# Patient Record
Sex: Male | Born: 1935 | Race: Black or African American | Hispanic: No | Smoking: Light tobacco smoker
Health system: Southern US, Community
[De-identification: ages and names within clinical notes are randomized; demographics above are authoritative.]

## PROBLEM LIST (undated history)

## (undated) DIAGNOSIS — I2510 Atherosclerotic heart disease of native coronary artery without angina pectoris: Secondary | ICD-10-CM

## (undated) DIAGNOSIS — IMO0002 Reserved for concepts with insufficient information to code with codable children: Secondary | ICD-10-CM

## (undated) DIAGNOSIS — R21 Rash and other nonspecific skin eruption: Secondary | ICD-10-CM

## (undated) DIAGNOSIS — H548 Legal blindness, as defined in USA: Secondary | ICD-10-CM

## (undated) DIAGNOSIS — F329 Major depressive disorder, single episode, unspecified: Secondary | ICD-10-CM

## (undated) DIAGNOSIS — H547 Unspecified visual loss: Secondary | ICD-10-CM

## (undated) DIAGNOSIS — F3289 Other specified depressive episodes: Secondary | ICD-10-CM

## (undated) DIAGNOSIS — R413 Other amnesia: Secondary | ICD-10-CM

## (undated) DIAGNOSIS — L723 Sebaceous cyst: Secondary | ICD-10-CM

## (undated) DIAGNOSIS — I10 Essential (primary) hypertension: Secondary | ICD-10-CM

## (undated) DIAGNOSIS — J4489 Other specified chronic obstructive pulmonary disease: Secondary | ICD-10-CM

## (undated) DIAGNOSIS — E1151 Type 2 diabetes mellitus with diabetic peripheral angiopathy without gangrene: Secondary | ICD-10-CM

## (undated) DIAGNOSIS — M2550 Pain in unspecified joint: Secondary | ICD-10-CM

## (undated) DIAGNOSIS — E559 Vitamin D deficiency, unspecified: Secondary | ICD-10-CM

## (undated) DIAGNOSIS — K2100 Gastro-esophageal reflux disease with esophagitis, without bleeding: Secondary | ICD-10-CM

## (undated) DIAGNOSIS — E785 Hyperlipidemia, unspecified: Secondary | ICD-10-CM

## (undated) DIAGNOSIS — R002 Palpitations: Secondary | ICD-10-CM

## (undated) DIAGNOSIS — J449 Chronic obstructive pulmonary disease, unspecified: Secondary | ICD-10-CM

## (undated) DIAGNOSIS — E1149 Type 2 diabetes mellitus with other diabetic neurological complication: Secondary | ICD-10-CM

## (undated) DIAGNOSIS — R05 Cough: Secondary | ICD-10-CM

## (undated) DIAGNOSIS — M48062 Spinal stenosis, lumbar region with neurogenic claudication: Secondary | ICD-10-CM

## (undated) DIAGNOSIS — R51 Headache: Secondary | ICD-10-CM

## (undated) DIAGNOSIS — K5900 Constipation, unspecified: Secondary | ICD-10-CM

## (undated) DIAGNOSIS — Z8719 Personal history of other diseases of the digestive system: Secondary | ICD-10-CM

## (undated) DIAGNOSIS — R059 Cough, unspecified: Secondary | ICD-10-CM

## (undated) DIAGNOSIS — F411 Generalized anxiety disorder: Secondary | ICD-10-CM

## (undated) DIAGNOSIS — E1165 Type 2 diabetes mellitus with hyperglycemia: Secondary | ICD-10-CM

## (undated) HISTORY — DX: Generalized anxiety disorder: F41.1

## (undated) HISTORY — DX: Essential (primary) hypertension: I10

## (undated) HISTORY — DX: Atherosclerotic heart disease of native coronary artery without angina pectoris: I25.10

## (undated) HISTORY — DX: Legal blindness, as defined in USA: H54.8

## (undated) HISTORY — DX: Rash and other nonspecific skin eruption: R21

## (undated) HISTORY — DX: Vitamin D deficiency, unspecified: E55.9

## (undated) HISTORY — DX: Hyperlipidemia, unspecified: E78.5

## (undated) HISTORY — DX: Cough, unspecified: R05.9

## (undated) HISTORY — DX: Other amnesia: R41.3

## (undated) HISTORY — DX: Type 2 diabetes mellitus with hyperglycemia: E11.65

## (undated) HISTORY — DX: Constipation, unspecified: K59.00

## (undated) HISTORY — DX: Gastro-esophageal reflux disease with esophagitis: K21.0

## (undated) HISTORY — DX: Other specified chronic obstructive pulmonary disease: J44.89

## (undated) HISTORY — DX: Cough: R05

## (undated) HISTORY — DX: Headache: R51

## (undated) HISTORY — DX: Palpitations: R00.2

## (undated) HISTORY — PX: EYE SURGERY: SHX253

## (undated) HISTORY — DX: Type 2 diabetes mellitus with diabetic peripheral angiopathy without gangrene: E11.51

## (undated) HISTORY — DX: Major depressive disorder, single episode, unspecified: F32.9

## (undated) HISTORY — DX: Spinal stenosis, lumbar region with neurogenic claudication: M48.062

## (undated) HISTORY — DX: Type 2 diabetes mellitus with other diabetic neurological complication: E11.49

## (undated) HISTORY — DX: Chronic obstructive pulmonary disease, unspecified: J44.9

## (undated) HISTORY — DX: Reserved for concepts with insufficient information to code with codable children: IMO0002

## (undated) HISTORY — PX: CORONARY ANGIOPLASTY WITH STENT PLACEMENT: SHX49

## (undated) HISTORY — PX: HERNIA REPAIR: SHX51

## (undated) HISTORY — DX: Other specified depressive episodes: F32.89

## (undated) HISTORY — DX: Gastro-esophageal reflux disease with esophagitis, without bleeding: K21.00

## (undated) HISTORY — DX: Pain in unspecified joint: M25.50

## (undated) HISTORY — DX: Sebaceous cyst: L72.3

---

## 1998-04-08 ENCOUNTER — Ambulatory Visit (HOSPITAL_COMMUNITY): Admission: RE | Admit: 1998-04-08 | Discharge: 1998-04-08 | Payer: Self-pay | Admitting: Cardiology

## 1998-04-09 ENCOUNTER — Ambulatory Visit (HOSPITAL_COMMUNITY): Admission: RE | Admit: 1998-04-09 | Discharge: 1998-04-09 | Payer: Self-pay | Admitting: Cardiology

## 1998-12-07 ENCOUNTER — Emergency Department (HOSPITAL_COMMUNITY): Admission: EM | Admit: 1998-12-07 | Discharge: 1998-12-07 | Payer: Self-pay | Admitting: Emergency Medicine

## 1998-12-23 ENCOUNTER — Ambulatory Visit (HOSPITAL_COMMUNITY): Admission: RE | Admit: 1998-12-23 | Discharge: 1998-12-23 | Payer: Self-pay | Admitting: Cardiology

## 1999-01-23 ENCOUNTER — Ambulatory Visit (HOSPITAL_COMMUNITY): Admission: RE | Admit: 1999-01-23 | Discharge: 1999-01-23 | Payer: Self-pay | Admitting: Cardiology

## 1999-01-23 ENCOUNTER — Encounter: Payer: Self-pay | Admitting: Cardiology

## 1999-09-09 ENCOUNTER — Encounter: Payer: Self-pay | Admitting: Emergency Medicine

## 1999-09-09 ENCOUNTER — Inpatient Hospital Stay (HOSPITAL_COMMUNITY): Admission: EM | Admit: 1999-09-09 | Discharge: 1999-09-12 | Payer: Self-pay | Admitting: Emergency Medicine

## 1999-09-12 ENCOUNTER — Encounter: Payer: Self-pay | Admitting: Cardiovascular Disease

## 1999-09-24 ENCOUNTER — Ambulatory Visit (HOSPITAL_COMMUNITY): Admission: RE | Admit: 1999-09-24 | Discharge: 1999-09-24 | Payer: Self-pay | Admitting: Cardiology

## 1999-09-24 ENCOUNTER — Encounter: Payer: Self-pay | Admitting: Cardiology

## 2000-04-29 ENCOUNTER — Emergency Department (HOSPITAL_COMMUNITY): Admission: EM | Admit: 2000-04-29 | Discharge: 2000-04-30 | Payer: Self-pay | Admitting: Emergency Medicine

## 2000-07-29 ENCOUNTER — Emergency Department (HOSPITAL_COMMUNITY): Admission: EM | Admit: 2000-07-29 | Discharge: 2000-07-29 | Payer: Self-pay | Admitting: Emergency Medicine

## 2000-10-27 ENCOUNTER — Ambulatory Visit (HOSPITAL_COMMUNITY): Admission: RE | Admit: 2000-10-27 | Discharge: 2000-10-27 | Payer: Self-pay | Admitting: Cardiology

## 2000-10-27 ENCOUNTER — Encounter: Payer: Self-pay | Admitting: Cardiology

## 2000-12-01 ENCOUNTER — Encounter: Payer: Self-pay | Admitting: Cardiology

## 2000-12-01 ENCOUNTER — Ambulatory Visit (HOSPITAL_COMMUNITY): Admission: RE | Admit: 2000-12-01 | Discharge: 2000-12-01 | Payer: Self-pay | Admitting: Cardiology

## 2000-12-03 ENCOUNTER — Encounter: Payer: Self-pay | Admitting: Cardiology

## 2000-12-03 ENCOUNTER — Encounter: Admission: RE | Admit: 2000-12-03 | Discharge: 2000-12-03 | Payer: Self-pay | Admitting: Cardiology

## 2001-02-16 ENCOUNTER — Ambulatory Visit (HOSPITAL_COMMUNITY): Admission: RE | Admit: 2001-02-16 | Discharge: 2001-02-16 | Payer: Self-pay | Admitting: Cardiology

## 2001-02-16 ENCOUNTER — Encounter: Payer: Self-pay | Admitting: Cardiology

## 2001-03-05 ENCOUNTER — Emergency Department (HOSPITAL_COMMUNITY): Admission: EM | Admit: 2001-03-05 | Discharge: 2001-03-05 | Payer: Self-pay | Admitting: Emergency Medicine

## 2001-05-04 ENCOUNTER — Ambulatory Visit (HOSPITAL_COMMUNITY): Admission: RE | Admit: 2001-05-04 | Discharge: 2001-05-04 | Payer: Self-pay | Admitting: Gastroenterology

## 2001-05-04 ENCOUNTER — Encounter (INDEPENDENT_AMBULATORY_CARE_PROVIDER_SITE_OTHER): Payer: Self-pay | Admitting: *Deleted

## 2001-05-30 ENCOUNTER — Emergency Department (HOSPITAL_COMMUNITY): Admission: EM | Admit: 2001-05-30 | Discharge: 2001-05-30 | Payer: Self-pay | Admitting: Emergency Medicine

## 2001-07-12 ENCOUNTER — Encounter: Admission: RE | Admit: 2001-07-12 | Discharge: 2001-07-12 | Payer: Self-pay | Admitting: Cardiology

## 2001-07-12 ENCOUNTER — Encounter: Payer: Self-pay | Admitting: Cardiology

## 2001-09-22 ENCOUNTER — Emergency Department (HOSPITAL_COMMUNITY): Admission: EM | Admit: 2001-09-22 | Discharge: 2001-09-22 | Payer: Self-pay | Admitting: Emergency Medicine

## 2001-09-22 ENCOUNTER — Encounter: Payer: Self-pay | Admitting: Emergency Medicine

## 2002-02-05 ENCOUNTER — Emergency Department (HOSPITAL_COMMUNITY): Admission: EM | Admit: 2002-02-05 | Discharge: 2002-02-05 | Payer: Self-pay

## 2002-09-09 ENCOUNTER — Encounter: Payer: Self-pay | Admitting: Emergency Medicine

## 2002-09-09 ENCOUNTER — Emergency Department (HOSPITAL_COMMUNITY): Admission: EM | Admit: 2002-09-09 | Discharge: 2002-09-09 | Payer: Self-pay | Admitting: Emergency Medicine

## 2003-04-23 ENCOUNTER — Encounter: Payer: Self-pay | Admitting: *Deleted

## 2003-04-23 ENCOUNTER — Ambulatory Visit (HOSPITAL_COMMUNITY): Admission: RE | Admit: 2003-04-23 | Discharge: 2003-04-23 | Payer: Self-pay | Admitting: *Deleted

## 2003-08-31 ENCOUNTER — Emergency Department (HOSPITAL_COMMUNITY): Admission: EM | Admit: 2003-08-31 | Discharge: 2003-09-01 | Payer: Self-pay | Admitting: Emergency Medicine

## 2003-09-01 ENCOUNTER — Encounter: Payer: Self-pay | Admitting: *Deleted

## 2003-12-22 ENCOUNTER — Emergency Department (HOSPITAL_COMMUNITY): Admission: EM | Admit: 2003-12-22 | Discharge: 2003-12-22 | Payer: Self-pay | Admitting: Emergency Medicine

## 2004-09-10 ENCOUNTER — Ambulatory Visit (HOSPITAL_COMMUNITY): Admission: RE | Admit: 2004-09-10 | Discharge: 2004-09-10 | Payer: Self-pay | Admitting: Gastroenterology

## 2004-10-18 ENCOUNTER — Emergency Department (HOSPITAL_COMMUNITY): Admission: EM | Admit: 2004-10-18 | Discharge: 2004-10-19 | Payer: Self-pay | Admitting: Emergency Medicine

## 2005-08-24 ENCOUNTER — Emergency Department (HOSPITAL_COMMUNITY): Admission: EM | Admit: 2005-08-24 | Discharge: 2005-08-24 | Payer: Self-pay | Admitting: Emergency Medicine

## 2007-02-18 ENCOUNTER — Ambulatory Visit (HOSPITAL_COMMUNITY): Admission: RE | Admit: 2007-02-18 | Discharge: 2007-02-18 | Payer: Self-pay | Admitting: Cardiology

## 2007-02-21 ENCOUNTER — Ambulatory Visit (HOSPITAL_COMMUNITY): Admission: RE | Admit: 2007-02-21 | Discharge: 2007-02-21 | Payer: Self-pay | Admitting: Cardiology

## 2008-02-01 ENCOUNTER — Encounter: Admission: RE | Admit: 2008-02-01 | Discharge: 2008-02-01 | Payer: Self-pay | Admitting: Cardiology

## 2008-04-27 ENCOUNTER — Ambulatory Visit (HOSPITAL_COMMUNITY): Admission: RE | Admit: 2008-04-27 | Discharge: 2008-04-27 | Payer: Self-pay | Admitting: Cardiology

## 2008-06-14 ENCOUNTER — Inpatient Hospital Stay (HOSPITAL_COMMUNITY): Admission: RE | Admit: 2008-06-14 | Discharge: 2008-06-15 | Payer: Self-pay | Admitting: *Deleted

## 2009-01-04 ENCOUNTER — Emergency Department (HOSPITAL_COMMUNITY): Admission: EM | Admit: 2009-01-04 | Discharge: 2009-01-04 | Payer: Self-pay | Admitting: *Deleted

## 2009-02-05 ENCOUNTER — Encounter: Admission: RE | Admit: 2009-02-05 | Discharge: 2009-02-05 | Payer: Self-pay | Admitting: Orthopedic Surgery

## 2009-04-05 ENCOUNTER — Encounter (HOSPITAL_COMMUNITY): Admission: RE | Admit: 2009-04-05 | Discharge: 2009-07-04 | Payer: Self-pay | Admitting: Cardiology

## 2009-12-09 LAB — HM COLONOSCOPY

## 2009-12-13 ENCOUNTER — Encounter (HOSPITAL_COMMUNITY): Admission: RE | Admit: 2009-12-13 | Discharge: 2010-03-14 | Payer: Self-pay | Admitting: Cardiology

## 2010-01-17 ENCOUNTER — Encounter: Admission: RE | Admit: 2010-01-17 | Discharge: 2010-01-17 | Payer: Self-pay | Admitting: Orthopedic Surgery

## 2010-08-22 ENCOUNTER — Emergency Department (HOSPITAL_COMMUNITY): Admission: EM | Admit: 2010-08-22 | Discharge: 2010-08-22 | Payer: Self-pay | Admitting: Emergency Medicine

## 2010-09-25 ENCOUNTER — Encounter: Admission: RE | Admit: 2010-09-25 | Discharge: 2010-09-25 | Payer: Self-pay | Admitting: Orthopedic Surgery

## 2010-12-28 ENCOUNTER — Encounter: Payer: Self-pay | Admitting: Cardiology

## 2011-03-18 LAB — LIPID PANEL
HDL: 31 mg/dL — ABNORMAL LOW (ref >39)
Triglycerides: 106 mg/dL (ref <150)
VLDL: 21 mg/dL (ref 0–40)

## 2011-03-18 LAB — CBC
HCT: 38.5 % — ABNORMAL LOW (ref 39.0–52.0)
Hemoglobin: 13.2 g/dL (ref 13.0–17.0)
MCHC: 34.2 g/dL (ref 30.0–36.0)
RBC: 4.27 MIL/uL (ref 4.22–5.81)

## 2011-03-18 LAB — HEPATIC FUNCTION PANEL
Albumin: 4 g/dL (ref 3.5–5.2)
Alkaline Phosphatase: 82 U/L (ref 39–117)
Bilirubin, Direct: <0.1 mg/dL (ref 0.0–0.3)
Total Bilirubin: 0.8 mg/dL (ref 0.3–1.2)

## 2011-03-18 LAB — BASIC METABOLIC PANEL
CO2: 29 mEq/L (ref 19–32)
Calcium: 9.7 mg/dL (ref 8.4–10.5)
Chloride: 103 mEq/L (ref 96–112)
GFR calc Af Amer: >60 mL/min (ref >60)
Potassium: 4.5 mEq/L (ref 3.5–5.1)
Sodium: 138 mEq/L (ref 135–145)

## 2011-03-18 LAB — PROTIME-INR: Prothrombin Time: 13.8 seconds (ref 11.6–15.2)

## 2011-03-18 LAB — HEMOGLOBIN A1C: Hgb A1c MFr Bld: 7.2 % — ABNORMAL HIGH (ref 4.6–6.1)

## 2011-03-23 LAB — POCT I-STAT, CHEM 8
Creatinine, Ser: 1 mg/dL (ref 0.4–1.5)
Glucose, Bld: 104 mg/dL — ABNORMAL HIGH (ref 70–99)
Hemoglobin: 13.3 g/dL (ref 13.0–17.0)
Sodium: 141 mEq/L (ref 135–145)
TCO2: 24 mmol/L (ref 0–100)

## 2011-03-23 LAB — URINALYSIS, ROUTINE W REFLEX MICROSCOPIC
Bilirubin Urine: NEGATIVE
Hgb urine dipstick: NEGATIVE
Protein, ur: NEGATIVE mg/dL
Urobilinogen, UA: 1 mg/dL (ref 0.0–1.0)

## 2011-04-07 ENCOUNTER — Observation Stay (HOSPITAL_COMMUNITY)
Admission: RE | Admit: 2011-04-07 | Discharge: 2011-04-08 | Disposition: A | Discharge status: Home / Self Care | Payer: Medicare Other | Source: Ambulatory Visit | Attending: Cardiology | Admitting: Cardiology

## 2011-04-07 DIAGNOSIS — R079 Chest pain, unspecified: Secondary | ICD-10-CM | POA: Insufficient documentation

## 2011-04-07 DIAGNOSIS — I2510 Atherosclerotic heart disease of native coronary artery without angina pectoris: Principal | ICD-10-CM | POA: Insufficient documentation

## 2011-04-07 DIAGNOSIS — E119 Type 2 diabetes mellitus without complications: Secondary | ICD-10-CM | POA: Insufficient documentation

## 2011-04-07 DIAGNOSIS — Z9861 Coronary angioplasty status: Secondary | ICD-10-CM | POA: Insufficient documentation

## 2011-04-07 DIAGNOSIS — H543 Unqualified visual loss, both eyes: Secondary | ICD-10-CM | POA: Insufficient documentation

## 2011-04-07 DIAGNOSIS — R0602 Shortness of breath: Secondary | ICD-10-CM | POA: Insufficient documentation

## 2011-04-07 DIAGNOSIS — I10 Essential (primary) hypertension: Secondary | ICD-10-CM | POA: Insufficient documentation

## 2011-04-07 DIAGNOSIS — E7800 Pure hypercholesterolemia, unspecified: Secondary | ICD-10-CM | POA: Insufficient documentation

## 2011-04-07 LAB — GLUCOSE, CAPILLARY: Glucose-Capillary: 180 mg/dL — ABNORMAL HIGH (ref 70–99)

## 2011-04-07 LAB — PLATELET INHIBITION P2Y12
P2Y12 % Inhibition: 15 %
Platelet Function  P2Y12: 306 [PRU] (ref 194–418)

## 2011-04-08 LAB — CBC
HCT: 36 % — ABNORMAL LOW (ref 39.0–52.0)
Hemoglobin: 11.9 g/dL — ABNORMAL LOW (ref 13.0–17.0)
MCH: 29.9 pg (ref 26.0–34.0)
MCHC: 33.1 g/dL (ref 30.0–36.0)

## 2011-04-08 LAB — BASIC METABOLIC PANEL
CO2: 23 mEq/L (ref 19–32)
Calcium: 9.5 mg/dL (ref 8.4–10.5)
Creatinine, Ser: 1.04 mg/dL (ref 0.4–1.5)
Glucose, Bld: 161 mg/dL — ABNORMAL HIGH (ref 70–99)
Sodium: 137 mEq/L (ref 135–145)

## 2011-04-09 NOTE — Cardiovascular Report (Signed)
NAME:  Eddie Graves, Eddie Graves              ACCOUNT NO.:  [id number]  MEDICAL RECORD NO.:  [id number]           PATIENT TYPE:  O  LOCATION:  6523                         FACILITY:  [facility]  PHYSICIAN:  Eldredge Veldhuizen N. Sharyn Lull, M.D. DATE OF BIRTH:  Oct 05, 1931  DATE OF PROCEDURE:  04/07/2011 DATE OF DISCHARGE:                           CARDIAC CATHETERIZATION   PROCEDURES: 1. Left cardiac catheterization with selective left and right coronary     angiography, left ventriculography via right groin using Judkins     technique. 2. Successful measurement of fractional flow reserve using [place]     PrimeWire. 3. Successful deployment of Xience Prime drug-eluting stent, 3.0 x 38     mm long in proximal and mid left anterior descending. 4. Successful closure of arteriotomy using ProGlide Perclose without     complications.  INDICATIONS FOR PROCEDURE:  Eddie Graves is a 77 year old black male with past medical history significant for coronary artery disease status post PTCA stenting to RCA in the past, hypertension, non-insulin-dependent diabetes mellitus, hypercholesteremia, and blindness.  The patient is fairly active and complains of retrosternal chest tightness off and on associated with diaphoresis with minimal exertion.  States lately feels tired, weak, associated with short of breath with minimal exertion. Denies any palpitation, lightheadedness, or syncope.  Denies PND, orthopnea, or leg swelling.  Denies relation of chest pain to food, breathing, or movement.  Due to typical anginal chest pain, multiple risk factors, and significant CAD in the past, discussed with the patient regarding left cath, possible PTCA and stenting, its risks and benefits, i.e., death, MI, stroke, need for emergency CABG, local vascular complications, etc., and consented for the procedure.  PROCEDURE:  After obtaining informed consent, the patient was brought to the Cath Lab and was placed on fluoroscopy table.  Right  groin was prepped and draped in the usual fashion.  Xylocaine 1% was used for local anesthesia in the right groin.  With the help of thin-wall needle, a 6-French arterial sheath was placed.  The sheath was aspirated and flushed.  Next, a 6-French left Judkins catheter was advanced over the wire under fluoroscopic guidance up to the ascending aorta.  Wire was pulled out.  The catheter was aspirated and connected to the manifold. Catheter was further advanced and engaged into left coronary ostium. Multiple views of the left system were taken.  Next, the catheter was disengaged and was pulled out over the wire and was replaced with 6- [place] right Judkins catheter which was advanced over the wire under fluoroscopic guidance up to the ascending aorta.  Wire was pulled out. The catheter was aspirated and connected to the manifold.  Catheter was further advanced and engaged into right coronary ostium.  Multiple views of the right system were taken.  Next, the catheter was disengaged and was pulled out over the wire and was replaced with 6-French pigtail catheter which was advanced over the wire under fluoroscopic guidance up to the ascending aorta.  Wire was pulled out.  The catheter was aspirated and connected to the manifold.  Catheter was further advanced across the aortic valve into the LV.  LV pressures  were recorded.  Next, LV-graphy was done in 30-degree RAO position.  Post-angiographic pressures were recorded from LV and then pullback pressures were recorded from the aorta.  There was no gradient across the aortic valve. Next, the pigtail catheter was pulled out over the wire.  Sheaths were aspirated and flushed.  FINDINGS:  LV showed good LV systolic function.  There was mild anterolateral wall hypokinesia.  EF of 55-60%.  Left main was patent. LAD has 60-70% proximal and mid sequential stenosis.  Diagonal 1 has 90- 95% proximal and ostial stenosis.  Vessel is less than 1.5 mm and  not suitable for PCI.  Diagonal 2 is very, very small.  Ramus has 50-60% proximal sequential stenosis.  Vessel is long, but is approximately 1.5 mm.  Left circumflex has 50-60% ostial stenosis which appears to be more prominent in RAO caudal view due to foreshortening.  OM-1 has 50-60% ostial stenosis with poststenotic dilatation.  RCA has 10-20% proximal in-stent restenosis and 50-60% mid stenosis.  Distal stent is patent. PDA is very small.  PLV branches are small which are diffusely diseased.  INTERVENTIONAL PROCEDURE:  Fractional flow reserve was done to evaluate the physiological significance of proximal and mid LAD lesion using [place] PrimeWire.  Fractional flow reserve was measured after giving 140 mcg/kg/minute of IV adenosine.  Fractional flow reserve was 0.75, which was significant for ischemia.  Then, successful PTCA to mid and proximal LAD was done using 2.5 x 12 mm long mini Trek balloon for predilatation and then attempted to deploy a 3.0 x 38 mm long Xience Prime drug-eluting stent without success as stent would not track down and then PTCA to mid LAD was done using 3.0 x 15 mm long noncompliant Trek balloon going up to 8 atmospheric pressure and then again attempted to deploy a stent without success as it would not track down beyond proximal portion.  Then using mailman 0.014 buddy wire, stent was easily tracked down up to mid LAD and was deployed at 10 atmospheric pressure. Attempted to post-dilate the stent using initially 3.0 x 20 mm long [place] Trek balloon and then 3.0 x 12 mm long Trek balloon and then 3.0 x 9 mm [place] Sprinter balloon without success and then again mailman 0.014 buddy wire was reinserted and then 3.0 x 9 mm long [place] Sprinter balloon was easily tracked down to mid LAD.  Multiple inflations were done going up to 18 atmospheric pressure.  Lesion dilated from 70% to 0% residual with excellent TIMI grade 3 distal flow without evidence of dissection or distal  embolization.  The patient received weight-based Angiomax and 600 mg of Plavix during the procedure.  Arteriotomy was closed using ProGlide Perclose with good hemostasis.  The patient had brief episode of vasovagal episode with bradycardia and hypotension requiring atropine and IV fluid and dopamine for few minutes.  Dopamine was discontinued after a few minutes.  The patient tolerated the procedure well.  There were no complications.  The patient was transferred to the recovery room in stable condition.     [name]. Sharyn Lull, M.D.     MNH/MEDQ  D:  04/07/2011  T:  04/08/2011  Job:  [id number]  cc:   [name], M.D.  Electronically Signed by Rinaldo Cloud M.D. on 04/09/2011 08:36:19 AM

## 2011-04-09 NOTE — Discharge Summary (Signed)
NAME:  Eddie Graves, Eddie Graves              ACCOUNT NO.:  [id number]  MEDICAL RECORD NO.:  [id number]           PATIENT TYPE:  O  LOCATION:  6523                         FACILITY:  [facility]  PHYSICIAN:  Danzig Macgregor N. Sharyn Lull, M.D. DATE OF BIRTH:  [birth year]  DATE OF ADMISSION:  04/07/2011 DATE OF DISCHARGE:  04/08/2011                              DISCHARGE SUMMARY   ADMITTING DIAGNOSES: 1. Accelerated angina. 2. Coronary artery disease. 3. History of percutaneous coronary intervention to right coronary     artery in the past. 4. Hypertension. 5. Non-insulin-dependent diabetes mellitus. 6. Hypercholesteremia. 7. Degenerative joint disease. 8. History of tobacco abuse. 9. Blindness.  DISCHARGE DIAGNOSES: 1. Accelerated angina, status post left Catheterization and     percutaneous transluminal coronary angioplasty stenting to proximal     and mid-left anterior descending artery. 2. Multivessel coronary artery disease. 3. History of percutaneous coronary intervention to right coronary     artery in the past with patent stents. 4. Hypertension. 5. Non-insulin-dependent diabetes mellitus. 6. Hypercholesteremia. 7. Degenerative joint disease. 8. History of tobacco abuse. 9. Blindness.  DISCHARGE MEDICATIONS: 1. Enteric-coated aspirin 325 mg one tablet daily. 2. Prasugrel 10 mg one tablet daily. 3. Crestor 20 mg one tablet daily. 4. Lisinopril 2.5 mg one tablet daily. 5. Toprol-XL 50 mg one tablet daily. 6. Nexium 40 mg one capsule daily. 7. Nitrostat 0.4 mg sublingual every 5 minutes use as directed. 8. Actoplus Met 15/850 one tablet daily as before, which will be     started from tomorrow. 9. Garlic capsule one capsule daily as before. 10.Hydrocodone/APAP 10/650 mg one tablet every 8 hours as needed for     pain. 11.Multivitamin one tablet daily as before.  DIET:  Low-salt, low-cholesterol, 1800 calories ADA diet.  The patient has been advised to monitor blood sugar and blood  pressure daily.  Post- PCI instructions have been given.  Follow up with me in 1 week.  CONDITION AT DISCHARGE:  Stable.  BRIEF HISTORY AND HOSPITAL COURSE:  Eddie Graves is a 77 year old black male with past medical history significant for coronary artery disease, status post PTCA and stenting to RCA in the past; hypertension; non- insulin-dependent diabetes mellitus; hypercholesteremia; blindness.  The patient is fairly active, complains of retrosternal chest tightness off and on associated with diaphoresis with minimal exertion.  States lately feels tired and weak associated with shortness of breath with minimal exertion.  Denies any palpitation, lightheadedness, or syncope.  Denies PND, orthopnea, or leg swelling.  Denies relation of chest pain to food, breathing, or movement.  PAST MEDICAL HISTORY:  As above.  PAST SURGICAL HISTORY:  He had umbilical hernia repair in the past.  Had glaucoma surgery in the past.  ALLERGIES:  No known drug allergies.  MEDICATION AT HOME: 1. Enteric-coated aspirin 325 mg one tablet daily. 2. Plavix 75 mg daily. 3. Crestor 20 mg one tablet daily. 4. Lisinopril 2.5 mg one tablet daily. 5. Toprol-XL 50 mg one tablet daily. 6. Nexium 40 mg one capsule daily. 7. Nitrostat 0.4 mg sublingual every 5 minutes use as directed. 8. Actoplus Met 15/850 one tablet daily as before, which  will be     started from tomorrow. 9. Garlic capsule one capsule daily as before. 10.Hydrocodone/APAP 10/650 mg one tablet every 8 hours as needed for     pain. 11.Multivitamin one tablet daily as before.  SOCIAL HISTORY:  He is widowed.  Smoked one-pack per day for 30+ years. No history of alcohol abuse.  FAMILY HISTORY:  Noncontributory.  PHYSICAL EXAMINATION:  GENERAL:  He was alert, wake and oriented x3 in no acute distress. VITAL SIGNS:  Blood pressure was 140/80, pulse was 82 and regular. HEENT:  Conjunctivae was pink. NECK:  Supple.  No JVD.  No bruit. LUNGS:   Clear to auscultation without rhonchi or rales. CARDIOVASCULAR:  S1-S2 was normal.  There was soft systolic murmur. There was no S3 gallop. ABDOMEN:  Soft.  Bowel sounds were present and nontender. EXTREMITIES:  There was no clubbing, cyanosis, or edema.  IMPRESSION: 1. Accelerated angina, rule out progression of coronary artery     disease. 2. Coronary artery disease. 3. History of percutaneous coronary intervention to right coronary     artery in the past. 4. Hypertension. 5. Non-insulin-dependent diabetes mellitus. 6. Hypercholesteremia. 7. History of tobacco abuse. 8. Blindness.  BRIEF HOSPITAL COURSE:  The patient was a.m. admit and underwent left cardiac cath with selective left and right coronary angiography and PTCA and stenting to proximal and mid-LAD prior to PCI.  The patient underwent also fractional flow reserve for evaluation of borderline proximal and mid-LAD lesion, which was physiologically significant by FFR with value of 0.75.  Subsequently, the patient underwent PCI to proximal and mid-LAD as per procedure report.  The patient tolerated the procedure well.  There were no complications.  Postprocedure, the patient did not have any chest pain during the hospital stay.  His groin is stable with no evidence of hematoma or bruit.  Phase I cardiac rehab was called.  The patient has ambulated in hallway earlier today without any chest pain.  The patient's postprocedure cardiac enzymes were normal.  His platelet inhibition is still low at 15% and PRU value also is elevated at 306, which is Plavix to Effient and the patient will be discharged home today.  The patient will be scheduled for phase II cardiac rehab as an outpatient.     [name]. Sharyn Lull, M.D.     MNH/MEDQ  D:  04/08/2011  T:  04/09/2011  Job:  [id number]  Electronically Signed by Rinaldo Cloud M.D. on 04/09/2011 08:36:25 AM

## 2011-04-21 NOTE — Discharge Summary (Signed)
NAME:  Eddie Graves, Eddie Graves NO.:  [id number]   MEDICAL RECORD NO.:  [id number]          PATIENT TYPE:  INP   LOCATION:  6527                         FACILITY:  [facility]   PHYSICIAN:  Mohan N. Sharyn Lull, M.D. DATE OF BIRTH:  June 21, 1931   DATE OF ADMISSION:  06/14/2008  DATE OF DISCHARGE:  06/15/2008                               DISCHARGE SUMMARY   ADMITTING DIAGNOSES:  1. New onset angina.  2. Positive Persantine Myoview.  3. Coronary artery disease.  4. Hypertension.  5. Diabetes mellitus.  6. Hypercholesteremia.  7. Blindness.   FINAL DIAGNOSES:  1. New onset angina status post left cath and percutaneous      transluminal coronary angioplasty stenting to right coronary      artery.  2. Hypertension.  3. Coronary artery disease.  4. Diabetes mellitus.  5. Hypercholesteremia.  6. Blindness.   DISCHARGE HOME MEDICATIONS:  1. Enteric-coated aspirin 325 mg 1 tablet daily.  2. Plavix 75 mg 1 tablet daily.  3. Toprol-XL 50 mg 1 tablet daily.  4. Crestor 20 mg 1 tablet daily.  5. Niaspan 500 mg 1 tablet daily at night.  6. Avandia 8 mg 1 tablet daily.  7. Lotensin 5 mg 1 tablet daily.  8. Nitrostat 0.4 mg sublingual use as directed.   Post PTCA stent instructions have been given.  Increase activity slowly.   DIET:  Low salt, low cholesterol 1800 calories ADA diet.   Follow-up with me in 1 week and Dr. Shana Chute in 2 weeks.   CONDITION AT DISCHARGE:  Stable.   BRIEF HISTORY AND HOSPITAL COURSE:  Eddie Graves is a 77 year old black  male with past medical history significant for coronary artery disease  status post PTCA in the past, hypertension, and diabetes mellitus  complains of retrosternal chest pain associated with tired feeling in  the arms relieved with rest in few minutes.  Denies any nausea, vomiting  and diaphoresis.  Denies palpitation, lightheadedness or syncope.  Denies PND, orthopnea, and leg swelling.  The patient underwent  Persantine Myoview on  Apr 27, 2008, which showed possible mild anterior  wall ischemia with EF of 64%.   PAST MEDICAL HISTORY:  As above.   PAST SURGICAL HISTORY:  He had umbilical hernia repair, had eye surgery  and glaucoma surgery in the past.   ALLERGIES:  No known drug allergies.   MEDICATION AT HOME:  1. Avandia 8 mg daily.  2. Lopressor 25 mg p.o. daily.  3. Aspirin 81 mg p.o. daily.  4. Lyrica 100 mg p.o. daily.  5. Valium occasionally p.r.n.   SOCIAL HISTORY:  He is widowed and retired.  Smokes one-pack per day for  30+ years and now smokes three to four cigarettes per day which he  intends to quit.  No history of alcohol abuse.   FAMILY HISTORY:  Noncontributory.   PHYSICAL EXAMINATION:  GENERAL:  He is alert, awake and oriented x3.  VITAL SIGNS:  Blood pressure was 150/86, pulse was 70 and regular.  Conjunctivae was pink.  NECK:  Supple, no JVD, no bruit.  LUNGS:  Clear  to auscultation without rhonchi or rales.  CARDIOVASCULAR:  S1 and S2 was normal.  There was a soft systolic  murmur.  ABDOMEN:  Soft.  Bowel sounds were present, nontender.  EXTREMITIES:  There is no clubbing, cyanosis or edema.   POSTPROCEDURE LABS:  CPK was 137, MB 1.7, potassium is 3.9, BUN 14,  creatinine 1.10, glucose was 111, hemoglobin A1c was 6.6.  Cholesterol  was 193, LDL 115, HDL 22, hemoglobin was 12.4, hematocrit 35.9, and  white count 6.5.   BRIEF HOSPITAL COURSE:  The patient was a.m. admit and underwent left  cath and PTCA stenting to proximal and distal RCA and ostial PDA as per  procedure report.  The patient tolerated the procedure well and had no  complications.  The patient was transferred to recovery room in stable  condition.  The patient did not had any episodes of chest pain during  the hospital stay.  Phase one cardiac rehab was called.  The patient has  been  ambulating in the hallway without any problems.  States he feels better  after the procedure.  His groin is stable with no evidence  of hematoma  or bruit.  The patient will be discharged home on above medications and  will be followed up with me in 1 week and Dr. Shana Chute thereafter in 2  weeks.      [name]. Sharyn Lull, M.D.  Electronically Signed     MNH/MEDQ  D:  06/15/2008  T:  06/16/2008  Job:  [id number]   cc:   [name], M.D.

## 2011-04-21 NOTE — Cardiovascular Report (Signed)
NAME:  JOURNEY, RATTERMAN NO.:  [id number]   MEDICAL RECORD NO.:  [id number]          PATIENT TYPE:  OIB   LOCATION:  6527                         FACILITY:  [facility]   PHYSICIAN:  Mohan N. Sharyn Lull, M.D. DATE OF BIRTH:  [birth year]   DATE OF PROCEDURE:  06/14/2008  DATE OF DISCHARGE:                            CARDIAC CATHETERIZATION   PROCEDURE:  1. Left cardiac cath with selective left and right coronary      angiography, LV graft via right groin using Judkins technique.  2. Successful PTCA to distal RCA and PDA using 1.5 x 12-mm long      Sprinter balloon.  3. Successful PTCA to distal RCA using 2.5 x 12-mm long Voyager      balloon.  4. Successful deployment of 2.5 x 28-mm long PROMUS drug-eluting stent      in distal RCA.  5. Successful PTCA to proximal RCA using 2.5 x 8-mm long Voyager      balloon.  6. Successful deployment of 2.75 x 18-mm long PROMUS drug-eluting      stent in proximal RCA.  7. Successful post dilatation of the stent using 3.0 x 12-mm long [place]      Voyager balloon.   INDICATIONS FOR PROCEDURE:  [name] is a 77 year old black male with  past medical history significant for coronary artery disease, history of  PTCA in the past, hypertension, and diabetes mellitus.  He complains of  retrosternal chest pain associated with tired feeling in the arm,  relieves with rest in few minutes.  He denies any nausea, vomiting, or  diaphoresis.  He denies palpitation, lightheadedness, or syncope.  He  denies PND, orthopnea, or leg swelling.  The patient underwent  Persantine Myoview on Apr 27, 2008 which showed mild possible anterior  wall ischemia with EF of 64%.  Due to typical anginal chest pains and  multiple risk factors and significant prior history of coronary artery  disease and mildly abnormal Persantine Myoview, the patient was referred  for cath and possible PTCA.   PROCEDURE:  After obtaining informed consent, the patient was brought to  the  cath lab and was placed on the fluoroscopy table.  The right groin  was prepped and draped in the usual fashion.  Xylocaine 2% was used for  local anesthesia in the right groin.  With the help of thin-wall needle,  6-French arterial sheath was placed.  The sheath was aspirated and  flushed.  Next, 6-French left Judkins catheter which was advanced over  the wire under fluoroscopic guidance up to the ascending aorta.  Wire  was pulled out, the catheter was aspirated, and connected to the  manifold.  Catheter was further advanced and engaged into left coronary  ostium.  Multiple views of the left system were taken.  Next, the  catheter was disengaged and was pulled out over the wire and was  replaced with 6-French right Judkins catheter which was advanced over  the wire under fluoroscopic guidance up to the ascending aorta.  Wire  was pulled out and the catheter was aspirated and connected to the  manifold.  Catheter was further advanced and engaged into right coronary  ostium.  Multiple views of the right system were taken.  Next, catheter  was disengaged and was pulled out over the wire and was replaced with 6-  French pigtail catheter which was advanced over the wire under  fluoroscopic guidance up to the ascending aorta.  Catheter was further  advanced across the aortic valve into the LV.  LV pressures were  recorded.  Next, LV graft was done in 30-degree RAO position.  Post  angiographic pressures were recorded from LV and then pullback pressures  were recorded from the aorta.  There was no gradient across the aortic  valve.  Next, a pigtail catheter was pulled out over the wire.  Sheaths  were aspirated and flushed.   FINDINGS:  LV showed inferobasal wall hypokinesia, EF of 55%-60%, and  left main was long and has 10%-15% distal stenosis.  LAD has 50%-60%  proximal and mid stenosis and 65%-70% focal smooth distal stenosis.  Diagonal 1 is very very small which is less than 1.5 mm which  is  diffusely diseased, not suitable for PCI.  Diagonal 2 is very very  small.  Ramus has 60%-65% proximal sequential stenosis.  The vessel is  small.  Left circumflex has 60% ostial and proximal stenosis which  appears to be more tighter in RAO caudal view but appears to be  approximately 60% in LAO caudal view.  OM 1 is patent.  OM 2 and 3 were  very very small.  RCA has 80%-85% focal proximal stenosis and 30%-40%  mid sequential stenosis and 99% distal subtotal stenosis with TIMI grade  2 distal flow.  PDA is very, very small.   INTERVENTIONAL PROCEDURE:  Successful PTCA to distal RCA and PDA was  done initially using 1.5 x 12-mm long Sprinter balloon and then using  2.5 x 8 mm long Voyager balloon in distal RCA for predilatation and then  2.5 x 28-mm long PROMUS drug-eluting stent was deployed at 13  atmospheric pressure and distal RCA stent was postdilated using 2.75 x  15-mm long [place] Voyager balloon going up to 18 atmospheric pressures.  Lesion dilated from 99% to 0% residual with excellent TIMI grade 3  distal flow without evidence of dissection or distal embolization.  Then, successful PTCA to proximal RCA was done using 2.5 x 8-mm long  Voyager balloon for predilatation and then 2.75 x 18-mm long PROMUS drug-  eluting stent was deployed in proximal RCA at 13 atmospheric pressures.  Stent was postdilated using 3.0 x 12-mm long [place] Voyager balloon going up  to 18 atmospheric pressure.  Lesion was dilated from 80%-85% to 0%  residual with excellent TIMI grade 3 distal flow without evidence of  dissection or distal embolization.  The patient received weight-based  Angiomax and 600 mg total of Plavix during the procedure.  The patient  tolerated the procedure well.  The patient was transferred to recovery  room in stable condition.      [name]. Sharyn Lull, M.D.  Electronically Signed     MNH/MEDQ  D:  06/14/2008  T:  06/15/2008  Job:  [id number]   cc:   [name], M.D.   Cath Lab

## 2011-04-24 NOTE — Op Note (Signed)
NAME:  Eddie Graves, Eddie Graves NO.:  [id number]   MEDICAL RECORD NO.:  [id number]          PATIENT TYPE:  AMB   LOCATION:  ENDO                         FACILITY:  [facility]   PHYSICIAN:  Bernette Redbird, M.D.   DATE OF BIRTH:  [birth year]   DATE OF PROCEDURE:  09/10/2004  DATE OF DISCHARGE:                                 OPERATIVE REPORT   PROCEDURE:  Colonoscopy.   ENDOSCOPIST:  Bernette Redbird, M.D.   INDICATION:  Followup of a 1-cm colonic adenoma removed 3 years ago.   FINDINGS:  No recurrent polyps identified.   PROCEDURE:  The nature, purpose and risks of the procedure were familiar to  the patient from prior examination and he provided written consent.  Sedation was fentanyl 85 mcg and Versed 8.5 mg IV prior to the procedure,  without arrhythmias or desaturation.  Digital exam of the prostate was  unremarkable.  The Olympus adult video colonoscope was easily advanced to  the cecum and pullback was then performed.   This was an unremarkable exam.  The quality of the prep was very good and it  was felt that all areas were well-seen.   I believe the patient had some scattered diverticulosis, but no polyps,  cancer, colitis or vascular malformations were seen.  Retroflexion in the  rectum and reinspection of the rectum were normal.  No biopsies were  obtained.  The patient tolerated the procedure well and there were no  apparent complications.   IMPRESSION:  Negative surveillance exam in a patient with prior history of  colonic adenomas.  Mild sigmoid diverticulosis (V12.72).   PLAN:  Followup colonoscopy in 5 years if the patient remains in good  general health in the interim.       RB/MEDQ  D:  09/10/2004  T:  09/10/2004  Job:  [id number]   cc:   [name], M.D.  [address] [address]  [place]  [place] [address]  Fax: [phone]

## 2011-04-24 NOTE — Procedures (Signed)
[place]  Patient:    Eddie Graves, Eddie Graves                          MRN: [id number] Proc. Date: 05/04/01 Adm. Date:  [id number] Attending:  Rich Brave CC:         [name], M.D.   Procedure Report  PROCEDURE:  Colonoscopy with polypectomy.  INDICATIONS:  A 77 year old African-American male with prior history of colonic adenoma having been removed, now approximately six years status post his most recent surveillance examination, at which time the exam was free of polyps.  FINDINGS:  A 1 cm polyp removed from the proximal colon.  DESCRIPTION OF PROCEDURE:  The nature, purpose, and risks of the procedure were familiar to the patient from prior examination.  Prior to the procedure, I offered the patient an opportunity to ask any additional questions, but he had none.  Written consent was provided.  Sedation was fentanyl 75 mcg and Versed 7 mg IV without arrhythmias or desaturation.  The Olympus adult video colonoscope was quite easily advanced to the cecum, and the tip was nubbed into the orifice of the terminal ileum, which had a normal mucosal appearance.  Pullback was then performed.  The quality of the prep was excellent, and it is felt that all areas were well-seen.  A short distance up the cecum was a 1 cm polyp on a very short stalk, which was snared off after injection with epinephrine at the base (0.5 cc of 1:10,000 epinephrine).  There was complete hemostasis and no evidence of excessive cautery.  The polyp was suctioned through the scope, fragmenting as it went through.  There was a 2 mm sessile polyp removed by a single cold biopsy at about 40 cm from the external anal opening.  The was mild left-sided diverticulosis.  I saw no evidence of cancer, colitis, or vascular malformations.  Retroflexion was unremarkable.  The patient tolerated the procedure well, and there were no apparent complications.  IMPRESSION:   Medium-sized and diminutive colon polyps, removed as described above.  Mild diverticulosis.  PLAN:  Await pathology on the polyp.  Follow-up colonoscopy in three years if it is adenomatous in character, otherwise in five years if the patient remains in good clinical general overall medical health. DD:  05/04/01 TD:  05/04/01 Job: [id number]

## 2011-06-09 ENCOUNTER — Emergency Department (HOSPITAL_COMMUNITY): Payer: Medicare Other

## 2011-06-09 ENCOUNTER — Emergency Department (HOSPITAL_COMMUNITY)
Admission: EM | Admit: 2011-06-09 | Discharge: 2011-06-09 | Disposition: A | Discharge status: Home / Self Care | Payer: Medicare Other | Attending: Emergency Medicine | Admitting: Emergency Medicine

## 2011-06-09 DIAGNOSIS — I2510 Atherosclerotic heart disease of native coronary artery without angina pectoris: Secondary | ICD-10-CM | POA: Insufficient documentation

## 2011-06-09 DIAGNOSIS — R059 Cough, unspecified: Secondary | ICD-10-CM | POA: Insufficient documentation

## 2011-06-09 DIAGNOSIS — I10 Essential (primary) hypertension: Secondary | ICD-10-CM | POA: Insufficient documentation

## 2011-06-09 DIAGNOSIS — F17200 Nicotine dependence, unspecified, uncomplicated: Secondary | ICD-10-CM | POA: Insufficient documentation

## 2011-06-09 DIAGNOSIS — E119 Type 2 diabetes mellitus without complications: Secondary | ICD-10-CM | POA: Insufficient documentation

## 2011-06-09 DIAGNOSIS — J189 Pneumonia, unspecified organism: Secondary | ICD-10-CM | POA: Insufficient documentation

## 2011-06-09 DIAGNOSIS — R05 Cough: Secondary | ICD-10-CM | POA: Insufficient documentation

## 2011-06-09 LAB — CBC
HCT: 36.6 % — ABNORMAL LOW (ref 39.0–52.0)
Hemoglobin: 12 g/dL — ABNORMAL LOW (ref 13.0–17.0)
MCH: 29.6 pg (ref 26.0–34.0)
MCV: 90.4 fL (ref 78.0–100.0)
Platelets: 165 10*3/uL (ref 150–400)
RBC: 4.05 MIL/uL — ABNORMAL LOW (ref 4.22–5.81)

## 2011-06-09 LAB — BASIC METABOLIC PANEL
CO2: 27 mEq/L (ref 19–32)
Calcium: 9.3 mg/dL (ref 8.4–10.5)
Chloride: 102 mEq/L (ref 96–112)
Sodium: 137 mEq/L (ref 135–145)

## 2011-06-09 LAB — DIFFERENTIAL
Eosinophils Absolute: 0.2 10*3/uL (ref 0.0–0.7)
Lymphocytes Relative: 34 % (ref 12–46)
Lymphs Abs: 2.1 10*3/uL (ref 0.7–4.0)
Monocytes Relative: 12 % (ref 3–12)
Neutrophils Relative %: 51 % (ref 43–77)

## 2011-06-09 LAB — TROPONIN I: Troponin I: <0.3 ng/mL (ref <0.30)

## 2011-07-12 ENCOUNTER — Emergency Department (HOSPITAL_COMMUNITY): Payer: Medicare Other

## 2011-07-12 ENCOUNTER — Emergency Department (HOSPITAL_COMMUNITY)
Admission: EM | Admit: 2011-07-12 | Discharge: 2011-07-12 | Disposition: A | Discharge status: Home / Self Care | Payer: Medicare Other | Attending: Emergency Medicine | Admitting: Emergency Medicine

## 2011-07-12 DIAGNOSIS — M79609 Pain in unspecified limb: Secondary | ICD-10-CM | POA: Insufficient documentation

## 2011-07-12 DIAGNOSIS — I10 Essential (primary) hypertension: Secondary | ICD-10-CM | POA: Insufficient documentation

## 2011-07-12 DIAGNOSIS — IMO0002 Reserved for concepts with insufficient information to code with codable children: Secondary | ICD-10-CM | POA: Insufficient documentation

## 2011-07-12 DIAGNOSIS — M25559 Pain in unspecified hip: Secondary | ICD-10-CM | POA: Insufficient documentation

## 2011-07-12 DIAGNOSIS — M533 Sacrococcygeal disorders, not elsewhere classified: Secondary | ICD-10-CM | POA: Insufficient documentation

## 2011-07-12 DIAGNOSIS — M1990 Unspecified osteoarthritis, unspecified site: Secondary | ICD-10-CM | POA: Insufficient documentation

## 2011-07-12 DIAGNOSIS — Z79899 Other long term (current) drug therapy: Secondary | ICD-10-CM | POA: Insufficient documentation

## 2011-07-12 DIAGNOSIS — M4802 Spinal stenosis, cervical region: Secondary | ICD-10-CM | POA: Insufficient documentation

## 2011-07-12 DIAGNOSIS — M545 Low back pain, unspecified: Secondary | ICD-10-CM | POA: Insufficient documentation

## 2011-07-12 DIAGNOSIS — M542 Cervicalgia: Secondary | ICD-10-CM | POA: Insufficient documentation

## 2011-07-12 DIAGNOSIS — M5430 Sciatica, unspecified side: Secondary | ICD-10-CM | POA: Insufficient documentation

## 2011-07-12 DIAGNOSIS — I2510 Atherosclerotic heart disease of native coronary artery without angina pectoris: Secondary | ICD-10-CM | POA: Insufficient documentation

## 2011-09-03 LAB — LIPID PANEL
Cholesterol: 193
LDL Cholesterol: 115 — ABNORMAL HIGH

## 2011-09-03 LAB — CBC
Hemoglobin: 12.4 — ABNORMAL LOW
MCHC: 34.5
RBC: 3.99 — ABNORMAL LOW
WBC: 6.5

## 2011-09-03 LAB — BASIC METABOLIC PANEL
CO2: 25
Calcium: 9
Chloride: 105
GFR calc Af Amer: >60
Sodium: 136

## 2011-09-03 LAB — DIFFERENTIAL
Basophils Absolute: 0
Lymphocytes Relative: 28
Monocytes Absolute: 0.7
Neutro Abs: 3.8
Neutrophils Relative %: 59

## 2011-09-03 LAB — HEMOGLOBIN A1C
Hgb A1c MFr Bld: 6.6 — ABNORMAL HIGH
Mean Plasma Glucose: 158

## 2011-09-03 LAB — CK TOTAL AND CKMB (NOT AT ARMC): CK, MB: 1.7

## 2011-09-18 ENCOUNTER — Other Ambulatory Visit: Payer: Self-pay | Admitting: Orthopedic Surgery

## 2011-09-18 ENCOUNTER — Ambulatory Visit
Admission: RE | Admit: 2011-09-18 | Discharge: 2011-09-18 | Disposition: A | Discharge status: Home / Self Care | Payer: Medicare Other | Source: Ambulatory Visit | Attending: Orthopedic Surgery | Admitting: Orthopedic Surgery

## 2011-09-18 DIAGNOSIS — M545 Low back pain: Secondary | ICD-10-CM

## 2011-11-02 ENCOUNTER — Other Ambulatory Visit: Payer: Self-pay | Admitting: Cardiology

## 2011-12-03 ENCOUNTER — Other Ambulatory Visit: Payer: Self-pay | Admitting: Cardiology

## 2011-12-29 ENCOUNTER — Other Ambulatory Visit: Payer: Self-pay | Admitting: Orthopedic Surgery

## 2011-12-29 DIAGNOSIS — M545 Low back pain: Secondary | ICD-10-CM

## 2011-12-31 ENCOUNTER — Ambulatory Visit
Admission: RE | Admit: 2011-12-31 | Discharge: 2011-12-31 | Disposition: A | Discharge status: Home / Self Care | Payer: Medicare Other | Source: Ambulatory Visit | Attending: Internal Medicine | Admitting: Internal Medicine

## 2011-12-31 ENCOUNTER — Other Ambulatory Visit: Payer: Self-pay | Admitting: Internal Medicine

## 2011-12-31 DIAGNOSIS — R05 Cough: Secondary | ICD-10-CM

## 2012-01-04 ENCOUNTER — Inpatient Hospital Stay: Admission: RE | Admit: 2012-01-04 | Payer: Medicare Other | Source: Ambulatory Visit

## 2012-01-16 ENCOUNTER — Ambulatory Visit
Admission: RE | Admit: 2012-01-16 | Discharge: 2012-01-16 | Disposition: A | Discharge status: Home / Self Care | Payer: Medicare Other | Source: Ambulatory Visit | Attending: Orthopedic Surgery | Admitting: Orthopedic Surgery

## 2012-01-16 DIAGNOSIS — M545 Low back pain: Secondary | ICD-10-CM

## 2012-02-28 ENCOUNTER — Emergency Department (HOSPITAL_COMMUNITY)
Admission: EM | Admit: 2012-02-28 | Discharge: 2012-02-28 | Disposition: A | Discharge status: Home / Self Care | Payer: Medicare Other | Attending: Emergency Medicine | Admitting: Emergency Medicine

## 2012-02-28 ENCOUNTER — Encounter (HOSPITAL_COMMUNITY): Payer: Self-pay

## 2012-02-28 ENCOUNTER — Emergency Department (HOSPITAL_COMMUNITY): Payer: Medicare Other

## 2012-02-28 ENCOUNTER — Other Ambulatory Visit: Payer: Self-pay

## 2012-02-28 DIAGNOSIS — Z9861 Coronary angioplasty status: Secondary | ICD-10-CM | POA: Insufficient documentation

## 2012-02-28 DIAGNOSIS — R61 Generalized hyperhidrosis: Secondary | ICD-10-CM | POA: Insufficient documentation

## 2012-02-28 DIAGNOSIS — Z79899 Other long term (current) drug therapy: Secondary | ICD-10-CM | POA: Insufficient documentation

## 2012-02-28 DIAGNOSIS — E162 Hypoglycemia, unspecified: Secondary | ICD-10-CM

## 2012-02-28 DIAGNOSIS — F17200 Nicotine dependence, unspecified, uncomplicated: Secondary | ICD-10-CM | POA: Insufficient documentation

## 2012-02-28 DIAGNOSIS — R42 Dizziness and giddiness: Secondary | ICD-10-CM | POA: Insufficient documentation

## 2012-02-28 DIAGNOSIS — D649 Anemia, unspecified: Secondary | ICD-10-CM | POA: Insufficient documentation

## 2012-02-28 DIAGNOSIS — E1169 Type 2 diabetes mellitus with other specified complication: Secondary | ICD-10-CM | POA: Insufficient documentation

## 2012-02-28 DIAGNOSIS — Z7982 Long term (current) use of aspirin: Secondary | ICD-10-CM | POA: Insufficient documentation

## 2012-02-28 DIAGNOSIS — Z9889 Other specified postprocedural states: Secondary | ICD-10-CM | POA: Insufficient documentation

## 2012-02-28 HISTORY — DX: Unspecified visual loss: H54.7

## 2012-02-28 LAB — URINALYSIS, ROUTINE W REFLEX MICROSCOPIC
Leukocytes, UA: NEGATIVE
Nitrite: NEGATIVE
Protein, ur: NEGATIVE mg/dL
Specific Gravity, Urine: 1.027 (ref 1.005–1.030)
Urobilinogen, UA: 0.2 mg/dL (ref 0.0–1.0)

## 2012-02-28 LAB — POCT I-STAT TROPONIN I: Troponin i, poc: 0.01 ng/mL (ref 0.00–0.08)

## 2012-02-28 LAB — COMPREHENSIVE METABOLIC PANEL
CO2: 27 mEq/L (ref 19–32)
Calcium: 10.2 mg/dL (ref 8.4–10.5)
Chloride: 106 mEq/L (ref 96–112)
Creatinine, Ser: 1.08 mg/dL (ref 0.50–1.35)
GFR calc Af Amer: 74 mL/min — ABNORMAL LOW (ref >90)
GFR calc non Af Amer: 64 mL/min — ABNORMAL LOW (ref >90)
Glucose, Bld: 140 mg/dL — ABNORMAL HIGH (ref 70–99)
Total Bilirubin: 0.5 mg/dL (ref 0.3–1.2)

## 2012-02-28 LAB — CBC
HCT: 40.7 % (ref 39.0–52.0)
Hemoglobin: 13.3 g/dL (ref 13.0–17.0)
MCH: 30.1 pg (ref 26.0–34.0)
MCV: 92.1 fL (ref 78.0–100.0)
RBC: 4.42 MIL/uL (ref 4.22–5.81)
WBC: 6 10*3/uL (ref 4.0–10.5)

## 2012-02-28 NOTE — ED Notes (Signed)
Pt unable to void at the time. Resting quietly. Vital signs stable. Denies pain. He remains alert and oriented x4. No signs of distress noted at present.

## 2012-02-28 NOTE — ED Notes (Signed)
[phone] [name]) cell

## 2012-02-28 NOTE — ED Notes (Signed)
Pt discharged home. Vital signs stable. Had no further questions.  

## 2012-02-28 NOTE — ED Notes (Signed)
Pt resting quietly at the time. Remains on cardiac monitor. Vital signs stable. Pt is alert and oriented x4. Denies pain. 

## 2012-02-28 NOTE — ED Notes (Signed)
Daughter reports tongue was out and pt was shaking, gave orange juice, and he was better

## 2012-02-28 NOTE — ED Provider Notes (Signed)
History     CSN: [id number]  Arrival date & time 02/28/12  4098   First MD Initiated Contact with Patient 02/28/12 [phone]      Chief Complaint  Patient presents with  . Hypoglycemia    (Consider location/radiation/quality/duration/timing/severity/associated sxs/prior treatment) HPI Pt presents after episode of nausea, shakiness, sweating and generalized weakness.  Pt was in church when this occurred and he was given orange juice which quickly helped to resolve his symptoms prior to EMS arrival.  Pt denies having chest pain, no LOC, no difficulty breathing.  Pt and family state that similar symptoms have happened previously with low blood sugar.  Upon arrival to the ED pt is mostly back to his baseline. He was otherwise feeling in his usual state of health this morning.  Denies recent illness, no fevers/vomiting/cough.  There are no other associated systemic symptoms, there are no other alleviating or modifying factors.    Past Medical History  Diagnosis Date  . Diabetes mellitus   . Hypertension   . Blind     Past Surgical History  Procedure Date  . Coronary angioplasty with stent placement     History reviewed. No pertinent family history.  History  Substance Use Topics  . Smoking status: Current Everyday Smoker  . Smokeless tobacco: Not on file  . Alcohol Use: No      Review of Systems ROS reviewed and otherwise negative except for mentioned in HPI  Allergies  Review of patient's allergies indicates no known allergies.  Home Medications   Current Outpatient Rx  Name Route Sig Dispense Refill  . ASPIRIN EC 81 MG PO TBEC Oral Take 81 mg by mouth daily.    Marland Kitchen ESOMEPRAZOLE MAGNESIUM 40 MG PO CPDR Oral Take 40 mg by mouth daily before breakfast.    . HYDROCODONE-ACETAMINOPHEN 10-650 MG PO TABS Oral Take 1 tablet by mouth every 8 (eight) hours as needed. For pain    . LIDOCAINE 5 % EX PTCH Transdermal Place 1 patch onto the skin daily. Remove & Discard patch within 12  hours or as directed by MD    . METOPROLOL SUCCINATE ER 50 MG PO TB24 Oral Take 50 mg by mouth daily. Take with or immediately following a meal.    . ADULT MULTIVITAMIN W/MINERALS CH Oral Take 1 tablet by mouth daily.    Marland Kitchen NITROGLYCERIN 0.4 MG/HR TD PT24 Transdermal Place 1 patch onto the skin daily.    Marland Kitchen PRASUGREL HCL 10 MG PO TABS Oral Take 10 mg by mouth daily.    Marland Kitchen ROSUVASTATIN CALCIUM 20 MG PO TABS Oral Take 20 mg by mouth daily.      BP 120/66  Pulse 71  Temp(Src) 98.2 F (36.8 C) (Oral)  Resp 19  SpO2 96% Vitals reviewed Physical Exam Physical Examination: General appearance - alert, tired appearing, and in no acute distress Mental status - alert, oriented to person, place, and time Eyes - pupils equal and reactive, extraocular eye movements intact, no conjunctival injection, no scleral icterus Mouth - mucous membranes moist, pharynx normal without lesions Chest - clear to auscultation, no wheezes, rales or rhonchi, symmetric air entry Heart - normal rate, regular rhythm, normal S1, S2, no murmurs, rubs, clicks or gallops Abdomen - soft, nontender, nondistended, no masses or organomegaly, nabs Neurological - alert, oriented, normal speech, cranial nerves 2-12 tested and intact, strength 5/5 in extremities x 4, sensation intact Extremities - peripheral pulses normal, no pedal edema, no clubbing or cyanosis Skin - normal coloration and  turgor, no rashes  ED Course  Procedures (including critical care time)  Date: 02/28/2012  Rate: 66  Rhythm: normal sinus rhythm  QRS Axis: left axis deviation  Intervals: normal  ST/T Wave abnormalities: normal  Conduction Disutrbances:prolonged PR  Narrative Interpretation:   Old EKG Reviewed:   2:35 PM Pt feeling much improved, he is laughing and joking.  He does c/o pain on the left side of his tongue- on exam left side of tongue is swollen with mild bruising- he may have bitten tongue/contused it during hypoglycemic event.      Labs  Reviewed  GLUCOSE, CAPILLARY - Abnormal; Notable for the following:    Glucose-Capillary 138 (*)    All other components within normal limits  COMPREHENSIVE METABOLIC PANEL - Abnormal; Notable for the following:    Glucose, Bld 140 (*)    GFR calc non Af Amer 64 (*)    GFR calc Af Amer 74 (*)    All other components within normal limits  URINALYSIS, ROUTINE W REFLEX MICROSCOPIC - Abnormal; Notable for the following:    Bilirubin Urine LARGE (*)    All other components within normal limits  GLUCOSE, CAPILLARY - Abnormal; Notable for the following:    Glucose-Capillary 121 (*)    All other components within normal limits  CBC  POCT I-STAT TROPONIN I  POCT I-STAT TROPONIN I  LAB REPORT - SCANNED   No results found.   1. Hypoglycemia       MDM  Pt presenting after episode of presumed hypoglycemia- associated with nausea/sweating/shakiness/depressed mental status.  These symptoms resolved after drinking OJ.  Blood sugar has remained stable in the ED, pt has a normal neuro exam- initially appeared somewhat tired, but then felt much improved during ED eval.  EKG, head ct and other testing reassuring, and i have a low suspicion for ACS, stroke or other serious cause of his symptoms.  Pt discharged with strict return precautions.  He and his family are in good spirits upon discharge and they are agreeable with this plan.         Ethelda Chick, MD 03/01/12 [phone]

## 2012-02-28 NOTE — ED Notes (Addendum)
Pt presents to department for evaluation of dizziness, lightheadedness, diaphoresis and fatigue. Pt states he became very weak, shaky and sweaty today while in church. States he felt like he was going to pass out but did not. Denies chest pain. Respirations unlabored. He is conscious alert and oriented x4 at the time. No neurological deficits noted. Able to move all extremities and follow commands appropriately. Denies pain. Skin warm and dry. No signs of distress noted at the present.

## 2012-02-28 NOTE — Discharge Instructions (Signed)
Return to the ED with any concerns including fainting, seizure activity, changes in speech, vomiting and not able to keep down medications or liquids, decreased level of alertness/lethargy, or any other alarming symptoms  You should check your blood sugar before each meal and at bedtime for the next 24-48 hours and be sure to arrange a recheck with your primary doctor.

## 2012-02-28 NOTE — ED Notes (Signed)
Pt reports was sitting at church, began feeling nauseated, sweating, shaky and weak, diabetic, ate yogurt and OJ for breakfast, concerned for low blood sugar, feels slightly better now

## 2012-02-28 NOTE — ED Notes (Signed)
CBG 138

## 2012-02-28 NOTE — ED Notes (Signed)
CBG= 121

## 2012-02-28 NOTE — ED Notes (Addendum)
Pt resting quietly at the time. He is conscious alert and oriented x4. Vital signs stable. Family at bedside. Pt eating snack at the time.

## 2012-05-17 ENCOUNTER — Other Ambulatory Visit: Payer: Self-pay | Admitting: Orthopedic Surgery

## 2012-05-17 DIAGNOSIS — M5104 Intervertebral disc disorders with myelopathy, thoracic region: Secondary | ICD-10-CM

## 2012-05-17 DIAGNOSIS — M545 Low back pain: Secondary | ICD-10-CM

## 2012-05-25 ENCOUNTER — Ambulatory Visit
Admission: RE | Admit: 2012-05-25 | Discharge: 2012-05-25 | Disposition: A | Discharge status: Home / Self Care | Payer: Medicare Other | Source: Ambulatory Visit | Attending: Orthopedic Surgery | Admitting: Orthopedic Surgery

## 2012-05-25 DIAGNOSIS — M5126 Other intervertebral disc displacement, lumbar region: Secondary | ICD-10-CM

## 2012-05-25 DIAGNOSIS — M545 Low back pain: Secondary | ICD-10-CM

## 2012-05-25 DIAGNOSIS — M5104 Intervertebral disc disorders with myelopathy, thoracic region: Secondary | ICD-10-CM

## 2012-05-25 NOTE — Discharge Instructions (Signed)

## 2012-06-08 ENCOUNTER — Ambulatory Visit
Admission: RE | Admit: 2012-06-08 | Discharge: 2012-06-08 | Disposition: A | Discharge status: Home / Self Care | Payer: Medicare Other | Source: Ambulatory Visit | Attending: Orthopedic Surgery | Admitting: Orthopedic Surgery

## 2012-06-08 MED ORDER — IOHEXOL 180 MG/ML  SOLN
1.0000 mL | Freq: Once | INTRAMUSCULAR | Status: AC | PRN
  Administered 2012-06-08: 1 mL via EPIDURAL

## 2012-06-08 MED ORDER — METHYLPREDNISOLONE ACETATE 40 MG/ML INJ SUSP (RADIOLOG
120.0000 mg | Freq: Once | INTRAMUSCULAR | Status: AC
  Administered 2012-06-08: 120 mg via EPIDURAL

## 2012-07-04 ENCOUNTER — Other Ambulatory Visit: Payer: Self-pay | Admitting: Orthopedic Surgery

## 2012-07-04 DIAGNOSIS — M545 Low back pain: Secondary | ICD-10-CM

## 2012-07-06 ENCOUNTER — Ambulatory Visit
Admission: RE | Admit: 2012-07-06 | Discharge: 2012-07-06 | Disposition: A | Discharge status: Home / Self Care | Payer: Medicare Other | Source: Ambulatory Visit | Attending: Orthopedic Surgery | Admitting: Orthopedic Surgery

## 2012-07-06 DIAGNOSIS — M545 Low back pain: Secondary | ICD-10-CM

## 2012-07-06 MED ORDER — IOHEXOL 180 MG/ML  SOLN
1.0000 mL | Freq: Once | INTRAMUSCULAR | Status: AC | PRN
  Administered 2012-07-06: 1 mL via EPIDURAL

## 2012-07-06 MED ORDER — METHYLPREDNISOLONE ACETATE 40 MG/ML INJ SUSP (RADIOLOG
120.0000 mg | Freq: Once | INTRAMUSCULAR | Status: AC
  Administered 2012-07-06: 120 mg via EPIDURAL

## 2012-10-20 ENCOUNTER — Other Ambulatory Visit: Payer: Self-pay | Admitting: Orthopedic Surgery

## 2012-10-20 ENCOUNTER — Ambulatory Visit
Admission: RE | Admit: 2012-10-20 | Discharge: 2012-10-20 | Disposition: A | Discharge status: Home / Self Care | Payer: Medicare Other | Source: Ambulatory Visit | Attending: Orthopedic Surgery | Admitting: Orthopedic Surgery

## 2012-10-20 DIAGNOSIS — M5412 Radiculopathy, cervical region: Secondary | ICD-10-CM

## 2012-12-03 ENCOUNTER — Emergency Department (HOSPITAL_COMMUNITY): Payer: Medicare Other

## 2012-12-03 ENCOUNTER — Emergency Department (HOSPITAL_COMMUNITY)
Admission: EM | Admit: 2012-12-03 | Discharge: 2012-12-03 | Disposition: A | Discharge status: Home / Self Care | Payer: Medicare Other | Attending: Emergency Medicine | Admitting: Emergency Medicine

## 2012-12-03 ENCOUNTER — Encounter (HOSPITAL_COMMUNITY): Payer: Self-pay | Admitting: *Deleted

## 2012-12-03 DIAGNOSIS — F17200 Nicotine dependence, unspecified, uncomplicated: Secondary | ICD-10-CM | POA: Insufficient documentation

## 2012-12-03 DIAGNOSIS — Z7982 Long term (current) use of aspirin: Secondary | ICD-10-CM | POA: Insufficient documentation

## 2012-12-03 DIAGNOSIS — I10 Essential (primary) hypertension: Secondary | ICD-10-CM | POA: Insufficient documentation

## 2012-12-03 DIAGNOSIS — H9209 Otalgia, unspecified ear: Secondary | ICD-10-CM | POA: Insufficient documentation

## 2012-12-03 DIAGNOSIS — H543 Unqualified visual loss, both eyes: Secondary | ICD-10-CM | POA: Insufficient documentation

## 2012-12-03 DIAGNOSIS — E119 Type 2 diabetes mellitus without complications: Secondary | ICD-10-CM | POA: Insufficient documentation

## 2012-12-03 DIAGNOSIS — R51 Headache: Secondary | ICD-10-CM

## 2012-12-03 DIAGNOSIS — Z79899 Other long term (current) drug therapy: Secondary | ICD-10-CM | POA: Insufficient documentation

## 2012-12-03 MED ORDER — HYDROCODONE-ACETAMINOPHEN 5-325 MG PO TABS: 1.0000 | ORAL_TABLET | ORAL | Status: DC | PRN

## 2012-12-03 NOTE — ED Notes (Addendum)
Reports onset yesterday of pain to left side of head and face, reports pain to left ear when he moves his head. Denies n/v.

## 2012-12-03 NOTE — ED Notes (Signed)
Pt returned to exam room from CT scan.

## 2012-12-03 NOTE — ED Provider Notes (Signed)
History     CSN: [id number]  Arrival date & time 12/03/12  1136   First MD Initiated Contact with Patient 12/03/12 1320      Chief Complaint  Patient presents with  . Headache  . Otalgia    (Consider location/radiation/quality/duration/timing/severity/associated sxs/prior treatment) Patient is a 78 y.o. male presenting with headaches. The history is provided by the patient.  Headache  This is a new problem. The current episode started yesterday. Pertinent negatives include no fever. Associated symptoms comments: He reports sharp pain that shot through his left ear, through temporal region into the left face yesterday. It was brief in duration and resolved with the exception of residual ear pain today. No N, V. He has a history of headache but reports this is different than his usual. No fever. He does not endorse hearing differences subsequent to the headache. .    Past Medical History  Diagnosis Date  . Diabetes mellitus   . Hypertension   . Blind     Past Surgical History  Procedure Date  . Coronary angioplasty with stent placement     History reviewed. No pertinent family history.  History  Substance Use Topics  . Smoking status: Current Every Day Smoker  . Smokeless tobacco: Not on file  . Alcohol Use: No      Review of Systems  Constitutional: Negative for fever and chills.  HENT: Positive for ear pain.   Respiratory: Negative.   Cardiovascular: Negative.   Gastrointestinal: Negative.   Musculoskeletal: Negative.   Skin: Negative.   Neurological: Positive for headaches.    Allergies  Review of patient's allergies indicates no known allergies.  Home Medications   Current Outpatient Rx  Name  Route  Sig  Dispense  Refill  . ALBUTEROL SULFATE HFA 108 (90 BASE) MCG/ACT IN AERS   Inhalation   Inhale 2 puffs into the lungs every 6 (six) hours as needed. For wheezing         . ASPIRIN EC 81 MG PO TBEC   Oral   Take 81 mg by mouth daily.           Marland Kitchen ESOMEPRAZOLE MAGNESIUM 40 MG PO CPDR   Oral   Take 40 mg by mouth daily before breakfast.         . HYDROCODONE-ACETAMINOPHEN 10-650 MG PO TABS   Oral   Take 1 tablet by mouth every 8 (eight) hours as needed. For pain         . METOPROLOL SUCCINATE ER 50 MG PO TB24   Oral   Take 50 mg by mouth daily. Take with or immediately following a meal.         . ADULT MULTIVITAMIN W/MINERALS CH   Oral   Take 1 tablet by mouth daily.         Marland Kitchen NITROGLYCERIN 0.4 MG/HR TD PT24   Transdermal   Place 1 patch onto the skin daily.         Marland Kitchen PRASUGREL HCL 10 MG PO TABS   Oral   Take 10 mg by mouth daily.         Marland Kitchen ROSUVASTATIN CALCIUM 20 MG PO TABS   Oral   Take 20 mg by mouth daily.           BP 132/87  Pulse 66  Temp 98 F (36.7 C) (Oral)  Resp 18  Ht 5' 7.5" (1.715 m)  Wt 203 lb (92.08 kg)  BMI 31.32 kg/m2  SpO2 98%  Physical Exam  Constitutional: He is oriented to person, place, and time. He appears well-developed and well-nourished.  HENT:  Head: Normocephalic and atraumatic.       No facial swelling or redness.   Eyes: Conjunctivae normal and EOM are normal. Pupils are equal, round, and reactive to light.  Neck: Normal range of motion.  Cardiovascular: Normal rate and regular rhythm.   No murmur heard. Pulmonary/Chest: Effort normal and breath sounds normal. He has no wheezes. He has no rales.  Abdominal: Soft. There is no tenderness.  Musculoskeletal: Normal range of motion.  Neurological: He is alert and oriented to person, place, and time. He has normal strength and normal reflexes. No sensory deficit. He displays a negative Romberg sign.  Skin: Skin is warm.    ED Course  Procedures (including critical care time)  Labs Reviewed - No data to display No results found.   No diagnosis found. 1. Headache    MDM  Patient's symptoms more than 24 hours ago without neurologic deficits on exam - doubt IC bleed, CVA. Pain is improved over onset,  residual ear pain.         Rodena Medin, PA-C 12/03/12 1611

## 2012-12-03 NOTE — ED Notes (Signed)
Pt presents to department for evaluation of L sided facial "breakdown." pt states he was sitting at home yesterday afternoon when L side of face felt "different." He describes this as a breakdown feeling. Episode lasted several seconds. Also states L ear pain and headache. States intermittent headache for several months. 7/10 pain at the time. No neurological deficits noted at the time.

## 2012-12-03 NOTE — ED Provider Notes (Signed)
Medical screening examination/treatment/procedure(s) were conducted as a shared visit with non-physician practitioner(s) and myself.  I personally evaluated the patient during the encounter.  Patient states a transient episode yesterday of left facial pain and denied headache during my history.  Hurman Horn, MD 12/03/12 2118

## 2012-12-03 NOTE — ED Notes (Signed)
Patient transported to CT 

## 2012-12-03 NOTE — ED Notes (Signed)
Pt to be discharged. Has no further questions at the time.

## 2013-03-16 ENCOUNTER — Encounter: Payer: Self-pay | Admitting: Geriatric Medicine

## 2013-03-16 ENCOUNTER — Encounter: Payer: Self-pay | Admitting: Internal Medicine

## 2013-03-16 ENCOUNTER — Ambulatory Visit (INDEPENDENT_AMBULATORY_CARE_PROVIDER_SITE_OTHER): Payer: Medicare Other | Admitting: Internal Medicine

## 2013-03-16 VITALS — BP 152/90 | HR 96 | Temp 98.2°F | Resp 16 | Ht 66.5 in | Wt 203.6 lb

## 2013-03-16 DIAGNOSIS — F329 Major depressive disorder, single episode, unspecified: Secondary | ICD-10-CM

## 2013-03-16 DIAGNOSIS — J4489 Other specified chronic obstructive pulmonary disease: Secondary | ICD-10-CM

## 2013-03-16 DIAGNOSIS — F32A Depression, unspecified: Secondary | ICD-10-CM | POA: Insufficient documentation

## 2013-03-16 DIAGNOSIS — E1159 Type 2 diabetes mellitus with other circulatory complications: Secondary | ICD-10-CM

## 2013-03-16 DIAGNOSIS — IMO0001 Reserved for inherently not codable concepts without codable children: Secondary | ICD-10-CM

## 2013-03-16 DIAGNOSIS — F3289 Other specified depressive episodes: Secondary | ICD-10-CM

## 2013-03-16 DIAGNOSIS — I10 Essential (primary) hypertension: Secondary | ICD-10-CM

## 2013-03-16 DIAGNOSIS — R252 Cramp and spasm: Secondary | ICD-10-CM

## 2013-03-16 DIAGNOSIS — B372 Candidiasis of skin and nail: Secondary | ICD-10-CM

## 2013-03-16 DIAGNOSIS — E785 Hyperlipidemia, unspecified: Secondary | ICD-10-CM

## 2013-03-16 DIAGNOSIS — J449 Chronic obstructive pulmonary disease, unspecified: Secondary | ICD-10-CM | POA: Insufficient documentation

## 2013-03-16 DIAGNOSIS — M48062 Spinal stenosis, lumbar region with neurogenic claudication: Secondary | ICD-10-CM

## 2013-03-16 MED ORDER — ALBUTEROL SULFATE HFA 108 (90 BASE) MCG/ACT IN AERS: 2.0000 | INHALATION_SPRAY | Freq: Four times a day (QID) | RESPIRATORY_TRACT | Status: DC | PRN

## 2013-03-16 MED ORDER — ZINC OXIDE 20 % EX OINT: 1.0000 "application " | TOPICAL_OINTMENT | Freq: Two times a day (BID) | CUTANEOUS | Status: DC

## 2013-03-16 NOTE — Progress Notes (Signed)
Patient ID: Eddie Graves, male   DOB: [birth year], 79 y.o.   MRN: [id number] Code Status: DNR  Allergies  Allergen Reactions  . Banana     Chief Complaint  Patient presents with  . Medical Managment of Chronic Issues  . cough  . Headache    HPI: Patient is a 79 y.o. legally blind AA male with very complicated past medical history seen in the office today for routine medical management, and c/o cough and headache.  Coughing fits.  Have been regular.  Headache in back of head--just about daily, happen at work, doesn't know duration, was taking med from Dr. Montez Morita, but used it up.  When coughs, coughs so hard that eyes water and throat feels like sandpaper.  Feels like right side is sore.  No fever, chills.  Has not had h/o allergies.    Feels like he is forgetting more.    Has not smoked a cigarette in over a week.  Says he has quit.  Tapered himself off.    Having a lot of cramps from ankle to upper thigh b/l.  Come and go at different times.  Not necessarily correlated with walking.  Happens at night and wakes him up.  Can barely walk when gets up in the morning--very stiff around back and hips.  Ok after showers and has breakfast.  Cramps not relieved with massage or getting up to walk.  Can hardly stand at first on them.  Tries to walk around a little.  Sits in his chair and they go away spontaneously.  Falls asleep while having them.  Then sore in the back of his thighs in the daytime.    Review of Systems:  Review of Systems  Constitutional: Negative for fever and chills.  HENT: Positive for congestion and sore throat.   Eyes: Negative for pain.       Legally blind.  Respiratory: Positive for cough, sputum production and shortness of breath. Negative for wheezing.   Cardiovascular: Positive for orthopnea and claudication. Negative for chest pain, palpitations, leg swelling and PND.  Gastrointestinal: Negative for heartburn and constipation.  Genitourinary: Negative for dysuria.   Musculoskeletal: Positive for back pain and joint pain. Negative for falls.  Neurological: Positive for sensory change and headaches. Negative for dizziness, focal weakness and loss of consciousness.  Endo/Heme/Allergies: Negative for environmental allergies. Does not bruise/bleed easily.  Psychiatric/Behavioral: Positive for depression and memory loss.       Continues to refuse medication for depression, continues to work so he does not get more depressed     Past Medical History  Diagnosis Date  . Diabetes mellitus   . Hypertension   . Blind   . Headache   . Sebaceous cyst   . Chronic airway obstruction, not elsewhere classified   . Cough   . Spinal stenosis, lumbar region, with neurogenic claudication   . Coronary atherosclerosis of native coronary artery   . Unspecified vitamin D deficiency   . Rash and other nonspecific skin eruption   . Type II or unspecified type diabetes mellitus without mention of complication, uncontrolled   . Other and unspecified hyperlipidemia   . Anxiety state, unspecified   . Depressive disorder, not elsewhere classified   . Legal blindness, as defined in [place]   . Unspecified essential hypertension   . Reflux esophagitis   . Unspecified constipation   . Pain in joint, site unspecified   . Memory loss   . Palpitations    Past Surgical History  Procedure Laterality Date  . Coronary angioplasty with stent placement    . Hernia repair     Social History:   reports that he has been smoking.  He does not have any smokeless tobacco history on file. He reports that he does not drink alcohol or use illicit drugs.  Family History  Problem Relation Age of Onset  . Asthma Mother   . Diabetes Son   . Cancer Daughter   . Diabetes Son     Medications: Patient's Medications  New Prescriptions   HYDROCORTISONE CREAM-NYSTATIN CREAM-ZINC OXIDE    Apply 1 application topically 2 (two) times daily.  Previous Medications   ACETAMINOPHEN (TYLENOL) 500  MG TABLET    Take one tablet three times daily for pain   ASPIRIN EC 81 MG TABLET    Take 81 mg by mouth daily.   CHOLECALCIFEROL (VITAMIN D) 2000 UNITS CAPS    Take 1 capsule by mouth daily.   CILOSTAZOL (PLETAL) 100 MG TABLET    Take one tablet once daily   ESOMEPRAZOLE (NEXIUM) 40 MG CAPSULE    Take 40 mg by mouth daily before breakfast.   ETODOLAC (LODINE XL) 400 MG 24 HR TABLET    Take one tablet once daily for arthritis   GARLIC (GARLIQUE) 400 MG TBEC    Take 1 tablet by mouth daily.   HYDROCODONE-ACETAMINOPHEN (LORCET) 10-650 MG PER TABLET    Take 1 tablet by mouth 2 (two) times daily as needed for pain.   METOPROLOL SUCCINATE (TOPROL-XL) 50 MG 24 HR TABLET    Take 50 mg by mouth daily. Take with or immediately following a meal.   MULTIPLE VITAMIN (MULITIVITAMIN WITH MINERALS) TABS    Take 1 tablet by mouth daily.   NITROGLYCERIN (NITRODUR - DOSED IN MG/24 HR) 0.4 MG/HR    Place 1 patch onto the skin daily.   PIOGLITAZONE-METFORMIN (ACTOPLUS MET) 15-850 MG PER TABLET    Take 1 tablet by mouth 3 (three) times daily before meals.   PRASUGREL (EFFIENT) 10 MG TABS    Take 10 mg by mouth daily.   ROSUVASTATIN (CRESTOR) 20 MG TABLET    Take 20 mg by mouth daily.  Modified Medications   Modified Medication Previous Medication   ALBUTEROL (PROVENTIL HFA;VENTOLIN HFA) 108 (90 BASE) MCG/ACT INHALER albuterol (PROVENTIL HFA;VENTOLIN HFA) 108 (90 BASE) MCG/ACT inhaler      Inhale 2 puffs into the lungs every 6 (six) hours as needed. For wheezing    Inhale 2 puffs into the lungs every 6 (six) hours as needed. For wheezing   LISINOPRIL (PRINIVIL,ZESTRIL) 5 MG TABLET lisinopril (PRINIVIL,ZESTRIL) 5 MG tablet      Take one tablet once daily for blood pressure    Take one tablet once daily for blood pressure  Discontinued Medications   HYDROCODONE-ACETAMINOPHEN (LORCET) 10-650 MG PER TABLET    Take 1 tablet by mouth every 8 (eight) hours as needed. For pain   HYDROCODONE-ACETAMINOPHEN (NORCO/VICODIN)  5-325 MG PER TABLET    Take 1 tablet by mouth every 4 (four) hours as needed for pain.   METFORMIN (GLUCOPHAGE) 850 MG TABLET    Take one tablet once daily for diabetes   PIOGLITAZONE-METFORMIN (ACTOPLUS MET) 15-850 MG PER TABLET         Physical Exam:  Filed Vitals:   03/16/13 0840  BP: 152/90  Pulse: 96  Temp: 98.2 F (36.8 C)  TempSrc: Oral  Resp: 16  Height: 5' 6.5" (1.689 m)  Weight: 203 lb 9.6  oz (92.352 kg)   Physical Exam  Constitutional: He is oriented to person, place, and time. He appears well-developed and well-nourished. No distress.  AA male, uses walking stick  HENT:  Head: Normocephalic and atraumatic.  Neck: Normal range of motion. No JVD present. No tracheal deviation present. No thyromegaly present.  Cardiovascular: Normal rate and regular rhythm.  Exam reveals no gallop and no friction rub.   Diminished arterial pulses b/l feet  Pulmonary/Chest: Breath sounds normal. No respiratory distress.  Prolonged expiratory phase  Abdominal: Soft. Bowel sounds are normal. He exhibits no distension.  Musculoskeletal: Normal range of motion.  Neurological: He is alert and oriented to person, place, and time. No cranial nerve deficit.  Sensation to light touch and pinprick wnl in feet and legs;  Temp sensation abnormal, vibratory sense abnormal, no ulcerations, does have onychomycosis and some callusing of b/l first metatarsal plantar surface/ball  Skin: Skin is warm and dry. He is not diaphoretic.  Psychiatric: He has a normal mood and affect.  Unable to provide a good, clear history;  Son also present but provides little information either, appears disinterested   Labs reviewed: 12/31/2011  CBC: wbc 6.7, rbc 4.13, Hemoglobin 12.2 03/14/2012  CBC: wbc 8.5, rbc 4.33, Hemoglobin 13.2 CMP: glucose 120, BUN 23, Creatinine 1.17 A1C: 7.2 Vitamin D: 29.9 11/10/2012 CBC Wbc 6.1 Rbc 4.52 Hemoglobin 13.3 CMP Glucose 146 Bun 15 Creatinine 1.20  HA1C 8.4  TSH 1.080 Vitamin D 25  Hydroxy 29.1  Past Procedures: 2011-Colonoscopy 2011-Stents Placed-Dr. Sharyn Lull 07/12/2011-Cervical Spine X-Ray: No fracture or dislocation is seen 09/18/2011-Lumbar Spine X-Ray: Degenerative disc disease in the lower lumbar spine. 12/31/2011-Chest X-Ray: Right lower lobe patchy airspace disease suspicious for early pneumonia. Underlying chronic interstitial changes and bronchitic change. 01/17/2012-MRI Lumbar Spine: Multilevel lumbar degenerative changes as above. This is most severe at L3-4 where there is a large central to right sided disc protrusion compressing the thecal sac and causing severe spinal stenosis. 02/28/2012-CT Head: No acute intracranial disease. Right globe prosthesis. Abnormal contour of the posterior aspect of the left lobe. These findings are compatible with provided history of blindness. 07/06/2012-Lumbar Epidural Injection; Technically successful second interlaminar epidural steroid injection on the right at L3-4 10/20/2012-Cervical Spine X-Ray: Straightened alignment with diffuse degenerative disc disease   Assessment/Plan Type II or unspecified type diabetes mellitus with peripheral circulatory disorders, uncontrolled(250.72) I have been stressing to the patient and his family for the past 2 years how important it is to check his glucose.  I have written a prescription for a glucometer that speaks to him, but he did not get this anyway.  I have asked for his family to step in to help check his sugars, but they do not do this.  His hba1c remains above goal, his neuropathy, claudication and renal function continue to decline.  His medication was to be changed to metformin alone at max dose several visits ago, but his list today continues to show actos with metformin and that is what the pharmacy has been filling for him.  I chose to just continue this until we can find a better way to manage his meds.  The pharmacy is keeping track of him, as well so he does not fill too early.  He  is also on a statin and ace inhibitor.  Because he continues to work, he does not qualify for home health assistance with his medications or glucose checks.  He often does not show up for appointments so visits here with the clinical  pharmacologist have also not been a success.  Each visit, we extensively discuss the importance of better adherence, and I make appts closer and closer together.  As usual, he has not had his previsit labs today, so cbc, bmp, hba1c were drawn after the appt.    Essential hypertension, benign BP was just above goal today.  He is not adherent with a low sodium diet.  I reinforced this.  Continue toprol-xl, asa, lisinopril.  Has h/o CAD.  COPD bronchitis I will need to put him on advair or a similar agent next visit due to frequent use of his albuterol; however, he is always out of the albuterol when he comes b/c he misses appts.  I am not aware that he has actually had PFTs done either, but, it is hard to get him to attend appts.  Depressive disorder, not elsewhere classified Refuses medication.  Is mostly lonely.    Other and unspecified hyperlipidemia Continue crestor.  Encouraged fish intake.  Would add fish oil, but he does not take his current medications properly.  Spinal stenosis, lumbar region, with neurogenic claudication Continues with as needed hydrocodone-apap for this.    Yeast skin infection:  Was out of nystatin powder-this was renewed.  Leg cramps:  Suspect this is due to poor arterial circulation.  Says he has quit smoking, but it has only been a few days.  Reviewed importance of that.  Continues on effient.  Also not so adherent with meds, glucose monitoring, diet and has little help from family.  Referred for arterial dopplers with ABIs bilaterally.    Labs/tests ordered:  Cbc, bmp, hba1c today and vascular study ABIs and arterial doppler

## 2013-03-16 NOTE — Patient Instructions (Addendum)
Please bring all of your medicines with you to the visit.    You were given a new cream for your rash.  I will check arterial dopplers of your legs with ABI to check your circulation to your feet because of your cramps.  We checked your labs today including blood counts, electrolytes, kidneys and sugar average.

## 2013-03-17 LAB — CBC WITH DIFFERENTIAL/PLATELET
Basophils Absolute: 0 10*3/uL (ref 0.0–0.2)
Basos: 1 % (ref 0–3)
Eos: 3 % (ref 0–5)
Eosinophils Absolute: 0.2 10*3/uL (ref 0.0–0.4)
HCT: 39.9 % (ref 37.5–51.0)
Hemoglobin: 12.8 g/dL (ref 12.6–17.7)
Immature Grans (Abs): 0 10*3/uL (ref 0.0–0.1)
Immature Granulocytes: 0 % (ref 0–2)
Lymphocytes Absolute: 2 10*3/uL (ref 0.7–3.1)
Lymphs: 33 % (ref 14–46)
MCH: 29 pg (ref 26.6–33.0)
MCHC: 32.1 g/dL (ref 31.5–35.7)
MCV: 90 fL (ref 79–97)
Monocytes Absolute: 0.8 10*3/uL (ref 0.1–0.9)
Monocytes: 13 % — ABNORMAL HIGH (ref 4–12)
Neutrophils Absolute: 3.1 10*3/uL (ref 1.4–7.0)
Neutrophils Relative %: 50 % (ref 40–74)
RBC: 4.42 x10E6/uL (ref 4.14–5.80)
RDW: 14.6 % (ref 12.3–15.4)
WBC: 6.2 10*3/uL (ref 3.4–10.8)

## 2013-03-17 LAB — COMPREHENSIVE METABOLIC PANEL
ALT: 14 IU/L (ref 0–44)
AST: 29 IU/L (ref 0–40)
Albumin/Globulin Ratio: 1.3 (ref 1.1–2.5)
Albumin: 4.2 g/dL (ref 3.5–4.8)
Alkaline Phosphatase: 88 IU/L (ref 39–117)
BUN/Creatinine Ratio: 8 — ABNORMAL LOW (ref 10–22)
BUN: 9 mg/dL (ref 8–27)
CO2: 27 mmol/L (ref 19–28)
Calcium: 10.4 mg/dL — ABNORMAL HIGH (ref 8.6–10.2)
Chloride: 101 mmol/L (ref 97–108)
Creatinine, Ser: 1.11 mg/dL (ref 0.76–1.27)
GFR calc Af Amer: 73 mL/min/{1.73_m2} (ref >59)
GFR calc non Af Amer: 63 mL/min/{1.73_m2} (ref >59)
Globulin, Total: 3.2 g/dL (ref 1.5–4.5)
Glucose: 122 mg/dL — ABNORMAL HIGH (ref 65–99)
Potassium: 4.8 mmol/L (ref 3.5–5.2)
Sodium: 141 mmol/L (ref 134–144)
Total Bilirubin: 0.4 mg/dL (ref 0.0–1.2)
Total Protein: 7.4 g/dL (ref 6.0–8.5)

## 2013-03-17 LAB — HEMOGLOBIN A1C
Est. average glucose Bld gHb Est-mCnc: 180 mg/dL
Hgb A1c MFr Bld: 7.9 % — ABNORMAL HIGH (ref 4.8–5.6)

## 2013-03-20 ENCOUNTER — Telehealth: Payer: Self-pay | Admitting: *Deleted

## 2013-03-20 ENCOUNTER — Other Ambulatory Visit: Payer: Self-pay | Admitting: *Deleted

## 2013-03-20 MED ORDER — METFORMIN HCL 850 MG PO TABS: ORAL_TABLET | ORAL | Status: DC

## 2013-03-20 MED ORDER — ALBUTEROL SULFATE HFA 108 (90 BASE) MCG/ACT IN AERS: 2.0000 | INHALATION_SPRAY | Freq: Four times a day (QID) | RESPIRATORY_TRACT | Status: DC | PRN

## 2013-03-20 MED ORDER — LISINOPRIL 5 MG PO TABS: ORAL_TABLET | ORAL | Status: DC

## 2013-03-20 NOTE — Telephone Encounter (Signed)
Patient stated that Dr Renato Gails was suppose to call in two prescriptions for patient an inhaler and he can not remember what was suppose to be called in. Please Advise. Pharmacy is [facility] on [address]

## 2013-03-21 NOTE — Telephone Encounter (Signed)
Pharmacy is managing patient's medications to prevent excessive or incorrect refills.  CMA spoke with them and medication list was revised accordingly.  No additional refills should be done based on patient or family request ONLY through the pharmacy.

## 2013-04-01 NOTE — Assessment & Plan Note (Signed)
Continues with as needed hydrocodone-apap for this.

## 2013-04-01 NOTE — Assessment & Plan Note (Signed)
Refuses medication.  Is mostly lonely.

## 2013-04-01 NOTE — Assessment & Plan Note (Signed)
BP was just above goal today.  He is not adherent with a low sodium diet.  I reinforced this.  Continue toprol-xl, asa, lisinopril.  Has h/o CAD.

## 2013-04-01 NOTE — Assessment & Plan Note (Addendum)
I have been stressing to the patient and his family for the past 2 years how important it is to check his glucose.  I have written a prescription for a glucometer that speaks to him, but he did not get this anyway.  I have asked for his family to step in to help check his sugars, but they do not do this.  His hba1c remains above goal, his neuropathy, claudication and renal function continue to decline.  His medication was to be changed to metformin alone at max dose several visits ago, but his list today continues to show actos with metformin and that is what the pharmacy has been filling for him.  I chose to just continue this until we can find a better way to manage his meds.  The pharmacy is keeping track of him, as well so he does not fill too early.  He is also on a statin and ace inhibitor.  Because he continues to work, he does not qualify for home health assistance with his medications or glucose checks.  He often does not show up for appointments so visits here with the clinical pharmacologist have also not been a success.  Each visit, we extensively discuss the importance of better adherence, and I make appts closer and closer together.  As usual, he has not had his previsit labs today, so cbc, bmp, hba1c were drawn after the appt.

## 2013-04-01 NOTE — Assessment & Plan Note (Signed)
I will need to put him on advair or a similar agent next visit due to frequent use of his albuterol; however, he is always out of the albuterol when he comes b/c he misses appts.  I am not aware that he has actually had PFTs done either, but, it is hard to get him to attend appts.

## 2013-04-01 NOTE — Assessment & Plan Note (Signed)
Continue crestor.  Encouraged fish intake.  Would add fish oil, but he does not take his current medications properly.

## 2013-04-14 ENCOUNTER — Encounter: Payer: Self-pay | Admitting: *Deleted

## 2013-04-17 ENCOUNTER — Encounter: Payer: Self-pay | Admitting: Internal Medicine

## 2013-04-17 ENCOUNTER — Ambulatory Visit (INDEPENDENT_AMBULATORY_CARE_PROVIDER_SITE_OTHER): Payer: Medicare Other | Admitting: Internal Medicine

## 2013-04-17 VITALS — BP 134/78 | HR 94 | Temp 98.0°F | Resp 16 | Ht 66.5 in | Wt 200.0 lb

## 2013-04-17 DIAGNOSIS — E1165 Type 2 diabetes mellitus with hyperglycemia: Secondary | ICD-10-CM | POA: Insufficient documentation

## 2013-04-17 DIAGNOSIS — E1142 Type 2 diabetes mellitus with diabetic polyneuropathy: Secondary | ICD-10-CM

## 2013-04-17 DIAGNOSIS — E1149 Type 2 diabetes mellitus with other diabetic neurological complication: Secondary | ICD-10-CM

## 2013-04-17 DIAGNOSIS — E1159 Type 2 diabetes mellitus with other circulatory complications: Secondary | ICD-10-CM

## 2013-04-17 DIAGNOSIS — E559 Vitamin D deficiency, unspecified: Secondary | ICD-10-CM

## 2013-04-17 DIAGNOSIS — E1151 Type 2 diabetes mellitus with diabetic peripheral angiopathy without gangrene: Secondary | ICD-10-CM

## 2013-04-17 DIAGNOSIS — B372 Candidiasis of skin and nail: Secondary | ICD-10-CM

## 2013-04-17 DIAGNOSIS — R413 Other amnesia: Secondary | ICD-10-CM

## 2013-04-17 DIAGNOSIS — IMO0002 Reserved for concepts with insufficient information to code with codable children: Secondary | ICD-10-CM

## 2013-04-17 DIAGNOSIS — I2510 Atherosclerotic heart disease of native coronary artery without angina pectoris: Secondary | ICD-10-CM | POA: Insufficient documentation

## 2013-04-17 DIAGNOSIS — I798 Other disorders of arteries, arterioles and capillaries in diseases classified elsewhere: Secondary | ICD-10-CM

## 2013-04-17 MED ORDER — ZINC OXIDE 20 % EX OINT: 1.0000 "application " | TOPICAL_OINTMENT | Freq: Two times a day (BID) | CUTANEOUS | Status: DC

## 2013-04-17 NOTE — Progress Notes (Signed)
Patient ID: Eddie Graves, male   DOB: 01/25/1931, 79 y.o.   MRN: [id number] Code Status: need to discuss at next routine visit  Allergies  Allergen Reactions  . Banana     Chief Complaint  Patient presents with  . Medical Managment of Chronic Issues    both feet hurt, bump on chest    HPI: Patient is a 79 y.o. AA male seen in the office today for mgt of chronic diseases.     Goes wed to his ophthalmologist.  Is legally blind due to a traumatic injury.    Discussed diabetic shoes for his feet.  Glucose levels are not in control b/c he is unable to check them on his own and he has no support at home to help him.  His son is going on dialysis soon himself.  C/o nerve pains in his feet.  Still works so does not qualify for home health.  Said he had a talking meter, but still did not check his sugar.  Review of Systems:  Review of Systems  Constitutional: Positive for malaise/fatigue. Negative for fever, chills and weight loss.  HENT: Negative for congestion.   Eyes:       Legally blind  Respiratory: Positive for cough, sputum production, shortness of breath and wheezing.   Cardiovascular: Positive for claudication and leg swelling. Negative for chest pain.  Gastrointestinal: Negative for heartburn and constipation.  Genitourinary: Negative for dysuria.  Musculoskeletal: Positive for back pain and joint pain. Negative for falls.  Skin: Negative for rash.  Neurological: Negative for dizziness, focal weakness and weakness.  Psychiatric/Behavioral: Positive for depression and memory loss.     Past Medical History  Diagnosis Date  . DM (diabetes mellitus) type II controlled peripheral vascular disorder   . Essential hypertension, benign   . Blind   . Headache   . Sebaceous cyst   . Chronic airway obstruction, not elsewhere classified   . Cough   . Spinal stenosis, lumbar region, with neurogenic claudication   . Coronary atherosclerosis of native coronary artery   . Vitamin D  deficiency   . Rash and other nonspecific skin eruption   . Other and unspecified hyperlipidemia   . Anxiety state, unspecified   . Depressive disorder, not elsewhere classified   . Legal blindness, as defined in [place]   . Reflux esophagitis   . Unspecified constipation   . Pain in joint, site unspecified   . Memory loss   . Palpitations   . Type II diabetes mellitus with neurological manifestations, uncontrolled    Past Surgical History  Procedure Laterality Date  . Coronary angioplasty with stent placement    . Hernia repair     Social History:   reports that he quit smoking about 5 weeks ago. He does not have any smokeless tobacco history on file. He reports that he does not drink alcohol or use illicit drugs.  Family History  Problem Relation Age of Onset  . Asthma Mother   . Diabetes Son   . Cancer Daughter   . Diabetes Son     Medications: Patient's Medications  New Prescriptions   No medications on file  Previous Medications   ACETAMINOPHEN (TYLENOL) 500 MG TABLET    Take one tablet three times daily for pain   ALBUTEROL (PROVENTIL HFA;VENTOLIN HFA) 108 (90 BASE) MCG/ACT INHALER    Inhale 2 puffs into the lungs every 6 (six) hours as needed. For wheezing   ASPIRIN EC 81 MG TABLET  Take 81 mg by mouth daily.   CHOLECALCIFEROL (VITAMIN D) 2000 UNITS CAPS    Take 1 capsule by mouth daily.   CILOSTAZOL (PLETAL) 100 MG TABLET    Take one tablet once daily   ESOMEPRAZOLE (NEXIUM) 40 MG CAPSULE    Take 40 mg by mouth daily before breakfast.   ETODOLAC (LODINE XL) 400 MG 24 HR TABLET    Take one tablet once daily for arthritis   GARLIC (GARLIQUE) 400 MG TBEC    Take 1 tablet by mouth daily.   HYDROCODONE-ACETAMINOPHEN (LORCET) 10-650 MG PER TABLET    Take 1 tablet by mouth 2 (two) times daily as needed for pain.   HYDROCORTISONE CREAM-NYSTATIN CREAM-ZINC OXIDE    Apply 1 application topically 2 (two) times daily.   LISINOPRIL (PRINIVIL,ZESTRIL) 5 MG TABLET    Take one  tablet once daily for blood pressure   METFORMIN (GLUCOPHAGE) 850 MG TABLET       METOPROLOL SUCCINATE (TOPROL-XL) 50 MG 24 HR TABLET    Take 50 mg by mouth daily. Take with or immediately following a meal.   MULTIPLE VITAMIN (MULITIVITAMIN WITH MINERALS) TABS    Take 1 tablet by mouth daily.   NITROGLYCERIN (NITRODUR - DOSED IN MG/24 HR) 0.4 MG/HR    Place 1 patch onto the skin daily.   PIOGLITAZONE-METFORMIN (ACTOPLUS MET) 15-850 MG PER TABLET    Take 1 tablet by mouth 3 (three) times daily before meals.   PREDNICARBATE 0.1 % CREA       ROSUVASTATIN (CRESTOR) 20 MG TABLET    Take 20 mg by mouth daily.  Modified Medications   No medications on file  Discontinued Medications   PRASUGREL (EFFIENT) 10 MG TABS    Take 10 mg by mouth daily.     Physical Exam: Filed Vitals:   04/17/13 0954  BP: 134/78  Pulse: 94  Temp: 98 F (36.7 C)  TempSrc: Oral  Resp: 16  Height: 5' 6.5" (1.689 m)  Weight: 200 lb (90.719 kg)  SpO2: 94%  Physical Exam  Constitutional: He is oriented to person, place, and time. He appears well-developed and well-nourished.  Overweight AA male  HENT:  Head: Normocephalic and atraumatic.  Cardiovascular: Normal rate, regular rhythm and normal heart sounds.   Diminished pulses in feet  Pulmonary/Chest: Effort normal and breath sounds normal. No respiratory distress.  Abdominal: Soft. Bowel sounds are normal. He exhibits no distension. There is no tenderness.  Musculoskeletal: Normal range of motion. He exhibits no edema and no tenderness.  Neurological: He is alert and oriented to person, place, and time.  Skin: Skin is warm and dry.    Labs reviewed: Basic Metabolic Panel:  Recent Labs  86/57/84 1006  NA 141  K 4.8  CL 101  CO2 27  GLUCOSE 122*  BUN 9  CREATININE 1.11  CALCIUM 10.4*   Liver Function Tests:  Recent Labs  03/16/13 1006  AST 29  ALT 14  ALKPHOS 88  BILITOT 0.4  PROT 7.4   CBC:  Recent Labs  03/16/13 1006  WBC 6.2   NEUTROABS 3.1  HGB 12.8  HCT 39.9  MCV 90  Assessment/Plan 1. Type II diabetes mellitus with neurological manifestations, uncontrolled --advised to go to podiatrist for nail trimming --needs new diabetic shoes --referred to [facility] for assistance with managing this  2. DM (diabetes mellitus) type II controlled peripheral vascular disorder --ABIs ordered and unclear what happened to order after it was placed-pt never informed of appointment  3. Coronary atherosclerosis of native coronary artery --bp, lipid and diabetic control key--just had MI last year --not adherent with diet or exercise and does not check sugar--no one helps with this at home and pt keeps working so cannot get home health assistance --encouraged to adhere  4. Vitamin D deficiency --needs f/u level -recommended 2000 units daily and 20 mins sunshine  5. Memory loss --needs MMSE done to assess memory excluding the questions that require him to read or draw something  6. Skin yeast infection - due to poor diabetic control -hydrocortisone cream-nystatin cream-zinc oxide; Apply 1 application topically 2 (two) times daily.  Dispense: 1 Tube; Refill: 3  Labs/tests ordered:  Ordered for before routine visit

## 2013-04-18 ENCOUNTER — Other Ambulatory Visit: Payer: Self-pay | Admitting: *Deleted

## 2013-04-18 MED ORDER — PIOGLITAZONE HCL-METFORMIN HCL 15-850 MG PO TABS: ORAL_TABLET | ORAL | Status: DC

## 2013-05-29 ENCOUNTER — Encounter (HOSPITAL_COMMUNITY): Payer: Self-pay | Admitting: *Deleted

## 2013-05-29 ENCOUNTER — Inpatient Hospital Stay (HOSPITAL_COMMUNITY)
Admission: EM | Admit: 2013-05-29 | Discharge: 2013-05-31 | DRG: 065 | Disposition: A | Discharge status: Home / Self Care | Payer: Medicare Other | Attending: Diagnostic Neuroimaging | Admitting: Diagnostic Neuroimaging

## 2013-05-29 DIAGNOSIS — I2510 Atherosclerotic heart disease of native coronary artery without angina pectoris: Secondary | ICD-10-CM | POA: Diagnosis present

## 2013-05-29 DIAGNOSIS — Z9861 Coronary angioplasty status: Secondary | ICD-10-CM

## 2013-05-29 DIAGNOSIS — E1159 Type 2 diabetes mellitus with other circulatory complications: Secondary | ICD-10-CM | POA: Diagnosis present

## 2013-05-29 DIAGNOSIS — I609 Nontraumatic subarachnoid hemorrhage, unspecified: Secondary | ICD-10-CM | POA: Diagnosis present

## 2013-05-29 DIAGNOSIS — E1149 Type 2 diabetes mellitus with other diabetic neurological complication: Secondary | ICD-10-CM | POA: Diagnosis present

## 2013-05-29 DIAGNOSIS — I10 Essential (primary) hypertension: Secondary | ICD-10-CM | POA: Diagnosis present

## 2013-05-29 DIAGNOSIS — E785 Hyperlipidemia, unspecified: Secondary | ICD-10-CM | POA: Diagnosis present

## 2013-05-29 DIAGNOSIS — E1151 Type 2 diabetes mellitus with diabetic peripheral angiopathy without gangrene: Secondary | ICD-10-CM | POA: Diagnosis present

## 2013-05-29 DIAGNOSIS — F329 Major depressive disorder, single episode, unspecified: Secondary | ICD-10-CM | POA: Diagnosis present

## 2013-05-29 DIAGNOSIS — N289 Disorder of kidney and ureter, unspecified: Secondary | ICD-10-CM

## 2013-05-29 DIAGNOSIS — R569 Unspecified convulsions: Secondary | ICD-10-CM | POA: Diagnosis present

## 2013-05-29 DIAGNOSIS — F3289 Other specified depressive episodes: Secondary | ICD-10-CM | POA: Diagnosis present

## 2013-05-29 DIAGNOSIS — J449 Chronic obstructive pulmonary disease, unspecified: Secondary | ICD-10-CM | POA: Diagnosis present

## 2013-05-29 DIAGNOSIS — F411 Generalized anxiety disorder: Secondary | ICD-10-CM | POA: Diagnosis present

## 2013-05-29 DIAGNOSIS — I798 Other disorders of arteries, arterioles and capillaries in diseases classified elsewhere: Secondary | ICD-10-CM | POA: Diagnosis present

## 2013-05-29 DIAGNOSIS — G8190 Hemiplegia, unspecified affecting unspecified side: Secondary | ICD-10-CM | POA: Diagnosis present

## 2013-05-29 DIAGNOSIS — Z87891 Personal history of nicotine dependence: Secondary | ICD-10-CM

## 2013-05-29 DIAGNOSIS — Z79899 Other long term (current) drug therapy: Secondary | ICD-10-CM

## 2013-05-29 DIAGNOSIS — M48062 Spinal stenosis, lumbar region with neurogenic claudication: Secondary | ICD-10-CM | POA: Diagnosis present

## 2013-05-29 DIAGNOSIS — K2100 Gastro-esophageal reflux disease with esophagitis, without bleeding: Secondary | ICD-10-CM | POA: Diagnosis present

## 2013-05-29 DIAGNOSIS — E559 Vitamin D deficiency, unspecified: Secondary | ICD-10-CM | POA: Diagnosis present

## 2013-05-29 DIAGNOSIS — H543 Unqualified visual loss, both eyes: Secondary | ICD-10-CM | POA: Diagnosis present

## 2013-05-29 DIAGNOSIS — J4489 Other specified chronic obstructive pulmonary disease: Secondary | ICD-10-CM | POA: Diagnosis present

## 2013-05-29 NOTE — ED Notes (Signed)
Pt is here with pain to left side (arm and leg) since Thursday and has been intermittent.  Pt states that his entire left side goes numb and it happened 4 times today.  PT states that when it happens he can hold his arm up and it just falls to the side.  Pt reports neck pain.  Pt states that he has been up on a ladder cleaning out glutters since Thursday.  Pt is alert and oriented.  No left arm drift or facial deficits now.

## 2013-05-29 NOTE — ED Provider Notes (Signed)
History    CSN: [id number] Arrival date & time 05/29/13  1726  First MD Initiated Contact with Patient 05/29/13 2327     Chief Complaint  Patient presents with  . Leg Pain  . Shoulder Pain   (Consider location/radiation/quality/duration/timing/severity/associated sxs/prior Treatment) HPI Hx per patient. L sided weakness/ numbness. Onset 3 days ago, on and off, multiple episodes.  4 days ago was cleaning his gutters.  Symptoms last about 15 min, start in his L leg and move up to his arm. Symptoms more frequent today, last episode was about 3 pm, now resolved. He had 4 episodes today.  He had neck pain 2 days ago, was brief and severe and resolved and denies any neck or back pain since. No h/o CVA or similar episodes otherwise. No CP, SOB or palpitations. No F/C. No HA or syncope. No arm or leg pain.   Past Medical History  Diagnosis Date  . DM (diabetes mellitus) type II controlled peripheral vascular disorder   . Essential hypertension, benign   . Blind   . Headache(784.0)   . Sebaceous cyst   . Chronic airway obstruction, not elsewhere classified   . Cough   . Spinal stenosis, lumbar region, with neurogenic claudication   . Coronary atherosclerosis of native coronary artery   . Vitamin D deficiency   . Rash and other nonspecific skin eruption   . Other and unspecified hyperlipidemia   . Anxiety state, unspecified   . Depressive disorder, not elsewhere classified   . Legal blindness, as defined in [place]   . Reflux esophagitis   . Unspecified constipation   . Pain in joint, site unspecified   . Memory loss   . Palpitations   . Type II diabetes mellitus with neurological manifestations, uncontrolled    Past Surgical History  Procedure Laterality Date  . Coronary angioplasty with stent placement    . Hernia repair     Family History  Problem Relation Age of Onset  . Asthma Mother   . Diabetes Son   . Cancer Daughter   . Diabetes Son    History  Substance Use Topics   . Smoking status: Former Smoker -- 40 years    Quit date: 03/07/2013  . Smokeless tobacco: Not on file  . Alcohol Use: No    Review of Systems  Constitutional: Negative for fever and chills.  HENT: Negative for neck stiffness.   Respiratory: Negative for shortness of breath.   Cardiovascular: Negative for chest pain.  Gastrointestinal: Negative for abdominal pain.  Genitourinary: Negative for dysuria.  Musculoskeletal: Negative for back pain.  Skin: Negative for rash.  Neurological: Positive for weakness and numbness. Negative for speech difficulty and headaches.  All other systems reviewed and are negative.    Allergies  Banana  Home Medications   Current Outpatient Rx  Name  Route  Sig  Dispense  Refill  . acetaminophen (TYLENOL) 500 MG tablet   Oral   Take 500 mg by mouth every 4 (four) hours as needed for pain. Take one tablet three times daily for pain         . albuterol (PROVENTIL HFA;VENTOLIN HFA) 108 (90 BASE) MCG/ACT inhaler   Inhalation   Inhale 2 puffs into the lungs every 6 (six) hours as needed. For wheezing   1 Inhaler   3   . aspirin EC 81 MG tablet   Oral   Take 81 mg by mouth daily.         Marland Kitchen  Cholecalciferol (VITAMIN D) 2000 UNITS CAPS   Oral   Take 1 capsule by mouth daily.         . cilostazol (PLETAL) 100 MG tablet      Take one tablet once daily         . esomeprazole (NEXIUM) 40 MG capsule   Oral   Take 40 mg by mouth daily before breakfast.         . etodolac (LODINE XL) 400 MG 24 hr tablet   Oral   Take 400 mg by mouth daily. Take one tablet once daily for arthritis         . Garlic (GARLIQUE) 400 MG TBEC   Oral   Take 1 tablet by mouth daily. Hold while in hospital         . HYDROcodone-acetaminophen (LORCET) 10-650 MG per tablet   Oral   Take 1 tablet by mouth 2 (two) times daily as needed for pain.         . hydrocortisone cream-nystatin cream-zinc oxide   Topical   Apply 1 application topically 2 (two)  times daily.   1 Tube   3   . lisinopril (PRINIVIL,ZESTRIL) 5 MG tablet      Take one tablet once daily for blood pressure   30 tablet   5   . metFORMIN (GLUCOPHAGE) 850 MG tablet   Oral   Take 850 mg by mouth daily.          . metoprolol succinate (TOPROL-XL) 50 MG 24 hr tablet   Oral   Take 50 mg by mouth daily. Take with or immediately following a meal.         . Multiple Vitamin (MULITIVITAMIN WITH MINERALS) TABS   Oral   Take 1 tablet by mouth daily.         . nitroGLYCERIN (NITRODUR - DOSED IN MG/24 HR) 0.4 mg/hr   Transdermal   Place 1 patch onto the skin daily.         . pioglitazone-metformin (ACTOPLUS MET) 15-850 MG per tablet      Take 1 tablet 3 times a day for blood sugar   90 tablet   5   . Prednicarbate 0.1 % CREA   Oral   Take 1 application by mouth daily.          . rosuvastatin (CRESTOR) 20 MG tablet   Oral   Take 20 mg by mouth daily.          BP 123/66  Pulse 73  Temp(Src) 98 F (36.7 C) (Oral)  Resp 18  SpO2 97% Physical Exam  Constitutional: He is oriented to person, place, and time. He appears well-developed and well-nourished.  HENT:  Head: Normocephalic and atraumatic.  Eyes: Right eye exhibits no discharge. Left eye exhibits no discharge.  (Legally blind) chronic changes  Neck: Normal range of motion. Neck supple.  No c spine tenderness   Cardiovascular: Normal rate, regular rhythm and intact distal pulses.   Pulmonary/Chest: Effort normal and breath sounds normal. No respiratory distress.  Abdominal: Soft. Bowel sounds are normal. He exhibits no distension.  Musculoskeletal: Normal range of motion. He exhibits no edema and no tenderness.  Neurological: He is alert and oriented to person, place, and time.  Equal strengths and sensorium to light touch x 4, no pronator drift, speech clear, no unilateral deficits, no facial droop  Skin: Skin is warm and dry.    ED Course  Procedures (including critical care  time)  Results for orders placed during the hospital encounter of 05/29/13  POCT I-STAT, CHEM 8      Result Value Range   Sodium 138  135 - 145 mEq/L   Potassium 4.5  3.5 - 5.1 mEq/L   Chloride 104  96 - 112 mEq/L   BUN 27 (*) 6 - 23 mg/dL   Creatinine, Ser 0.45 (*) 0.50 - 1.35 mg/dL   Glucose, Bld 409 (*) 70 - 99 mg/dL   Calcium, Ion 8.11  9.14 - 1.30 mmol/L   TCO2 24  0 - 100 mmol/L   Hemoglobin 12.6 (*) 13.0 - 17.0 g/dL   HCT 78.2 (*) 95.6 - 21.3 %   Ct Head Wo Contrast  05/30/2013   *RADIOLOGY REPORT*  Clinical Data:  Left-sided numbness.  CT HEAD WITHOUT CONTRAST CT CERVICAL SPINE WITHOUT CONTRAST  Technique:  Multidetector CT imaging of the head and cervical spine was performed following the standard protocol without intravenous contrast.  Multiplanar CT image reconstructions of the cervical spine were also generated.  Comparison:  12/03/2012  CT HEAD  Findings: There is a small amount of subarachnoid blood in the right frontal region.  No other areas of hemorrhage.  No parenchymal hemorrhage.  No hydrocephalus.  No midline shift.  Mild age related volume loss.  No acute infarction.  No acute calvarial abnormality. Visualized paranasal sinuses and mastoids clear.  Orbital soft tissues unremarkable.  Postoperative changes in the right orbit.  The  IMPRESSION: Small amount of subarachnoid hemorrhage in the right frontal region.  CT CERVICAL SPINE  Findings: Diffuse degenerative disc disease.  Mild bilateral facet disease.  Prevertebral soft tissues are normal.  No fracture.  No epidural or paraspinal hematoma.  Central disc herniation noted as C3-4  IMPRESSION: No acute bony abnormality.  Central disc herniation at C3-4.   These results were called to Dr. Dierdre Highman at the time of interpretation.   Original Report Authenticated By: Charlett Nose, M.D.   Ct Cervical Spine Wo Contrast  05/30/2013   *RADIOLOGY REPORT*  Clinical Data:  Left-sided numbness.  CT HEAD WITHOUT CONTRAST CT CERVICAL SPINE  WITHOUT CONTRAST  Technique:  Multidetector CT imaging of the head and cervical spine was performed following the standard protocol without intravenous contrast.  Multiplanar CT image reconstructions of the cervical spine were also generated.  Comparison:  12/03/2012  CT HEAD  Findings: There is a small amount of subarachnoid blood in the right frontal region.  No other areas of hemorrhage.  No parenchymal hemorrhage.  No hydrocephalus.  No midline shift.  Mild age related volume loss.  No acute infarction.  No acute calvarial abnormality. Visualized paranasal sinuses and mastoids clear.  Orbital soft tissues unremarkable.  Postoperative changes in the right orbit.  The  IMPRESSION: Small amount of subarachnoid hemorrhage in the right frontal region.  CT CERVICAL SPINE  Findings: Diffuse degenerative disc disease.  Mild bilateral facet disease.  Prevertebral soft tissues are normal.  No fracture.  No epidural or paraspinal hematoma.  Central disc herniation noted as C3-4  IMPRESSION: No acute bony abnormality.  Central disc herniation at C3-4.   These results were called to Dr. Dierdre Highman at the time of interpretation.   Original Report Authenticated By: Charlett Nose, M.D.       Date: 05/30/2013  Rate: 77  Rhythm: normal sinus rhythm  QRS Axis: normal  Intervals: PR prolonged  ST/T Wave abnormalities: nonspecific ST/T changes  Conduction Disutrbances:first-degree A-V block   Narrative Interpretation:   Old EKG  Reviewed: none available   2:30 AM d/w Dr Danielle Dess - will review CT scan. He recs MRI 2:59 AM d/w NEU - Dr Roseanne Reno will evaluate for admit/ MRI  MDM  [facility]  CT, labs, ECG  NEU admit  Sunnie Nielsen, MD 05/30/13 [phone]

## 2013-05-30 ENCOUNTER — Encounter (HOSPITAL_COMMUNITY): Payer: Self-pay | Admitting: Radiology

## 2013-05-30 ENCOUNTER — Emergency Department (HOSPITAL_COMMUNITY): Payer: Medicare Other

## 2013-05-30 ENCOUNTER — Inpatient Hospital Stay (HOSPITAL_COMMUNITY): Payer: Medicare Other

## 2013-05-30 DIAGNOSIS — I798 Other disorders of arteries, arterioles and capillaries in diseases classified elsewhere: Secondary | ICD-10-CM

## 2013-05-30 DIAGNOSIS — I609 Nontraumatic subarachnoid hemorrhage, unspecified: Secondary | ICD-10-CM | POA: Diagnosis present

## 2013-05-30 DIAGNOSIS — I10 Essential (primary) hypertension: Secondary | ICD-10-CM

## 2013-05-30 DIAGNOSIS — E1159 Type 2 diabetes mellitus with other circulatory complications: Secondary | ICD-10-CM

## 2013-05-30 LAB — POCT I-STAT, CHEM 8
Calcium, Ion: 1.21 mmol/L (ref 1.13–1.30)
Chloride: 104 mEq/L (ref 96–112)
Glucose, Bld: 201 mg/dL — ABNORMAL HIGH (ref 70–99)
HCT: 37 % — ABNORMAL LOW (ref 39.0–52.0)
Hemoglobin: 12.6 g/dL — ABNORMAL LOW (ref 13.0–17.0)
Potassium: 4.5 mEq/L (ref 3.5–5.1)

## 2013-05-30 LAB — CBC
HCT: 37.8 % — ABNORMAL LOW (ref 39.0–52.0)
MCHC: 33.1 g/dL (ref 30.0–36.0)
MCV: 85.9 fL (ref 78.0–100.0)
Platelets: 177 10*3/uL (ref 150–400)
RDW: 13.1 % (ref 11.5–15.5)
WBC: 5.6 10*3/uL (ref 4.0–10.5)

## 2013-05-30 LAB — GLUCOSE, CAPILLARY: Glucose-Capillary: 142 mg/dL — ABNORMAL HIGH (ref 70–99)

## 2013-05-30 LAB — MRSA PCR SCREENING: MRSA by PCR: NEGATIVE

## 2013-05-30 MED ORDER — SODIUM CHLORIDE 0.9 % IV SOLN
INTRAVENOUS | Status: DC
  Administered 2013-05-30: 07:00:00 via INTRAVENOUS

## 2013-05-30 MED ORDER — ACETAMINOPHEN 325 MG PO TABS: 650.0000 mg | ORAL_TABLET | ORAL | Status: DC | PRN

## 2013-05-30 MED ORDER — LISINOPRIL 5 MG PO TABS
5.0000 mg | ORAL_TABLET | Freq: Every day | ORAL | Status: DC
  Administered 2013-05-30 – 2013-05-31 (×2): 5 mg via ORAL
  Filled 2013-05-30 (×2): qty 1

## 2013-05-30 MED ORDER — INSULIN ASPART 100 UNIT/ML ~~LOC~~ SOLN
0.0000 [IU] | Freq: Three times a day (TID) | SUBCUTANEOUS | Status: DC
  Administered 2013-05-30: 3 [IU] via SUBCUTANEOUS
  Administered 2013-05-30: 2 [IU] via SUBCUTANEOUS
  Administered 2013-05-31: 5 [IU] via SUBCUTANEOUS
  Administered 2013-05-31: 2 [IU] via SUBCUTANEOUS

## 2013-05-30 MED ORDER — PANTOPRAZOLE SODIUM 40 MG IV SOLR
40.0000 mg | Freq: Every day | INTRAVENOUS | Status: DC
  Administered 2013-05-30: 40 mg via INTRAVENOUS
  Filled 2013-05-30: qty 40

## 2013-05-30 MED ORDER — METFORMIN HCL 850 MG PO TABS: 850.0000 mg | ORAL_TABLET | Freq: Every day | ORAL | Status: DC

## 2013-05-30 MED ORDER — METOPROLOL SUCCINATE ER 50 MG PO TB24
50.0000 mg | ORAL_TABLET | Freq: Every day | ORAL | Status: DC
  Administered 2013-05-30 – 2013-05-31 (×2): 50 mg via ORAL
  Filled 2013-05-30 (×2): qty 1

## 2013-05-30 MED ORDER — LABETALOL HCL 5 MG/ML IV SOLN: 10.0000 mg | INTRAVENOUS | Status: DC | PRN

## 2013-05-30 MED ORDER — INSULIN ASPART 100 UNIT/ML ~~LOC~~ SOLN: 0.0000 [IU] | Freq: Every day | SUBCUTANEOUS | Status: DC

## 2013-05-30 MED ORDER — PIOGLITAZONE HCL-METFORMIN HCL 15-850 MG PO TABS: 1.0000 | ORAL_TABLET | Freq: Every day | ORAL | Status: DC

## 2013-05-30 MED ORDER — ACETAMINOPHEN 650 MG RE SUPP: 650.0000 mg | RECTAL | Status: DC | PRN

## 2013-05-30 MED ORDER — ATORVASTATIN CALCIUM 40 MG PO TABS
40.0000 mg | ORAL_TABLET | Freq: Every day | ORAL | Status: DC
  Administered 2013-05-30: 40 mg via ORAL
  Filled 2013-05-30 (×2): qty 1

## 2013-05-30 MED ORDER — PIOGLITAZONE HCL 15 MG PO TABS
15.0000 mg | ORAL_TABLET | Freq: Every day | ORAL | Status: DC
  Administered 2013-05-31: 15 mg via ORAL
  Filled 2013-05-30 (×2): qty 1

## 2013-05-30 MED ORDER — SODIUM CHLORIDE 0.9 % IV SOLN
500.0000 mg | Freq: Two times a day (BID) | INTRAVENOUS | Status: DC
  Administered 2013-05-30 – 2013-05-31 (×3): 500 mg via INTRAVENOUS
  Filled 2013-05-30 (×5): qty 5

## 2013-05-30 MED ORDER — ADULT MULTIVITAMIN W/MINERALS CH
1.0000 | ORAL_TABLET | Freq: Every day | ORAL | Status: DC
  Administered 2013-05-31: 1 via ORAL
  Filled 2013-05-30: qty 1

## 2013-05-30 NOTE — Progress Notes (Signed)
Utilization Review Completed.Alby Schwabe T6/24/2014  

## 2013-05-30 NOTE — Progress Notes (Signed)
Nutrition Brief Note  Malnutrition Screening Tool result is inaccurate.  Please consult if nutrition needs are identified.  Ngozi Alvidrez, MS RD LDN Clinical Inpatient Dietitian Pager: [phone] Weekend/After hours pager: [phone]   

## 2013-05-30 NOTE — ED Notes (Signed)
Provided a [place] sandwich and soda per patient request and EDP approval.

## 2013-05-30 NOTE — Progress Notes (Signed)
Stroke Team Progress Note  HISTORY Eddie Graves is an 79 y.o. male history of diabetes mellitus, hypertension, lumbar spinal stenosis and coronary atherosclerosis, presenting 05/29/2013 with intermittent numbness involving his left arm and left leg for 3 days. He has not experienced weakness. Speech is not changed. He's been taking aspirin 81 mg per day. CT scan of his head showed a small right frontal area of subarachnoid hemorrhage. Patient has not experienced headache. Numbness involving his left extremities his been lasting for less than half an hour. Frequency has increased. NIH stroke score was 4. Patient was not a TPA candidate secondary to delay in arrival, [facility]. He was admitted to the neuro ICU for further evaluation and treatment.  SUBJECTIVE No family is at the bedside.  Overall he feels his condition is stable. He complains his BP cuff is hurting his arm.  OBJECTIVE Most recent Vital Signs: Filed Vitals:   05/30/13 0730 05/30/13 0745 05/30/13 0800 05/30/13 0830  BP: 141/76 136/67 159/67 147/80  Pulse: 78 74 81 74  Temp:   97.8 F (36.6 C)   TempSrc:   Oral   Resp:   20 13  Height:   5\' 6"  (1.676 m)   Weight:   91.3 kg (201 lb 4.5 oz)   SpO2: 97% 95% 98% 96%   CBG (last 3)  No results found for this basename: GLUCAP,  in the last 72 hours  IV Fluid Intake:   . sodium chloride 75 mL/hr at 05/30/13 0630    MEDICATIONS  . levETIRAcetam  500 mg Intravenous Q12H  . pantoprazole (PROTONIX) IV  40 mg Intravenous QHS   PRN:  acetaminophen, acetaminophen, labetalol  Diet:  NPO  Activity:  Bedrest DVT Prophylaxis:  SCDs   CLINICALLY SIGNIFICANT STUDIES Basic Metabolic Panel:   Recent Labs Lab 05/30/13 0015  NA 138  K 4.5  CL 104  GLUCOSE 201*  BUN 27*  CREATININE 1.50*   Liver Function Tests: No results found for this basename: AST, ALT, ALKPHOS, BILITOT, PROT, ALBUMIN,  in the last 168 hours CBC:   Recent Labs Lab 05/30/13 0015  HGB 12.6*  HCT 37.0*    Coagulation: No results found for this basename: LABPROT, INR,  in the last 168 hours Cardiac Enzymes: No results found for this basename: CKTOTAL, CKMB, CKMBINDEX, TROPONINI,  in the last 168 hours Urinalysis: No results found for this basename: COLORURINE, APPERANCEUR, LABSPEC, PHURINE, GLUCOSEU, HGBUR, BILIRUBINUR, KETONESUR, PROTEINUR, UROBILINOGEN, NITRITE, LEUKOCYTESUR,  in the last 168 hours Lipid Panel    Component Value Date/Time   CHOL  Value: 125        ATP III CLASSIFICATION:  <200     mg/dL   Desirable  161-096  mg/dL   Borderline High  >=045    mg/dL   High        03/15/8118 1010   TRIG 106 04/05/2009 1010   HDL 31* 04/05/2009 1010   CHOLHDL 4.0 04/05/2009 1010   VLDL 21 04/05/2009 1010   LDLCALC  Value: 73        Total Cholesterol/HDL:CHD Risk Coronary Heart Disease Risk Table                     Men   Women  1/2 Average Risk   3.4   3.3  Average Risk       5.0   4.4  2 X Average Risk   9.6   7.1  3 X Average Risk  23.4  11.0        Use the calculated Patient Ratio above and the CHD Risk Table to determine the patient's CHD Risk.        ATP III CLASSIFICATION (LDL):  <100     mg/dL   Optimal  147-829  mg/dL   Near or Above                    Optimal  130-159  mg/dL   Borderline  562-130  mg/dL   High  >865     mg/dL   Very High 7/84/6962 9528   HgbA1C  Lab Results  Component Value Date   HGBA1C 7.9* 03/16/2013    Urine Drug Screen:   No results found for this basename: labopia,  cocainscrnur,  labbenz,  amphetmu,  thcu,  labbarb    Alcohol Level: No results found for this basename: ETH,  in the last 168 hours  CT of the brain  05/30/2013   Small amount of subarachnoid hemorrhage in the right frontal region.e of interpretation.     CT Cervical Spine Wo Contrast 05/30/2013  No acute bony abnormality.  Central disc herniation at C3-4.    MRI of the brain    MRA of the brain    CXR    EKG  normal sinus rhythm, 1st degree AV block.   Therapy Recommendations   Physical  Exam    GENERAL EXAM: Patient is in no distress  CARDIOVASCULAR: Regular rate and rhythm, no murmurs, no carotid bruits  NEUROLOGIC: MENTAL STATUS: awake, alert, language fluent, comprehension intact, naming intact CRANIAL NERVE: BLIND. OPACIFIED EYES. extraocular muscles intact, no nystagmus, facial sensation and strength symmetric, uvula midline, shoulder shrug symmetric, tongue midline. MOTOR: normal bulk and tone, full strength in the BUE, BLE SENSORY: normal and symmetric to light touch; DECR VIB AT TOES (6 SEC), SYMM. SUBJ NUMBNESS IN LEFT ARM AND LEG. REFLEXES: deep tendon reflexes present and symmetric   ASSESSMENT [name] is a 79 y.o. male presenting with intermittent numbness left arm and leg x 3 days. Imaging confirms a small right frontal SAH, location not consistent with numbness. Questionable seizure as etiology of numbness. SAH felt to be secondary to unknown etiology.  On aspirin 81 mg orally every day prior to admission. Patient with resultant numbness that has resolved. There is no weakness. Work up underway.  Hypertension Diabetes ? Seizure, on keppra  blind Hx headaches CAD - angioplast w/ stent placement Memory loss Cigarette smoker  Hospital day # 1  TREATMENT/PLAN  No antithrombotics given SAH  MRI and MRA to further evaluate hemorrhage  OOB. Therapy evals  Plan transfer to the floor this afternoon if he remains stable  Annie Main, MSN, RN, ANVP-BC, ANP-BC, GNP-BC [facility] Pager: [phone] 05/30/2013 9:15 AM  I have personally obtained a history, examined the patient, evaluated imaging results, and formulated the assessment and plan of care. I agree with the above. Small right frontal convexity subarachnoid hemorrhage, may be related to cerebral amyloid angiopathy, reversible cerebral vasoconstriction syndrome, cerebral venous sinus thrombosis, large-vessel stenotic atherosclerosis, or posterior reversible encephalopathy  syndrome. Will proceed with work up.   Suanne Marker, MD 05/30/2013, 7:53 PM Certified in Neurology, Neurophysiology and Neuroimaging [facility] - Stroke Team  Please refer to [email or web address] for on-call Stroke MD

## 2013-05-30 NOTE — Evaluation (Signed)
Physical Therapy Evaluation Patient Details Name: Eddie Graves MRN: [id number] DOB: 22-Jan-1931 Today's Date: 05/30/2013 Time: 1914-7829 PT Time Calculation (min): 23 min  PT Assessment / Plan / Recommendation Clinical Impression  Patient is a 79 y.o. male admitted with numbness right UE/LE found to have small right frontal SAH.  He presents with mild deficiencies in activity tolerance and balance and right LE strength and will benefit from skilled PT in the acute setting to maximize independence and allow return to independence at home, and pt is hopeful for quick return to work due to feeling it is what keeps him going.  No current recommendations for follow up PT due to feel soon return to work would be best therapy for this pt.    PT Assessment  Patient needs continued PT services    Follow Up Recommendations  No PT follow up    Does the patient have the potential to tolerate intense rehabilitation    N/A  Barriers to Discharge  Decreased caregiver support      Equipment Recommendations  None recommended by PT    Recommendations for Other Services   None  Frequency Min 4X/week    Precautions / Restrictions Precautions Precaution Comments: pt totally blind   Pertinent Vitals/Pain No pain complaints      Mobility  Bed Mobility Bed Mobility: Sit to Supine Sit to Supine: 6: Modified independent (Device/Increase time) Transfers Transfers: Sit to Stand;Stand to Sit Sit to Stand: 5: Supervision;From bed Stand to Sit: To chair/3-in-1;4: Min guard Details for Transfer Assistance: assist to find surface and sit slowly due to low seat Ambulation/Gait Ambulation/Gait Assistance: 4: Min assist Ambulation Distance (Feet): 200 Feet Assistive device: 1 person hand held assist Ambulation/Gait Assistance Details: slightly imbalanced, assisted to guide due to blindness with pt holding onto my elbow. Gait Pattern: Step-through pattern;Wide base of support;Lateral trunk lean to  right;Lateral trunk lean to left Gait velocity: WNL Stairs: No        PT Diagnosis: Abnormality of gait  PT Problem List: Decreased strength;Decreased activity tolerance;Decreased balance;Decreased mobility PT Treatment Interventions: DME instruction;Gait training;Functional mobility training;Therapeutic activities;Therapeutic exercise;Patient/family education;Balance training   PT Goals Acute Rehab PT Goals PT Goal Formulation: With patient Time For Goal Achievement: 06/06/13 Potential to Achieve Goals: Good Pt will go Sit to Stand: with modified independence PT Goal: Sit to Stand - Progress: Goal set today Pt will Stand: Independently;with unilateral upper extremity support;3 - 5 min PT Goal: Stand - Progress: Goal set today Pt will Ambulate: >150 feet;with modified independence;with cane PT Goal: Ambulate - Progress: Goal set today  Visit Information  Last PT Received On: 05/30/13 Assistance Needed: +1    Subjective Data  Subjective: Feel about 80-85% of normal. Patient Stated Goal: To return to work ASAP.   Prior Functioning  Home Living Lives With: Family (grandsons in and out sometimes not there for week at a time) Available Help at Discharge: Available PRN/intermittently Type of Home: House Home Access: Stairs to enter Secretary/administrator of Steps: 1 Entrance Stairs-Rails: None Home Layout: One level Bathroom Shower/Tub: Forensic scientist: Handicapped height Home Adaptive Equipment: Straight cane;Shower chair with back Additional Comments: blind basically all life Prior Function Level of Independence: Independent with assistive device(s) Able to Take Stairs?: Yes Driving: No Vocation: Full time employment Comments: 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; 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 Communication Communication: No difficulties Dominant Hand: Right    Cognition  Cognition Arousal/Alertness: Awake/alert Behavior  During Therapy: WFL for tasks assessed/performed Overall Cognitive Status: Within Functional Limits for tasks assessed    Extremity/Trunk Assessment Right Lower Extremity Assessment RLE ROM/Strength/Tone: WFL for tasks assessed RLE Sensation: WFL - Light Touch Left Lower Extremity Assessment LLE ROM/Strength/Tone: Deficits LLE ROM/Strength/Tone Deficits: Hip flexion 4/5, knee extension 4/5, flexion 4-/5 LLE Sensation: WFL - Light Touch   Balance Balance Balance Assessed: Yes Static Standing Balance Static Standing - Balance Support: No upper extremity supported Static Standing - Level of Assistance: 5: Stand by assistance Static Standing - Comment/# of Minutes: standing static prior to walking out of room while adjusting lines  End of Session PT - End of Session Equipment Utilized During Treatment: Gait belt Activity Tolerance: Patient tolerated treatment well Patient left: in chair;with call bell/phone within reach;with family/visitor present;with nursing in room  GP     [facility] 05/30/2013, 11:59 AM Sheran Lawless, PT [phone] 05/30/2013

## 2013-05-30 NOTE — ED Notes (Signed)
Attempted report 

## 2013-05-30 NOTE — H&P (Addendum)
Admission H&P    Chief Complaint: Intermittent numbness involving left arm and leg.    HPI: Eddie Graves is an 79 y.o. male history of diabetes mellitus, hypertension, lumbar spinal stenosis and coronary atherosclerosis, presenting with intermittent numbness involving his left arm and left leg for 3 days. He has not experienced weakness. Speech is not changed. He's been taking aspirin 81 mg per day. CT scan of his head showed a small right frontal area of subarachnoid hemorrhage. Patient has not experienced headache. Numbness involving his left extremities his been lasting for less than half an hour. Frequency has increased. NIH stroke score was 4.  LSN: 05/26/2013 tPA Given: No: Subarachnoid hemorrhage mRankin:  Past Medical History  Diagnosis Date  . DM (diabetes mellitus) type II controlled peripheral vascular disorder   . Essential hypertension, benign   . Blind   . Headache(784.0)   . Sebaceous cyst   . Chronic airway obstruction, not elsewhere classified   . Cough   . Spinal stenosis, lumbar region, with neurogenic claudication   . Coronary atherosclerosis of native coronary artery   . Vitamin D deficiency   . Rash and other nonspecific skin eruption   . Other and unspecified hyperlipidemia   . Anxiety state, unspecified   . Depressive disorder, not elsewhere classified   . Legal blindness, as defined in [place]   . Reflux esophagitis   . Unspecified constipation   . Pain in joint, site unspecified   . Memory loss   . Palpitations   . Type II diabetes mellitus with neurological manifestations, uncontrolled     Past Surgical History  Procedure Laterality Date  . Coronary angioplasty with stent placement    . Hernia repair      Family History  Problem Relation Age of Onset  . Asthma Mother   . Diabetes Son   . Cancer Daughter   . Diabetes Son    Social History:  reports that he quit smoking about 2 months ago. He does not have any smokeless tobacco history on file. He  reports that he does not drink alcohol or use illicit drugs.  Allergies:  Allergies  Allergen Reactions  . Banana     unknown    ROS: History obtained from the patient  General ROS: negative for - chills, fatigue, fever, night sweats, weight gain or weight loss Psychological ROS: negative for - behavioral disorder, hallucinations, memory difficulties, mood swings or suicidal ideation Ophthalmic ROS: Legally blind ENT ROS: negative for - epistaxis, nasal discharge, oral lesions, sore throat, tinnitus or vertigo Allergy and Immunology ROS: negative for - hives or itchy/watery eyes Hematological and Lymphatic ROS: negative for - bleeding problems, bruising or swollen lymph nodes Endocrine ROS: negative for - galactorrhea, hair pattern changes, polydipsia/polyuria or temperature intolerance Respiratory ROS: negative for - cough, hemoptysis, shortness of breath or wheezing Cardiovascular ROS: negative for - chest pain, dyspnea on exertion, edema or irregular heartbeat Gastrointestinal ROS: negative for - abdominal pain, diarrhea, hematemesis, nausea/vomiting or stool incontinence Genito-Urinary ROS: Chronic neurogenic bladder Musculoskeletal ROS: Back pain with known lumbar spinal stenosis Neurological ROS: as noted in HPI Dermatological ROS: negative for rash and skin lesion changes  Physical Examination: Blood pressure 141/64, pulse 73, temperature 98 F (36.7 C), temperature source Oral, resp. rate 18, SpO2 96.00%.  HEENT-  Normocephalic, no lesions, without obvious abnormality.  Normal external eye and conjunctiva.  Normal TM's bilaterally.  Normal auditory canals and external ears. Normal external nose, mucus membranes and septum.  Normal pharynx.  Neck supple with no masses, nodes, nodules or enlargement. Cardiovascular - regular rate and rhythm, S1, S2 normal, no murmur, click, rub or gallop Lungs - chest clear, no wheezing, rales, normal symmetric air entry, Heart exam - S1, S2  normal, no murmur, no gallop, rate regular Abdomen - soft, non-tender; bowel sounds normal; no masses,  no organomegaly Extremities - no joint deformities, effusion, or inflammation and no edema  Neurologic Examination: Mental Status: Alert, oriented, thought content appropriate.  Speech fluent without evidence of aphasia. Able to follow commands without difficulty. Cranial Nerves: II-totally blind including inability to perceive light. III/IV/VI-opacified corneals bilaterally. Unable to abduct as well as adduct right eye on lateral gaze; incomplete adduction of left eye on right lateral gaze V/VII-no facial numbness; mild left lower facial weakness facial weakness. VIII-normal. X-normal speech. Motor: 5/5 bilaterally with normal tone and bulk Sensory: Normal throughout. Deep Tendon Reflexes: Trace to 1+ in upper extremities and absent in lower extremities. Plantars: Mute bilaterally Cerebellar: Upper extremity coordination was normal bilaterally. Carotid auscultation: Normal  Results for orders placed during the hospital encounter of 05/29/13 (from the past 48 hour(s))  POCT I-STAT, CHEM 8     Status: Abnormal   Collection Time    05/30/13 12:15 AM      Result Value Range   Sodium 138  135 - 145 mEq/L   Potassium 4.5  3.5 - 5.1 mEq/L   Chloride 104  96 - 112 mEq/L   BUN 27 (*) 6 - 23 mg/dL   Creatinine, Ser 1.61 (*) 0.50 - 1.35 mg/dL   Glucose, Bld 096 (*) 70 - 99 mg/dL   Calcium, Ion 0.45  4.09 - 1.30 mmol/L   TCO2 24  0 - 100 mmol/L   Hemoglobin 12.6 (*) 13.0 - 17.0 g/dL   HCT 81.1 (*) 91.4 - 78.2 %   Ct Head Wo Contrast  05/30/2013   *RADIOLOGY REPORT*  Clinical Data:  Left-sided numbness.  CT HEAD WITHOUT CONTRAST CT CERVICAL SPINE WITHOUT CONTRAST  Technique:  Multidetector CT imaging of the head and cervical spine was performed following the standard protocol without intravenous contrast.  Multiplanar CT image reconstructions of the cervical spine were also generated.   Comparison:  12/03/2012  CT HEAD  Findings: There is a small amount of subarachnoid blood in the right frontal region.  No other areas of hemorrhage.  No parenchymal hemorrhage.  No hydrocephalus.  No midline shift.  Mild age related volume loss.  No acute infarction.  No acute calvarial abnormality. Visualized paranasal sinuses and mastoids clear.  Orbital soft tissues unremarkable.  Postoperative changes in the right orbit.  The  IMPRESSION: Small amount of subarachnoid hemorrhage in the right frontal region.  CT CERVICAL SPINE  Findings: Diffuse degenerative disc disease.  Mild bilateral facet disease.  Prevertebral soft tissues are normal.  No fracture.  No epidural or paraspinal hematoma.  Central disc herniation noted as C3-4  IMPRESSION: No acute bony abnormality.  Central disc herniation at C3-4.   These results were called to Dr. Dierdre Highman at the time of interpretation.   Original Report Authenticated By: Charlett Nose, M.D.   Ct Cervical Spine Wo Contrast  05/30/2013   *RADIOLOGY REPORT*  Clinical Data:  Left-sided numbness.  CT HEAD WITHOUT CONTRAST CT CERVICAL SPINE WITHOUT CONTRAST  Technique:  Multidetector CT imaging of the head and cervical spine was performed following the standard protocol without intravenous contrast.  Multiplanar CT image reconstructions of the cervical spine were also generated.  Comparison:  12/03/2012  CT HEAD  Findings: There is a small amount of subarachnoid blood in the right frontal region.  No other areas of hemorrhage.  No parenchymal hemorrhage.  No hydrocephalus.  No midline shift.  Mild age related volume loss.  No acute infarction.  No acute calvarial abnormality. Visualized paranasal sinuses and mastoids clear.  Orbital soft tissues unremarkable.  Postoperative changes in the right orbit.  The  IMPRESSION: Small amount of subarachnoid hemorrhage in the right frontal region.  CT CERVICAL SPINE  Findings: Diffuse degenerative disc disease.  Mild bilateral facet disease.   Prevertebral soft tissues are normal.  No fracture.  No epidural or paraspinal hematoma.  Central disc herniation noted as C3-4  IMPRESSION: No acute bony abnormality.  Central disc herniation at C3-4.   These results were called to Dr. Dierdre Highman at the time of interpretation.   Original Report Authenticated By: Charlett Nose, M.D.    Assessment: 79 y.o. male with diabetes mellitus and hypertension, presenting with acute small right frontal subarachnoid hemorrhage with probable associated focal seizure activity manifested as intermittent left extremity sensory symptoms.  Stroke Risk Factors - diabetes mellitus, hyperlipidemia and hypertension  Plan: 1. HgbA1c, fasting lipid panel 2. MRI, MRA  of the brain without contrast 3. PT consult, OT consult 4. Echocardiogram 5. Carotid dopplers 6. Prophylactic therapy-None 7. Risk factor modification . Telemetry monitoring   C.R. Roseanne Reno, MD [facility] [phone]  05/30/2013, 3:38 AM

## 2013-05-31 DIAGNOSIS — Z79899 Other long term (current) drug therapy: Secondary | ICD-10-CM

## 2013-05-31 DIAGNOSIS — N289 Disorder of kidney and ureter, unspecified: Secondary | ICD-10-CM

## 2013-05-31 LAB — GLUCOSE, CAPILLARY
Glucose-Capillary: 141 mg/dL — ABNORMAL HIGH (ref 70–99)
Glucose-Capillary: 97 mg/dL (ref 70–99)

## 2013-05-31 MED ORDER — PANTOPRAZOLE SODIUM 40 MG PO TBEC: 40.0000 mg | DELAYED_RELEASE_TABLET | Freq: Every day | ORAL | Status: DC

## 2013-05-31 MED ORDER — LEVETIRACETAM 500 MG PO TABS: 500.0000 mg | ORAL_TABLET | Freq: Two times a day (BID) | ORAL | Status: DC

## 2013-05-31 MED ORDER — ATORVASTATIN CALCIUM 40 MG PO TABS: 40.0000 mg | ORAL_TABLET | Freq: Every day | ORAL | Status: DC

## 2013-05-31 MED ORDER — PIOGLITAZONE HCL 15 MG PO TABS: 15.0000 mg | ORAL_TABLET | Freq: Every day | ORAL | Status: DC

## 2013-05-31 NOTE — Progress Notes (Signed)
Physical Therapy Treatment Patient Details Name: Eddie Graves MRN: [id number] DOB: 1931/07/29 Today's Date: 05/31/2013 Time: 1191-4782 PT Time Calculation (min): 25 min  PT Assessment / Plan / Recommendation  PT Comments   Patient progressing towards goals for mobility.  Able to increase ambulation distance and worked on high level balance.  Some slight residual deficit on right side still evident, however should be able to function better in his home environment.  Follow Up Recommendations  No PT follow up     Does the patient have the potential to tolerate intense rehabilitation   N/A  Barriers to Discharge  None      Equipment Recommendations  None recommended by PT    Recommendations for Other Services  None  Frequency Min 4X/week   Progress towards PT Goals Progress towards PT goals: Progressing toward goals  Plan Current plan remains appropriate    Precautions / Restrictions Precautions Precaution Comments: pt totally blind   Pertinent Vitals/Pain Denies pain     Mobility  Bed Mobility Sit to Supine: 6: Modified independent (Device/Increase time) Transfers Sit to Stand: 6: Modified independent (Device/Increase time);From bed Stand to Sit: 6: Modified independent (Device/Increase time);To chair/3-in-1 Details for Transfer Assistance: independent to sit once cued to location of chair Ambulation/Gait Ambulation/Gait Assistance: 4: Min guard Ambulation Distance (Feet): 400 Feet Assistive device: 1 person hand held assist Ambulation/Gait Assistance Details: guiding with pt holding my elbow; veered right x2 recovers without increased assist Gait Pattern: Step-through pattern Modified Rankin (Stroke Patients Only) Pre-Morbid Rankin Score: No significant disability Modified Rankin: Moderately severe disability      PT Goals    Visit Information  Last PT Received On: 05/31/13    Subjective Data   Reports still generally weaker than normal.   Cognition  Cognition Arousal/Alertness: Awake/alert Behavior During Therapy: WFL for tasks assessed/performed Overall Cognitive Status: Within Functional Limits for tasks assessed    Balance  Static Standing Balance Static Standing - Balance Support: No upper extremity supported Static Standing - Level of Assistance: 6: Modified independent (Device/Increase time) High Level Balance High Level Balance Activites: Side stepping;Backward walking High Level Balance Comments: tandem gait forward and back with wall rail, forward and backwards marching, crossovers, toe walking  End of Session PT - End of Session Equipment Utilized During Treatment: Gait belt Activity Tolerance: Patient tolerated treatment well Patient left: in chair;with call bell/phone within reach   GP     [facility] 05/31/2013, 10:49 AM Sheran Lawless, PT [phone] 05/31/2013

## 2013-05-31 NOTE — Progress Notes (Signed)
Stroke Team Progress Note  HISTORY  HPI: Treyshawn Muldrew is an 79 y.o. male history of diabetes mellitus, hypertension, lumbar spinal stenosis and coronary atherosclerosis, presenting with intermittent numbness involving his left arm and left leg for 3 days. He has not experienced weakness. Speech is not changed. He's been taking aspirin 81 mg per day. CT scan of his head showed a small right frontal area of subarachnoid hemorrhage. Patient has not experienced headache. Numbness involving his left extremities his been lasting for less than half an hour. Frequency has increased. NIH stroke score was 4.  LSN: 05/26/2013  tPA Given: No: Subarachnoid hemorrhage     SUBJECTIVE   Up walking in room.    OBJECTIVE Most recent Vital Signs: Filed Vitals:   05/30/13 2352 05/31/13 0004 05/31/13 0400 05/31/13 0403  BP:  119/65 131/68   Pulse:  65 71   Temp: 98.1 F (36.7 C)   97.4 F (36.3 C)  TempSrc: Oral   Oral  Resp:  15 15   Height:      Weight:      SpO2:  99% 99%    CBG (last 3)   Recent Labs  05/30/13 1118 05/30/13 1735 05/30/13 2202  GLUCAP 158* 142* 147*    IV Fluid Intake:   . sodium chloride 75 mL/hr at 05/31/13 0600    MEDICATIONS  . atorvastatin  40 mg Oral q1800  . insulin aspart  0-15 Units Subcutaneous TID WC  . insulin aspart  0-5 Units Subcutaneous QHS  . levETIRAcetam  500 mg Intravenous Q12H  . lisinopril  5 mg Oral Daily  . metoprolol succinate  50 mg Oral Daily  . multivitamin with minerals  1 tablet Oral Daily  . pantoprazole (PROTONIX) IV  40 mg Intravenous QHS  . pioglitazone  15 mg Oral Q breakfast   PRN:  acetaminophen, acetaminophen, labetalol  Diet:  Carb Control thin liquids Activity:  Activity as tolerated DVT Prophylaxis:  SCD  CLINICALLY SIGNIFICANT STUDIES Basic Metabolic Panel:  Recent Labs Lab 05/30/13 0015  NA 138  K 4.5  CL 104  GLUCOSE 201*  BUN 27*  CREATININE 1.50*   Liver Function Tests: No results found for this  basename: AST, ALT, ALKPHOS, BILITOT, PROT, ALBUMIN,  in the last 168 hours CBC:  Recent Labs Lab 05/30/13 0015 05/30/13 1008  WBC  --  5.6  HGB 12.6* 12.5*  HCT 37.0* 37.8*  MCV  --  85.9  PLT  --  177   Coagulation: No results found for this basename: LABPROT, INR,  in the last 168 hours Cardiac Enzymes: No results found for this basename: CKTOTAL, CKMB, CKMBINDEX, TROPONINI,  in the last 168 hours Urinalysis: No results found for this basename: COLORURINE, APPERANCEUR, LABSPEC, PHURINE, GLUCOSEU, HGBUR, BILIRUBINUR, KETONESUR, PROTEINUR, UROBILINOGEN, NITRITE, LEUKOCYTESUR,  in the last 168 hours Lipid Panel    Component Value Date/Time   CHOL  Value: 125        ATP III CLASSIFICATION:  <200     mg/dL   Desirable  161-096  mg/dL   Borderline High  >=045    mg/dL   High        03/15/8118 1010   TRIG 106 04/05/2009 1010   HDL 31* 04/05/2009 1010   CHOLHDL 4.0 04/05/2009 1010   VLDL 21 04/05/2009 1010   LDLCALC  Value: 73        Total Cholesterol/HDL:CHD Risk Coronary Heart Disease Risk Table  Men   Women  1/2 Average Risk   3.4   3.3  Average Risk       5.0   4.4  2 X Average Risk   9.6   7.1  3 X Average Risk  23.4   11.0        Use the calculated Patient Ratio above and the CHD Risk Table to determine the patient's CHD Risk.        ATP III CLASSIFICATION (LDL):  <100     mg/dL   Optimal  161-096  mg/dL   Near or Above                    Optimal  130-159  mg/dL   Borderline  045-409  mg/dL   High  >811     mg/dL   Very High 08/20/7828 5621   HgbA1C  Lab Results  Component Value Date   HGBA1C 7.9* 03/16/2013    Urine Drug Screen:   No results found for this basename: labopia, cocainscrnur, labbenz, amphetmu, thcu, labbarb    Alcohol Level: No results found for this basename: ETH,  in the last 168 hours  Ct Head Wo Contrast 05/30/2013 Small amount of subarachnoid hemorrhage in the right frontal region.   Ct Cervical Spine Wo Contrast 05/30/2013   No acute bony  abnormality.  Central disc herniation at C3-4.   These results were called to Dr. Dierdre Highman at the time of interpretation.    Mr Maxine Glenn Head Wo Contrast 05/30/2013   Unremarkable MRA intracranial circulation.    Mr Brain Wo Contrast 05/30/2013  Intracranial subarachnoid hemorrhage without visible acute infarction.  The etiology is unclear.  No occult vascular malformation is seen.  Atrophy and chronic microvascular ischemic change.  2D Echocardiogram    Carotid Doppler    CXR    EKG  normal sinus rhythm.   Therapy Recommendations   Physical Exam    Neurologic Examination:  Mental Status:  Alert, oriented, thought content appropriate. Speech fluent without evidence of aphasia. Able to follow commands without difficulty.  Cranial Nerves:  II-totally blind including inability to perceive light.  III/IV/VI-opacified corneals bilaterally. Unable to abduct as well as adduct right eye on lateral gaze; incomplete adduction of left eye on right lateral gaze  V/VII-no facial numbness; mild left lower facial weakness facial weakness.  VIII-normal.  X-normal speech.  Motor: 5/5 bilaterally with normal tone and bulk  Sensory: Normal throughout.  Deep Tendon Reflexes: Trace to 1+ in upper extremities and absent in lower extremities.  Plantars: Mute bilaterally  Cerebellar: Upper extremity coordination was normal bilaterally.  Carotid auscultation: Normal   ASSESSMENT [name] is a 79 y.o. male presenting with left hemiparesis.. Image reveals SAH without visual infarction . Infarct felt to be secondary to hypertension.  On aspirin 81 mg orally every day prior to admission. Now on no antithrombotics for secondary stroke prevention. Patient with resultant left hemiparesis. Work up underway.   Hypertension  Diabetes meillitis, 7.9  Seizure, on keppra  Blindness  Headache history   Coronary artery disease  Memory loss  [place] smoker  Hospital day #  2  TREATMENT/PLAN  Continue no antithrombotics due to [facility]  Smoking cessation counseling  Risk factor modification  At this point no physical therapy recommended at discharge, will proceeed to discharge to home.  Gwendolyn Lima. Manson Passey, [facility], MBA, MHA [facility] Pager: [phone] 05/31/2013 8:05 AM  I have personally obtained a history, examined the  patient, evaluated imaging results, and formulated the assessment and plan of care. I agree with the above. Small right frontal convexity subarachnoid hemorrhage, may be related to cerebral amyloid angiopathy, reversible cerebral vasoconstriction syndrome or posterior reversible encephalopathy syndrome.  Suanne Marker, MD 05/31/2013, 10:07 PM Certified in Neurology, Neurophysiology and Neuroimaging [facility] - Stroke Team  Please refer to [email or web address] for on-call Stroke MD

## 2013-05-31 NOTE — Evaluation (Signed)
Occupational Therapy Evaluation Patient Details Name: Eddie Graves MRN: [id number] DOB: [birth year] Today's Date: 05/31/2013 Time: 4098-1191 OT Time Calculation (min): 16 min  OT Assessment / Plan / Recommendation History of present illness  Pt is blind (baseline).   Clinical Impression   Pt admitted with numbness right UE/LE and found to have small right frontal SAH.  Pt with hx of blindness.  Pt at setup/min guard level with ADLs but feel that pt will be mod I once in his own environment (due to blindness).  No acute OT needs at this time. Will sign off.     OT Assessment  Patient does not need any further OT services    Follow Up Recommendations  No OT follow up;Supervision - Intermittent    Barriers to Discharge      Equipment Recommendations  None recommended by OT    Recommendations for Other Services    Frequency       Precautions / Restrictions Precautions Precaution Comments: pt totally blind   Pertinent Vitals/Pain See vitals    ADL  Grooming: Performed;Wash/dry face;Wash/dry hands;Supervision/safety Where Assessed - Grooming: Unsupported standing Upper Body Bathing: Simulated;Set up Where Assessed - Upper Body Bathing: Unsupported sitting Lower Body Bathing: Simulated;Set up Where Assessed - Lower Body Bathing: Unsupported sit to stand Upper Body Dressing: Simulated;Set up Where Assessed - Upper Body Dressing: Unsupported sitting Lower Body Dressing: Simulated;Set up Where Assessed - Lower Body Dressing: Unsupported sit to stand Toilet Transfer: Simulated;Min guard Toilet Transfer Method:  (ambulating) Acupuncturist:  (chair) Transfers/Ambulation Related to ADLs: min guard. Pt holding onto therapist elbow. ADL Comments:  pt near baseline. Setup assist since pt is not in familiar environement (limited by blindness in new environment).    OT Diagnosis:    OT Problem List:   OT Treatment Interventions:     OT Goals(Current goals can be found in  the care plan section) Acute Rehab OT Goals Patient Stated Goal: To return to work ASAP.  Visit Information  Last OT Received On: 05/31/13 Assistance Needed: +1       Prior Functioning     Home Living Family/patient expects to be discharged to:: Private residence Living Arrangements: Alone Available Help at Discharge: Family;Available PRN/intermittently Type of Home: House Home Access: Stairs to enter [employer] of Steps: 1 Entrance Stairs-Rails: None Home Layout: One level Home Equipment: Cane - single point;Shower seat Additional Comments: blind basically all life Prior Function Level of Independence: Independent with assistive device(s) Comments: 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; 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 Communication Communication: No difficulties Dominant Hand: Right         Vision/Perception Vision - History Baseline Vision: Legally blind Patient Visual Report: No change from baseline   Cognition  Cognition Arousal/Alertness: Awake/alert Behavior During Therapy: WFL for tasks assessed/performed Overall Cognitive Status: Within Functional Limits for tasks assessed    Extremity/Trunk Assessment Upper Extremity Assessment Upper Extremity Assessment: Overall WFL for tasks assessed     Mobility Bed Mobility Bed Mobility: Not assessed Transfers Transfers: Sit to Stand;Stand to Sit Sit to Stand: 6: Modified independent (Device/Increase time);From chair/3-in-1 Stand to Sit: 6: Modified independent (Device/Increase time);To chair/3-in-1     Exercise     Balance     End of Session OT - End of Session Equipment Utilized During Treatment: Gait belt Activity Tolerance: Patient tolerated treatment well Patient left: in chair;with call bell/phone within reach  GO    05/31/2013 Eddie Graves OTR/L  Pager [phone] Office [phone]  Eddie Graves, Eddie Graves 05/31/2013, 3:39 PM

## 2013-05-31 NOTE — Discharge Summary (Signed)
Stroke Discharge Summary  Patient ID: Eddie Graves   MRN: [id number]      DOB: August 08, 1931  Date of Admission: 05/29/2013 Date of Discharge: 05/31/2013  Attending Physician:  Darcella Cheshire, MD, Stroke MD  Consulting Physician(s):     therapy services  Patient's PCP:  Bufford Spikes, DO  Discharge Diagnoses:  Principal Problem:   SAH (subarachnoid hemorrhage): Small right frontal convexity subarachnoid hemorrhage, may be related to cerebral amyloid angiopathy, reversible cerebral vasoconstriction syndrome or posterior reversible encephalopathy syndrome  Active Problems:   Essential hypertension, benign   Other and unspecified hyperlipidemia   Type II diabetes mellitus with neurological manifestations, uncontrolled   DM (diabetes mellitus) type II controlled peripheral vascular disorder   Coronary atherosclerosis of native coronary artery   Renal insufficiency, mild   Encounter for long-term (current) use of medications  BMI: Body mass index is 32.5 kg/(m^2).  Past Medical History  Diagnosis Date  . DM (diabetes mellitus) type II controlled peripheral vascular disorder   . Essential hypertension, benign   . Blind   . Headache(784.0)   . Sebaceous cyst   . Chronic airway obstruction, not elsewhere classified   . Cough   . Spinal stenosis, lumbar region, with neurogenic claudication   . Coronary atherosclerosis of native coronary artery   . Vitamin D deficiency   . Rash and other nonspecific skin eruption   . Other and unspecified hyperlipidemia   . Anxiety state, unspecified   . Depressive disorder, not elsewhere classified   . Legal blindness, as defined in [place]   . Reflux esophagitis   . Unspecified constipation   . Pain in joint, site unspecified   . Memory loss   . Palpitations   . Type II diabetes mellitus with neurological manifestations, uncontrolled    Past Surgical History  Procedure Laterality Date  . Coronary angioplasty with stent placement    . Hernia  repair        Medication List    STOP taking these medications       aspirin EC 81 MG tablet     cilostazol 100 MG tablet  Commonly known as:  PLETAL     etodolac 400 MG 24 hr tablet  Commonly known as:  LODINE XL     GARLIQUE 400 MG Tbec  Generic drug:  Garlic     HYDROcodone-acetaminophen 10-650 MG per tablet  Commonly known as:  LORCET     metFORMIN 850 MG tablet  Commonly known as:  GLUCOPHAGE     pioglitazone-metformin 15-850 MG per tablet  Commonly known as:  ACTOPLUS MET     rosuvastatin 20 MG tablet  Commonly known as:  CRESTOR  Replaced by:  atorvastatin 40 MG tablet      TAKE these medications       acetaminophen 500 MG tablet  Commonly known as:  TYLENOL  Take 500 mg by mouth every 4 (four) hours as needed for pain. Take one tablet three times daily for pain     albuterol 108 (90 BASE) MCG/ACT inhaler  Commonly known as:  PROVENTIL HFA;VENTOLIN HFA  Inhale 2 puffs into the lungs every 6 (six) hours as needed. For wheezing     atorvastatin 40 MG tablet  Commonly known as:  LIPITOR  Take 1 tablet (40 mg total) by mouth daily at 6 PM.     esomeprazole 40 MG capsule  Commonly known as:  NEXIUM  Take 40 mg by mouth daily before breakfast.  hydrocortisone cream-nystatin cream-zinc oxide  Apply 1 application topically 2 (two) times daily.     levETIRAcetam 500 MG tablet  Commonly known as:  KEPPRA  Take 1 tablet (500 mg total) by mouth every 12 (twelve) hours.     lisinopril 5 MG tablet  Commonly known as:  PRINIVIL,ZESTRIL  Take one tablet once daily for blood pressure     metoprolol succinate 50 MG 24 hr tablet  Commonly known as:  TOPROL-XL  Take 50 mg by mouth daily. Take with or immediately following a meal.     multivitamin with minerals Tabs  Take 1 tablet by mouth daily.     nitroGLYCERIN 0.4 mg/hr  Commonly known as:  NITRODUR - Dosed in mg/24 hr  Place 1 patch onto the skin daily.     pioglitazone 15 MG tablet  Commonly known  as:  ACTOS  Take 1 tablet (15 mg total) by mouth daily with breakfast.     Prednicarbate 0.1 % Crea  Take 1 application by mouth daily.     Vitamin D 2000 UNITS Caps  Take 1 capsule by mouth daily.        LABORATORY STUDIES CBC    Component Value Date/Time   WBC 5.6 05/30/2013 1008   WBC 6.2 03/16/2013 1006   RBC 4.40 05/30/2013 1008   RBC 4.42 03/16/2013 1006   HGB 12.5* 05/30/2013 1008   HCT 37.8* 05/30/2013 1008   PLT 177 05/30/2013 1008   MCV 85.9 05/30/2013 1008   MCH 28.4 05/30/2013 1008   MCH 29.0 03/16/2013 1006   MCHC 33.1 05/30/2013 1008   MCHC 32.1 03/16/2013 1006   RDW 13.1 05/30/2013 1008   RDW 14.6 03/16/2013 1006   LYMPHSABS 2.0 03/16/2013 1006   LYMPHSABS 2.1 06/09/2011 1648   MONOABS 0.7 06/09/2011 1648   EOSABS 0.2 03/16/2013 1006   EOSABS 0.2 06/09/2011 1648   BASOSABS 0.0 03/16/2013 1006   BASOSABS 0.0 06/09/2011 1648   CMP    Component Value Date/Time   NA 138 05/30/2013 0015   NA 141 03/16/2013 1006   K 4.5 05/30/2013 0015   CL 104 05/30/2013 0015   CO2 27 03/16/2013 1006   GLUCOSE 201* 05/30/2013 0015   GLUCOSE 122* 03/16/2013 1006   BUN 27* 05/30/2013 0015   BUN 9 03/16/2013 1006   CREATININE 1.50* 05/30/2013 0015   CALCIUM 10.4* 03/16/2013 1006   PROT 7.4 03/16/2013 1006   PROT 7.3 02/28/2012 0921   ALBUMIN 3.8 02/28/2012 0921   AST 29 03/16/2013 1006   ALT 14 03/16/2013 1006   ALKPHOS 88 03/16/2013 1006   BILITOT 0.4 03/16/2013 1006   GFRNONAA 63 03/16/2013 1006   GFRAA 73 03/16/2013 1006   COAGS Lab Results  Component Value Date   INR 1.0 04/05/2009   Lipid Panel    Component Value Date/Time   CHOL  Value: 125        ATP III CLASSIFICATION:  <200     mg/dL   Desirable  295-621  mg/dL   Borderline High  >=308    mg/dL   High        6/57/8469 1010   TRIG 106 04/05/2009 1010   HDL 31* 04/05/2009 1010   CHOLHDL 4.0 04/05/2009 1010   VLDL 21 04/05/2009 1010   LDLCALC  Value: 73        Total Cholesterol/HDL:CHD Risk Coronary Heart Disease Risk Table  Men   Women  1/2 Average Risk   3.4   3.3  Average Risk       5.0   4.4  2 X Average Risk   9.6   7.1  3 X Average Risk  23.4   11.0        Use the calculated Patient Ratio above and the CHD Risk Table to determine the patient's CHD Risk.        ATP III CLASSIFICATION (LDL):  <100     mg/dL   Optimal  161-096  mg/dL   Near or Above                    Optimal  130-159  mg/dL   Borderline  045-409  mg/dL   High  >811     mg/dL   Very High 08/20/7828 5621   HgbA1C  Lab Results  Component Value Date   HGBA1C 7.9* 03/16/2013   Cardiac Panel (last 3 results) No results found for this basename: CKTOTAL, CKMB, TROPONINI, RELINDX,  in the last 72 hours Urinalysis    Component Value Date/Time   COLORURINE YELLOW 02/28/2012 1000   APPEARANCEUR CLEAR 02/28/2012 1000   LABSPEC 1.027 02/28/2012 1000   PHURINE 6.0 02/28/2012 1000   GLUCOSEU NEGATIVE 02/28/2012 1000   HGBUR NEGATIVE 02/28/2012 1000   BILIRUBINUR LARGE* 02/28/2012 1000   KETONESUR NEGATIVE 02/28/2012 1000   PROTEINUR NEGATIVE 02/28/2012 1000   UROBILINOGEN 0.2 02/28/2012 1000   NITRITE NEGATIVE 02/28/2012 1000   LEUKOCYTESUR NEGATIVE 02/28/2012 1000   Urine Drug Screen  No results found for this basename: labopia, cocainscrnur, labbenz, amphetmu, thcu, labbarb    Alcohol Level No results found for this basename: eth    SIGNIFICANT DIAGNOSTIC STUDIES  Ct Head Wo Contrast  05/30/2013 Small amount of subarachnoid hemorrhage in the right frontal region.  Ct Cervical Spine Wo Contrast  05/30/2013  No acute bony abnormality. Central disc herniation at C3-4. These results were called to Dr. Dierdre Highman at the time of interpretation.  Mr Maxine Glenn Head Wo Contrast  05/30/2013 Unremarkable MRA intracranial circulation.  Mr Brain Wo Contrast  05/30/2013 Intracranial subarachnoid hemorrhage without visible acute infarction. The etiology is unclear. No occult vascular malformation is seen. Atrophy and chronic microvascular ischemic change.    History of Present  Illness   Tradarius Reinwald is an 79 y.o. male history of diabetes mellitus, hypertension, lumbar spinal stenosis and coronary atherosclerosis, presenting with intermittent numbness involving his left arm and left leg for 3 days. He has not experienced weakness. Speech is not changed. He's been taking aspirin 81 mg per day. CT scan of his head showed a small right frontal area of subarachnoid hemorrhage. Patient has not experienced headache. Numbness involving his left extremities his been lasting for less than half an hour. Frequency has increased. NIH stroke score was 4.  LSN: 05/26/2013  tPA Given: No: Subarachnoid hemorrhage    Hospital Course   Mr. Huie Ghuman is a 79 y.o. male presenting with left hemiparesis.. Image reveals SAH without visual infarction . Infarct felt to be secondary to hypertension. On aspirin 81 mg orally every day prior to admission. Now on no antithrombotics for secondary stroke prevention. Patient with resultant left hemiparesis.   Hypertension  Diabetes meillitis, 7.9  Seizure, on keppra  Blindness  Headache history  Coronary artery disease  Memory loss  cigeratte smoker, cessation counseling Risk factor modification for diabetes  No further therapy services indicated as patient is felt to  be doing well with no further symptoms.   Discharge Exam  Blood pressure 114/61, pulse 73, temperature 98.9 F (37.2 C), temperature source Oral, resp. rate 23, height 5\' 6"  (1.676 m), weight 91.3 kg (201 lb 4.5 oz), SpO2 97.00%.   Physical Exam  Neurologic Examination:  Mental Status:  Alert, oriented, thought content appropriate. Speech fluent without evidence of aphasia. Able to follow commands without difficulty.  Cranial Nerves:  II-totally blind including inability to perceive light.  III/IV/VI-opacified corneals bilaterally. Unable to abduct as well as adduct right eye on lateral gaze; incomplete adduction of left eye on right lateral gaze  V/VII-no facial numbness;  mild left lower facial weakness facial weakness.  VIII-normal.  X-normal speech.  Motor: 5/5 bilaterally with normal tone and bulk  Sensory: Normal throughout.  Deep Tendon Reflexes: Trace to 1+ in upper extremities and absent in lower extremities.  Plantars: Mute bilaterally  Cerebellar: Upper extremity coordination was normal bilaterally.  Carotid auscultation: Normal    Discharge Diet   Carb Control thin liquids  Discharge Plan    Disposition:  home   no antithrombotics for secondary stroke prevention.  Ongoing risk factor control by Primary Care Physician. Risk factor recommendations:  Hypertension target range 130-140/70-80 Lipid range - LDL < 100 and checked every 6 months, fasting Diabetes - HgB A1C <7 Smoking cessation   Follow-up REED, TIFFANY, DO in 1 month.  Follow-up with Dr. Delia Heady, Stroke Clinic in 2 months. Please call his office to confirm if he would like to repeat head scan in 2 weeks in the event that he would need to make medication adjustments. He or nurse practioner may see you at that time.  45 minutes were spent preparing discharge.  Signed Gwendolyn Lima. Manson Passey, [facility], MBA, MHA [facility] Pager: [phone] 05/31/2013 3:47 PM   I have personally examined this patient, reviewed pertinent data and developed the plan of care. I agree with above.  Suanne Marker, MD 05/31/2013, 10:10 PM Certified in Neurology, Neurophysiology and Neuroimaging [facility] - Stroke Team  Please refer to [email or web address] for on-call Stroke MD

## 2013-06-06 ENCOUNTER — Encounter: Payer: Self-pay | Admitting: *Deleted

## 2013-06-07 ENCOUNTER — Ambulatory Visit
Admission: RE | Admit: 2013-06-07 | Discharge: 2013-06-07 | Disposition: A | Discharge status: Home / Self Care | Payer: Medicare Other | Source: Ambulatory Visit | Attending: Orthopedic Surgery | Admitting: Orthopedic Surgery

## 2013-06-07 ENCOUNTER — Other Ambulatory Visit: Payer: Self-pay | Admitting: Orthopedic Surgery

## 2013-06-07 DIAGNOSIS — M542 Cervicalgia: Secondary | ICD-10-CM

## 2013-06-08 ENCOUNTER — Ambulatory Visit (INDEPENDENT_AMBULATORY_CARE_PROVIDER_SITE_OTHER): Payer: Medicare Other | Admitting: Internal Medicine

## 2013-06-08 VITALS — BP 118/72 | HR 90 | Temp 98.1°F | Resp 18 | Ht 66.0 in | Wt 204.8 lb

## 2013-06-08 DIAGNOSIS — E1165 Type 2 diabetes mellitus with hyperglycemia: Secondary | ICD-10-CM

## 2013-06-08 DIAGNOSIS — E785 Hyperlipidemia, unspecified: Secondary | ICD-10-CM

## 2013-06-08 DIAGNOSIS — I609 Nontraumatic subarachnoid hemorrhage, unspecified: Secondary | ICD-10-CM

## 2013-06-08 DIAGNOSIS — I10 Essential (primary) hypertension: Secondary | ICD-10-CM

## 2013-06-08 DIAGNOSIS — E1149 Type 2 diabetes mellitus with other diabetic neurological complication: Secondary | ICD-10-CM

## 2013-06-08 DIAGNOSIS — E1142 Type 2 diabetes mellitus with diabetic polyneuropathy: Secondary | ICD-10-CM

## 2013-06-08 DIAGNOSIS — IMO0002 Reserved for concepts with insufficient information to code with codable children: Secondary | ICD-10-CM

## 2013-06-08 DIAGNOSIS — F329 Major depressive disorder, single episode, unspecified: Secondary | ICD-10-CM

## 2013-06-08 DIAGNOSIS — M48062 Spinal stenosis, lumbar region with neurogenic claudication: Secondary | ICD-10-CM

## 2013-06-08 DIAGNOSIS — F3289 Other specified depressive episodes: Secondary | ICD-10-CM

## 2013-06-08 NOTE — Patient Instructions (Addendum)
Go get your CT scan next week.   Follow up with Dr. Pearlean Brownie about your bleeding on the brain in 2 months  I have changed your medications to match your discharge summary and you were given a copy. I ordered labs on you today (that were done) for blood counts, electrolytes, sugar average. We gave you a copy of your discharge summary.    If you continue to have loose stools, please bring a stool sample by to determine if you are missing a pancreatic enzyme.

## 2013-06-08 NOTE — Progress Notes (Signed)
Patient ID: Eddie Graves, male   DOB: [birth year], 79 y.o.   MRN: [id number] Location:  [facility]  Code Status: full code  Allergies  Allergen Reactions  . Banana     unknown    Chief Complaint  Patient presents with  . Hospitalization Follow-up    HPI: Patient is a 79 y.o. AA male seen in the office today for hospital f/u after admission for subarachnoid hemorrhage. Legs were swelling and whole left side and shoulder, hand were plum numb.  Felt like were dissociated from his body.  Resolved.  Had subarachnoid hemorrhage.  Took him off metformin there, but kept him on actos.  I have tried to stop actos numerous times, but he keeps restarting it.  Creatinine had bumped up to 1.5 (1.1 when I'd seen him).  He has felt like he would have a headache, but never exactly got one past few days.  None of that today, but head feels tight.  Has not been taking his lipitor b/c it was in a different place.    Had neck and shoulders xrays for Dr. Montez Morita yesterday.  Has appt with him next week.    Around his low back and posterior hips is very sore some mornings.  Has known spinal stenosis of his lumbar spine.    Wants to go back to work, but is really not able to take care of his medical conditions while working and does not have adequate family support.    Review of Systems:  Review of Systems  Constitutional: Positive for malaise/fatigue. Negative for fever, chills and weight loss.  HENT: Negative for congestion and hearing loss.   Eyes:       Blind  Respiratory: Negative for cough and shortness of breath.   Cardiovascular: Negative for chest pain and leg swelling.  Gastrointestinal: Negative for heartburn and constipation.  Genitourinary: Negative for dysuria, urgency and frequency.  Musculoskeletal: Positive for back pain. Negative for falls and myalgias.  Skin: Negative for rash.  Neurological: Positive for sensory change and headaches. Negative for  dizziness, focal weakness, loss of consciousness and weakness.  Psychiatric/Behavioral: Positive for depression and memory loss. Negative for suicidal ideas.    Past Medical History  Diagnosis Date  . DM (diabetes mellitus) type II controlled peripheral vascular disorder   . Essential hypertension, benign   . Blind   . Headache(784.0)   . Sebaceous cyst   . Chronic airway obstruction, not elsewhere classified   . Cough   . Spinal stenosis, lumbar region, with neurogenic claudication   . Coronary atherosclerosis of native coronary artery   . Vitamin D deficiency   . Rash and other nonspecific skin eruption   . Other and unspecified hyperlipidemia   . Anxiety state, unspecified   . Depressive disorder, not elsewhere classified   . Legal blindness, as defined in [place]   . Reflux esophagitis   . Unspecified constipation   . Pain in joint, site unspecified   . Memory loss   . Palpitations   . Type II diabetes mellitus with neurological manifestations, uncontrolled     Past Surgical History  Procedure Laterality Date  . Coronary angioplasty with stent placement    . Hernia repair      Social History:   reports that he quit smoking about 3 months ago. He does not have any smokeless tobacco history on file. He reports that he does not drink alcohol or use illicit drugs.  Family History  Problem Relation Age of Onset  . Asthma Mother   . Diabetes Son   . Cancer Daughter   . Diabetes Son     Medications: Patient's Medications  New Prescriptions   No medications on file  Previous Medications   ACETAMINOPHEN (TYLENOL) 500 MG TABLET    Take 500 mg by mouth every 4 (four) hours as needed for pain. Take one tablet three times daily for pain   ALBUTEROL (PROVENTIL HFA;VENTOLIN HFA) 108 (90 BASE) MCG/ACT INHALER    Inhale 2 puffs into the lungs every 6 (six) hours as needed. For wheezing   ATORVASTATIN (LIPITOR) 40 MG TABLET    Take 1 tablet (40 mg total) by mouth daily at 6 PM.    CHOLECALCIFEROL (VITAMIN D) 2000 UNITS CAPS    Take 1 capsule by mouth daily.   ESOMEPRAZOLE (NEXIUM) 40 MG CAPSULE    Take 40 mg by mouth daily before breakfast.   HYDROCODONE-ACETAMINOPHEN (NORCO) 10-325 MG PER TABLET       HYDROCORTISONE CREAM-NYSTATIN CREAM-ZINC OXIDE    Apply 1 application topically 2 (two) times daily.   LEVETIRACETAM (KEPPRA) 500 MG TABLET    Take 1 tablet (500 mg total) by mouth every 12 (twelve) hours.   LISINOPRIL (PRINIVIL,ZESTRIL) 5 MG TABLET    Take one tablet once daily for blood pressure   METOPROLOL SUCCINATE (TOPROL-XL) 50 MG 24 HR TABLET    Take 50 mg by mouth daily. Take with or immediately following a meal.   MULTIPLE VITAMIN (MULITIVITAMIN WITH MINERALS) TABS    Take 1 tablet by mouth daily.   NITROGLYCERIN (NITRODUR - DOSED IN MG/24 HR) 0.4 MG/HR    Place 1 patch onto the skin daily.   PIOGLITAZONE (ACTOS) 15 MG TABLET    Take 1 tablet (15 mg total) by mouth daily with breakfast.   PIOGLITAZONE-METFORMIN (ACTOPLUS MET) 15-850 MG PER TABLET       PREDNICARBATE 0.1 % CREA    Take 1 application by mouth daily.   Modified Medications   No medications on file  Discontinued Medications   No medications on file     Physical Exam: Filed Vitals:   06/08/13 1143  BP: 118/72  Pulse: 90  Temp: 98.1 F (36.7 C)  TempSrc: Oral  Resp: 18  Height: 5\' 6"  (1.676 m)  Weight: 204 lb 12.8 oz (92.897 kg)  SpO2: 97%  Physical Exam  Constitutional: He is oriented to person, place, and time. He appears well-developed and well-nourished. No distress.  Cardiovascular: Normal rate, regular rhythm, normal heart sounds and intact distal pulses.   Pulmonary/Chest: Effort normal and breath sounds normal. No respiratory distress.  Abdominal: Soft. Bowel sounds are normal. He exhibits no distension.  Musculoskeletal: Normal range of motion. He exhibits tenderness.  Lumbar paravertebral muscles and into buttocks bilaterally, walks with sight cane  Neurological: He is alert  and oriented to person, place, and time.    Labs reviewed: Basic Metabolic Panel:  Recent Labs  16/10/96 1006 05/30/13 0015  NA 141 138  K 4.8 4.5  CL 101 104  CO2 27  --   GLUCOSE 122* 201*  BUN 9 27*  CREATININE 1.11 1.50*  CALCIUM 10.4*  --    Liver Function Tests:  Recent Labs  03/16/13 1006  AST 29  ALT 14  ALKPHOS 88  BILITOT 0.4  PROT 7.4  CBC:  Recent Labs  03/16/13 1006 05/30/13 0015 05/30/13 1008  WBC 6.2  --  5.6  NEUTROABS 3.1  --   --  HGB 12.8 12.6* 12.5*  HCT 39.9 37.0* 37.8*  MCV 90  --  85.9  PLT  --   --  177   Lab Results  Component Value Date   HGBA1C 7.9* 03/16/2013   Past Procedures: 05/29/13:  CT brain:  Small amount of subarachnoid hemorrhage in the right frontal region. 05/29/13:  Ct cervical spine:  No acute bony abnormality. Central disc herniation at C3-4. 05/30/13:  MRI brain:  Intracranial subarachnoid hemorrhage without visible acute infarction. The etiology is unclear. No occult vascular malformation is seen. Atrophy and chronic microvascular ischemic change. MRA:  Unremarkable MRA intracranial circulation. 06/06/13:  Cervical spine xrays:  Diffuse degenerative changes as discussed above - stable. Left carotid artery calcification. Assessment/Plan 1. Type II diabetes mellitus with neurological manifestations, uncontrolled - cont current insulin therapy--again encouraged appt with diabetes educator and regular glucose monitoring with help of family - Basic metabolic panel - Hemoglobin A1c  2. Spinal stenosis, lumbar region, with neurogenic claudication -continues to have chronic pain in this area, cont tylenol and hydrocodone  3. Other and unspecified hyperlipidemia -cont statin therapy  4. Essential hypertension, benign -cont current meds and recommended regular monitoring at home with help of grandson - Basic metabolic panel  5. Depressive disorder, not elsewhere classified -has refused medication for this, encouraged  again today  6. SAH (subarachnoid hemorrhage) -f/u ct brain in 2 wks and visit with Dr. Pearlean Brownie - CBC with Differential - Basic metabolic panel  Labs/tests ordered: Is for f/u CT brain in 2 wks F/u with Dr. Pearlean Brownie in 2 mos at stroke clinic  Next appt: 2 wks to review CBGs and bps

## 2013-06-09 LAB — HEMOGLOBIN A1C
Est. average glucose Bld gHb Est-mCnc: 214 mg/dL
Hgb A1c MFr Bld: 9.1 % — ABNORMAL HIGH (ref 4.8–5.6)

## 2013-06-09 LAB — CBC WITH DIFFERENTIAL/PLATELET
Basophils Absolute: 0.1 10*3/uL (ref 0.0–0.2)
Basos: 1 % (ref 0–3)
Eos: 6 % — ABNORMAL HIGH (ref 0–5)
Eosinophils Absolute: 0.4 10*3/uL (ref 0.0–0.4)
HCT: 39.1 % (ref 37.5–51.0)
Hemoglobin: 12.2 g/dL — ABNORMAL LOW (ref 12.6–17.7)
Immature Grans (Abs): 0 10*3/uL (ref 0.0–0.1)
Immature Granulocytes: 0 % (ref 0–2)
Lymphocytes Absolute: 1.7 10*3/uL (ref 0.7–3.1)
Lymphs: 28 % (ref 14–46)
MCH: 27.9 pg (ref 26.6–33.0)
MCHC: 31.2 g/dL — ABNORMAL LOW (ref 31.5–35.7)
MCV: 90 fL (ref 79–97)
Monocytes Absolute: 0.6 10*3/uL (ref 0.1–0.9)
Monocytes: 10 % (ref 4–12)
Neutrophils Absolute: 3.4 10*3/uL (ref 1.4–7.0)
Neutrophils Relative %: 55 % (ref 40–74)
RBC: 4.37 x10E6/uL (ref 4.14–5.80)
RDW: 13.4 % (ref 12.3–15.4)
WBC: 6.2 10*3/uL (ref 3.4–10.8)

## 2013-06-09 LAB — BASIC METABOLIC PANEL
BUN/Creatinine Ratio: 12 (ref 10–22)
BUN: 16 mg/dL (ref 8–27)
CO2: 18 mmol/L (ref 18–29)
Calcium: 9.4 mg/dL (ref 8.6–10.2)
Chloride: 104 mmol/L (ref 97–108)
Creatinine, Ser: 1.33 mg/dL — ABNORMAL HIGH (ref 0.76–1.27)
GFR calc Af Amer: 58 mL/min/{1.73_m2} — ABNORMAL LOW (ref >59)
GFR calc non Af Amer: 50 mL/min/{1.73_m2} — ABNORMAL LOW (ref >59)
Glucose: 271 mg/dL — ABNORMAL HIGH (ref 65–99)
Potassium: 4.5 mmol/L (ref 3.5–5.2)
Sodium: 137 mmol/L (ref 134–144)

## 2013-06-19 ENCOUNTER — Ambulatory Visit: Payer: Medicare Other | Admitting: Pharmacotherapy

## 2013-06-20 ENCOUNTER — Other Ambulatory Visit: Payer: Self-pay | Admitting: Orthopedic Surgery

## 2013-06-20 ENCOUNTER — Other Ambulatory Visit: Payer: Self-pay | Admitting: *Deleted

## 2013-06-20 ENCOUNTER — Other Ambulatory Visit: Payer: Medicare Other

## 2013-06-20 DIAGNOSIS — M542 Cervicalgia: Secondary | ICD-10-CM

## 2013-06-20 DIAGNOSIS — R197 Diarrhea, unspecified: Secondary | ICD-10-CM

## 2013-06-21 ENCOUNTER — Telehealth: Payer: Self-pay | Admitting: Neurology

## 2013-06-21 DIAGNOSIS — I609 Nontraumatic subarachnoid hemorrhage, unspecified: Secondary | ICD-10-CM

## 2013-06-21 LAB — FECAL FAT, QUALITATIVE
Fat Qual Neutral, Stl: NORMAL
Fat Qual Total, Stl: NORMAL

## 2013-06-22 ENCOUNTER — Other Ambulatory Visit: Payer: Self-pay | Admitting: Geriatric Medicine

## 2013-06-22 ENCOUNTER — Telehealth: Payer: Self-pay | Admitting: Neurology

## 2013-06-22 DIAGNOSIS — IMO0001 Reserved for inherently not codable concepts without codable children: Secondary | ICD-10-CM

## 2013-06-22 MED ORDER — ALBUTEROL SULFATE HFA 108 (90 BASE) MCG/ACT IN AERS: 2.0000 | INHALATION_SPRAY | Freq: Four times a day (QID) | RESPIRATORY_TRACT | Status: DC | PRN

## 2013-06-23 NOTE — Telephone Encounter (Signed)
I called and LMVM for brother of pt to return call, regarding CT.  I see MRI ordered per Dr. Montez Morita.

## 2013-06-26 NOTE — Telephone Encounter (Signed)
Ok to order Ct head w/wo

## 2013-06-26 NOTE — Telephone Encounter (Signed)
Spoke to son [name]. Advised will verify with Dr. Pearlean Brownie if MRI/Head should be scheduled or not per discharge summary. Son agreed.

## 2013-06-27 NOTE — Telephone Encounter (Signed)
Spoke to son [name]. Informed CT Head W/Wo ordered per Dr. Pearlean Brownie. Will be contacted by GI. Son agreed.

## 2013-07-03 ENCOUNTER — Ambulatory Visit
Admission: RE | Admit: 2013-07-03 | Discharge: 2013-07-03 | Disposition: A | Discharge status: Home / Self Care | Payer: Medicare Other | Source: Ambulatory Visit | Attending: Neurology | Admitting: Neurology

## 2013-07-03 DIAGNOSIS — I609 Nontraumatic subarachnoid hemorrhage, unspecified: Secondary | ICD-10-CM

## 2013-07-03 DIAGNOSIS — I619 Nontraumatic intracerebral hemorrhage, unspecified: Secondary | ICD-10-CM

## 2013-07-03 MED ORDER — IOHEXOL 300 MG/ML  SOLN
75.0000 mL | Freq: Once | INTRAMUSCULAR | Status: AC | PRN
  Administered 2013-07-03: 75 mL via INTRAVENOUS

## 2013-07-13 ENCOUNTER — Other Ambulatory Visit: Payer: Medicare Other

## 2013-07-17 ENCOUNTER — Ambulatory Visit (INDEPENDENT_AMBULATORY_CARE_PROVIDER_SITE_OTHER): Payer: Medicare Other | Admitting: Internal Medicine

## 2013-07-17 ENCOUNTER — Other Ambulatory Visit: Payer: Medicare Other

## 2013-07-17 ENCOUNTER — Encounter: Payer: Self-pay | Admitting: Internal Medicine

## 2013-07-17 VITALS — BP 148/90 | HR 76 | Resp 14 | Ht 66.0 in | Wt 204.8 lb

## 2013-07-17 DIAGNOSIS — E1142 Type 2 diabetes mellitus with diabetic polyneuropathy: Secondary | ICD-10-CM

## 2013-07-17 DIAGNOSIS — E1149 Type 2 diabetes mellitus with other diabetic neurological complication: Secondary | ICD-10-CM

## 2013-07-17 DIAGNOSIS — R197 Diarrhea, unspecified: Secondary | ICD-10-CM

## 2013-07-17 DIAGNOSIS — I10 Essential (primary) hypertension: Secondary | ICD-10-CM

## 2013-07-17 DIAGNOSIS — M48062 Spinal stenosis, lumbar region with neurogenic claudication: Secondary | ICD-10-CM

## 2013-07-17 DIAGNOSIS — F329 Major depressive disorder, single episode, unspecified: Secondary | ICD-10-CM

## 2013-07-17 DIAGNOSIS — E1165 Type 2 diabetes mellitus with hyperglycemia: Secondary | ICD-10-CM

## 2013-07-17 DIAGNOSIS — IMO0002 Reserved for concepts with insufficient information to code with codable children: Secondary | ICD-10-CM

## 2013-07-17 DIAGNOSIS — F3289 Other specified depressive episodes: Secondary | ICD-10-CM

## 2013-07-17 DIAGNOSIS — R413 Other amnesia: Secondary | ICD-10-CM

## 2013-07-17 MED ORDER — LISINOPRIL 10 MG PO TABS: ORAL_TABLET | ORAL | Status: DC

## 2013-07-17 NOTE — Progress Notes (Signed)
Patient ID: Eddie Graves, male   DOB: [birth year], 79 y.o.   MRN: [id number] Location:  [facility]  Code Status: full code (will discuss goals of care)  Allergies  Allergen Reactions  . Banana     unknown    Chief Complaint  Patient presents with  . Medical Managment of Chronic Issues    HPI: Patient is a 79 y.o. black male seen in the office today for med mgt chronic diseases  Not having diarrhea anymore--not exactly.  Then was constipated, now back to normal.    Last 3-4 days, once a day, getting sharp pain that lasts less than a minute over his right eye.  Happens late in the evening.  Was to go back to neurology, but has not.  CT brain done with resolution of SAH, but worsened chronic vascular ischemic changes.  Has cut back to 4 days of work.    Did take bp meds this am, but bp still elevated at 148/90.    Son is now on HD and working.  He is helping fill pillbox, but now having problems with new schedule.  Granddaughter also busy.  Daughter just had TKR.   Review of Systems:  Review of Systems  Constitutional: Positive for malaise/fatigue. Negative for fever and chills.  HENT: Negative for congestion.   Eyes:       Legally blind  Respiratory: Positive for cough. Negative for shortness of breath.   Cardiovascular: Negative for chest pain.  Gastrointestinal: Negative for heartburn, diarrhea, constipation, blood in stool and melena.  Genitourinary: Negative for dysuria.  Musculoskeletal: Positive for back pain. Negative for falls.  Skin: Negative for rash.  Neurological: Positive for tingling and sensory change. Negative for dizziness, focal weakness and headaches.  Endo/Heme/Allergies: Does not bruise/bleed easily.  Psychiatric/Behavioral: Positive for depression and memory loss.    Past Medical History  Diagnosis Date  . DM (diabetes mellitus) type II controlled peripheral vascular disorder   . Essential hypertension, benign    . Blind   . Headache(784.0)   . Sebaceous cyst   . Chronic airway obstruction, not elsewhere classified   . Cough   . Spinal stenosis, lumbar region, with neurogenic claudication   . Coronary atherosclerosis of native coronary artery   . Vitamin D deficiency   . Rash and other nonspecific skin eruption   . Other and unspecified hyperlipidemia   . Anxiety state, unspecified   . Depressive disorder, not elsewhere classified   . Legal blindness, as defined in [place]   . Reflux esophagitis   . Unspecified constipation   . Pain in joint, site unspecified   . Memory loss   . Palpitations   . Type II diabetes mellitus with neurological manifestations, uncontrolled     Past Surgical History  Procedure Laterality Date  . Coronary angioplasty with stent placement    . Hernia repair      Social History:   reports that he quit smoking about 4 months ago. He does not have any smokeless tobacco history on file. He reports that he does not drink alcohol or use illicit drugs.  Family History  Problem Relation Age of Onset  . Asthma Mother   . Diabetes Son   . Cancer Daughter   . Diabetes Son     Medications: Patient's Medications  New Prescriptions   No medications on file  Previous Medications   ACETAMINOPHEN (TYLENOL) 500 MG TABLET    Take 500 mg by mouth  every 4 (four) hours as needed for pain. Take one tablet three times daily for pain   ALBUTEROL (PROVENTIL HFA;VENTOLIN HFA) 108 (90 BASE) MCG/ACT INHALER    Inhale 2 puffs into the lungs every 6 (six) hours as needed. For wheezing   ATORVASTATIN (LIPITOR) 40 MG TABLET    Take 1 tablet (40 mg total) by mouth daily at 6 PM.   CHOLECALCIFEROL (VITAMIN D) 2000 UNITS CAPS    Take 1 capsule by mouth daily.   ESOMEPRAZOLE (NEXIUM) 40 MG CAPSULE    Take 40 mg by mouth daily before breakfast.   HYDROCODONE-ACETAMINOPHEN (NORCO) 10-325 MG PER TABLET       HYDROCORTISONE CREAM-NYSTATIN CREAM-ZINC OXIDE    Apply 1 application topically 2  (two) times daily.   LEVETIRACETAM (KEPPRA) 500 MG TABLET    Take 1 tablet (500 mg total) by mouth every 12 (twelve) hours.   LISINOPRIL (PRINIVIL,ZESTRIL) 5 MG TABLET    Take one tablet once daily for blood pressure   METOPROLOL SUCCINATE (TOPROL-XL) 50 MG 24 HR TABLET    Take 50 mg by mouth daily. Take with or immediately following a meal.   MULTIPLE VITAMIN (MULITIVITAMIN WITH MINERALS) TABS    Take 1 tablet by mouth daily.   NITROGLYCERIN (NITRODUR - DOSED IN MG/24 HR) 0.4 MG/HR    Place 1 patch onto the skin daily.   PIOGLITAZONE (ACTOS) 15 MG TABLET    Take 1 tablet (15 mg total) by mouth daily with breakfast.   PREDNICARBATE 0.1 % CREA    Take 1 application by mouth daily.   Modified Medications   No medications on file  Discontinued Medications   No medications on file     Physical Exam: Filed Vitals:   07/17/13 0950  BP: 148/90  Pulse: 76  Resp: 14  Height: 5\' 6"  (1.676 m)  Weight: 204 lb 12.8 oz (92.897 kg)  Physical Exam  Constitutional: He is oriented to person, place, and time. He appears well-developed and well-nourished. No distress.  HENT:  Head: Normocephalic and atraumatic.  Eyes:  Legally blind, uses walking stick  Neck: Normal range of motion. Neck supple.  Cardiovascular: Normal rate, regular rhythm, normal heart sounds and intact distal pulses.   Pulmonary/Chest: Effort normal and breath sounds normal.  Abdominal: Soft. Bowel sounds are normal. He exhibits no distension. There is no tenderness.  Musculoskeletal: Normal range of motion.  Tenderness over lumbar paravertebral muscles  Neurological: He is alert and oriented to person, place, and time.  Skin: Skin is warm and dry.    Labs reviewed: Basic Metabolic Panel:  Recent Labs  40/98/11 1006 05/30/13 0015 06/08/13 1306  NA 141 138 137  K 4.8 4.5 4.5  CL 101 104 104  CO2 27  --  18  GLUCOSE 122* 201* 271*  BUN 9 27* 16  CREATININE 1.11 1.50* 1.33*  CALCIUM 10.4*  --  9.4   Liver Function  Tests:  Recent Labs  03/16/13 1006  AST 29  ALT 14  ALKPHOS 88  BILITOT 0.4  PROT 7.4  CBC:  Recent Labs  03/16/13 1006 05/30/13 0015 05/30/13 1008 06/08/13 1306  WBC 6.2  --  5.6 6.2  NEUTROABS 3.1  --   --  3.4  HGB 12.8 12.6* 12.5* 12.2*  HCT 39.9 37.0* 37.8* 39.1  MCV 90  --  85.9 90  PLT  --   --  177  --    Lab Results  Component Value Date   HGBA1C  9.1* 06/08/2013   Assessment/Plan 1. Type II diabetes mellitus with neurological manifestations, uncontrolled -will try again to get him in with [facility] to help manage his glucose -his family has not been supportive with helping him out with diabetic control/mgt -he previously got a talking glucometer, but it was not used  2. Spinal stenosis, lumbar region, with neurogenic claudication -is on hydrocodone/apap for pain -improved diabetic control would help his neuropathy (also peripheral neuropathy not just claudication)  3. Diarrhea -resolved, did not seem to have pancreatic insufficiency based on fecal fat testing -seems he had a viral illness  4. Memory loss -check MMSE next visit--he notes this is worse since his subarachnoid hemorrhage which has improved on CT scan repeated after last visit  5. Depressive disorder, not elsewhere classified -continues to refuse medication -lacks motivation unless he goes to work  6.  Benign essential hypertension -increase lisinopril to 10mg  daily  Labs/tests ordered:  None today Next appt: 6 wks with MMSE, f/u with [facility] for diabetes

## 2013-07-17 NOTE — Patient Instructions (Signed)
Increase lisinopril from 5 to 10mg  daily for blood pressure  Good luck with your speech!  See you in 6 weeks--we'll evaluate your memory  I want you to see [facility] about your diabetes.

## 2013-07-24 ENCOUNTER — Encounter: Payer: Self-pay | Admitting: Pharmacotherapy

## 2013-07-24 ENCOUNTER — Ambulatory Visit (INDEPENDENT_AMBULATORY_CARE_PROVIDER_SITE_OTHER): Payer: Medicare Other | Admitting: Pharmacotherapy

## 2013-07-24 VITALS — BP 140/70 | HR 101 | Temp 97.5°F | Resp 18 | Wt 204.4 lb

## 2013-07-24 DIAGNOSIS — I10 Essential (primary) hypertension: Secondary | ICD-10-CM

## 2013-07-24 DIAGNOSIS — E1149 Type 2 diabetes mellitus with other diabetic neurological complication: Secondary | ICD-10-CM

## 2013-07-24 DIAGNOSIS — E1142 Type 2 diabetes mellitus with diabetic polyneuropathy: Secondary | ICD-10-CM

## 2013-07-24 DIAGNOSIS — IMO0002 Reserved for concepts with insufficient information to code with codable children: Secondary | ICD-10-CM

## 2013-07-24 MED ORDER — LINAGLIPTIN 5 MG PO TABS: 5.0000 mg | ORAL_TABLET | Freq: Every day | ORAL | Status: DC

## 2013-07-24 NOTE — Patient Instructions (Signed)
1.  Start Tradjenta 5mg  once daily. 2.  Continue Actos 15mg  daily. 3.  Start walking 5 minutes per day.  Goal is 30-45 minutes 5 x week. 4.  Avoid fried foods when eating out.
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
## 2013-07-31 ENCOUNTER — Other Ambulatory Visit: Payer: Medicare Other

## 2013-08-01 ENCOUNTER — Ambulatory Visit: Payer: Medicare Other | Admitting: Nurse Practitioner

## 2013-08-06 ENCOUNTER — Encounter (HOSPITAL_COMMUNITY): Payer: Self-pay | Admitting: Emergency Medicine

## 2013-08-06 ENCOUNTER — Emergency Department (HOSPITAL_COMMUNITY): Payer: Medicare Other

## 2013-08-06 ENCOUNTER — Inpatient Hospital Stay (HOSPITAL_COMMUNITY)
Admission: EM | Admit: 2013-08-06 | Discharge: 2013-08-08 | DRG: 191 | Disposition: A | Discharge status: Home / Self Care | Payer: Medicare Other | Attending: Internal Medicine | Admitting: Internal Medicine

## 2013-08-06 DIAGNOSIS — E1151 Type 2 diabetes mellitus with diabetic peripheral angiopathy without gangrene: Secondary | ICD-10-CM

## 2013-08-06 DIAGNOSIS — I609 Nontraumatic subarachnoid hemorrhage, unspecified: Secondary | ICD-10-CM

## 2013-08-06 DIAGNOSIS — Z9861 Coronary angioplasty status: Secondary | ICD-10-CM

## 2013-08-06 DIAGNOSIS — Z8673 Personal history of transient ischemic attack (TIA), and cerebral infarction without residual deficits: Secondary | ICD-10-CM

## 2013-08-06 DIAGNOSIS — Z79899 Other long term (current) drug therapy: Secondary | ICD-10-CM

## 2013-08-06 DIAGNOSIS — F329 Major depressive disorder, single episode, unspecified: Secondary | ICD-10-CM

## 2013-08-06 DIAGNOSIS — IMO0002 Reserved for concepts with insufficient information to code with codable children: Secondary | ICD-10-CM | POA: Diagnosis present

## 2013-08-06 DIAGNOSIS — Z87891 Personal history of nicotine dependence: Secondary | ICD-10-CM

## 2013-08-06 DIAGNOSIS — I10 Essential (primary) hypertension: Secondary | ICD-10-CM | POA: Diagnosis present

## 2013-08-06 DIAGNOSIS — I2510 Atherosclerotic heart disease of native coronary artery without angina pectoris: Secondary | ICD-10-CM | POA: Diagnosis present

## 2013-08-06 DIAGNOSIS — H548 Legal blindness, as defined in USA: Secondary | ICD-10-CM | POA: Diagnosis present

## 2013-08-06 DIAGNOSIS — F411 Generalized anxiety disorder: Secondary | ICD-10-CM | POA: Diagnosis present

## 2013-08-06 DIAGNOSIS — N289 Disorder of kidney and ureter, unspecified: Secondary | ICD-10-CM

## 2013-08-06 DIAGNOSIS — F3289 Other specified depressive episodes: Secondary | ICD-10-CM | POA: Diagnosis present

## 2013-08-06 DIAGNOSIS — Q619 Cystic kidney disease, unspecified: Secondary | ICD-10-CM

## 2013-08-06 DIAGNOSIS — E1149 Type 2 diabetes mellitus with other diabetic neurological complication: Secondary | ICD-10-CM

## 2013-08-06 DIAGNOSIS — E559 Vitamin D deficiency, unspecified: Secondary | ICD-10-CM | POA: Diagnosis present

## 2013-08-06 DIAGNOSIS — M48062 Spinal stenosis, lumbar region with neurogenic claudication: Secondary | ICD-10-CM | POA: Diagnosis present

## 2013-08-06 DIAGNOSIS — I798 Other disorders of arteries, arterioles and capillaries in diseases classified elsewhere: Secondary | ICD-10-CM | POA: Diagnosis present

## 2013-08-06 DIAGNOSIS — J441 Chronic obstructive pulmonary disease with (acute) exacerbation: Principal | ICD-10-CM | POA: Diagnosis present

## 2013-08-06 DIAGNOSIS — E785 Hyperlipidemia, unspecified: Secondary | ICD-10-CM | POA: Diagnosis present

## 2013-08-06 DIAGNOSIS — R413 Other amnesia: Secondary | ICD-10-CM

## 2013-08-06 DIAGNOSIS — E1159 Type 2 diabetes mellitus with other circulatory complications: Secondary | ICD-10-CM | POA: Diagnosis present

## 2013-08-06 DIAGNOSIS — IMO0001 Reserved for inherently not codable concepts without codable children: Secondary | ICD-10-CM

## 2013-08-06 DIAGNOSIS — G40909 Epilepsy, unspecified, not intractable, without status epilepticus: Secondary | ICD-10-CM | POA: Diagnosis present

## 2013-08-06 HISTORY — DX: Chronic obstructive pulmonary disease with (acute) exacerbation: J44.1

## 2013-08-06 HISTORY — DX: Personal history of other diseases of the digestive system: Z87.19

## 2013-08-06 LAB — CG4 I-STAT (LACTIC ACID): Lactic Acid, Venous: 1.57 mmol/L (ref 0.5–2.2)

## 2013-08-06 LAB — CBC
HCT: 32.9 % — ABNORMAL LOW (ref 39.0–52.0)
Hemoglobin: 11 g/dL — ABNORMAL LOW (ref 13.0–17.0)
MCH: 28.2 pg (ref 26.0–34.0)
MCHC: 33.4 g/dL (ref 30.0–36.0)
MCV: 84.4 fL (ref 78.0–100.0)
Platelets: 151 10*3/uL (ref 150–400)
RBC: 3.9 MIL/uL — ABNORMAL LOW (ref 4.22–5.81)
RDW: 14.8 % (ref 11.5–15.5)
WBC: 5.8 10*3/uL (ref 4.0–10.5)

## 2013-08-06 LAB — POCT I-STAT 3, ART BLOOD GAS (G3+)
Acid-Base Excess: 1 mmol/L (ref 0.0–2.0)
O2 Saturation: 91 %
pO2, Arterial: 61 mmHg — ABNORMAL LOW (ref 80.0–100.0)

## 2013-08-06 LAB — BASIC METABOLIC PANEL
BUN: 8 mg/dL (ref 6–23)
CO2: 23 mEq/L (ref 19–32)
Chloride: 100 mEq/L (ref 96–112)
GFR calc Af Amer: 75 mL/min — ABNORMAL LOW (ref >90)
Glucose, Bld: 491 mg/dL — ABNORMAL HIGH (ref 70–99)
Potassium: 3.1 mEq/L — ABNORMAL LOW (ref 3.5–5.1)

## 2013-08-06 LAB — GLUCOSE, CAPILLARY

## 2013-08-06 LAB — PRO B NATRIURETIC PEPTIDE: Pro B Natriuretic peptide (BNP): 268.1 pg/mL (ref 0–450)

## 2013-08-06 LAB — POCT I-STAT TROPONIN I: Troponin i, poc: 0.03 ng/mL (ref 0.00–0.08)

## 2013-08-06 LAB — D-DIMER, QUANTITATIVE: D-Dimer, Quant: 0.81 ug/mL-FEU — ABNORMAL HIGH (ref 0.00–0.48)

## 2013-08-06 MED ORDER — ADULT MULTIVITAMIN W/MINERALS CH
1.0000 | ORAL_TABLET | Freq: Every day | ORAL | Status: DC
  Administered 2013-08-07 – 2013-08-08 (×2): 1 via ORAL
  Filled 2013-08-06 (×2): qty 1

## 2013-08-06 MED ORDER — GUAIFENESIN-DM 100-10 MG/5ML PO SYRP
5.0000 mL | ORAL_SOLUTION | ORAL | Status: DC | PRN
  Filled 2013-08-06: qty 5

## 2013-08-06 MED ORDER — INSULIN ASPART 100 UNIT/ML ~~LOC~~ SOLN
10.0000 [IU] | Freq: Once | SUBCUTANEOUS | Status: AC
  Administered 2013-08-06: 10 [IU] via SUBCUTANEOUS
  Filled 2013-08-06: qty 1

## 2013-08-06 MED ORDER — SODIUM CHLORIDE 0.9 % IJ SOLN
3.0000 mL | INTRAMUSCULAR | Status: DC | PRN
  Administered 2013-08-07: 12:00:00 3 mL via INTRAVENOUS

## 2013-08-06 MED ORDER — LINAGLIPTIN 5 MG PO TABS
5.0000 mg | ORAL_TABLET | Freq: Every day | ORAL | Status: DC
  Administered 2013-08-07 – 2013-08-08 (×2): 5 mg via ORAL
  Filled 2013-08-06 (×2): qty 1

## 2013-08-06 MED ORDER — IPRATROPIUM BROMIDE 0.02 % IN SOLN
0.5000 mg | Freq: Four times a day (QID) | RESPIRATORY_TRACT | Status: DC
  Administered 2013-08-07 – 2013-08-08 (×5): 0.5 mg via RESPIRATORY_TRACT
  Filled 2013-08-06 (×6): qty 2.5

## 2013-08-06 MED ORDER — IOHEXOL 350 MG/ML SOLN
100.0000 mL | Freq: Once | INTRAVENOUS | Status: AC | PRN
  Administered 2013-08-06: 100 mL via INTRAVENOUS

## 2013-08-06 MED ORDER — NITROGLYCERIN 0.4 MG/HR TD PT24
0.4000 mg | MEDICATED_PATCH | Freq: Every day | TRANSDERMAL | Status: DC
  Administered 2013-08-06 – 2013-08-08 (×3): 0.4 mg via TRANSDERMAL
  Filled 2013-08-06 (×3): qty 1

## 2013-08-06 MED ORDER — ALBUTEROL SULFATE (5 MG/ML) 0.5% IN NEBU: 2.5000 mg | INHALATION_SOLUTION | RESPIRATORY_TRACT | Status: DC | PRN

## 2013-08-06 MED ORDER — MORPHINE SULFATE 2 MG/ML IJ SOLN: 1.0000 mg | INTRAMUSCULAR | Status: DC | PRN

## 2013-08-06 MED ORDER — SODIUM CHLORIDE 0.9 % IV SOLN: 250.0000 mL | INTRAVENOUS | Status: DC | PRN

## 2013-08-06 MED ORDER — ATORVASTATIN CALCIUM 40 MG PO TABS
40.0000 mg | ORAL_TABLET | Freq: Every day | ORAL | Status: DC
  Administered 2013-08-06 – 2013-08-07 (×2): 40 mg via ORAL
  Filled 2013-08-06 (×3): qty 1

## 2013-08-06 MED ORDER — IPRATROPIUM BROMIDE 0.02 % IN SOLN
0.5000 mg | Freq: Once | RESPIRATORY_TRACT | Status: AC
  Administered 2013-08-06: 0.5 mg via RESPIRATORY_TRACT
  Filled 2013-08-06: qty 2.5

## 2013-08-06 MED ORDER — SODIUM CHLORIDE 0.9 % IJ SOLN
3.0000 mL | Freq: Two times a day (BID) | INTRAMUSCULAR | Status: DC
  Administered 2013-08-06 – 2013-08-08 (×4): 3 mL via INTRAVENOUS

## 2013-08-06 MED ORDER — INSULIN ASPART 100 UNIT/ML ~~LOC~~ SOLN
0.0000 [IU] | Freq: Three times a day (TID) | SUBCUTANEOUS | Status: DC
Start: 2013-08-07 — End: 2013-08-07
  Administered 2013-08-07: 5 [IU] via SUBCUTANEOUS

## 2013-08-06 MED ORDER — ALBUTEROL SULFATE (5 MG/ML) 0.5% IN NEBU
2.5000 mg | INHALATION_SOLUTION | Freq: Four times a day (QID) | RESPIRATORY_TRACT | Status: DC
  Administered 2013-08-07 – 2013-08-08 (×5): 2.5 mg via RESPIRATORY_TRACT
  Filled 2013-08-06 (×6): qty 0.5

## 2013-08-06 MED ORDER — PIOGLITAZONE HCL 30 MG PO TABS
30.0000 mg | ORAL_TABLET | Freq: Every day | ORAL | Status: DC
  Administered 2013-08-07 – 2013-08-08 (×2): 30 mg via ORAL
  Filled 2013-08-06 (×3): qty 1

## 2013-08-06 MED ORDER — LISINOPRIL 10 MG PO TABS
10.0000 mg | ORAL_TABLET | Freq: Every day | ORAL | Status: DC
  Administered 2013-08-07 – 2013-08-08 (×2): 10 mg via ORAL
  Filled 2013-08-06 (×2): qty 1

## 2013-08-06 MED ORDER — METOPROLOL SUCCINATE ER 50 MG PO TB24
50.0000 mg | ORAL_TABLET | Freq: Every day | ORAL | Status: DC
  Administered 2013-08-07 – 2013-08-08 (×2): 50 mg via ORAL
  Filled 2013-08-06 (×2): qty 1

## 2013-08-06 MED ORDER — ONDANSETRON HCL 4 MG PO TABS: 4.0000 mg | ORAL_TABLET | Freq: Four times a day (QID) | ORAL | Status: DC | PRN

## 2013-08-06 MED ORDER — ALUM & MAG HYDROXIDE-SIMETH 200-200-20 MG/5ML PO SUSP: 30.0000 mL | Freq: Four times a day (QID) | ORAL | Status: DC | PRN

## 2013-08-06 MED ORDER — ENOXAPARIN SODIUM 40 MG/0.4ML ~~LOC~~ SOLN
40.0000 mg | SUBCUTANEOUS | Status: DC
  Administered 2013-08-06 – 2013-08-07 (×2): 40 mg via SUBCUTANEOUS
  Filled 2013-08-06 (×3): qty 0.4

## 2013-08-06 MED ORDER — METHYLPREDNISOLONE SODIUM SUCC 125 MG IJ SOLR
60.0000 mg | Freq: Two times a day (BID) | INTRAMUSCULAR | Status: DC
  Administered 2013-08-06 – 2013-08-07 (×2): 60 mg via INTRAVENOUS
  Filled 2013-08-06 (×4): qty 0.96

## 2013-08-06 MED ORDER — SODIUM CHLORIDE 0.9 % IV BOLUS (SEPSIS)
1000.0000 mL | Freq: Once | INTRAVENOUS | Status: AC
  Administered 2013-08-06: 1000 mL via INTRAVENOUS

## 2013-08-06 MED ORDER — LEVETIRACETAM 500 MG PO TABS
500.0000 mg | ORAL_TABLET | Freq: Two times a day (BID) | ORAL | Status: DC
  Administered 2013-08-06 – 2013-08-08 (×4): 500 mg via ORAL
  Filled 2013-08-06 (×5): qty 1

## 2013-08-06 MED ORDER — LEVOFLOXACIN IN D5W 750 MG/150ML IV SOLN
750.0000 mg | INTRAVENOUS | Status: DC
  Administered 2013-08-06 – 2013-08-07 (×2): 750 mg via INTRAVENOUS
  Filled 2013-08-06 (×3): qty 150

## 2013-08-06 MED ORDER — ACETAMINOPHEN 650 MG RE SUPP: 650.0000 mg | Freq: Four times a day (QID) | RECTAL | Status: DC | PRN

## 2013-08-06 MED ORDER — PANTOPRAZOLE SODIUM 40 MG PO TBEC
40.0000 mg | DELAYED_RELEASE_TABLET | Freq: Every day | ORAL | Status: DC
  Administered 2013-08-07 – 2013-08-08 (×2): 40 mg via ORAL
  Filled 2013-08-06 (×2): qty 1

## 2013-08-06 MED ORDER — HYDROCODONE-ACETAMINOPHEN 5-325 MG PO TABS: 1.0000 | ORAL_TABLET | ORAL | Status: DC | PRN

## 2013-08-06 MED ORDER — PREDNISONE 20 MG PO TABS
60.0000 mg | ORAL_TABLET | Freq: Once | ORAL | Status: AC
  Administered 2013-08-06: 60 mg via ORAL
  Filled 2013-08-06: qty 3

## 2013-08-06 MED ORDER — ALBUTEROL SULFATE (5 MG/ML) 0.5% IN NEBU
5.0000 mg | INHALATION_SOLUTION | Freq: Once | RESPIRATORY_TRACT | Status: AC
  Administered 2013-08-06: 5 mg via RESPIRATORY_TRACT
  Filled 2013-08-06: qty 1

## 2013-08-06 MED ORDER — ONDANSETRON HCL 4 MG/2ML IJ SOLN: 4.0000 mg | Freq: Four times a day (QID) | INTRAMUSCULAR | Status: DC | PRN

## 2013-08-06 MED ORDER — ACETAMINOPHEN 325 MG PO TABS: 650.0000 mg | ORAL_TABLET | Freq: Four times a day (QID) | ORAL | Status: DC | PRN

## 2013-08-06 NOTE — ED Notes (Signed)
Pulse -Ox while ambulating is 89% RA.Dr Manus Gunning notified.

## 2013-08-06 NOTE — ED Notes (Signed)
Pt's CBG is 230mg /dl

## 2013-08-06 NOTE — H&P (Signed)
PATIENT DETAILS Name: Eddie Graves Age: 79 y.o. Sex: male Date of Birth: September 29, 1931 Admit Date: 08/06/2013 GNF:AOZH, TIFFANY, DO   CHIEF COMPLAINT:  Shortness of breath and cough for 2 weeks  HPI: Eddie Graves is a 79 y.o. male with a Past Medical History of bilateral blindness, long-standing history of tobacco abuse who quit 2 months ago, recent history of a subarachnoid hemorrhage, history of coronary disease status post PCI-last PCI 2 years ago who presents today with the above noted complaint. Per patient, he always has shortness of breath which is mild, however for the past 2 weeks this has gotten progressively worse. It is exclusively on exertion. He does not have paroxysmal nocturnal dyspnea orthopnea. He has a associated cough with productive phlegm, no hemoptysis. He denies any fever chest pain. He is mild chronic bilateral leg edema. He presented to the ED for further evaluation of his shortness of breath, a CT scan of the chest was negative for pulmonary embolism. He was found to be wheezing, and thought to have a COPD exacerbation. I was subsequently asked to admit this patient for further evaluation and treatment.   ALLERGIES:   Allergies  Allergen Reactions  . Banana     unknown    PAST MEDICAL HISTORY: Past Medical History  Diagnosis Date  . DM (diabetes mellitus) type II controlled peripheral vascular disorder   . Essential hypertension, benign   . Blind   . Headache(784.0)   . Sebaceous cyst   . Chronic airway obstruction, not elsewhere classified   . Cough   . Spinal stenosis, lumbar region, with neurogenic claudication   . Coronary atherosclerosis of native coronary artery   . Vitamin D deficiency   . Rash and other nonspecific skin eruption   . Other and unspecified hyperlipidemia   . Anxiety state, unspecified   . Depressive disorder, not elsewhere classified   . Legal blindness, as defined in [place]   . Reflux esophagitis   . Unspecified constipation   .  Pain in joint, site unspecified   . Memory loss   . Palpitations   . Type II diabetes mellitus with neurological manifestations, uncontrolled     PAST SURGICAL HISTORY: Past Surgical History  Procedure Laterality Date  . Coronary angioplasty with stent placement    . Hernia repair      MEDICATIONS AT HOME: Prior to Admission medications   Medication Sig Start Date End Date Taking? Authorizing Provider  acetaminophen (TYLENOL) 500 MG tablet Take 500 mg by mouth every 8 (eight) hours as needed for pain.    Yes Historical Provider, MD  albuterol (PROVENTIL HFA;VENTOLIN HFA) 108 (90 BASE) MCG/ACT inhaler Inhale 2 puffs into the lungs every 6 (six) hours as needed for wheezing or shortness of breath.   Yes Historical Provider, MD  atorvastatin (LIPITOR) 40 MG tablet Take 1 tablet (40 mg total) by mouth daily at 6 PM. 05/31/13  Yes Cathlyn Parsons, PA-C  Cholecalciferol (VITAMIN D) 2000 UNITS CAPS Take 1 capsule by mouth daily.   Yes Historical Provider, MD  cilostazol (PLETAL) 100 MG tablet Take 100 mg by mouth 2 (two) times daily.  07/17/13  Yes Historical Provider, MD  EFFIENT 10 MG TABS tablet Take 10 mg by mouth daily.  07/17/13  Yes Historical Provider, MD  esomeprazole (NEXIUM) 40 MG capsule Take 40 mg by mouth daily before breakfast.   Yes Historical Provider, MD  HYDROcodone-acetaminophen (NORCO) 10-325 MG per tablet Take 2 tablets by mouth 2 (two) times daily.  04/30/13  Yes Historical Provider, MD  hydrocortisone cream-nystatin cream-zinc oxide Apply 1 application topically 2 (two) times daily. 04/17/13  Yes Tiffany L Reed, DO  levETIRAcetam (KEPPRA) 500 MG tablet Take 1 tablet (500 mg total) by mouth every 12 (twelve) hours. 05/31/13  Yes Cathlyn Parsons, PA-C  linagliptin (TRADJENTA) 5 MG TABS tablet Take 1 tablet (5 mg total) by mouth daily. 07/24/13  Yes Edison Pace, RPH-CPP  lisinopril (PRINIVIL,ZESTRIL) 10 MG tablet Take 10 mg by mouth daily.   Yes Historical Provider, MD  metoprolol  succinate (TOPROL-XL) 50 MG 24 hr tablet Take 50 mg by mouth daily.    Yes Historical Provider, MD  Multiple Vitamin (MULITIVITAMIN WITH MINERALS) TABS Take 1 tablet by mouth daily.   Yes Historical Provider, MD  nitroGLYCERIN (NITRODUR - DOSED IN MG/24 HR) 0.4 mg/hr Place 1 patch onto the skin daily.   Yes Historical Provider, MD  pioglitazone (ACTOS) 15 MG tablet Take 1 tablet (15 mg total) by mouth daily with breakfast. 05/31/13  Yes Cathlyn Parsons, PA-C  Prednicarbate 0.1 % CREA Take 1 application by mouth daily.  03/22/13  Yes Historical Provider, MD    FAMILY HISTORY: Family History  Problem Relation Age of Onset  . Asthma Mother   . Diabetes Son   . Cancer Daughter   . Diabetes Son     SOCIAL HISTORY:  reports that he quit smoking about 5 months ago. He does not have any smokeless tobacco history on file. He reports that he does not drink alcohol or use illicit drugs.  REVIEW OF SYSTEMS:  Constitutional:   No  weight loss, night sweats,  Fevers, chills, fatigue.  HEENT:    No headaches, Difficulty swallowing,Tooth/dental problems,Sore throat,  No sneezing, itching, ear ache, nasal congestion, post nasal drip,   Cardio-vascular: No chest pain,  Orthopnea, PND, anasarca, dizziness, palpitations  GI:  No heartburn, indigestion, abdominal pain, nausea, vomiting, diarrhea, change in bowel habits, loss of appetite  Resp: No shortness of breath at rest.  No excess mucus, No coughing up of blood.No change in color of mucus.No chest wall deformity  Skin:  no rash or lesions.  GU:  no dysuria, change in color of urine, no urgency or frequency.  No flank pain.  Musculoskeletal: No joint pain or swelling.  No decreased range of motion.  No back pain.  Psych: No change in mood or affect. No depression or anxiety.  No memory loss.   PHYSICAL EXAM: Blood pressure 159/82, pulse 94, temperature 98.2 F (36.8 C), temperature source Oral, resp. rate 21, height 5\' 6"  (1.676 m),  weight 92.534 kg (204 lb), SpO2 94.00%.  General appearance :Awake, alert, not in any distress. Speech Clear. Not toxic Looking HEENT: Atraumatic and Normocephalic, pupils equally reactive to light and accomodation Neck: supple, no JVD. No cervical lymphadenopathy.  Chest:Good air entry bilaterally, coarse rhonchi all over. CVS: S1 S2 regular, no murmurs.  Abdomen: Bowel sounds present, Non tender and not distended with no gaurding, rigidity or rebound. Extremities: B/L Lower Ext shows trace edema, both legs are warm to touch Neurology: Awake alert, and oriented X 3, CN II-XII intact, Non focal Skin:No Rash Wounds:N/A  LABS ON ADMISSION:   Recent Labs  08/06/13 1128  NA 136  K 3.1*  CL 100  CO2 23  GLUCOSE 491*  BUN 8  CREATININE 1.05  CALCIUM 9.3   No results found for this basename: AST, ALT, ALKPHOS, BILITOT, PROT, ALBUMIN,  in the last 72 hours  No results found for this basename: LIPASE, AMYLASE,  in the last 72 hours  Recent Labs  08/06/13 1128  WBC 5.8  HGB 11.0*  HCT 32.9*  MCV 84.4  PLT 151   No results found for this basename: CKTOTAL, CKMB, CKMBINDEX, TROPONINI,  in the last 72 hours  Recent Labs  08/06/13 1337  DDIMER 0.81*   No components found with this basename: POCBNP,    RADIOLOGIC STUDIES ON ADMISSION: Dg Chest 2 View (if Patient Has Fever And/or Copd)  08/06/2013   *RADIOLOGY REPORT*  Clinical Data: Cough and shortness of breath.  CHEST - 2 VIEW  Comparison: 02/28/2012 chest radiograph  Findings: The cardiomediastinal silhouette is unremarkable. COPD/emphysema again noted. There is no evidence of focal airspace disease, pulmonary edema, suspicious pulmonary nodule/mass, pleural effusion, or pneumothorax. No acute bony abnormalities are identified.  IMPRESSION: No evidence of active cardiopulmonary disease.   Original Report Authenticated By: Harmon Pier, M.D.   Ct Angio Chest Pe W/cm &/or Wo Cm  08/06/2013   **ADDENDUM** CREATED: 08/06/2013  17:14:22  Not mentioned in the initial report, there is a 3 cm right renal cyst in the upper pole.  There are also mildly enlarged celiac axis lymph nodes, the largest measuring 9 mm in short axis.  There is also and aortocaval lymph node on image number 53 series 5, measuring 11 mm in short axis.  These findings are consistent with mild nonspecific abdominal adenopathy.  There is also a small pericardial effusion measuring up to 9 mm along the right heart border, measuring only about 3 mm elsewhere however.  **END ADDENDUM** SIGNED BY: Esperanza Heir, M.D.  08/06/2013   *RADIOLOGY REPORT*  Clinical Data: chest pain, shortness  of breaath, productive cough  CT ANGIOGRAPHY CHEST  Technique:  Multidetector CT imaging of the chest using the standard protocol during bolus administration of intravenous contrast. Multiplanar reconstructed images including MIPs were obtained and reviewed to evaluate the vascular anatomy.  Contrast: OMNIPAQUE IOHEXOL 350 MG/ML SOLN  Comparison: None.  Findings: There are no filling defects in the pulmonary arterial system.  The thoracic aorta shows no evidence of dissection or dilatation.  There is calcification of the aorta and of coronary arteries.  There is no consolidation or effusion.  There is mild bilateral perihilar bronchiectasis.  There is mild diffuse centrilobular emphysematous change.  There is a small partially calcified 11 mm subcarinal lymph node.  In the right hilum, there is abnormal soft tissue with calcifications within it measuring 11 x 28 mm.  This is also associated with more subtle branching soft tissue abnormality that follows in the right lower lobe bronchus.  IMPRESSION: No pulmonary embolism.  Abnormal right hilar soft tissue likely reflects chronic adenopathy given the presence of calcification. This could indicate prior granulomatous exposure.  There is mild COPD.   Original Report Authenticated By: Esperanza Heir, M.D.     EKG: Independently  reviewed. nsr  ASSESSMENT AND PLAN: Present on Admission:  . COPD exacerbation - Admit to a medical surgical unit  - Start on IV Solu-Medrol, nebulized bronchodilators and IV Levaquin  -Currently stable, without accessory muscle use, easily able to speak in full sentences-reassess tomorrow.  - Continue to encourage tobacco cessation (he has not smoked for 2 months) -Suspect needs a formal pulmonary function test upon discharge, he can be referred to the pulmonology clinic by his PCP   . Essential hypertension, benign - Continue lisinopril, metoprolol and transdermal nitroglycerin   . history of SAH (  subarachnoid hemorrhage) - Currently stable, without headaches, moving all 4 extremities   . Type II diabetes mellitus with neurological manifestations, uncontrolled - Continue with Actos and tradjenta - Spoke to his son [name] on the phone, who helps him with his medications, he claims that the patient is not taking Actos  - I suspect, uncontrolled hyperglycemia and may be from noncompliance to medications secondary to the patient being blind and he may not be getting the right medications   . Spinal stenosis, lumbar region, with neurogenic claudication - As needed   . Coronary atherosclerosis of native coronary artery - Spoke with son [name], patient is not on Effient - Currently chest pain-free, shortness of breath likely secondary to COPD exacerbation as patient wheezing  . History of seizures - Continue with Keppra  . Mild mediastinal lymphadenopathy - Suspect this is likely reactive, further workup if needed can be done in the outpatient setting  . Small pericardial effusion - This is seen on CT scan, we'll get a 2-D echocardiogram if needed.  . 3 cm right renal cyst - She seen in the CT scan of the chest, we'll defer further workup to the outpatient setting, if deemed necessary by the patient's primary care practitioner  Further plan will depend as patient's clinical  course evolves and further radiologic and laboratory data become available. Patient will be monitored closely.   DVT Prophylaxis: Prophylactic Lovenox   Code Status: Full Code  Total time spent for admission equals 45 minutes.  [facility] Pager [phone]  If 7PM-7AM, please contact night-coverage [email or web address] Password [facility] 08/06/2013, 6:17 PM

## 2013-08-06 NOTE — ED Notes (Signed)
Rancour, MD at bedside. 

## 2013-08-06 NOTE — ED Notes (Signed)
Pt reports shortness of breath x 1 week. Pt reports productive cough at night. Pt reports chest pain when coughing.

## 2013-08-06 NOTE — ED Provider Notes (Signed)
CSN: [id number]     Arrival date & time 08/06/13  1049 History   First MD Initiated Contact with Patient 08/06/13 1157     Chief Complaint  Patient presents with  . Shortness of Breath   (Consider location/radiation/quality/duration/timing/severity/associated sxs/prior Treatment) HPI Comments: Patient presents with a one-month history of progressive worsening shortness of breath with a dry cough. He has chest tightness with coughing. He becomes very short of breath with ambulation and feels that he can't get around as well as she normally does. He denies any history of COPD or CHF. He does have an extensive smoking history. He denies any fevers, weight loss, abdominal pain, nausea vomiting. His breathing is worse with exertion. He denies exertional chest pain.  The history is provided by the patient.    Past Medical History  Diagnosis Date  . DM (diabetes mellitus) type II controlled peripheral vascular disorder   . Essential hypertension, benign   . Blind   . Headache(784.0)   . Sebaceous cyst   . Chronic airway obstruction, not elsewhere classified   . Cough   . Spinal stenosis, lumbar region, with neurogenic claudication   . Coronary atherosclerosis of native coronary artery   . Vitamin D deficiency   . Rash and other nonspecific skin eruption   . Other and unspecified hyperlipidemia   . Anxiety state, unspecified   . Depressive disorder, not elsewhere classified   . Legal blindness, as defined in [place]   . Reflux esophagitis   . Unspecified constipation   . Pain in joint, site unspecified   . Memory loss   . Palpitations   . Type II diabetes mellitus with neurological manifestations, uncontrolled    Past Surgical History  Procedure Laterality Date  . Coronary angioplasty with stent placement    . Hernia repair     Family History  Problem Relation Age of Onset  . Asthma Mother   . Diabetes Son   . Cancer Daughter   . Diabetes Son    History  Substance Use Topics  .  Smoking status: Former Smoker -- 40 years    Quit date: 03/07/2013  . Smokeless tobacco: Not on file  . Alcohol Use: No    Review of Systems  Constitutional: Positive for activity change and appetite change.  HENT: Negative for congestion and rhinorrhea.   Eyes: Negative for visual disturbance.  Respiratory: Positive for cough, chest tightness and shortness of breath.   Cardiovascular: Positive for leg swelling. Negative for chest pain.  Gastrointestinal: Negative for nausea, vomiting and abdominal pain.  Genitourinary: Negative for dysuria.  Musculoskeletal: Negative for myalgias, back pain and arthralgias.  Skin: Negative for rash.  Neurological: Negative for dizziness, weakness and headaches.  A complete 10 system review of systems was obtained and all systems are negative except as noted in the HPI and PMH.    Allergies  Banana  Home Medications   No current outpatient prescriptions on file. BP 159/82  Pulse 94  Temp(Src) 98.2 F (36.8 C) (Oral)  Resp 21  Ht 5\' 6"  (1.676 m)  Wt 204 lb (92.534 kg)  BMI 32.94 kg/m2  SpO2 94% Physical Exam  Constitutional: He is oriented to person, place, and time. He appears well-developed and well-nourished. No distress.  HENT:  Head: Normocephalic and atraumatic.  Mouth/Throat: Oropharynx is clear and moist. No oropharyngeal exudate.  Eyes: Conjunctivae and EOM are normal. Pupils are equal, round, and reactive to light.  Neck: Normal range of motion. Neck supple.  Cardiovascular:  Normal rate, regular rhythm and normal heart sounds.   No murmur heard. Pulmonary/Chest: Effort normal. He has wheezes.  Decreased breath sounds at bases with scattered wheezing  Abdominal: Soft. There is no tenderness. There is no rebound and no guarding.  Musculoskeletal: Normal range of motion. He exhibits no edema and no tenderness.  Neurological: He is alert and oriented to person, place, and time. No cranial nerve deficit. He exhibits normal muscle  tone. Coordination normal.  Skin: Skin is warm.    ED Course  Procedures (including critical care time) Labs Review Labs Reviewed  CBC - Abnormal; Notable for the following:    RBC 3.90 (*)    Hemoglobin 11.0 (*)    HCT 32.9 (*)    All other components within normal limits  BASIC METABOLIC PANEL - Abnormal; Notable for the following:    Potassium 3.1 (*)    Glucose, Bld 491 (*)    GFR calc non Af Amer 65 (*)    GFR calc Af Amer 75 (*)    All other components within normal limits  D-DIMER, QUANTITATIVE - Abnormal; Notable for the following:    D-Dimer, Quant 0.81 (*)    All other components within normal limits  GLUCOSE, CAPILLARY - Abnormal; Notable for the following:    Glucose-Capillary 230 (*)    All other components within normal limits  POCT I-STAT 3, BLOOD GAS (G3+) - Abnormal; Notable for the following:    pO2, Arterial 61.0 (*)    Bicarbonate 25.4 (*)    All other components within normal limits  PRO B NATRIURETIC PEPTIDE  KETONES, QUALITATIVE  POCT I-STAT TROPONIN I  CG4 I-STAT (LACTIC ACID)   Imaging Review Dg Chest 2 View (if Patient Has Fever And/or Copd)  08/06/2013   *RADIOLOGY REPORT*  Clinical Data: Cough and shortness of breath.  CHEST - 2 VIEW  Comparison: 02/28/2012 chest radiograph  Findings: The cardiomediastinal silhouette is unremarkable. COPD/emphysema again noted. There is no evidence of focal airspace disease, pulmonary edema, suspicious pulmonary nodule/mass, pleural effusion, or pneumothorax. No acute bony abnormalities are identified.  IMPRESSION: No evidence of active cardiopulmonary disease.   Original Report Authenticated By: Harmon Pier, M.D.   Ct Angio Chest Pe W/cm &/or Wo Cm  08/06/2013   **ADDENDUM** CREATED: 08/06/2013 17:14:22  Not mentioned in the initial report, there is a 3 cm right renal cyst in the upper pole.  There are also mildly enlarged celiac axis lymph nodes, the largest measuring 9 mm in short axis.  There is also and aortocaval  lymph node on image number 53 series 5, measuring 11 mm in short axis.  These findings are consistent with mild nonspecific abdominal adenopathy.  There is also a small pericardial effusion measuring up to 9 mm along the right heart border, measuring only about 3 mm elsewhere however.  **END ADDENDUM** SIGNED BY: Esperanza Heir, M.D.  08/06/2013   *RADIOLOGY REPORT*  Clinical Data: chest pain, shortness  of breaath, productive cough  CT ANGIOGRAPHY CHEST  Technique:  Multidetector CT imaging of the chest using the standard protocol during bolus administration of intravenous contrast. Multiplanar reconstructed images including MIPs were obtained and reviewed to evaluate the vascular anatomy.  Contrast: OMNIPAQUE IOHEXOL 350 MG/ML SOLN  Comparison: None.  Findings: There are no filling defects in the pulmonary arterial system.  The thoracic aorta shows no evidence of dissection or dilatation.  There is calcification of the aorta and of coronary arteries.  There is no consolidation or  effusion.  There is mild bilateral perihilar bronchiectasis.  There is mild diffuse centrilobular emphysematous change.  There is a small partially calcified 11 mm subcarinal lymph node.  In the right hilum, there is abnormal soft tissue with calcifications within it measuring 11 x 28 mm.  This is also associated with more subtle branching soft tissue abnormality that follows in the right lower lobe bronchus.  IMPRESSION: No pulmonary embolism.  Abnormal right hilar soft tissue likely reflects chronic adenopathy given the presence of calcification. This could indicate prior granulomatous exposure.  There is mild COPD.   Original Report Authenticated By: Esperanza Heir, M.D.    MDM   1. COPD exacerbation   2. Coronary atherosclerosis of native coronary artery   3. DM (diabetes mellitus) type II controlled peripheral vascular disorder   4. Essential hypertension, benign    Progressively worsening shortness of breath, PND,  chest tightness with coughing.  EKG unchanged. Chest x-ray negative for pneumonia. Labs remarkable for hyperglycemia without DKA  Patient given nebulizers and steroids for presumed COPD exacerbation. No CO2 retention on ABG. He is mild tachycardia to the 100s. Oxygenation is 94%. An ambulation he drops to 88%.  CT scan is negative for PE. Patient is blind and lives alone. Feels he would benefit from admission for COPD exacerbation.   Date: 08/06/2013  Rate: 103  Rhythm: sinus tachycardia  QRS Axis: left  Intervals: normal  ST/T Wave abnormalities: nonspecific ST/T changes  Conduction Disutrbances:none  Narrative Interpretation:   Old EKG Reviewed: unchanged      Glynn Octave, MD 08/06/13 [phone]

## 2013-08-06 NOTE — ED Notes (Signed)
Shown egk to dr.goldston.

## 2013-08-07 ENCOUNTER — Encounter (HOSPITAL_COMMUNITY): Payer: Self-pay | Admitting: Family

## 2013-08-07 DIAGNOSIS — E785 Hyperlipidemia, unspecified: Secondary | ICD-10-CM

## 2013-08-07 DIAGNOSIS — I609 Nontraumatic subarachnoid hemorrhage, unspecified: Secondary | ICD-10-CM

## 2013-08-07 LAB — GLUCOSE, CAPILLARY: Glucose-Capillary: 143 mg/dL — ABNORMAL HIGH (ref 70–99)

## 2013-08-07 LAB — HEMOGLOBIN A1C
Hgb A1c MFr Bld: 11.4 % — ABNORMAL HIGH (ref <5.7)
Mean Plasma Glucose: 280 mg/dL — ABNORMAL HIGH (ref <117)

## 2013-08-07 LAB — COMPREHENSIVE METABOLIC PANEL
ALT: 22 U/L (ref 0–53)
BUN: 11 mg/dL (ref 6–23)
CO2: 25 mEq/L (ref 19–32)
Calcium: 9.8 mg/dL (ref 8.4–10.5)
Creatinine, Ser: 0.98 mg/dL (ref 0.50–1.35)
GFR calc Af Amer: 88 mL/min — ABNORMAL LOW (ref >90)
GFR calc non Af Amer: 76 mL/min — ABNORMAL LOW (ref >90)
Glucose, Bld: 297 mg/dL — ABNORMAL HIGH (ref 70–99)

## 2013-08-07 LAB — CBC
Hemoglobin: 12.2 g/dL — ABNORMAL LOW (ref 13.0–17.0)
MCHC: 32.2 g/dL (ref 30.0–36.0)
RBC: 4.51 MIL/uL (ref 4.22–5.81)

## 2013-08-07 MED ORDER — PREDNISONE 10 MG PO TABS: ORAL_TABLET | ORAL | Status: DC

## 2013-08-07 MED ORDER — TIOTROPIUM BROMIDE MONOHYDRATE 18 MCG IN CAPS: 18.0000 ug | ORAL_CAPSULE | Freq: Every day | RESPIRATORY_TRACT | Status: DC

## 2013-08-07 MED ORDER — TIOTROPIUM BROMIDE MONOHYDRATE 18 MCG IN CAPS
18.0000 ug | ORAL_CAPSULE | Freq: Every day | RESPIRATORY_TRACT | Status: DC
  Administered 2013-08-08: 08:00:00 18 ug via RESPIRATORY_TRACT
  Filled 2013-08-07: qty 5

## 2013-08-07 MED ORDER — PREDNISONE 20 MG PO TABS
40.0000 mg | ORAL_TABLET | Freq: Every day | ORAL | Status: DC
  Administered 2013-08-08: 40 mg via ORAL
  Filled 2013-08-07 (×2): qty 2

## 2013-08-07 MED ORDER — ALBUTEROL SULFATE HFA 108 (90 BASE) MCG/ACT IN AERS: 2.0000 | INHALATION_SPRAY | RESPIRATORY_TRACT | Status: DC | PRN

## 2013-08-07 MED ORDER — INSULIN ASPART 100 UNIT/ML ~~LOC~~ SOLN
0.0000 [IU] | Freq: Three times a day (TID) | SUBCUTANEOUS | Status: DC
  Administered 2013-08-07: 17:00:00 2 [IU] via SUBCUTANEOUS
  Administered 2013-08-08: 8 [IU] via SUBCUTANEOUS

## 2013-08-07 MED ORDER — METFORMIN HCL 500 MG PO TABS
500.0000 mg | ORAL_TABLET | Freq: Two times a day (BID) | ORAL | Status: DC
  Administered 2013-08-07 – 2013-08-08 (×2): 500 mg via ORAL
  Filled 2013-08-07 (×4): qty 1

## 2013-08-07 MED ORDER — LEVETIRACETAM 500 MG PO TABS: 500.0000 mg | ORAL_TABLET | Freq: Two times a day (BID) | ORAL | Status: DC

## 2013-08-07 MED ORDER — PIOGLITAZONE HCL 15 MG PO TABS: 30.0000 mg | ORAL_TABLET | Freq: Every day | ORAL | Status: DC

## 2013-08-07 MED ORDER — INSULIN ASPART 100 UNIT/ML ~~LOC~~ SOLN
20.0000 [IU] | Freq: Once | SUBCUTANEOUS | Status: AC
  Administered 2013-08-07: 12:00:00 20 [IU] via SUBCUTANEOUS

## 2013-08-07 MED ORDER — GUAIFENESIN-DM 100-10 MG/5ML PO SYRP: 5.0000 mL | ORAL_SOLUTION | ORAL | Status: DC | PRN

## 2013-08-07 MED ORDER — HYDRALAZINE HCL 20 MG/ML IJ SOLN
10.0000 mg | Freq: Once | INTRAMUSCULAR | Status: AC
  Administered 2013-08-07: 10 mg via INTRAVENOUS
  Filled 2013-08-07: qty 1

## 2013-08-07 NOTE — Progress Notes (Signed)
Nutrition Brief Note  Patient identified on the Malnutrition Screening Tool (MST) Report for unsure of any recent weight loss. No significant weight loss noted on review of usual weights.  Wt Readings from Last 15 Encounters:  08/07/13 198 lb 3.2 oz (89.903 kg)  07/24/13 204 lb 6.4 oz (92.715 kg)  07/17/13 204 lb 12.8 oz (92.897 kg)  06/08/13 204 lb 12.8 oz (92.897 kg)  05/30/13 201 lb 4.5 oz (91.3 kg)  04/17/13 200 lb (90.719 kg)  03/16/13 203 lb 9.6 oz (92.352 kg)  12/03/12 203 lb (92.08 kg)    Body mass index is 32.01 kg/(m^2). Patient meets criteria for obesity, class 1 based on current BMI.   Current diet order is CHO-modified, low sodium, patient is tolerating diet well at this time. Labs and medications reviewed.   No nutrition interventions warranted at this time. If nutrition issues arise, please consult RD.   Joaquin Courts, RD, LDN, CNSC Pager [phone] After Hours Pager [phone]

## 2013-08-07 NOTE — Progress Notes (Addendum)
PATIENT DETAILS Name: Eddie Graves Age: 79 y.o. Sex: male Date of Birth: 07/20/1931 Admit Date: 08/06/2013 Admitting Physician Dewayne Shorter Levora Dredge, MD ZOX:[id number], TIFFANY, DO  Subjective: Shortness of breath has resolved  Assessment/Plan: Principal Problem:   COPD exacerbation -better -change solumedrol to prednisone -c/w Nebs and Levaquin  Active Problems:   Essential hypertension, benign -controlled -Continue lisinopril, metoprolol and transdermal nitroglycerin   History of SAH (subarachnoid hemorrhage)  - Currently stable, without headaches, moving all 4 extremities  - Per family, he is not taking effient anymore, since his recent subarachnoid hemorrhage  Type II diabetes mellitus with neurological manifestations, uncontrolled  - Continue with Actos and tradjenta  - Spoke to his son [name] on the phone, who helps him with his medications, he claims that the patient is not taking Actos, review of outpatient MD/Pharm notes indicate, patient to be on both Actos and Tradjenta - I suspect, uncontrolled hyperglycemia and may be from noncompliance/confusion secondary to the patient being blind and he may not be getting the right medications as his family actually dispense the meds for him - Will continue with Actos and tradjenta, add back Metformin-as A1C 11.4. Unfortunately patient lives alone and is blind, not a ideal scenario to start Insulin -c/w SSI-change to moderate scale-CBG's elevated here in the hospital from being on Solumedrol  Spinal stenosis, lumbar region, with neurogenic claudication  - As needed narcotics   . Coronary atherosclerosis of native coronary artery  - Spoke with son [name], patient is not on Effient  - Currently chest pain-free, shortness of breath likely secondary to COPD exacerbation as patient wheezing   . History of seizures  - Continue with Keppra   . Mild mediastinal lymphadenopathy  - Suspect this is likely reactive, further workup if needed  can be done in the outpatient setting   . Small pericardial effusion  - This is seen on CT scan, 2-D echocardiogram done this admission, however did not show any effusion.   . 3 cm right renal cyst  - She seen in the CT scan of the chest, we'll defer further workup to the outpatient setting, if deemed necessary by the patient's primary care practitioner  Disposition: Remain inpatient  DVT Prophylaxis: Prophylactic Lovenox   Code Status: Full code   Family Communication Timothy-son over the phone yesterday  Procedures:  None  CONSULTS:  None   MEDICATIONS: Scheduled Meds: . albuterol  2.5 mg Nebulization Q6H  . atorvastatin  40 mg Oral q1800  . enoxaparin (LOVENOX) injection  40 mg Subcutaneous Q24H  . insulin aspart  0-15 Units Subcutaneous TID WC  . ipratropium  0.5 mg Nebulization Q6H  . levETIRAcetam  500 mg Oral Q12H  . levofloxacin (LEVAQUIN) IV  750 mg Intravenous Q24H  . linagliptin  5 mg Oral Daily  . lisinopril  10 mg Oral Daily  . metoprolol succinate  50 mg Oral Daily  . multivitamin with minerals  1 tablet Oral Daily  . nitroGLYCERIN  0.4 mg Transdermal Daily  . pantoprazole  40 mg Oral Daily  . pioglitazone  30 mg Oral Q breakfast  . [START ON 08/08/2013] predniSONE  40 mg Oral Q breakfast  . sodium chloride  3 mL Intravenous Q12H  . tiotropium  18 mcg Inhalation Daily   Continuous Infusions:  PRN Meds:.sodium chloride, acetaminophen, acetaminophen, albuterol, alum & mag hydroxide-simeth, guaiFENesin-dextromethorphan, HYDROcodone-acetaminophen, morphine injection, ondansetron (ZOFRAN) IV, ondansetron, sodium chloride  Antibiotics: Anti-infectives   Start     Dose/Rate Route Frequency Ordered Stop  08/06/13 2000  levofloxacin (LEVAQUIN) IVPB 750 mg     750 mg 100 mL/hr over 90 Minutes Intravenous Every 24 hours 08/06/13 1842         PHYSICAL EXAM: Vital signs in last 24 hours: Filed Vitals:   08/07/13 0339 08/07/13 0342 08/07/13 0500 08/07/13  0544  BP: 186/112 176/108  161/88  Pulse: 107     Temp: 98.5 F (36.9 C)     TempSrc: Oral     Resp: 18     Height:      Weight:   89.903 kg (198 lb 3.2 oz)   SpO2: 96%       Weight change:  Filed Weights   08/06/13 1118 08/06/13 1842 08/07/13 0500  Weight: 92.534 kg (204 lb) 91.853 kg (202 lb 8 oz) 89.903 kg (198 lb 3.2 oz)   Body mass index is 32.01 kg/(m^2).   Gen Exam: Awake and alert with clear speech.   Neck: Supple, No JVD.   Chest: B/L Clear.  Only scattered rhonchi CVS: S1 S2 Regular, no murmurs.  Abdomen: soft, BS +, non tender, non distended.  Extremities: no edema, lower extremities warm to touch. Neurologic: Non Focal.   Skin: No Rash.   Wounds: N/A.   Intake/Output from previous day:  Intake/Output Summary (Last 24 hours) at 08/07/13 1243 Last data filed at 08/07/13 0653  Gross per 24 hour  Intake    243 ml  Output   1750 ml  Net  -1507 ml     LAB RESULTS: CBC  Recent Labs Lab 08/06/13 1128 08/07/13 0525  WBC 5.8 6.9  HGB 11.0* 12.2*  HCT 32.9* 37.9*  PLT 151 156  MCV 84.4 84.0  MCH 28.2 27.1  MCHC 33.4 32.2  RDW 14.8 14.7    Chemistries   Recent Labs Lab 08/06/13 1128 08/07/13 0525  NA 136 137  K 3.1* 4.1  CL 100 100  CO2 23 25  GLUCOSE 491* 297*  BUN 8 11  CREATININE 1.05 0.98  CALCIUM 9.3 9.8    CBG:  Recent Labs Lab 08/06/13 1718 08/06/13 2129 08/07/13 0750 08/07/13 1157  GLUCAP 230* 238* 260* 514*    GFR Estimated Creatinine Clearance: 63.1 ml/min (by C-G formula based on Cr of 0.98).  Coagulation profile No results found for this basename: INR, PROTIME,  in the last 168 hours  Cardiac Enzymes No results found for this basename: CK, CKMB, TROPONINI, MYOGLOBIN,  in the last 168 hours  No components found with this basename: POCBNP,   Recent Labs  08/06/13 1337  DDIMER 0.81*    Recent Labs  08/07/13 0525  HGBA1C 11.4*   No results found for this basename: CHOL, HDL, LDLCALC, TRIG, CHOLHDL,  LDLDIRECT,  in the last 72 hours No results found for this basename: TSH, T4TOTAL, FREET3, T3FREE, THYROIDAB,  in the last 72 hours No results found for this basename: VITAMINB12, FOLATE, FERRITIN, TIBC, IRON, RETICCTPCT,  in the last 72 hours No results found for this basename: LIPASE, AMYLASE,  in the last 72 hours  Urine Studies No results found for this basename: UACOL, UAPR, USPG, UPH, UTP, UGL, UKET, UBIL, UHGB, UNIT, UROB, ULEU, UEPI, UWBC, URBC, UBAC, CAST, CRYS, UCOM, BILUA,  in the last 72 hours  MICROBIOLOGY: No results found for this or any previous visit (from the past 240 hour(s)).  RADIOLOGY STUDIES/RESULTS: Dg Chest 2 View (if Patient Has Fever And/or Copd)  08/06/2013   *RADIOLOGY REPORT*  Clinical Data: Cough and shortness of  breath.  CHEST - 2 VIEW  Comparison: 02/28/2012 chest radiograph  Findings: The cardiomediastinal silhouette is unremarkable. COPD/emphysema again noted. There is no evidence of focal airspace disease, pulmonary edema, suspicious pulmonary nodule/mass, pleural effusion, or pneumothorax. No acute bony abnormalities are identified.  IMPRESSION: No evidence of active cardiopulmonary disease.   Original Report Authenticated By: Harmon Pier, M.D.   Ct Angio Chest Pe W/cm &/or Wo Cm  08/06/2013   **ADDENDUM** CREATED: 08/06/2013 17:14:22  Not mentioned in the initial report, there is a 3 cm right renal cyst in the upper pole.  There are also mildly enlarged celiac axis lymph nodes, the largest measuring 9 mm in short axis.  There is also and aortocaval lymph node on image number 53 series 5, measuring 11 mm in short axis.  These findings are consistent with mild nonspecific abdominal adenopathy.  There is also a small pericardial effusion measuring up to 9 mm along the right heart border, measuring only about 3 mm elsewhere however.  **END ADDENDUM** SIGNED BY: Esperanza Heir, M.D.  08/06/2013   *RADIOLOGY REPORT*  Clinical Data: chest pain, shortness  of breaath,  productive cough  CT ANGIOGRAPHY CHEST  Technique:  Multidetector CT imaging of the chest using the standard protocol during bolus administration of intravenous contrast. Multiplanar reconstructed images including MIPs were obtained and reviewed to evaluate the vascular anatomy.  Contrast: OMNIPAQUE IOHEXOL 350 MG/ML SOLN  Comparison: None.  Findings: There are no filling defects in the pulmonary arterial system.  The thoracic aorta shows no evidence of dissection or dilatation.  There is calcification of the aorta and of coronary arteries.  There is no consolidation or effusion.  There is mild bilateral perihilar bronchiectasis.  There is mild diffuse centrilobular emphysematous change.  There is a small partially calcified 11 mm subcarinal lymph node.  In the right hilum, there is abnormal soft tissue with calcifications within it measuring 11 x 28 mm.  This is also associated with more subtle branching soft tissue abnormality that follows in the right lower lobe bronchus.  IMPRESSION: No pulmonary embolism.  Abnormal right hilar soft tissue likely reflects chronic adenopathy given the presence of calcification. This could indicate prior granulomatous exposure.  There is mild COPD.   Original Report Authenticated By: Esperanza Heir, M.D.    Jeoffrey Massed, MD  [facility] Pager:[phone]  If 7PM-7AM, please contact night-coverage [email or web address] Password [email or web address] 08/07/2013, 12:43 PM   LOS: 1 day

## 2013-08-07 NOTE — Progress Notes (Signed)
Patient's CBG 514 Dr. Informed and orders received.

## 2013-08-07 NOTE — Discharge Summary (Signed)
PATIENT DETAILS Name: Eddie Graves Age: 79 y.o. Sex: male Date of Birth: 04-12-1931 MRN: [id number]. Admit Date: 08/06/2013 Admitting Physician: Maretta Bees, MD YNW:GNFA, [facility], DO  Recommendations for Outpatient Follow-up:  1. Please refer to pulmonology for formal pulmonary function tests 2. Please repeat a CT of the chest and CT of the abdomen to assess the mild mediastinal lymphadenopathy seen on CT of the chest, 3 cm right renal cyst seen on the CT of the chest as well. Please monitor this periodically. 3. Suspect, some confusion regarding medication use, patient is blind, all his medications as given by his son. Apparently patient was not on Actos, when he should be continued on this. Please reconcile all medications at next visit.   PRIMARY DISCHARGE DIAGNOSIS:  Principal Problem:   COPD exacerbation Active Problems:   Essential hypertension, benign   Spinal stenosis, lumbar region, with neurogenic claudication   Type II diabetes mellitus with neurological manifestations, uncontrolled   Coronary atherosclerosis of native coronary artery   SAH (subarachnoid hemorrhage)      PAST MEDICAL HISTORY: Past Medical History  Diagnosis Date  . DM (diabetes mellitus) type II controlled peripheral vascular disorder   . Essential hypertension, benign   . Blind   . Headache(784.0)   . Sebaceous cyst   . Chronic airway obstruction, not elsewhere classified   . Cough   . Spinal stenosis, lumbar region, with neurogenic claudication   . Coronary atherosclerosis of native coronary artery   . Vitamin D deficiency   . Rash and other nonspecific skin eruption   . Other and unspecified hyperlipidemia   . Anxiety state, unspecified   . Depressive disorder, not elsewhere classified   . Legal blindness, as defined in [place]   . Reflux esophagitis   . Unspecified constipation   . Pain in joint, site unspecified   . Memory loss   . Palpitations   . Type II diabetes mellitus with  neurological manifestations, uncontrolled   . H/O hiatal hernia     DISCHARGE MEDICATIONS:   Medication List    STOP taking these medications       cilostazol 100 MG tablet  Commonly known as:  PLETAL      TAKE these medications       acetaminophen 500 MG tablet  Commonly known as:  TYLENOL  Take 500 mg by mouth every 8 (eight) hours as needed for pain.     albuterol 108 (90 BASE) MCG/ACT inhaler  Commonly known as:  PROVENTIL HFA;VENTOLIN HFA  Inhale 2 puffs into the lungs every 2 (two) hours as needed for wheezing or shortness of breath.     albuterol 108 (90 BASE) MCG/ACT inhaler  Commonly known as:  PROVENTIL HFA;VENTOLIN HFA  Inhale 2 puffs into the lungs every 6 (six) hours as needed for wheezing.     atorvastatin 40 MG tablet  Commonly known as:  LIPITOR  Take 40 mg by mouth every morning.     esomeprazole 40 MG capsule  Commonly known as:  NEXIUM  Take 40 mg by mouth daily before breakfast.     guaiFENesin-dextromethorphan 100-10 MG/5ML syrup  Commonly known as:  ROBITUSSIN DM  Take 5 mLs by mouth every 4 (four) hours as needed for cough.     HYDROcodone-acetaminophen 10-325 MG per tablet  Commonly known as:  NORCO  Take 1 tablet by mouth every 6 (six) hours as needed for pain.     hydrocortisone cream-nystatin cream-zinc oxide  Apply 1 application topically 2 (  two) times daily.     levETIRAcetam 500 MG tablet  Commonly known as:  KEPPRA  Take 1 tablet (500 mg total) by mouth every 12 (twelve) hours.     linagliptin 5 MG Tabs tablet  Commonly known as:  TRADJENTA  Take 5 mg by mouth daily.     lisinopril 10 MG tablet  Commonly known as:  PRINIVIL,ZESTRIL  Take 10 mg by mouth daily.     metFORMIN 500 MG tablet  Commonly known as:  GLUCOPHAGE  Take 1 tablet (500 mg total) by mouth 2 (two) times daily with a meal.     metoprolol succinate 50 MG 24 hr tablet  Commonly known as:  TOPROL-XL  Take 50 mg by mouth daily.     multivitamin with minerals  Tabs tablet  Take 1 tablet by mouth daily.     nitroGLYCERIN 0.4 mg/hr patch  Commonly known as:  NITRODUR - Dosed in mg/24 hr  Place 1 patch onto the skin daily.     pioglitazone 15 MG tablet  Commonly known as:  ACTOS  Take 2 tablets (30 mg total) by mouth daily.     Prednicarbate 0.1 % Crea  Take 1 application by mouth daily.     predniSONE 10 MG tablet  Commonly known as:  DELTASONE  - Take 40 mg (4 tablets) orally for 2 days, then,  - Take 30 mg (3 tablets) orally for 2 days, then,  - Take 20 mg (2 tablets) orally for 2 days, then,  - Take 10 mg (1 tablet) orally for one day and then stop.     tiotropium 18 MCG inhalation capsule  Commonly known as:  SPIRIVA HANDIHALER  Place 1 capsule (18 mcg total) into inhaler and inhale daily.     Vitamin D 2000 UNITS Caps  Take 1 capsule by mouth daily.        ALLERGIES:  No Known Allergies  BRIEF HPI:  See H&P, Labs, Consult and Test reports for all details in brief, patient was admitted for shortness of breath and cough for the past 2 weeks prior to admission. He was found to be wheezing, and thought to have COPD exacerbation. He was admitted for further evaluation and treatment.  CONSULTATIONS:   None  PERTINENT RADIOLOGIC STUDIES: Dg Chest 2 View (if Patient Has Fever And/or Copd)  08/06/2013   *RADIOLOGY REPORT*  Clinical Data: Cough and shortness of breath.  CHEST - 2 VIEW  Comparison: 02/28/2012 chest radiograph  Findings: The cardiomediastinal silhouette is unremarkable. COPD/emphysema again noted. There is no evidence of focal airspace disease, pulmonary edema, suspicious pulmonary nodule/mass, pleural effusion, or pneumothorax. No acute bony abnormalities are identified.  IMPRESSION: No evidence of active cardiopulmonary disease.   Original Report Authenticated By: Harmon Pier, M.D.   Ct Angio Chest Pe W/cm &/or Wo Cm  08/06/2013   **ADDENDUM** CREATED: 08/06/2013 17:14:22  Not mentioned in the initial report, there  is a 3 cm right renal cyst in the upper pole.  There are also mildly enlarged celiac axis lymph nodes, the largest measuring 9 mm in short axis.  There is also and aortocaval lymph node on image number 53 series 5, measuring 11 mm in short axis.  These findings are consistent with mild nonspecific abdominal adenopathy.  There is also a small pericardial effusion measuring up to 9 mm along the right heart border, measuring only about 3 mm elsewhere however.  **END ADDENDUM** SIGNED BY: Esperanza Heir, M.D.  08/06/2013   *  RADIOLOGY REPORT*  Clinical Data: chest pain, shortness  of breaath, productive cough  CT ANGIOGRAPHY CHEST  Technique:  Multidetector CT imaging of the chest using the standard protocol during bolus administration of intravenous contrast. Multiplanar reconstructed images including MIPs were obtained and reviewed to evaluate the vascular anatomy.  Contrast: OMNIPAQUE IOHEXOL 350 MG/ML SOLN  Comparison: None.  Findings: There are no filling defects in the pulmonary arterial system.  The thoracic aorta shows no evidence of dissection or dilatation.  There is calcification of the aorta and of coronary arteries.  There is no consolidation or effusion.  There is mild bilateral perihilar bronchiectasis.  There is mild diffuse centrilobular emphysematous change.  There is a small partially calcified 11 mm subcarinal lymph node.  In the right hilum, there is abnormal soft tissue with calcifications within it measuring 11 x 28 mm.  This is also associated with more subtle branching soft tissue abnormality that follows in the right lower lobe bronchus.  IMPRESSION: No pulmonary embolism.  Abnormal right hilar soft tissue likely reflects chronic adenopathy given the presence of calcification. This could indicate prior granulomatous exposure.  There is mild COPD.   Original Report Authenticated By: Esperanza Heir, M.D.     PERTINENT LAB RESULTS: CBC:  Recent Labs  08/06/13 1128 08/07/13 0525   WBC 5.8 6.9  HGB 11.0* 12.2*  HCT 32.9* 37.9*  PLT 151 156   CMET CMP     Component Value Date/Time   NA 137 08/07/2013 0525   NA 137 06/08/2013 1306   K 4.1 08/07/2013 0525   CL 100 08/07/2013 0525   CO2 25 08/07/2013 0525   GLUCOSE 425* 08/07/2013 1217   GLUCOSE 271* 06/08/2013 1306   BUN 11 08/07/2013 0525   BUN 16 06/08/2013 1306   CREATININE 0.98 08/07/2013 0525   CALCIUM 9.8 08/07/2013 0525   PROT 7.7 08/07/2013 0525   PROT 7.4 03/16/2013 1006   ALBUMIN 3.6 08/07/2013 0525   AST 47* 08/07/2013 0525   ALT 22 08/07/2013 0525   ALKPHOS 108 08/07/2013 0525   BILITOT 0.6 08/07/2013 0525   GFRNONAA 76* 08/07/2013 0525   GFRAA 88* 08/07/2013 0525    GFR Estimated Creatinine Clearance: 63.3 ml/min (by C-G formula based on Cr of 0.98). No results found for this basename: LIPASE, AMYLASE,  in the last 72 hours No results found for this basename: CKTOTAL, CKMB, CKMBINDEX, TROPONINI,  in the last 72 hours No components found with this basename: POCBNP,   Recent Labs  08/06/13 1337  DDIMER 0.81*    Recent Labs  08/07/13 0525  HGBA1C 11.4*   No results found for this basename: CHOL, HDL, LDLCALC, TRIG, CHOLHDL, LDLDIRECT,  in the last 72 hours No results found for this basename: TSH, T4TOTAL, FREET3, T3FREE, THYROIDAB,  in the last 72 hours No results found for this basename: VITAMINB12, FOLATE, FERRITIN, TIBC, IRON, RETICCTPCT,  in the last 72 hours Coags: No results found for this basename: PT, INR,  in the last 72 hours Microbiology: No results found for this or any previous visit (from the past 240 hour(s)).   BRIEF HOSPITAL COURSE:  COPD exacerbation  - Patient presented with shortness of breath, and was wheezing on exam, a CTA of the chest was negative for pulmonary embolism. Patient was admit to a medical surgical unit, and started on Solu-Medrol, nebulized bronchodilators and intravenous Levaquin. - With these measures, patient quickly made clinical improvement, this morning during my rounds  he felt significantly better and  was anxious to be discharged. His lungs were clear on exam with air entry. - Continue to encourage tobacco cessation (he has not smoked for 2 months)  -Suspect needs a formal pulmonary function test upon discharge, he can be referred to the pulmonology clinic by his PCP   . Essential hypertension, benign  - Continue lisinopril, metoprolol and transdermal nitroglycerin   . history of SAH (subarachnoid hemorrhage)  - Currently stable, without headaches, moving all 4 extremities  - Per family, he is not taking effient anymore, since his recent subarachnoid hemorrhage  . Type II diabetes mellitus with neurological manifestations, uncontrolled  - Spoke to his son [name] on the phone, who helps him with his medications, he claims that the patient is not taking Actos  - I suspect, uncontrolled hyperglycemia and may be from noncompliance to medications secondary to the patient being blind and he may not be getting the right medications.A1C has increased to 11.4.  - Will continue with Actos and tradjenta  on discharge, will also add the metformin on discharge.Further optimization of this regimen can be done by his primary care practitioner.Unfortunately patient lives alone and is blind, not a ideal scenario to start Insulin  . Spinal stenosis, lumbar region, with neurogenic claudication  - As needed narcotics  . Coronary atherosclerosis of native coronary artery  - Spoke with son [name], patient is not on Effient  - Currently chest pain-free, shortness of breath likely secondary to COPD exacerbation as patient wheezing   . History of seizures  - Continue with Keppra   . Mild mediastinal lymphadenopathy  - Suspect this is likely reactive, further workup if needed can be done in the outpatient setting   . Small pericardial effusion  - This is seen on CT scan,  2-D echocardiogram done this admission, however did not show any effusion.  . 3 cm right renal cyst   - She seen in the CT scan of the chest, we'll defer further workup to the outpatient setting, if deemed necessary by the patient's primary care practitioner  TODAY-DAY OF DISCHARGE:  Subjective:   Emeka Lindner today has no headache,no chest abdominal pain,no new weakness tingling or numbness, feels much better wants to go home today.   Objective:   Blood pressure 135/82, pulse 85, temperature 98.3 F (36.8 C), temperature source Oral, resp. rate 20, height 5\' 6"  (1.676 m), weight 90.22 kg (198 lb 14.4 oz), SpO2 96.00%.  Intake/Output Summary (Last 24 hours) at 08/08/13 1055 Last data filed at 08/08/13 0307  Gross per 24 hour  Intake    480 ml  Output    375 ml  Net    105 ml   Filed Weights   08/06/13 1842 08/07/13 0500 08/08/13 0540  Weight: 91.853 kg (202 lb 8 oz) 89.903 kg (198 lb 3.2 oz) 90.22 kg (198 lb 14.4 oz)    Exam Awake Alert, Oriented *3, No new F.N deficits, Normal affect [place].AT,PERRAL Supple Neck,No JVD, No cervical lymphadenopathy appriciated.  Symmetrical Chest wall movement, Good air movement bilaterally, CTAB RRR,No Gallops,Rubs or new Murmurs, No Parasternal Heave +ve B.Sounds, Abd Soft, Non tender, No organomegaly appriciated, No rebound -guarding or rigidity. No Cyanosis, Clubbing or edema, No new Rash or bruise  DISCHARGE CONDITION: Stable  DISPOSITION: SNF ALF  Home Home with home health services/[facility]  DISCHARGE INSTRUCTIONS:    Activity:  As tolerated with Full fall precautions use walker/cane & assistance as needed  Diet recommendation: Diabetic Diet Heart Healthy diet  Discharge  Orders   Future Appointments Provider Department Dept Phone   08/28/2013 10:30 AM Carita Pian [facility] [phone]   09/04/2013 11:00 AM Edison Pace, RPH-CPP [facility] [phone]   Future Orders Complete By Expires   Call MD for:  difficulty breathing, headache or visual disturbances  As directed    Diet  - low sodium heart healthy  As directed    Diet - low sodium heart healthy  As directed    Diet Carb Modified  As directed    Diet Carb Modified  As directed    Increase activity slowly  As directed    Increase activity slowly  As directed       Follow-up Information   Follow up with REED, TIFFANY, DO. Schedule an appointment as soon as possible for a visit in 1 week.   Specialty:  Geriatric Medicine   Contact information:   [address] [place] [address] [phone]       Follow up with Robynn Pane, MD. Schedule an appointment as soon as possible for a visit in 1 week.   Specialty:  Cardiology   Contact information:   [address] [place] [address] [phone]       Total Time spent on discharge equals 45 minutes.  SignedJeoffrey Massed 08/08/2013 10:55 AM

## 2013-08-07 NOTE — Progress Notes (Signed)
*  PRELIMINARY RESULTS* Echocardiogram 2D Echocardiogram has been performed.  Eddie Graves 08/07/2013, 9:38 AM

## 2013-08-08 LAB — GLUCOSE, CAPILLARY: Glucose-Capillary: 276 mg/dL — ABNORMAL HIGH (ref 70–99)

## 2013-08-08 MED ORDER — METFORMIN HCL 500 MG PO TABS: 500.0000 mg | ORAL_TABLET | Freq: Two times a day (BID) | ORAL | Status: DC

## 2013-08-08 NOTE — Progress Notes (Signed)
Nsg Discharge Note  Admit Date:  08/06/2013 Discharge date: 08/08/2013   Eddie Graves to be D/C'd Home per MD order.  AVS completed.  Copy for chart, and copy for patient signed, and dated. Patient/caregiver able to verbalize understanding.  Discharge Medication:   Medication List    STOP taking these medications       cilostazol 100 MG tablet  Commonly known as:  PLETAL      TAKE these medications       acetaminophen 500 MG tablet  Commonly known as:  TYLENOL  Take 500 mg by mouth every 8 (eight) hours as needed for pain.     albuterol 108 (90 BASE) MCG/ACT inhaler  Commonly known as:  PROVENTIL HFA;VENTOLIN HFA  Inhale 2 puffs into the lungs every 2 (two) hours as needed for wheezing or shortness of breath.     albuterol 108 (90 BASE) MCG/ACT inhaler  Commonly known as:  PROVENTIL HFA;VENTOLIN HFA  Inhale 2 puffs into the lungs every 6 (six) hours as needed for wheezing.     atorvastatin 40 MG tablet  Commonly known as:  LIPITOR  Take 40 mg by mouth every morning.     esomeprazole 40 MG capsule  Commonly known as:  NEXIUM  Take 40 mg by mouth daily before breakfast.     guaiFENesin-dextromethorphan 100-10 MG/5ML syrup  Commonly known as:  ROBITUSSIN DM  Take 5 mLs by mouth every 4 (four) hours as needed for cough.     HYDROcodone-acetaminophen 10-325 MG per tablet  Commonly known as:  NORCO  Take 1 tablet by mouth every 6 (six) hours as needed for pain.     hydrocortisone cream-nystatin cream-zinc oxide  Apply 1 application topically 2 (two) times daily.     levETIRAcetam 500 MG tablet  Commonly known as:  KEPPRA  Take 1 tablet (500 mg total) by mouth every 12 (twelve) hours.     linagliptin 5 MG Tabs tablet  Commonly known as:  TRADJENTA  Take 5 mg by mouth daily.     lisinopril 10 MG tablet  Commonly known as:  PRINIVIL,ZESTRIL  Take 10 mg by mouth daily.     metFORMIN 500 MG tablet  Commonly known as:  GLUCOPHAGE  Take 1 tablet (500 mg total) by mouth  2 (two) times daily with a meal.     metoprolol succinate 50 MG 24 hr tablet  Commonly known as:  TOPROL-XL  Take 50 mg by mouth daily.     multivitamin with minerals Tabs tablet  Take 1 tablet by mouth daily.     nitroGLYCERIN 0.4 mg/hr patch  Commonly known as:  NITRODUR - Dosed in mg/24 hr  Place 1 patch onto the skin daily.     pioglitazone 15 MG tablet  Commonly known as:  ACTOS  Take 2 tablets (30 mg total) by mouth daily.     Prednicarbate 0.1 % Crea  Take 1 application by mouth daily.     predniSONE 10 MG tablet  Commonly known as:  DELTASONE  - Take 40 mg (4 tablets) orally for 2 days, then,  - Take 30 mg (3 tablets) orally for 2 days, then,  - Take 20 mg (2 tablets) orally for 2 days, then,  - Take 10 mg (1 tablet) orally for one day and then stop.     tiotropium 18 MCG inhalation capsule  Commonly known as:  SPIRIVA HANDIHALER  Place 1 capsule (18 mcg total) into inhaler and inhale daily.  Vitamin D 2000 UNITS Caps  Take 1 capsule by mouth daily.        Discharge Assessment: Filed Vitals:   08/08/13 0540  BP: 135/82  Pulse: 85  Temp: 98.3 F (36.8 C)  Resp: 20   Skin clean, dry and intact without evidence of skin break down, no evidence of skin tears noted. IV catheter discontinued intact. Site without signs and symptoms of complications - no redness or edema noted at insertion site, patient denies c/o pain - only slight tenderness at site.  Dressing with slight pressure applied.  D/c Instructions-Education: Discharge instructions given to patient/family with verbalized understanding. D/c education completed with patient/family including follow up instructions, medication list, d/c activities limitations if indicated, with other d/c instructions as indicated by MD - patient able to verbalize understanding, all questions fully answered. Patient instructed to return to ED, call 911, or call MD for any changes in condition.  Patient escorted via WC,  and D/C home via private auto.  Eddie Graves Consuella Lose, RN 08/08/2013 11:13 AM

## 2013-08-08 NOTE — Care Management Note (Signed)
    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     Action/Plan:   Anticipated DC Date:  08/08/2013   Anticipated DC Plan:  HOME/SELF CARE      DC Planning Services  CM consult      Choice offered to / List presented to:             Status of service:  Completed, signed off Medicare Important Message given?   (If response is "NO", the following Medicare IM given date fields will be blank) Date Medicare IM given:   Date Additional Medicare IM given:    Discharge Disposition:  HOME/SELF CARE  Per UR Regulation:  Reviewed for med. 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  Patient uses a cane at home. Patient has medication coverage and transportation at dc .

## 2013-08-11 ENCOUNTER — Emergency Department (HOSPITAL_COMMUNITY)
Admission: EM | Admit: 2013-08-11 | Discharge: 2013-08-11 | Disposition: A | Discharge status: Home / Self Care | Payer: Medicare Other | Attending: Emergency Medicine | Admitting: Emergency Medicine

## 2013-08-11 ENCOUNTER — Encounter (HOSPITAL_COMMUNITY): Payer: Self-pay

## 2013-08-11 DIAGNOSIS — Z79899 Other long term (current) drug therapy: Secondary | ICD-10-CM | POA: Insufficient documentation

## 2013-08-11 DIAGNOSIS — E1149 Type 2 diabetes mellitus with other diabetic neurological complication: Secondary | ICD-10-CM | POA: Insufficient documentation

## 2013-08-11 DIAGNOSIS — J449 Chronic obstructive pulmonary disease, unspecified: Secondary | ICD-10-CM | POA: Insufficient documentation

## 2013-08-11 DIAGNOSIS — H548 Legal blindness, as defined in USA: Secondary | ICD-10-CM | POA: Insufficient documentation

## 2013-08-11 DIAGNOSIS — E1142 Type 2 diabetes mellitus with diabetic polyneuropathy: Secondary | ICD-10-CM | POA: Insufficient documentation

## 2013-08-11 DIAGNOSIS — R739 Hyperglycemia, unspecified: Secondary | ICD-10-CM

## 2013-08-11 DIAGNOSIS — F3289 Other specified depressive episodes: Secondary | ICD-10-CM | POA: Insufficient documentation

## 2013-08-11 DIAGNOSIS — I2510 Atherosclerotic heart disease of native coronary artery without angina pectoris: Secondary | ICD-10-CM | POA: Insufficient documentation

## 2013-08-11 DIAGNOSIS — E1159 Type 2 diabetes mellitus with other circulatory complications: Secondary | ICD-10-CM | POA: Insufficient documentation

## 2013-08-11 DIAGNOSIS — E559 Vitamin D deficiency, unspecified: Secondary | ICD-10-CM | POA: Insufficient documentation

## 2013-08-11 DIAGNOSIS — J4489 Other specified chronic obstructive pulmonary disease: Secondary | ICD-10-CM | POA: Insufficient documentation

## 2013-08-11 DIAGNOSIS — I798 Other disorders of arteries, arterioles and capillaries in diseases classified elsewhere: Secondary | ICD-10-CM | POA: Insufficient documentation

## 2013-08-11 DIAGNOSIS — Z8719 Personal history of other diseases of the digestive system: Secondary | ICD-10-CM | POA: Insufficient documentation

## 2013-08-11 DIAGNOSIS — IMO0002 Reserved for concepts with insufficient information to code with codable children: Secondary | ICD-10-CM | POA: Insufficient documentation

## 2013-08-11 DIAGNOSIS — E785 Hyperlipidemia, unspecified: Secondary | ICD-10-CM | POA: Insufficient documentation

## 2013-08-11 DIAGNOSIS — Z9861 Coronary angioplasty status: Secondary | ICD-10-CM | POA: Insufficient documentation

## 2013-08-11 DIAGNOSIS — I10 Essential (primary) hypertension: Secondary | ICD-10-CM | POA: Insufficient documentation

## 2013-08-11 DIAGNOSIS — Z8739 Personal history of other diseases of the musculoskeletal system and connective tissue: Secondary | ICD-10-CM | POA: Insufficient documentation

## 2013-08-11 DIAGNOSIS — Z87891 Personal history of nicotine dependence: Secondary | ICD-10-CM | POA: Insufficient documentation

## 2013-08-11 DIAGNOSIS — F329 Major depressive disorder, single episode, unspecified: Secondary | ICD-10-CM | POA: Insufficient documentation

## 2013-08-11 LAB — CBC WITH DIFFERENTIAL/PLATELET
Basophils Absolute: 0 10*3/uL (ref 0.0–0.1)
HCT: 35.8 % — ABNORMAL LOW (ref 39.0–52.0)
Lymphocytes Relative: 18 % (ref 12–46)
Neutro Abs: 5.8 10*3/uL (ref 1.7–7.7)
Platelets: 203 10*3/uL (ref 150–400)
RBC: 4.28 MIL/uL (ref 4.22–5.81)
RDW: 14.4 % (ref 11.5–15.5)
WBC: 8.4 10*3/uL (ref 4.0–10.5)

## 2013-08-11 LAB — BASIC METABOLIC PANEL
CO2: 24 mEq/L (ref 19–32)
Chloride: 97 mEq/L (ref 96–112)
Sodium: 134 mEq/L — ABNORMAL LOW (ref 135–145)

## 2013-08-11 LAB — GLUCOSE, CAPILLARY: Glucose-Capillary: 382 mg/dL — ABNORMAL HIGH (ref 70–99)

## 2013-08-11 MED ORDER — INSULIN ASPART 100 UNIT/ML ~~LOC~~ SOLN
10.0000 [IU] | Freq: Once | SUBCUTANEOUS | Status: AC
  Administered 2013-08-11: 10 [IU] via SUBCUTANEOUS
  Filled 2013-08-11: qty 1

## 2013-08-11 NOTE — ED Notes (Signed)
Pt. Is here for elevated Blood sugar (is not on insulin) and also here for hypertension.  Pt.denies any sob or chest pain. Pt. Is weak,  Denies any n/v/d. Pt. Is in No acute distress.  Alert and oriented X 3

## 2013-08-11 NOTE — ED Notes (Signed)
Checked pts CBG it was 382 reported to nurse Lawson Fiscal.

## 2013-08-11 NOTE — ED Provider Notes (Signed)
CSN: [id number]     Arrival date & time 08/11/13  1845 History   First MD Initiated Contact with Patient 08/11/13 2018     Chief Complaint  Patient presents with  . Hyperglycemia   (Consider location/radiation/quality/duration/timing/severity/associated sxs/prior Treatment) Patient is a 79 y.o. male presenting with hyperglycemia. The history is provided by the patient.  Hyperglycemia He was recently admitted to the hospital for COPD exacerbation and was sent home on prednisone. He has noted that he he is urinating a little bit more often than normal and is more thirsty than normal. He denies some chest pain, abdominal pain, nausea, vomiting. He checked his blood sugar today and was over 400 so he called EMS and taken to the ED. He is totally blind but just got a blood sugar monitor that tells him Shellee Milo what his blood sugar is and he also got a blood pressure monitor that is also verbal.  Past Medical History  Diagnosis Date  . DM (diabetes mellitus) type II controlled peripheral vascular disorder   . Essential hypertension, benign   . Blind   . Headache(784.0)   . Sebaceous cyst   . Chronic airway obstruction, not elsewhere classified   . Cough   . Spinal stenosis, lumbar region, with neurogenic claudication   . Coronary atherosclerosis of native coronary artery   . Vitamin D deficiency   . Rash and other nonspecific skin eruption   . Other and unspecified hyperlipidemia   . Anxiety state, unspecified   . Depressive disorder, not elsewhere classified   . Legal blindness, as defined in [place]   . Reflux esophagitis   . Unspecified constipation   . Pain in joint, site unspecified   . Memory loss   . Palpitations   . Type II diabetes mellitus with neurological manifestations, uncontrolled   . H/O hiatal hernia    Past Surgical History  Procedure Laterality Date  . Coronary angioplasty with stent placement    . Hernia repair    . Eye surgery     Family History  Problem Relation  Age of Onset  . Asthma Mother   . Diabetes Son   . Cancer Daughter   . Diabetes Son    History  Substance Use Topics  . Smoking status: Former Smoker -- 1.00 packs/day for 50 years    Types: Cigarettes    Quit date: 03/07/2013  . Smokeless tobacco: Never Used  . Alcohol Use: No    Review of Systems  All other systems reviewed and are negative.    Allergies  Review of patient's allergies indicates no known allergies.  Home Medications   Current Outpatient Rx  Name  Route  Sig  Dispense  Refill  . acetaminophen (TYLENOL) 500 MG tablet   Oral   Take 500 mg by mouth every 8 (eight) hours as needed for pain.          Marland Kitchen albuterol (PROVENTIL HFA;VENTOLIN HFA) 108 (90 BASE) MCG/ACT inhaler   Inhalation   Inhale 2 puffs into the lungs every 2 (two) hours as needed for wheezing or shortness of breath.   18 g   0   . albuterol (PROVENTIL HFA;VENTOLIN HFA) 108 (90 BASE) MCG/ACT inhaler   Inhalation   Inhale 2 puffs into the lungs every 6 (six) hours as needed for wheezing.         Marland Kitchen atorvastatin (LIPITOR) 40 MG tablet   Oral   Take 40 mg by mouth every morning.         Marland Kitchen  Cholecalciferol (VITAMIN D) 2000 UNITS CAPS   Oral   Take 1 capsule by mouth daily.         Marland Kitchen esomeprazole (NEXIUM) 40 MG capsule   Oral   Take 40 mg by mouth daily before breakfast.         . guaiFENesin-dextromethorphan (ROBITUSSIN DM) 100-10 MG/5ML syrup   Oral   Take 5 mLs by mouth every 4 (four) hours as needed for cough.   118 mL   0   . HYDROcodone-acetaminophen (NORCO) 10-325 MG per tablet   Oral   Take 1 tablet by mouth every 6 (six) hours as needed for pain.         . hydrocortisone cream-nystatin cream-zinc oxide   Topical   Apply 1 application topically 2 (two) times daily.   1 Tube   3   . levETIRAcetam (KEPPRA) 500 MG tablet   Oral   Take 1 tablet (500 mg total) by mouth every 12 (twelve) hours.   60 tablet   0   . linagliptin (TRADJENTA) 5 MG TABS tablet    Oral   Take 5 mg by mouth daily.         Marland Kitchen lisinopril (PRINIVIL,ZESTRIL) 10 MG tablet   Oral   Take 10 mg by mouth daily.         . metFORMIN (GLUCOPHAGE) 500 MG tablet   Oral   Take 1 tablet (500 mg total) by mouth 2 (two) times daily with a meal.   60 tablet   0   . metoprolol succinate (TOPROL-XL) 50 MG 24 hr tablet   Oral   Take 50 mg by mouth daily.          . Multiple Vitamin (MULITIVITAMIN WITH MINERALS) TABS   Oral   Take 1 tablet by mouth daily.         . nitroGLYCERIN (NITRODUR - DOSED IN MG/24 HR) 0.4 mg/hr   Transdermal   Place 1 patch onto the skin daily.         . pioglitazone (ACTOS) 15 MG tablet   Oral   Take 2 tablets (30 mg total) by mouth daily.   30 tablet   0     Patient will need to see primary MD in 2 days befo ...   . Prednicarbate 0.1 % CREA   Oral   Take 1 application by mouth daily.          . predniSONE (DELTASONE) 10 MG tablet      Take 40 mg (4 tablets) orally for 2 days, then, Take 30 mg (3 tablets) orally for 2 days, then, Take 20 mg (2 tablets) orally for 2 days, then, Take 10 mg (1 tablet) orally for one day and then stop.   19 tablet   0   . tiotropium (SPIRIVA HANDIHALER) 18 MCG inhalation capsule   Inhalation   Place 1 capsule (18 mcg total) into inhaler and inhale daily.   30 capsule   0    BP 146/81  Pulse 101  Temp(Src) 98.6 F (37 C) (Oral)  Resp 19  Ht 5\' 6"  (1.676 m)  Wt 204 lb (92.534 kg)  BMI 32.94 kg/m2  SpO2 97% Physical Exam  Nursing note and vitals reviewed.  79 year old male, resting comfortably and in no acute distress. Vital signs are significant for tachycardia with heart rate of 123, and hypertension with blood pressure 171/142. Oxygen saturation is 94%, which is normal. Head is normocephalic and atraumatic. Oropharynx  is clear. Neck is nontender and supple without adenopathy or JVD. Back is nontender and there is no CVA tenderness. Lungs are clear without rales, wheezes, or  rhonchi. Chest is nontender. Heart has regular rate and rhythm without murmur. Abdomen is soft, flat, nontender without masses or hepatosplenomegaly and peristalsis is normoactive. Extremities have no cyanosis or edema, full range of motion is present. Skin is warm and dry without rash. Neurologic: Mental status is normal, cranial nerves are intact, there are no motor or sensory deficits.  ED Course  Procedures (including critical care time) Labs Review Results for orders placed during the hospital encounter of 08/11/13  GLUCOSE, CAPILLARY      Result Value Range   Glucose-Capillary 382 (*) 70 - 99 mg/dL  CBC WITH DIFFERENTIAL      Result Value Range   WBC 8.4  4.0 - 10.5 K/uL   RBC 4.28  4.22 - 5.81 MIL/uL   Hemoglobin 11.8 (*) 13.0 - 17.0 g/dL   HCT 95.6 (*) 21.3 - 08.6 %   MCV 83.6  78.0 - 100.0 fL   MCH 27.6  26.0 - 34.0 pg   MCHC 33.0  30.0 - 36.0 g/dL   RDW 57.8  46.9 - 62.9 %   Platelets 203  150 - 400 K/uL   Neutrophils Relative % 70  43 - 77 %   Neutro Abs 5.8  1.7 - 7.7 K/uL   Lymphocytes Relative 18  12 - 46 %   Lymphs Abs 1.5  0.7 - 4.0 K/uL   Monocytes Relative 10  3 - 12 %   Monocytes Absolute 0.9  0.1 - 1.0 K/uL   Eosinophils Relative 2  0 - 5 %   Eosinophils Absolute 0.2  0.0 - 0.7 K/uL   Basophils Relative 0  0 - 1 %   Basophils Absolute 0.0  0.0 - 0.1 K/uL  BASIC METABOLIC PANEL      Result Value Range   Sodium 134 (*) 135 - 145 mEq/L   Potassium 3.7  3.5 - 5.1 mEq/L   Chloride 97  96 - 112 mEq/L   CO2 24  19 - 32 mEq/L   Glucose, Bld 390 (*) 70 - 99 mg/dL   BUN 15  6 - 23 mg/dL   Creatinine, Ser 5.28  0.50 - 1.35 mg/dL   Calcium 9.5  8.4 - 41.3 mg/dL   GFR calc non Af Amer 58 (*) >90 mL/min   GFR calc Af Amer 67 (*) >90 mL/min   MDM   1. Hyperglycemia    Hypoglycemia which is probably related to prednisone. Old records are reviewed and he is on a tapering dose of prednisone currently taking 20 mg a day. He had been admitted for COPD  exacerbation. Blood sugar is only moderately elevated in the ED and there is no evidence of ketoacidosis. His given a dose of insulin in the ED. Heart rate has come down to 101. He needs to follow up with his PCP to decide if he needs additional medication to manage his diabetes.    Dione Booze, MD 08/11/13 2138

## 2013-08-11 NOTE — ED Notes (Signed)
Pt back to room via w/c with RN, pt is blind, has cane and sunglasses, c/o "weak and hungry, some sob", (denies: pain, HA, fever, nvd, dizziness or other sx), pt alert, NAD, calm, interactive, resps e/u, speaking in clear complete sentences, skin W&D. ST on monitor with some occaisional PVCs, HR 101.

## 2013-08-16 ENCOUNTER — Ambulatory Visit
Admission: RE | Admit: 2013-08-16 | Discharge: 2013-08-16 | Disposition: A | Discharge status: Home / Self Care | Payer: Medicare Other | Source: Ambulatory Visit | Attending: Orthopedic Surgery | Admitting: Orthopedic Surgery

## 2013-08-16 DIAGNOSIS — M542 Cervicalgia: Secondary | ICD-10-CM

## 2013-08-18 ENCOUNTER — Ambulatory Visit (INDEPENDENT_AMBULATORY_CARE_PROVIDER_SITE_OTHER): Payer: Medicare Other | Admitting: Internal Medicine

## 2013-08-18 ENCOUNTER — Encounter: Payer: Self-pay | Admitting: Internal Medicine

## 2013-08-18 VITALS — BP 144/86 | HR 92 | Temp 98.2°F | Wt 203.0 lb

## 2013-08-18 DIAGNOSIS — E1149 Type 2 diabetes mellitus with other diabetic neurological complication: Secondary | ICD-10-CM

## 2013-08-18 DIAGNOSIS — E669 Obesity, unspecified: Secondary | ICD-10-CM | POA: Insufficient documentation

## 2013-08-18 DIAGNOSIS — IMO0002 Reserved for concepts with insufficient information to code with codable children: Secondary | ICD-10-CM

## 2013-08-18 DIAGNOSIS — Z23 Encounter for immunization: Secondary | ICD-10-CM

## 2013-08-18 DIAGNOSIS — J449 Chronic obstructive pulmonary disease, unspecified: Secondary | ICD-10-CM

## 2013-08-18 DIAGNOSIS — IMO0001 Reserved for inherently not codable concepts without codable children: Secondary | ICD-10-CM

## 2013-08-18 DIAGNOSIS — E1142 Type 2 diabetes mellitus with diabetic polyneuropathy: Secondary | ICD-10-CM

## 2013-08-18 DIAGNOSIS — E1165 Type 2 diabetes mellitus with hyperglycemia: Secondary | ICD-10-CM

## 2013-08-18 DIAGNOSIS — R413 Other amnesia: Secondary | ICD-10-CM

## 2013-08-18 DIAGNOSIS — J4489 Other specified chronic obstructive pulmonary disease: Secondary | ICD-10-CM

## 2013-08-18 LAB — GLUCOSE, POCT (MANUAL RESULT ENTRY): POC Glucose: 142 mg/dl — AB (ref 70–99)

## 2013-08-18 NOTE — Progress Notes (Signed)
Patient ID: Eddie Graves, male   DOB: 05-09-1931, 79 y.o.   MRN: [id number] Location:  [facility]  No Known Allergies  Chief Complaint  Patient presents with  . Hospitalization Follow-up    ER follow-up: Hyperglycemia and cough x several months   . Medication Management    discuss medications (pateint's son manages his medications- son is not here today). Several medications marked unknow    HPI: Patient is a 79 y.o. black male seen in the office today for ED visit f/u with hyperglycemia.  Medications, as always, are not known.    Cough is much better.  Has been taking steroid taper.  Discussed effects of steroid on glucose.    Has not been checking sugar but each evening when his son can come--not being used yet.  Son and grandson are going to help him with it.   CBG 142 this am, fasting.  Has not eaten since last night at 10pm.  Review of Systems:  Review of Systems  Constitutional: Negative for fever and chills.  HENT: Negative for congestion.   Eyes:       Blind  Respiratory:       Cough improved with tx of copd exacerbation  Cardiovascular: Negative for chest pain.  Gastrointestinal: Negative for abdominal pain.  Musculoskeletal: Negative for falls.  Skin: Negative for rash.  Neurological: Positive for sensory change.       Peripheral neuropathy  Endo/Heme/Allergies:       Poorly controlled DM due to nonadherence and lack of family help  Psychiatric/Behavioral: Positive for depression and memory loss.     Past Medical History  Diagnosis Date  . DM (diabetes mellitus) type II controlled peripheral vascular disorder   . Essential hypertension, benign   . Blind   . Headache(784.0)   . Sebaceous cyst   . Chronic airway obstruction, not elsewhere classified   . Cough   . Spinal stenosis, lumbar region, with neurogenic claudication   . Coronary atherosclerosis of native coronary artery   . Vitamin D deficiency   . Rash and  other nonspecific skin eruption   . Other and unspecified hyperlipidemia   . Anxiety state, unspecified   . Depressive disorder, not elsewhere classified   . Legal blindness, as defined in [place]   . Reflux esophagitis   . Unspecified constipation   . Pain in joint, site unspecified   . Memory loss   . Palpitations   . Type II diabetes mellitus with neurological manifestations, uncontrolled   . H/O hiatal hernia     Past Surgical History  Procedure Laterality Date  . Coronary angioplasty with stent placement    . Hernia repair    . Eye surgery      Social History:   reports that he quit smoking about 5 months ago. His smoking use included Cigarettes. He has a 50 pack-year smoking history. He has never used smokeless tobacco. He reports that he does not drink alcohol or use illicit drugs.  Family History  Problem Relation Age of Onset  . Asthma Mother   . Diabetes Son   . Cancer Daughter   . Diabetes Son     Medications: Patient's Medications  New Prescriptions   No medications on file  Previous Medications   ACETAMINOPHEN (TYLENOL) 500 MG TABLET    Take 500 mg by mouth every 8 (eight) hours as needed for pain.    ALBUTEROL (PROVENTIL HFA;VENTOLIN HFA) 108 (90 BASE) MCG/ACT INHALER  Inhale 2 puffs into the lungs every 2 (two) hours as needed for wheezing or shortness of breath.   ATORVASTATIN (LIPITOR) 40 MG TABLET    Take 40 mg by mouth every morning.   CHOLECALCIFEROL (VITAMIN D) 2000 UNITS CAPS    Take 1 capsule by mouth daily.   CILOSTAZOL (PLETAL) 100 MG TABLET    Take 100 mg by mouth 2 (two) times daily.   ESOMEPRAZOLE (NEXIUM) 40 MG CAPSULE    Take 40 mg by mouth daily before breakfast.   ETODOLAC (LODINE) 400 MG TABLET    Take 400 mg by mouth 2 (two) times daily.   GARLIC PO    Take 75 mg by mouth. 2 by mouth daily   HYDROCODONE-ACETAMINOPHEN (NORCO) 10-325 MG PER TABLET    Take 1 tablet by mouth every 8 (eight) hours as needed for pain.    HYDROCORTISONE  CREAM-NYSTATIN CREAM-ZINC OXIDE    Apply 1 application topically 2 (two) times daily.   LEVETIRACETAM (KEPPRA) 500 MG TABLET    Take 1 tablet (500 mg total) by mouth every 12 (twelve) hours.   LINAGLIPTIN (TRADJENTA) 5 MG TABS TABLET    Take 5 mg by mouth daily.   LISINOPRIL (PRINIVIL,ZESTRIL) 10 MG TABLET    Take 10 mg by mouth daily.   METFORMIN (GLUCOPHAGE) 500 MG TABLET    Take 1 tablet (500 mg total) by mouth 2 (two) times daily with a meal.   METOPROLOL SUCCINATE (TOPROL-XL) 50 MG 24 HR TABLET    Take 50 mg by mouth daily.    MULTIPLE VITAMIN (MULITIVITAMIN WITH MINERALS) TABS    Take 1 tablet by mouth daily.   NITROGLYCERIN (NITRODUR - DOSED IN MG/24 HR) 0.4 MG/HR    Place 1 patch onto the skin daily.   PRASUGREL (EFFIENT) 10 MG TABS TABLET    Take 10 mg by mouth daily.   PREDNICARBATE 0.1 % CREA    Take 1 application by mouth daily.    PREDNISONE (DELTASONE) 10 MG TABLET    Take 40 mg (4 tablets) orally for 2 days, then, Take 30 mg (3 tablets) orally for 2 days, then, Take 20 mg (2 tablets) orally for 2 days, then, Take 10 mg (1 tablet) orally for one day and then stop.   TIOTROPIUM (SPIRIVA HANDIHALER) 18 MCG INHALATION CAPSULE    Place 1 capsule (18 mcg total) into inhaler and inhale daily.  Modified Medications   Modified Medication Previous Medication   PIOGLITAZONE (ACTOS) 15 MG TABLET pioglitazone (ACTOS) 15 MG tablet      Take 15 mg by mouth daily.    Take 2 tablets (30 mg total) by mouth daily.  Discontinued Medications   ALBUTEROL (PROVENTIL HFA;VENTOLIN HFA) 108 (90 BASE) MCG/ACT INHALER    Inhale 2 puffs into the lungs every 6 (six) hours as needed for wheezing.   GUAIFENESIN-DEXTROMETHORPHAN (ROBITUSSIN DM) 100-10 MG/5ML SYRUP    Take 5 mLs by mouth every 4 (four) hours as needed for cough.     Physical Exam: Filed Vitals:   08/18/13 1013  BP: 144/86  Pulse: 92  Temp: 98.2 F (36.8 C)  TempSrc: Oral  Weight: 203 lb (92.08 kg)  Physical Exam  Constitutional: He  is oriented to person, place, and time. No distress.  Obese black male, nad  HENT:  Head: Normocephalic and atraumatic.  Eyes:  Wears sunglasses  Cardiovascular:  No murmur heard. irreg irreg  Pulmonary/Chest: Effort normal and breath sounds normal. No respiratory distress. He has no  wheezes.  Abdominal: Soft. Bowel sounds are normal. He exhibits no distension. There is no tenderness.  Musculoskeletal: Normal range of motion.  Neurological: He is alert and oriented to person, place, and time.  Skin: Skin is warm and dry.    Labs reviewed: Basic Metabolic Panel:  Recent Labs  11/91/47 1128 08/07/13 0525 08/07/13 1217 08/11/13 2004  NA 136 137  --  134*  K 3.1* 4.1  --  3.7  CL 100 100  --  97  CO2 23 25  --  24  GLUCOSE 491* 297* 425* 390*  BUN 8 11  --  15  CREATININE 1.05 0.98  --  1.15  CALCIUM 9.3 9.8  --  9.5   Liver Function Tests:  Recent Labs  03/16/13 1006 08/07/13 0525  AST 29 47*  ALT 14 22  ALKPHOS 88 108  BILITOT 0.4 0.6  PROT 7.4 7.7  ALBUMIN  --  3.6  CBC:  Recent Labs  03/16/13 1006  06/08/13 1306 08/06/13 1128 08/07/13 0525 08/11/13 2004  WBC 6.2  < > 6.2 5.8 6.9 8.4  NEUTROABS 3.1  --  3.4  --   --  5.8  HGB 12.8  < > 12.2* 11.0* 12.2* 11.8*  HCT 39.9  < > 39.1 32.9* 37.9* 35.8*  MCV 90  < > 90 84.4 84.0 83.6  PLT  --   < >  --  151 156 203  < > = values in this interval not displayed.  Lab Results  Component Value Date   HGBA1C 11.4* 08/07/2013    Assessment/Plan 1. Type II diabetes mellitus with neurological manifestations, uncontrolled -he now has a talking glucometer, but his son and grandson need to teach him how to use it, and they have not yet done this  -emphasized importance once again of checking glucose -he mentions that they told him in the hospital that he should be on insulin--I agree, but he can't be on insulin w/o checking his sugars--he is at high risk for a hypoglycemic event -advised to do the checks as advised  and we will review the results to we can determine what additional treatment is needed -his fasting cbg this am was satisfactory at 142 -cont actos, tradjenta, metformin at this point -f/u with me and Cathey as planned -has peripheral neuropathy and always c/o this pain--importance of diabetic control once again reiterated to improve this and prevent stroke and MI from recurring 2. COPD bronchitis -resolved, was treated with pred taper with benefit and improvement of cough, lungs clear -I will refer him for PFTs at a regular appointment--he often misses his appointments when they are arranged so I am more concerned about diabetic control at this point and want to focus on that  3. Memory loss -check MMSE next visit as planned -suspect his memory is significantly affected by poor diabetic control  Labs/tests ordered:  No additional added today Next appt: keep as scheduled with MMSE

## 2013-08-28 ENCOUNTER — Encounter: Payer: Self-pay | Admitting: Internal Medicine

## 2013-08-28 ENCOUNTER — Ambulatory Visit (INDEPENDENT_AMBULATORY_CARE_PROVIDER_SITE_OTHER): Payer: Medicare Other | Admitting: Internal Medicine

## 2013-08-28 VITALS — BP 148/92 | HR 88 | Temp 98.3°F | Ht 66.0 in | Wt 202.0 lb

## 2013-08-28 DIAGNOSIS — E1149 Type 2 diabetes mellitus with other diabetic neurological complication: Secondary | ICD-10-CM

## 2013-08-28 DIAGNOSIS — I2510 Atherosclerotic heart disease of native coronary artery without angina pectoris: Secondary | ICD-10-CM

## 2013-08-28 DIAGNOSIS — E1159 Type 2 diabetes mellitus with other circulatory complications: Secondary | ICD-10-CM

## 2013-08-28 DIAGNOSIS — E1165 Type 2 diabetes mellitus with hyperglycemia: Secondary | ICD-10-CM

## 2013-08-28 DIAGNOSIS — IMO0002 Reserved for concepts with insufficient information to code with codable children: Secondary | ICD-10-CM

## 2013-08-28 DIAGNOSIS — E1142 Type 2 diabetes mellitus with diabetic polyneuropathy: Secondary | ICD-10-CM

## 2013-08-28 LAB — GLUCOSE, POCT (MANUAL RESULT ENTRY): POC Glucose: 169 mg/dl — AB (ref 70–99)

## 2013-08-28 NOTE — Progress Notes (Signed)
Patient ID: Eddie Graves, male   DOB: 1931/04/14, 79 y.o.   MRN: [id number] Location:  [facility] [place] [facility]  Code Status: full code  No Known Allergies  Chief Complaint  Patient presents with  . Medical Managment of Chronic Issues    blood sugar, memory, blood pressure. Here with grandson Debroah Baller    HPI: Patient is a 79 y.o. AA male seen in the office today for medical management of chronic issues.  Has recently gone to the hospital for a cough and blood sugar was over 500 and they kept him there. Took Blood sugar this morning, knows it was high but can't recall the number. States that it is hard for him to get the blood in the cup for the machine (meaning test strip).States that he doesn't like bread nor pasta so he normally doesn't eat. But he does eat bread for a sandwich which he sometimes carries to work. Pt. States that he never exercises. States that he has a transportation problem getting to and from a gym.    Bought a Blood Pressure machine and has been checking it at home. States that blood pressure has been running high at home but can't recall the numbers. Son usually prepares his medications for him but he couldn't come today d/t dialysis. Pt. States that from now on he will make appt on Thursdays so son can come with him.     It was completely unclear when he got the bp machine and glucometer--said fri and wed at one point, but hasn't used hardly at all.  Did not bring either one or the recorded numbers.  Grandson seems completely disinterested in being there.    Review of Systems:  Review of Systems  Constitutional: Negative for weight loss.  HENT: Positive for hearing loss.        Plans to see if he can get hearing aids  Eyes:       Blind- no changes  Respiratory: Negative for shortness of breath.   Cardiovascular: Negative for chest pain.  Gastrointestinal: Positive for diarrhea. Negative for constipation.       Diarrhea Every once in a  while for a day or so  Neurological: Positive for tingling.       In feet- has an appt. To look at feet, unsure of date or MD  Psychiatric/Behavioral: Positive for memory loss.       States that he cant remember anything. States that it is getting worse     Past Medical History  Diagnosis Date  . DM (diabetes mellitus) type II controlled peripheral vascular disorder   . Essential hypertension, benign   . Blind   . Headache(784.0)   . Sebaceous cyst   . Chronic airway obstruction, not elsewhere classified   . Cough   . Spinal stenosis, lumbar region, with neurogenic claudication   . Coronary atherosclerosis of native coronary artery   . Vitamin D deficiency   . Rash and other nonspecific skin eruption   . Other and unspecified hyperlipidemia   . Anxiety state, unspecified   . Depressive disorder, not elsewhere classified   . Legal blindness, as defined in [place]   . Reflux esophagitis   . Unspecified constipation   . Pain in joint, site unspecified   . Memory loss   . Palpitations   . Type II diabetes mellitus with neurological manifestations, uncontrolled   . H/O hiatal hernia     Past Surgical History  Procedure Laterality Date  .  Coronary angioplasty with stent placement    . Hernia repair    . Eye surgery      Social History:   reports that he quit smoking about 5 months ago. His smoking use included Cigarettes. He has a 50 pack-year smoking history. He has never used smokeless tobacco. He reports that he does not drink alcohol or use illicit drugs.  Family History  Problem Relation Age of Onset  . Asthma Mother   . Diabetes Son   . Cancer Daughter   . Diabetes Son     Medications: Patient's Medications  New Prescriptions   No medications on file  Previous Medications   ACETAMINOPHEN (TYLENOL) 500 MG TABLET    Take 500 mg by mouth every 8 (eight) hours as needed for pain.    ALBUTEROL (PROVENTIL HFA;VENTOLIN HFA) 108 (90 BASE) MCG/ACT INHALER    Inhale 2  puffs into the lungs every 2 (two) hours as needed for wheezing or shortness of breath.   ATORVASTATIN (LIPITOR) 40 MG TABLET    Take 40 mg by mouth every morning.   CHOLECALCIFEROL (VITAMIN D) 2000 UNITS CAPS    Take 1 capsule by mouth daily.   CILOSTAZOL (PLETAL) 100 MG TABLET    Take 100 mg by mouth 2 (two) times daily.   ESOMEPRAZOLE (NEXIUM) 40 MG CAPSULE    Take 40 mg by mouth daily before breakfast.   ETODOLAC (LODINE) 400 MG TABLET    Take 400 mg by mouth 2 (two) times daily.   GARLIC PO    Take 75 mg by mouth. 2 by mouth daily   HYDROCODONE-ACETAMINOPHEN (NORCO) 10-325 MG PER TABLET    Take 1 tablet by mouth every 8 (eight) hours as needed for pain.    HYDROCORTISONE CREAM-NYSTATIN CREAM-ZINC OXIDE    Apply 1 application topically 2 (two) times daily.   LEVETIRACETAM (KEPPRA) 500 MG TABLET    Take 1 tablet (500 mg total) by mouth every 12 (twelve) hours.   LINAGLIPTIN (TRADJENTA) 5 MG TABS TABLET    Take 5 mg by mouth daily.   LISINOPRIL (PRINIVIL,ZESTRIL) 10 MG TABLET    Take 10 mg by mouth daily.   METFORMIN (GLUCOPHAGE) 500 MG TABLET    Take 1 tablet (500 mg total) by mouth 2 (two) times daily with a meal.   METOPROLOL SUCCINATE (TOPROL-XL) 50 MG 24 HR TABLET    Take 50 mg by mouth daily.    MULTIPLE VITAMIN (MULITIVITAMIN WITH MINERALS) TABS    Take 1 tablet by mouth daily.   NITROGLYCERIN (NITRODUR - DOSED IN MG/24 HR) 0.4 MG/HR    Place 1 patch onto the skin daily.   PIOGLITAZONE (ACTOS) 15 MG TABLET    Take 15 mg by mouth daily.   PRASUGREL (EFFIENT) 10 MG TABS TABLET    Take 10 mg by mouth daily.   PREDNICARBATE 0.1 % CREA    Take 1 application by mouth daily.    PREDNISONE (DELTASONE) 10 MG TABLET    Take 40 mg (4 tablets) orally for 2 days, then, Take 30 mg (3 tablets) orally for 2 days, then, Take 20 mg (2 tablets) orally for 2 days, then, Take 10 mg (1 tablet) orally for one day and then stop.   TIOTROPIUM (SPIRIVA HANDIHALER) 18 MCG INHALATION CAPSULE    Place 1 capsule  (18 mcg total) into inhaler and inhale daily.  Modified Medications   No medications on file  Discontinued Medications   No medications on file  Physical Exam: Filed Vitals:   08/28/13 1049  BP: 148/92  Pulse: 88  Temp: 98.3 F (36.8 C)  TempSrc: Oral  Height: 5\' 6"  (1.676 m)  Weight: 202 lb (91.627 kg)   Physical Exam  Constitutional: He is oriented to person, place, and time. He appears well-developed.  Eyes:  blind  Cardiovascular: Normal rate, regular rhythm, normal heart sounds and intact distal pulses.   Pulmonary/Chest: Effort normal and breath sounds normal.  Abdominal: Soft. Bowel sounds are normal.  Neurological: He is alert and oriented to person, place, and time.  Skin: Skin is warm and dry.     Labs reviewed: Basic Metabolic Panel:  Recent Labs  16/10/96 1128 08/07/13 0525 08/07/13 1217 08/11/13 2004  NA 136 137  --  134*  K 3.1* 4.1  --  3.7  CL 100 100  --  97  CO2 23 25  --  24  GLUCOSE 491* 297* 425* 390*  BUN 8 11  --  15  CREATININE 1.05 0.98  --  1.15  CALCIUM 9.3 9.8  --  9.5   Liver Function Tests:  Recent Labs  03/16/13 1006 08/07/13 0525  AST 29 47*  ALT 14 22  ALKPHOS 88 108  BILITOT 0.4 0.6  PROT 7.4 7.7  ALBUMIN  --  3.6  CBC:  Recent Labs  03/16/13 1006  06/08/13 1306 08/06/13 1128 08/07/13 0525 08/11/13 2004  WBC 6.2  < > 6.2 5.8 6.9 8.4  NEUTROABS 3.1  --  3.4  --   --  5.8  HGB 12.8  < > 12.2* 11.0* 12.2* 11.8*  HCT 39.9  < > 39.1 32.9* 37.9* 35.8*  MCV 90  < > 90 84.4 84.0 83.6  PLT  --   < >  --  151 156 203  < > = values in this interval not displayed.  Lab Results  Component Value Date   HGBA1C 11.4* 08/07/2013   Assessment/Plan 1. Type II diabetes mellitus with neurological manifestations, uncontrolled - foot exam benign today, follow up with Cathey next week as planned -needs to bring his glucometer and bp cuff and readings when recorded--importance of this stressed once again today -  Hemoglobin A1c; Future - Lipid panel; Future - Comprehensive metabolic panel; Future - POC Glucose (CBG)  2. Coronary atherosclerosis of native coronary artery - discussed that he is at very high risk for another MI due to his uncontrolled DMII - Lipid panel; Future before next regular appt in 3 mos, but want to see him in one month to f/u on his DMII  3. Type II or unspecified type diabetes mellitus with peripheral circulatory disorders, uncontrolled(250.72) - not c/o the intermittent claudication I thought he had previously - Hemoglobin A1c; Future - Lipid panel; Future - Comprehensive metabolic panel; Future  Labs/tests ordered:  F/u cmp, lipid, hba1c before visit in 3 mos Next appt: about 4 wks to f/u on diabetes control--hopefully will bring his cbg results and bp results so I can actually adjust his regimen

## 2013-08-28 NOTE — Patient Instructions (Signed)
Please bring any glucose readings or blood pressure readings written down and your glucometer and BP cuff so we can review the results.

## 2013-09-04 ENCOUNTER — Ambulatory Visit (INDEPENDENT_AMBULATORY_CARE_PROVIDER_SITE_OTHER): Payer: Medicare Other | Admitting: Pharmacotherapy

## 2013-09-04 ENCOUNTER — Encounter: Payer: Self-pay | Admitting: Pharmacotherapy

## 2013-09-04 VITALS — BP 146/82 | HR 79 | Temp 98.2°F | Resp 14 | Ht 66.0 in | Wt 205.0 lb

## 2013-09-04 DIAGNOSIS — I10 Essential (primary) hypertension: Secondary | ICD-10-CM

## 2013-09-04 DIAGNOSIS — E1142 Type 2 diabetes mellitus with diabetic polyneuropathy: Secondary | ICD-10-CM

## 2013-09-04 DIAGNOSIS — E1149 Type 2 diabetes mellitus with other diabetic neurological complication: Secondary | ICD-10-CM

## 2013-09-04 DIAGNOSIS — IMO0002 Reserved for concepts with insufficient information to code with codable children: Secondary | ICD-10-CM

## 2013-09-04 NOTE — Progress Notes (Signed)
  Subjective:    Eddie Graves is a 79 y.o. African American male who presents for follow-up of Type 2 diabetes mellitus.   Last OV we started Tradjenta in addition to his Actos and metformin. Trying to minimize potential for hypoglycemia as he lives alone and is blind.  Denies hypoglycemia. Feeling pretty good. Average BG:  115m/dl  Still making poor food choices.  He says it is "easier said than done" to eat healthy. No exercise.  [facility] Does have peripheral edema. He has some pain and soreness on sides of feet. No recent eye exams. Nocturia 2-3 times per night.   Review of Systems A comprehensive review of systems was negative except for: Eyes: positive for blindness Cardiovascular: positive for lower extremity edema Genitourinary: positive for nocturia    Objective:    BP 146/82  Pulse 79  Temp(Src) 98.2 F (36.8 C) (Oral)  Resp 14  Ht 5' 6"$  (1.676 m)  Wt 205 lb (92.987 kg)  BMI 33.1 kg/m2  General:  alert, cooperative, appears stated age and no distress  Oropharynx: normal findings: lips normal without lesions and gums healthy   Eyes:  He is blind.  Wears dark glasses at all times.   Ears:  external ears normal        Lung: clear to auscultation bilaterally  Heart:  regularly irregular rhythm     Extremities: edema in bilateral lower extremities  Skin: dry     Neuro: mental status, speech normal, alert and oriented x3   Lab Review Glucose (mg/dL)  Date Value  06/08/2013 271*  03/16/2013 122*     Glucose, Bld (mg/dL)  Date Value  08/11/2013 390*  08/07/2013 425*  08/07/2013 297*     CO2 (mEq/L)  Date Value  08/11/2013 24   08/07/2013 25   08/06/2013 23      BUN (mg/dL)  Date Value  08/11/2013 15   08/07/2013 11   08/06/2013 8   06/08/2013 16   03/16/2013 9      Creatinine, Ser (mg/dL)  Date Value  08/11/2013 1.15   08/07/2013 0.98   08/06/2013 1.05      Assessment:    Diabetes Mellitus type II, under  good control.   Has improved since last OV. BP above target <140/80   Plan:    1.  Rx changes: none 2.  Continue Tradjenta 589mQD, Actos 1581mD, and metformin 500m45mD. 3.  Counseled on nutrition goals.  Needs to improve efforts to make healthy choices when eating out at restaurants.   4.  Counseled on benefit of routine exercise.  Goal is 30-45 minutes 5 x week.   5.  Counseled on foot care.  He is to ask someone to examine his feet at least weekly to look for sores or infection. 6.  BP above target today (goal <140/80).  Will continue metformin, lisinopril at this time and monitor. 7.  On atorvastatin for dyslipidemia.  Will check FLP prior to next OV.

## 2013-09-04 NOTE — Patient Instructions (Signed)
Make better food choices Exercise daily.

## 2013-09-22 ENCOUNTER — Other Ambulatory Visit: Payer: Self-pay | Admitting: *Deleted

## 2013-09-22 MED ORDER — ESOMEPRAZOLE MAGNESIUM 40 MG PO CPDR: 40.0000 mg | DELAYED_RELEASE_CAPSULE | Freq: Every day | ORAL | Status: DC

## 2013-10-05 ENCOUNTER — Encounter: Payer: Self-pay | Admitting: Internal Medicine

## 2013-10-05 ENCOUNTER — Ambulatory Visit (INDEPENDENT_AMBULATORY_CARE_PROVIDER_SITE_OTHER): Payer: Medicare Other | Admitting: Internal Medicine

## 2013-10-05 VITALS — BP 136/74 | HR 72 | Temp 97.9°F | Wt 205.8 lb

## 2013-10-05 DIAGNOSIS — I798 Other disorders of arteries, arterioles and capillaries in diseases classified elsewhere: Secondary | ICD-10-CM

## 2013-10-05 DIAGNOSIS — I2510 Atherosclerotic heart disease of native coronary artery without angina pectoris: Secondary | ICD-10-CM

## 2013-10-05 DIAGNOSIS — F329 Major depressive disorder, single episode, unspecified: Secondary | ICD-10-CM

## 2013-10-05 DIAGNOSIS — E1151 Type 2 diabetes mellitus with diabetic peripheral angiopathy without gangrene: Secondary | ICD-10-CM

## 2013-10-05 DIAGNOSIS — E1159 Type 2 diabetes mellitus with other circulatory complications: Secondary | ICD-10-CM

## 2013-10-05 DIAGNOSIS — F3289 Other specified depressive episodes: Secondary | ICD-10-CM

## 2013-10-05 DIAGNOSIS — M48062 Spinal stenosis, lumbar region with neurogenic claudication: Secondary | ICD-10-CM

## 2013-10-05 DIAGNOSIS — I10 Essential (primary) hypertension: Secondary | ICD-10-CM

## 2013-10-05 DIAGNOSIS — R413 Other amnesia: Secondary | ICD-10-CM

## 2013-10-05 MED ORDER — ALBUTEROL SULFATE HFA 108 (90 BASE) MCG/ACT IN AERS: 2.0000 | INHALATION_SPRAY | RESPIRATORY_TRACT | Status: DC | PRN

## 2013-10-05 MED ORDER — GLUCOSE BLOOD VI STRP: ORAL_STRIP | Status: DC

## 2013-10-05 NOTE — Progress Notes (Signed)
Patient ID: Eddie Graves, male   DOB: [birth year], 79 y.o.   MRN: [id number] Location:  [facility] / [place] [facility]  Code Status: full code  No Known Allergies  Chief Complaint  Patient presents with  . Medical Managment of Chronic Issues    4 week f/u   . Immunizations    needs Tdap & Pneumo    HPI: Patient is a 79 y.o. AA male seen in the office today for medical management of chronic issues.  States that blood sugar has been up and down. Has not been consistent. However looking at his glucose meter his sugar is running between 120-180. Most of his reading were <150. Pt. States that he checks his sugar in the morning before he eats and and night before he eats. Pt. States that he has not changed the foods that he eats but has decreased his portions. Denies ever feeling clammy or light headed. Has had times when he has felt weak and shaky but resolved when he ate. States those episodes do not happen that often. States that his last one was on Monday but that was the first one in months.   Pt. Brought in his blood pressure meter and well as his notebook with his readings written down. His SBP has been running 120-160s and DBP 60-80s.His lowest reading was 97/62.  Pt states that he thinks he is in a good mood all the time but he has other people tell him he can be mean.  Lucila Maine states that his mood varies. States that his grandpa's mood fluctuates with what is occuring during the day.    Now only working 2-3 days per week.  Was interviewed for another job here in [place].    Review of Systems:  Review of Systems  Constitutional: Negative for weight loss.  Eyes:       Blind  Respiratory: Positive for shortness of breath.        Throughout the day  Cardiovascular: Negative for chest pain.  Gastrointestinal: Negative for nausea, vomiting, diarrhea and constipation.  Genitourinary: Negative for dysuria, urgency and frequency.  Musculoskeletal: Negative for  falls.  Neurological: Negative for dizziness and headaches.  Psychiatric/Behavioral: Positive for memory loss. Negative for depression. The patient is nervous/anxious and has insomnia.        States that he goes to bed at 9pm and wakes up at 12am and stays up the rest of the time. States that he does nap during the day.  Feels that his memory is getting worse     Past Medical History  Diagnosis Date  . DM (diabetes mellitus) type II controlled peripheral vascular disorder   . Essential hypertension, benign   . Blind   . Headache(784.0)   . Sebaceous cyst   . Chronic airway obstruction, not elsewhere classified   . Cough   . Spinal stenosis, lumbar region, with neurogenic claudication   . Coronary atherosclerosis of native coronary artery   . Vitamin D deficiency   . Rash and other nonspecific skin eruption   . Other and unspecified hyperlipidemia   . Anxiety state, unspecified   . Depressive disorder, not elsewhere classified   . Legal blindness, as defined in [place]   . Reflux esophagitis   . Unspecified constipation   . Pain in joint, site unspecified   . Memory loss   . Palpitations   . Type II diabetes mellitus with neurological manifestations, uncontrolled   . H/O hiatal hernia  Past Surgical History  Procedure Laterality Date  . Coronary angioplasty with stent placement    . Hernia repair    . Eye surgery      Social History:   reports that he quit smoking about 6 months ago. His smoking use included Cigarettes. He has a 50 pack-year smoking history. He has never used smokeless tobacco. He reports that he does not drink alcohol or use illicit drugs.  Family History  Problem Relation Age of Onset  . Asthma Mother   . Diabetes Son   . Cancer Daughter   . Diabetes Son     Medications: Patient's Medications  New Prescriptions   No medications on file  Previous Medications   ACETAMINOPHEN (TYLENOL) 500 MG TABLET    Take 500 mg by mouth every 8 (eight) hours  as needed for pain.    ALBUTEROL (PROVENTIL HFA;VENTOLIN HFA) 108 (90 BASE) MCG/ACT INHALER    Inhale 2 puffs into the lungs every 2 (two) hours as needed for wheezing or shortness of breath.   ATORVASTATIN (LIPITOR) 40 MG TABLET    Take 40 mg by mouth every morning.   CHOLECALCIFEROL (VITAMIN D) 2000 UNITS CAPS    Take 1 capsule by mouth daily.   CILOSTAZOL (PLETAL) 100 MG TABLET    Take 100 mg by mouth 2 (two) times daily.   ESOMEPRAZOLE (NEXIUM) 40 MG CAPSULE    Take 1 capsule (40 mg total) by mouth daily before breakfast.   ETODOLAC (LODINE) 400 MG TABLET    Take 400 mg by mouth 2 (two) times daily.   GARLIC PO    Take 75 mg by mouth. 2 by mouth daily   HYDROCODONE-ACETAMINOPHEN (NORCO) 10-325 MG PER TABLET    Take 1 tablet by mouth every 8 (eight) hours as needed for pain.    HYDROCORTISONE CREAM-NYSTATIN CREAM-ZINC OXIDE    Apply 1 application topically 2 (two) times daily.   LEVETIRACETAM (KEPPRA) 500 MG TABLET    Take 1 tablet (500 mg total) by mouth every 12 (twelve) hours.   LINAGLIPTIN (TRADJENTA) 5 MG TABS TABLET    Take 5 mg by mouth daily.   LISINOPRIL (PRINIVIL,ZESTRIL) 10 MG TABLET    Take 10 mg by mouth daily.   METFORMIN (GLUCOPHAGE) 500 MG TABLET    Take 1 tablet (500 mg total) by mouth 2 (two) times daily with a meal.   METOPROLOL SUCCINATE (TOPROL-XL) 50 MG 24 HR TABLET    Take 50 mg by mouth daily.    MULTIPLE VITAMIN (MULITIVITAMIN WITH MINERALS) TABS    Take 1 tablet by mouth daily.   NITROGLYCERIN (NITRODUR - DOSED IN MG/24 HR) 0.4 MG/HR    Place 1 patch onto the skin daily.   PIOGLITAZONE (ACTOS) 15 MG TABLET    Take 15 mg by mouth daily.   PRASUGREL (EFFIENT) 10 MG TABS TABLET    Take 10 mg by mouth daily.   PREDNICARBATE 0.1 % CREA    Take 1 application by mouth daily.    TIOTROPIUM (SPIRIVA HANDIHALER) 18 MCG INHALATION CAPSULE    Place 1 capsule (18 mcg total) into inhaler and inhale daily.  Modified Medications   No medications on file  Discontinued Medications    PREDNISONE (DELTASONE) 10 MG TABLET    Take 40 mg (4 tablets) orally for 2 days, then, Take 30 mg (3 tablets) orally for 2 days, then, Take 20 mg (2 tablets) orally for 2 days, then, Take 10 mg (1 tablet) orally for one  day and then stop.     Physical Exam: Filed Vitals:   10/05/13 0836  BP: 136/74  Pulse: 72  Temp: 97.9 F (36.6 C)  TempSrc: Oral  Weight: 205 lb 12.8 oz (93.35 kg)  SpO2: 96%   Physical Exam  Constitutional: He is oriented to person, place, and time. He appears well-developed and well-nourished.  Eyes:  blind  Cardiovascular: Normal rate, regular rhythm, normal heart sounds and intact distal pulses.   Pulmonary/Chest: Effort normal and breath sounds normal.  Abdominal: Soft. Bowel sounds are normal.  Musculoskeletal: He exhibits edema.  Bilateral LE  Neurological: He is alert and oriented to person, place, and time.  Skin: Skin is warm.  Psychiatric: He has a normal mood and affect. His behavior is normal.   Labs reviewed: Basic Metabolic Panel:  Recent Labs  65/78/46 1128 08/07/13 0525 08/07/13 1217 08/11/13 2004  NA 136 137  --  134*  K 3.1* 4.1  --  3.7  CL 100 100  --  97  CO2 23 25  --  24  GLUCOSE 491* 297* 425* 390*  BUN 8 11  --  15  CREATININE 1.05 0.98  --  1.15  CALCIUM 9.3 9.8  --  9.5   Liver Function Tests:  Recent Labs  03/16/13 1006 08/07/13 0525  AST 29 47*  ALT 14 22  ALKPHOS 88 108  BILITOT 0.4 0.6  PROT 7.4 7.7  ALBUMIN  --  3.6   CBC:  Recent Labs  03/16/13 1006  06/08/13 1306 08/06/13 1128 08/07/13 0525 08/11/13 2004  WBC 6.2  < > 6.2 5.8 6.9 8.4  NEUTROABS 3.1  --  3.4  --   --  5.8  HGB 12.8  < > 12.2* 11.0* 12.2* 11.8*  HCT 39.9  < > 39.1 32.9* 37.9* 35.8*  MCV 90  < > 90 84.4 84.0 83.6  PLT  --   < >  --  151 156 203  < > = values in this interval not displayed.  Lab Results  Component Value Date   HGBA1C 11.4* 08/07/2013   Assessment/Plan 1. Essential hypertension, benign Majority of bps  are at goal Advised that when diastolic reads over 100 to recheck in his other arm to confirm it is correct  2. Depressive disorder, not elsewhere classified -Refuses medication for this  3. Memory loss -memory and thinking much improved as glucose and bp are controlled now  4. DM (diabetes mellitus) type II controlled peripheral vascular disorder -glucose levels appear at goal now based on glucometer -not yet due for hba1c until 11/25 before visit with Cathey  5. Coronary atherosclerosis of native coronary artery -has dyspnea on exertion--advised he needs to stop "borrowing" cigarettes from friends and quit altogether -[facility]--form was completed today for this  6. Spinal stenosis, lumbar region, with neurogenic claudication -makes standing for any length of time painful for him so scat will be better (and, of course, he is also blind)  Labs/tests ordered: has upcoming labs 11/25 for Cathey and f/u visit with her Next appt: 4 mos

## 2013-10-31 ENCOUNTER — Other Ambulatory Visit: Payer: Medicare Other

## 2013-11-06 ENCOUNTER — Ambulatory Visit: Payer: Medicare Other | Admitting: Pharmacotherapy

## 2013-11-06 ENCOUNTER — Other Ambulatory Visit: Payer: Self-pay | Admitting: *Deleted

## 2013-11-06 MED ORDER — PIOGLITAZONE HCL 15 MG PO TABS: 15.0000 mg | ORAL_TABLET | Freq: Every day | ORAL | Status: DC

## 2013-12-11 ENCOUNTER — Encounter: Payer: Self-pay | Admitting: Internal Medicine

## 2013-12-18 ENCOUNTER — Encounter: Payer: Self-pay | Admitting: Internal Medicine

## 2013-12-18 ENCOUNTER — Ambulatory Visit (INDEPENDENT_AMBULATORY_CARE_PROVIDER_SITE_OTHER): Payer: Medicare Other | Admitting: Internal Medicine

## 2013-12-18 VITALS — BP 130/72 | HR 82 | Temp 97.7°F | Resp 14 | Wt 208.0 lb

## 2013-12-18 DIAGNOSIS — E1149 Type 2 diabetes mellitus with other diabetic neurological complication: Secondary | ICD-10-CM

## 2013-12-18 DIAGNOSIS — I609 Nontraumatic subarachnoid hemorrhage, unspecified: Secondary | ICD-10-CM

## 2013-12-18 DIAGNOSIS — I10 Essential (primary) hypertension: Secondary | ICD-10-CM

## 2013-12-18 DIAGNOSIS — M48062 Spinal stenosis, lumbar region with neurogenic claudication: Secondary | ICD-10-CM

## 2013-12-18 DIAGNOSIS — E1142 Type 2 diabetes mellitus with diabetic polyneuropathy: Secondary | ICD-10-CM

## 2013-12-18 DIAGNOSIS — F329 Major depressive disorder, single episode, unspecified: Secondary | ICD-10-CM

## 2013-12-18 DIAGNOSIS — E1159 Type 2 diabetes mellitus with other circulatory complications: Secondary | ICD-10-CM

## 2013-12-18 DIAGNOSIS — IMO0002 Reserved for concepts with insufficient information to code with codable children: Secondary | ICD-10-CM

## 2013-12-18 DIAGNOSIS — F3289 Other specified depressive episodes: Secondary | ICD-10-CM

## 2013-12-18 DIAGNOSIS — E1165 Type 2 diabetes mellitus with hyperglycemia: Secondary | ICD-10-CM

## 2013-12-18 MED ORDER — SERTRALINE HCL 25 MG PO TABS: 25.0000 mg | ORAL_TABLET | Freq: Every day | ORAL | Status: DC

## 2013-12-18 NOTE — Progress Notes (Signed)
Patient ID: Eddie Graves, male   DOB: 1931-01-26, 80 y.o.   MRN: [id number]   Location:  [facility] / [place] [facility]  Code Status: full code   No Known Allergies  Chief Complaint  Patient presents with  . Medical Managment of Chronic Issues    f/u   . Immunizations    will check Mysis  . other    pt has c/o's of bilateral leg/shoulder pain, numbness in LT hand, and RT left tingling in lower leg off/on.    HPI: Patient is a 80 y.o. 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  Says he is a total wreck.  He retired in Dec and he just can't get it together.  Mood is poor.  Is also bored.  [place]ited family down east--[place] until last week.  Says they are worse than he is.  Young folks arguing, not employed, into drugs, etc.  Now back and lacks a routine.  Can't get out and walk b/c it's too dangerous plus his legs hurt him.  Says he cannot sleep--wakes up 4 hours after he goes to bed.  Cannot get back to sleep.  Taking naps involuntarily.  Does listen to news.  Does listen to books on tape through the whole book for 12 hours.  Goes to church on weekends.  Discussed senior center.  Hasn't heard anything back from scat yet so unable to go out except to church and when someone takes him to the store.    Did pretty well monitoring his bp and sugar for a while.  BP has been good.  Has been in 130s-140s which is wonderful for him.  Has difficulty checking his cbgs and getting the blood on the test strip.  Hypoglycemia 3x when had not eaten consistently.  Also did not eat proper diet when visiting his family. Forgot glucometer.  Has not checked glucose in over a month except 1-2x.  Says before retiring, left  hand became numb and could not put lenses into machine.  Comes and goes.    Both legs very painful--cannot stand up straight and has to lean on the counter due to pain.  Grandson having too many problems with wife and kids to help him fix his meds.     Still smokes if he is asked if he wants a cigarette.  Can't resist.    Review of Systems:  Review of Systems  Constitutional: Positive for malaise/fatigue.  Eyes:       Legally blind  Gastrointestinal: Negative for heartburn, constipation, blood in stool and melena.  Genitourinary: Negative for dysuria, urgency and frequency.  Musculoskeletal: Positive for back pain, joint pain and myalgias. Negative for falls.  Neurological: Positive for weakness. Negative for dizziness and loss of consciousness.  Endo/Heme/Allergies: Does not bruise/bleed easily.  Psychiatric/Behavioral: Positive for depression and memory loss. The patient has insomnia.      Past Medical History  Diagnosis Date  . DM (diabetes mellitus) type II controlled peripheral vascular disorder   . Essential hypertension, benign   . Blind   . Headache(784.0)   . Sebaceous cyst   . Chronic airway obstruction, not elsewhere classified   . Cough   . Spinal stenosis, lumbar region, with neurogenic claudication   . Coronary atherosclerosis of native coronary artery   .  Vitamin D deficiency   . Rash and other nonspecific skin eruption   . Other and unspecified hyperlipidemia   . Anxiety state, unspecified   . Depressive disorder, not elsewhere classified   . Legal blindness, as defined in [place]   . Reflux esophagitis   . Unspecified constipation   . Pain in joint, site unspecified   . Memory loss   . Palpitations   . Type II diabetes mellitus with neurological manifestations, uncontrolled   . H/O hiatal hernia     Past Surgical History  Procedure Laterality Date  . Coronary angioplasty with stent placement    . Hernia repair    . Eye surgery      Social History:    reports that he quit smoking about 9 months ago. His smoking use included Cigarettes. He has a 50 pack-year smoking history. He has never used smokeless tobacco. He reports that he does not drink alcohol or use illicit drugs.  Family History  Problem Relation Age of Onset  . Asthma Mother   . Diabetes Son   . Cancer Daughter   . Diabetes Son     Medications: Patient's Medications  New Prescriptions   No medications on file  Previous Medications   ACETAMINOPHEN (TYLENOL) 500 MG TABLET    Take 500 mg by mouth every 8 (eight) hours as needed for pain.    ALBUTEROL (PROVENTIL HFA;VENTOLIN HFA) 108 (90 BASE) MCG/ACT INHALER    Inhale 2 puffs into the lungs every 2 (two) hours as needed for wheezing or shortness of breath.   ATORVASTATIN (LIPITOR) 40 MG TABLET    Take 40 mg by mouth every morning.   CHOLECALCIFEROL (VITAMIN D) 2000 UNITS CAPS    Take 1 capsule by mouth daily.   CILOSTAZOL (PLETAL) 100 MG TABLET    Take 100 mg by mouth 2 (two) times daily.   ESOMEPRAZOLE (NEXIUM) 40 MG CAPSULE    Take 1 capsule (40 mg total) by mouth daily before breakfast.   ETODOLAC (LODINE) 400 MG TABLET    Take 400 mg by mouth 2 (two) times daily.   GARLIC PO    Take 75 mg by mouth. 2 by mouth daily   GLUCOSE BLOOD TEST STRIP    Check blood sugar 2 x daily   HYDROCODONE-ACETAMINOPHEN (NORCO) 10-325 MG PER TABLET    Take 1 tablet by mouth every 8 (eight) hours as needed for pain.    HYDROCORTISONE CREAM-NYSTATIN CREAM-ZINC OXIDE    Apply 1 application topically 2 (two) times daily.   LINAGLIPTIN (TRADJENTA) 5 MG TABS TABLET    Take 5 mg by mouth daily.   LISINOPRIL (PRINIVIL,ZESTRIL) 10 MG TABLET    Take 10 mg by mouth daily.   METFORMIN (GLUCOPHAGE) 500 MG TABLET    Take 1 tablet (500 mg total) by mouth 2 (two) times daily with a meal.   METOPROLOL SUCCINATE (TOPROL-XL) 50 MG 24 HR TABLET    Take 50 mg by mouth daily.    MULTIPLE VITAMIN (MULITIVITAMIN WITH MINERALS) TABS    Take 1 tablet by mouth daily.     NITROGLYCERIN (NITRODUR - DOSED IN MG/24 HR) 0.4 MG/HR    Place 1 patch onto the skin daily.   PIOGLITAZONE (ACTOS) 15 MG TABLET    Take 1 tablet (15 mg total) by mouth daily.   PRASUGREL (EFFIENT) 10 MG TABS TABLET    Take 10 mg by mouth daily.   PREDNICARBATE 0.1 % CREA    Take 1 application  by mouth daily.    TIOTROPIUM (SPIRIVA HANDIHALER) 18 MCG INHALATION CAPSULE    Place 1 capsule (18 mcg total) into inhaler and inhale daily.  Modified Medications   No medications on file  Discontinued Medications   LEVETIRACETAM (KEPPRA) 500 MG TABLET    Take 1 tablet (500 mg total) by mouth every 12 (twelve) hours.     Physical Exam: Filed Vitals:   12/18/13 1025  BP: 130/72  Pulse: 82  Temp: 97.7 F (36.5 C)  TempSrc: Oral  Resp: 14  Weight: 208 lb (94.348 kg)  SpO2: 98%  Physical Exam  Constitutional: He is oriented to person, place, and time. He appears well-developed and well-nourished. No distress.  Cardiovascular: Normal rate, regular rhythm and normal heart sounds.   Pulmonary/Chest: Effort normal and breath sounds normal. No respiratory distress.  Abdominal: Soft. Bowel sounds are normal. He exhibits no distension and no mass. There is no tenderness.  Musculoskeletal: Normal range of motion. He exhibits tenderness.  Lumbar paravertebral muscle tenderness  Neurological: He is alert and oriented to person, place, and time.  Skin: Skin is warm and dry.  Psychiatric: He has a normal mood and affect.    Labs reviewed: Basic Metabolic Panel:  Recent Labs  77/41/28 1128 08/07/13 0525 08/07/13 1217 08/11/13 2004  NA 136 137  --  134*  K 3.1* 4.1  --  3.7  CL 100 100  --  97  CO2 23 25  --  24  GLUCOSE 491* 297* 425* 390*  BUN 8 11  --  15  CREATININE 1.05 0.98  --  1.15  CALCIUM 9.3 9.8  --  9.5   Liver Function Tests:  Recent Labs  03/16/13 1006 08/07/13 0525  AST 29 47*  ALT 14 22  ALKPHOS 88 108  BILITOT 0.4 0.6  PROT 7.4 7.7  ALBUMIN  --  3.6   CBC:  Recent Labs  03/16/13 1006  06/08/13 1306 08/06/13 1128 08/07/13 0525 08/11/13 2004  WBC 6.2  < > 6.2 5.8 6.9 8.4  NEUTROABS 3.1  --  3.4  --   --  5.8  HGB 12.8  < > 12.2* 11.0* 12.2* 11.8*  HCT 39.9  < > 39.1 32.9* 37.9* 35.8*  MCV 90  < > 90 84.4 84.0 83.6  PLT  --   < >  --  151 156 203  < > = values in this interval not displayed.  Lab Results  Component Value Date   HGBA1C 11.4* 08/07/2013   Assessment/Plan 1. Type II or unspecified type diabetes mellitus with peripheral circulatory disorders, uncontrolled(250.72) -pt not checking his sugars as directed and has difficulty doing this himself due to his blindness, has finally retired and is now homebound -will refer for home health RN to evaluate and help with his diabetes as well as his medication pillboxes  2. Type II diabetes mellitus with neurological manifestations, uncontrolled -f/u hba1c today--probably worse since traveling with family and not following diet   3. Spinal stenosis, lumbar region, with neurogenic claudication -worse left leg neuropathic pain lately--suspect partially due to his poor diabetic control so encouraged him to be more adherent  4. Depressive disorder, not elsewhere classified -seems improved slightly to me but patient says it is worse since retirement--agrees at last to take an antidepressant so will start low dose zoloft and have him return in 1 month  5. SAH (subarachnoid hemorrhage) -also caused sensory loss in arm and leg, but was consistent -symptoms how transient  suggestive of hyperglycemia worsening previously affected region of his brain  6. Essential hypertension, benign -bp finally better since taking it and being better about taking these meds, less stress with going to work  7.  Smoking cessation -says he does want to quit now--will address again in 4 wks after he is on his ssri -would not use wellbutrin due to potential seizure focus with old hemorrhage  Labs/tests  ordered:   Orders Placed This Encounter  Procedures  . CBC with Differential  . Comprehensive metabolic panel  . Hemoglobin A1c  . Ambulatory referral to Home Health    Referral Priority:  Routine    Referral Type:  Home Health Care    Referral Reason:  Specialty Services Required    Requested Specialty:  Home Health Services    Number of Visits Requested:  1    Next appt:  1 month re: depression, diabetes and smoking cessation

## 2013-12-19 LAB — HEMOGLOBIN A1C
Est. average glucose Bld gHb Est-mCnc: 180 mg/dL
Hgb A1c MFr Bld: 7.9 % — ABNORMAL HIGH (ref 4.8–5.6)

## 2013-12-19 LAB — COMPREHENSIVE METABOLIC PANEL
ALT: 9 IU/L (ref 0–44)
AST: 23 IU/L (ref 0–40)
Albumin/Globulin Ratio: 1.4 (ref 1.1–2.5)
Albumin: 4.2 g/dL (ref 3.5–4.7)
Alkaline Phosphatase: 77 IU/L (ref 39–117)
BUN/Creatinine Ratio: 17 (ref 10–22)
BUN: 27 mg/dL (ref 8–27)
CO2: 22 mmol/L (ref 18–29)
Calcium: 10 mg/dL (ref 8.6–10.2)
Chloride: 99 mmol/L (ref 97–108)
Creatinine, Ser: 1.6 mg/dL — ABNORMAL HIGH (ref 0.76–1.27)
GFR calc Af Amer: 46 mL/min/{1.73_m2} — ABNORMAL LOW (ref >59)
GFR calc non Af Amer: 40 mL/min/{1.73_m2} — ABNORMAL LOW (ref >59)
Globulin, Total: 2.9 g/dL (ref 1.5–4.5)
Glucose: 141 mg/dL — ABNORMAL HIGH (ref 65–99)
Potassium: 4.5 mmol/L (ref 3.5–5.2)
Sodium: 136 mmol/L (ref 134–144)
Total Bilirubin: 0.4 mg/dL (ref 0.0–1.2)
Total Protein: 7.1 g/dL (ref 6.0–8.5)

## 2013-12-19 LAB — CBC WITH DIFFERENTIAL/PLATELET
Basophils Absolute: 0 10*3/uL (ref 0.0–0.2)
Basos: 1 %
Eos: 5 %
Eosinophils Absolute: 0.3 10*3/uL (ref 0.0–0.4)
HCT: 38.4 % (ref 37.5–51.0)
Hemoglobin: 12.1 g/dL — ABNORMAL LOW (ref 12.6–17.7)
Immature Grans (Abs): 0 10*3/uL (ref 0.0–0.1)
Immature Granulocytes: 0 %
Lymphocytes Absolute: 2.3 10*3/uL (ref 0.7–3.1)
Lymphs: 36 %
MCH: 28.1 pg (ref 26.6–33.0)
MCHC: 31.5 g/dL (ref 31.5–35.7)
MCV: 89 fL (ref 79–97)
Monocytes Absolute: 0.6 10*3/uL (ref 0.1–0.9)
Monocytes: 10 %
Neutrophils Absolute: 3.1 10*3/uL (ref 1.4–7.0)
Neutrophils Relative %: 48 %
RBC: 4.31 x10E6/uL (ref 4.14–5.80)
RDW: 15.7 % — ABNORMAL HIGH (ref 12.3–15.4)
WBC: 6.3 10*3/uL (ref 3.4–10.8)

## 2013-12-21 DIAGNOSIS — I10 Essential (primary) hypertension: Secondary | ICD-10-CM

## 2013-12-21 DIAGNOSIS — M48062 Spinal stenosis, lumbar region with neurogenic claudication: Secondary | ICD-10-CM

## 2013-12-21 DIAGNOSIS — E119 Type 2 diabetes mellitus without complications: Secondary | ICD-10-CM

## 2013-12-21 DIAGNOSIS — F329 Major depressive disorder, single episode, unspecified: Secondary | ICD-10-CM

## 2013-12-21 DIAGNOSIS — F3289 Other specified depressive episodes: Secondary | ICD-10-CM

## 2014-01-11 ENCOUNTER — Encounter: Payer: Self-pay | Admitting: Internal Medicine

## 2014-01-20 ENCOUNTER — Encounter (HOSPITAL_COMMUNITY): Payer: Self-pay | Admitting: Emergency Medicine

## 2014-01-20 ENCOUNTER — Emergency Department (HOSPITAL_COMMUNITY): Payer: Medicare Other

## 2014-01-20 ENCOUNTER — Emergency Department (HOSPITAL_COMMUNITY)
Admission: EM | Admit: 2014-01-20 | Discharge: 2014-01-20 | Disposition: A | Discharge status: Home / Self Care | Payer: Medicare Other | Attending: Emergency Medicine | Admitting: Emergency Medicine

## 2014-01-20 DIAGNOSIS — Z9861 Coronary angioplasty status: Secondary | ICD-10-CM | POA: Insufficient documentation

## 2014-01-20 DIAGNOSIS — J4489 Other specified chronic obstructive pulmonary disease: Secondary | ICD-10-CM | POA: Insufficient documentation

## 2014-01-20 DIAGNOSIS — Z7901 Long term (current) use of anticoagulants: Secondary | ICD-10-CM | POA: Insufficient documentation

## 2014-01-20 DIAGNOSIS — I10 Essential (primary) hypertension: Secondary | ICD-10-CM | POA: Insufficient documentation

## 2014-01-20 DIAGNOSIS — F329 Major depressive disorder, single episode, unspecified: Secondary | ICD-10-CM | POA: Insufficient documentation

## 2014-01-20 DIAGNOSIS — E1149 Type 2 diabetes mellitus with other diabetic neurological complication: Secondary | ICD-10-CM | POA: Insufficient documentation

## 2014-01-20 DIAGNOSIS — J449 Chronic obstructive pulmonary disease, unspecified: Secondary | ICD-10-CM | POA: Insufficient documentation

## 2014-01-20 DIAGNOSIS — R410 Disorientation, unspecified: Secondary | ICD-10-CM

## 2014-01-20 DIAGNOSIS — Z8719 Personal history of other diseases of the digestive system: Secondary | ICD-10-CM | POA: Insufficient documentation

## 2014-01-20 DIAGNOSIS — M79609 Pain in unspecified limb: Secondary | ICD-10-CM | POA: Insufficient documentation

## 2014-01-20 DIAGNOSIS — Z79899 Other long term (current) drug therapy: Secondary | ICD-10-CM | POA: Insufficient documentation

## 2014-01-20 DIAGNOSIS — R443 Hallucinations, unspecified: Secondary | ICD-10-CM | POA: Insufficient documentation

## 2014-01-20 DIAGNOSIS — I2510 Atherosclerotic heart disease of native coronary artery without angina pectoris: Secondary | ICD-10-CM | POA: Insufficient documentation

## 2014-01-20 DIAGNOSIS — I798 Other disorders of arteries, arterioles and capillaries in diseases classified elsewhere: Secondary | ICD-10-CM | POA: Insufficient documentation

## 2014-01-20 DIAGNOSIS — F411 Generalized anxiety disorder: Secondary | ICD-10-CM | POA: Insufficient documentation

## 2014-01-20 DIAGNOSIS — Z872 Personal history of diseases of the skin and subcutaneous tissue: Secondary | ICD-10-CM | POA: Insufficient documentation

## 2014-01-20 DIAGNOSIS — E1159 Type 2 diabetes mellitus with other circulatory complications: Secondary | ICD-10-CM | POA: Insufficient documentation

## 2014-01-20 DIAGNOSIS — F17200 Nicotine dependence, unspecified, uncomplicated: Secondary | ICD-10-CM | POA: Insufficient documentation

## 2014-01-20 DIAGNOSIS — F3289 Other specified depressive episodes: Secondary | ICD-10-CM | POA: Insufficient documentation

## 2014-01-20 DIAGNOSIS — F19921 Other psychoactive substance use, unspecified with intoxication with delirium: Secondary | ICD-10-CM | POA: Insufficient documentation

## 2014-01-20 DIAGNOSIS — H543 Unqualified visual loss, both eyes: Secondary | ICD-10-CM | POA: Insufficient documentation

## 2014-01-20 DIAGNOSIS — E785 Hyperlipidemia, unspecified: Secondary | ICD-10-CM | POA: Insufficient documentation

## 2014-01-20 DIAGNOSIS — F29 Unspecified psychosis not due to a substance or known physiological condition: Secondary | ICD-10-CM | POA: Insufficient documentation

## 2014-01-20 LAB — POCT I-STAT, CHEM 8
BUN: 12 mg/dL (ref 6–23)
CREATININE: 1.1 mg/dL (ref 0.50–1.35)
Calcium, Ion: 1.27 mmol/L (ref 1.13–1.30)
Chloride: 101 mEq/L (ref 96–112)
Glucose, Bld: 122 mg/dL — ABNORMAL HIGH (ref 70–99)
HCT: 44 % (ref 39.0–52.0)
Hemoglobin: 15 g/dL (ref 13.0–17.0)
Potassium: 3.8 mEq/L (ref 3.7–5.3)
SODIUM: 141 meq/L (ref 137–147)
TCO2: 25 mmol/L (ref 0–100)

## 2014-01-20 LAB — URINALYSIS, ROUTINE W REFLEX MICROSCOPIC
Bilirubin Urine: NEGATIVE
GLUCOSE, UA: NEGATIVE mg/dL
HGB URINE DIPSTICK: NEGATIVE
Ketones, ur: NEGATIVE mg/dL
LEUKOCYTES UA: NEGATIVE
Nitrite: NEGATIVE
Protein, ur: NEGATIVE mg/dL
Specific Gravity, Urine: 1.01 (ref 1.005–1.030)
Urobilinogen, UA: 0.2 mg/dL (ref 0.0–1.0)
pH: 6.5 (ref 5.0–8.0)

## 2014-01-20 LAB — HEPATIC FUNCTION PANEL
ALBUMIN: 3.8 g/dL (ref 3.5–5.2)
ALK PHOS: 90 U/L (ref 39–117)
ALT: 13 U/L (ref 0–53)
AST: 26 U/L (ref 0–37)
Bilirubin, Direct: <0.2 mg/dL (ref 0.0–0.3)
Total Bilirubin: 0.4 mg/dL (ref 0.3–1.2)
Total Protein: 8 g/dL (ref 6.0–8.3)

## 2014-01-20 LAB — GLUCOSE, CAPILLARY: Glucose-Capillary: 122 mg/dL — ABNORMAL HIGH (ref 70–99)

## 2014-01-20 NOTE — ED Notes (Signed)
Son states most of this has started since he was started on new prescription for Zoloft last month. Pt states he has leg pain and thinks he took too much medicine (speaking of his hydrocodone).

## 2014-01-20 NOTE — ED Provider Notes (Addendum)
CSN: [id number]     Arrival date & time 01/20/14  1438 History   First MD Initiated Contact with Patient 01/20/14 1532     Chief Complaint  Patient presents with  . Altered Mental Status  . Leg Pain    Right leg pain     (Consider location/radiation/quality/duration/timing/severity/associated sxs/prior Treatment) Patient is a 80 y.o. male presenting with altered mental status and leg pain. The history is provided by the patient.  Altered Mental Status Presenting symptoms: confusion   Presenting symptoms comment:  Visual hallucinations where he sees children and adults playing around him all the time but when he tries to touch them his hand goes right through Severity:  Moderate Most recent episode:  Today Episode history:  Multiple Duration:  2 weeks Timing:  Intermittent Progression:  Waxing and waning Chronicity:  New Context: recent change in medication   Context: not alcohol use, not dementia, not drug use, not head injury, not a nursing home resident, not a recent illness and not a recent infection   Associated symptoms: hallucinations   Associated symptoms: no abdominal pain, normal movement, no agitation, no bladder incontinence, no decreased appetite, no difficulty breathing, no fever, no headaches, no nausea, no palpitations, no slurred speech, no suicidal behavior, no vomiting and no weakness   Associated symptoms comment:  Pt started flexeril appx 2 weeks ago prior to sx starting Leg Pain Location:  Leg Injury: no   Leg location:  R leg, R upper leg and R lower leg Pain details:    Quality:  Aching and burning   Radiates to:  Does not radiate   Severity:  Moderate   Onset quality:  Gradual   Duration:  2 weeks   Timing:  Constant   Progression:  Worsening Chronicity:  Recurrent Relieved by:  Nothing Worsened by:  Activity Ineffective treatments:  Arthritis medication and muscle relaxant Associated symptoms: no back pain, no decreased ROM, no fatigue, no fever  and no muscle weakness   Associated symptoms comment:  When walking over the last few weeks he has 2 episodes of his legs giving out on him bilaterally Risk factors: no frequent fractures, no known bone disorder and no recent illness     Past Medical History  Diagnosis Date  . DM (diabetes mellitus) type II controlled peripheral vascular disorder   . Essential hypertension, benign   . Blind   . Headache(784.0)   . Sebaceous cyst   . Chronic airway obstruction, not elsewhere classified   . Cough   . Spinal stenosis, lumbar region, with neurogenic claudication   . Coronary atherosclerosis of native coronary artery   . Vitamin D deficiency   . Rash and other nonspecific skin eruption   . Other and unspecified hyperlipidemia   . Anxiety state, unspecified   . Depressive disorder, not elsewhere classified   . Legal blindness, as defined in [place]   . Reflux esophagitis   . Unspecified constipation   . Pain in joint, site unspecified   . Memory loss   . Palpitations   . Type II diabetes mellitus with neurological manifestations, uncontrolled   . H/O hiatal hernia    Past Surgical History  Procedure Laterality Date  . Coronary angioplasty with stent placement    . Hernia repair    . Eye surgery     Family History  Problem Relation Age of Onset  . Asthma Mother   . Diabetes Son   . Cancer Daughter   . Diabetes Son  History  Substance Use Topics  . Smoking status: Current Every Day Smoker -- 1.00 packs/day for 50 years    Types: Cigarettes    Last Attempt to Quit: 03/07/2013  . Smokeless tobacco: Never Used  . Alcohol Use: No    Review of Systems  Constitutional: Negative for fever, fatigue and decreased appetite.  Cardiovascular: Negative for palpitations.  Gastrointestinal: Negative for nausea, vomiting and abdominal pain.  Genitourinary: Negative for bladder incontinence.  Musculoskeletal: Negative for back pain.  Neurological: Negative for weakness and headaches.   Psychiatric/Behavioral: Positive for hallucinations and confusion. Negative for agitation.  All other systems reviewed and are negative.      Allergies  Review of patient's allergies indicates no known allergies.  Home Medications   Current Outpatient Rx  Name  Route  Sig  Dispense  Refill  . acetaminophen (TYLENOL) 500 MG tablet   Oral   Take 500 mg by mouth every 8 (eight) hours as needed for pain.          Marland Kitchen albuterol (PROVENTIL HFA;VENTOLIN HFA) 108 (90 BASE) MCG/ACT inhaler   Inhalation   Inhale 2 puffs into the lungs every 2 (two) hours as needed for wheezing or shortness of breath.   18 g   3   . atorvastatin (LIPITOR) 40 MG tablet   Oral   Take 40 mg by mouth every morning.         . Cholecalciferol (VITAMIN D) 2000 UNITS CAPS   Oral   Take 1 capsule by mouth daily.         . cilostazol (PLETAL) 100 MG tablet   Oral   Take 100 mg by mouth 2 (two) times daily.         Marland Kitchen esomeprazole (NEXIUM) 40 MG capsule   Oral   Take 1 capsule (40 mg total) by mouth daily before breakfast.   30 capsule   5   . etodolac (LODINE) 400 MG tablet   Oral   Take 400 mg by mouth 2 (two) times daily.         Marland Kitchen GARLIC PO   Oral   Take 75 mg by mouth. 2 by mouth daily         . glucose blood test strip      Check blood sugar 2 x daily   100 each   12   . HYDROcodone-acetaminophen (NORCO) 10-325 MG per tablet   Oral   Take 1 tablet by mouth every 8 (eight) hours as needed for pain.          . hydrocortisone cream-nystatin cream-zinc oxide   Topical   Apply 1 application topically 2 (two) times daily.   1 Tube   3   . linagliptin (TRADJENTA) 5 MG TABS tablet   Oral   Take 5 mg by mouth daily.         Marland Kitchen lisinopril (PRINIVIL,ZESTRIL) 10 MG tablet   Oral   Take 10 mg by mouth daily.         . metFORMIN (GLUCOPHAGE) 500 MG tablet   Oral   Take 1 tablet (500 mg total) by mouth 2 (two) times daily with a meal.   60 tablet   0   . metoprolol  succinate (TOPROL-XL) 50 MG 24 hr tablet   Oral   Take 50 mg by mouth daily.          . Multiple Vitamin (MULITIVITAMIN WITH MINERALS) TABS   Oral   Take 1 tablet  by mouth daily.         . nitroGLYCERIN (NITRODUR - DOSED IN MG/24 HR) 0.4 mg/hr   Transdermal   Place 1 patch onto the skin daily.         . pioglitazone (ACTOS) 15 MG tablet   Oral   Take 1 tablet (15 mg total) by mouth daily.   30 tablet   3   . prasugrel (EFFIENT) 10 MG TABS tablet   Oral   Take 10 mg by mouth daily.         . Prednicarbate 0.1 % CREA   Oral   Take 1 application by mouth daily.          . sertraline (ZOLOFT) 25 MG tablet   Oral   Take 1 tablet (25 mg total) by mouth daily.   30 tablet   0   . tiotropium (SPIRIVA HANDIHALER) 18 MCG inhalation capsule   Inhalation   Place 1 capsule (18 mcg total) into inhaler and inhale daily.   30 capsule   0    BP 184/98  Pulse 93  Temp(Src) 97.6 F (36.4 C) (Oral)  Resp 20  Ht 5' 6.5" (1.689 m)  Wt 205 lb (92.987 kg)  BMI 32.60 kg/m2  SpO2 99% Physical Exam  Nursing note and vitals reviewed. Constitutional: He is oriented to person, place, and time. He appears well-developed and well-nourished. No distress.  HENT:  Head: Normocephalic and atraumatic.  Mouth/Throat: Oropharynx is clear and moist.  Eyes: Conjunctivae are normal.  blind  Neck: Normal range of motion. Neck supple.  Cardiovascular: Normal rate, regular rhythm and intact distal pulses.   No murmur heard. Pulmonary/Chest: Effort normal and breath sounds normal. No respiratory distress. He has no wheezes. He has no rales.  Abdominal: Soft. He exhibits no distension. There is no tenderness. There is no rebound and no guarding.  Musculoskeletal: Normal range of motion. He exhibits no edema and no tenderness.  Neurological: He is alert and oriented to person, place, and time. He has normal strength. No sensory deficit.  Pain in the right leg with straight leg raise on the  right and left.  Normal sensation in the leg.  No focal area of tenderness, erythema or induration to the right lower ext.  Skin: Skin is warm and dry. No rash noted. No erythema.  Psychiatric: He has a normal mood and affect. His speech is normal. His mood appears not anxious. His affect is not blunt. He is actively hallucinating. He does not exhibit a depressed mood. He expresses no suicidal ideation.    ED Course  Procedures (including critical care time) Labs Review Labs Reviewed  GLUCOSE, CAPILLARY - Abnormal; Notable for the following:    Glucose-Capillary 122 (*)    All other components within normal limits  POCT I-STAT, CHEM 8 - Abnormal; Notable for the following:    Glucose, Bld 122 (*)    All other components within normal limits  URINALYSIS, ROUTINE W REFLEX MICROSCOPIC  HEPATIC FUNCTION PANEL   Imaging Review Ct Head Wo Contrast  01/20/2014   CLINICAL DATA:  Hallucination, altered mental status  EXAM: CT HEAD WITHOUT CONTRAST  TECHNIQUE: Contiguous axial images were obtained from the base of the skull through the vertex without intravenous contrast.  COMPARISON:  CT HEAD WO/W CM dated 07/03/2013, interpreted by [facility] without report available  FINDINGS: No acute hemorrhage, infarct, or mass lesion is identified. No midline shift. Ventricles are normal in size. No skull fracture.  Mild cortical volume loss reidentified. Ethmoid sinus mucoperiosteal thickening is noted. 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  IMPRESSION: No acute intracranial finding. Stable degree of mild cortical volume loss.   Electronically Signed   By: Christiana Pellant M.D.   On: 01/20/2014 16:37    EKG Interpretation    Date/Time:  Saturday January 20 2014 14:52:02 EST Ventricular Rate:  105 PR Interval:  212 QRS Duration: 88 QT Interval:  334 QTC Calculation: 441 R Axis:   -48 Text Interpretation:  Sinus tachycardia with 1st degree A-V block  with Premature atrial complexes with Abberant conduction Left axis deviation Anterior infarct , age undetermined No significant change since last tracing Confirmed by Anitra Lauth  MD, Kayliegh Boyers [phone]) on 01/20/2014 3:52:14 PM            MDM   Final diagnoses:  Delirium, induced by drug    And with 2 separate complaints. The initial complaint is of right leg pain that is radicular in nature. It is reproduced with straight leg and contralateral straight leg raise. He is neurovascularly intact without sign of injury. No point tenderness to the leg on exam and no excessive swelling or areas concerning for cellulitis.  Patient has been going to Dr. Montez Morita for some time for degenerative disc disease in his back with bulging disc. He has had an epidural injection in the past but none recently. Approximately 2 weeks ago he was started on Flexeril and has been taking hydrocodone for some time.  He denies any bowel or bladder incontinence and states occasionally his legs will give out but no focal weakness.  Low suspicion for pathologic back pain at this time.  Secondly family states they brought him in because of his altered mental status over the last 2 weeks. They state he has had very vivid dreams and calls them very confused in the morning. Also patient states over the last 2-3 days he's been having visual hallucinations. He sees people and children playing around him but when he tries to touch than his hand goes straight through them. This all started around the time he started taking Flexeril.  At the beginning of January patient also started on Zoloft. Either of these medications could be causing his symptoms however he denies feeling any more depressed and no suicidal thoughts.  Patient denies any infectious symptoms and lower suspicion for an infectious etiology as the cause of his symptoms and feel that his delirium is most likely related to medications. He is mildly tachycardic and hypertensive here.  He is in no acute distress on exam and able to answer all questions. Patient is blind and does live alone and takes care of his medications. Family doesn't medicine box for him but states that he also has medicines around the house and he takes those as well so unclear what and how much medication patient is taking regularly.  5:57 PM All labs and head CT clear.  Will d/c home to stop flexeril and f/u with Dr. Montez Morita about possible epidural for pain control.  Gwyneth Sprout, MD 01/20/14 1758  Gwyneth Sprout, MD 01/20/14 [phone]

## 2014-01-20 NOTE — ED Notes (Signed)
Pt c/o right leg pain for couple of months and altered mental status ongoing for couple of weeks. Equal grips, no facial droop, speech clear, no arm drift. Pt currently AAOx3.

## 2014-01-20 NOTE — Discharge Instructions (Signed)
Stop taking flexeril.  Can take hydrocodone 3 times a day.  Only take tylenol in morning and night.

## 2014-01-20 NOTE — ED Notes (Signed)
Patient returned from CT

## 2014-01-20 NOTE — ED Notes (Signed)
Patient transported to CT 

## 2014-01-22 ENCOUNTER — Other Ambulatory Visit: Payer: Self-pay

## 2014-01-22 MED ORDER — LISINOPRIL 10 MG PO TABS: 10.0000 mg | ORAL_TABLET | Freq: Every day | ORAL | Status: DC

## 2014-01-25 ENCOUNTER — Encounter: Payer: Self-pay | Admitting: Internal Medicine

## 2014-01-25 ENCOUNTER — Ambulatory Visit (INDEPENDENT_AMBULATORY_CARE_PROVIDER_SITE_OTHER): Payer: Medicare Other | Admitting: Internal Medicine

## 2014-01-25 VITALS — BP 118/74 | HR 86 | Wt 201.0 lb

## 2014-01-25 DIAGNOSIS — E1165 Type 2 diabetes mellitus with hyperglycemia: Secondary | ICD-10-CM

## 2014-01-25 DIAGNOSIS — R410 Disorientation, unspecified: Secondary | ICD-10-CM

## 2014-01-25 DIAGNOSIS — E1149 Type 2 diabetes mellitus with other diabetic neurological complication: Secondary | ICD-10-CM

## 2014-01-25 DIAGNOSIS — M48062 Spinal stenosis, lumbar region with neurogenic claudication: Secondary | ICD-10-CM

## 2014-01-25 DIAGNOSIS — IMO0002 Reserved for concepts with insufficient information to code with codable children: Secondary | ICD-10-CM

## 2014-01-25 DIAGNOSIS — R404 Transient alteration of awareness: Secondary | ICD-10-CM

## 2014-01-25 DIAGNOSIS — R413 Other amnesia: Secondary | ICD-10-CM

## 2014-01-25 NOTE — Progress Notes (Signed)
Patient ID: Eddie Graves, male   DOB: 29-Mar-1931, 80 y.o.   MRN: [id number]   Location:  [facility] / [place]  Code Status: full code   No Known Allergies  Chief Complaint  Patient presents with  . Medical Managment of Chronic Issues    4 month f/u w/ no recent labs. needs refill on a Butt Cream.  . other    has been having hallucination while taking Cyclobenzaprine 10 mg given by other provider for Arthritis.     HPI: Patient is a 80 y.o. black male seen in the office today for hospital f/u after episode of delirium felt to be due to flexeril which was discontinued.  Tells me he was very confused for several days--didn't know where he was or what was going on.    5 different people came:  Social work, PT, OT, Charity fundraiser, waiting to hear from meals on wheels and scat program.  Lorne Skeens is going to move in with him.  Still not taking his glucose.  Got the strips for the talking meter that he had to order from [place].  Hasn't gotten a chance to do it yet b/c he was sick.  [name] has been calling to check if he needs his help.    Right leg sore from back at waistline down leg--much worse lately.  Can't take the flexeril Dr. Montez Morita prescribed, and really wants another epidural injection which was incredibly beneficial previously.  Pain is terrible when he walks.  Also aches just sitting in his chair.  Did MMSE 22 of the points (could not do all b/c of blindness).  19 score.  Named 17 animals in 1 minute.    Review of Systems:  Review of Systems  Constitutional: Negative for fever and chills.  Eyes:       Legally blind  Respiratory: Negative for shortness of breath.   Cardiovascular: Negative for chest pain.  Gastrointestinal: Negative for heartburn and constipation.  Genitourinary: Negative for dysuria.  Musculoskeletal: Positive for back pain. Negative for falls and myalgias.  Neurological: Positive for tingling. Negative for dizziness.    Psychiatric/Behavioral: Positive for memory loss.    Past Medical History  Diagnosis Date  . DM (diabetes mellitus) type II controlled peripheral vascular disorder   . Essential hypertension, benign   . Blind   . Headache(784.0)   . Sebaceous cyst   . Chronic airway obstruction, not elsewhere classified   . Cough   . Spinal stenosis, lumbar region, with neurogenic claudication   . Coronary atherosclerosis of native coronary artery   . Vitamin D deficiency   . Rash and other nonspecific skin eruption   . Other and unspecified hyperlipidemia   . Anxiety state, unspecified   . Depressive disorder, not elsewhere classified   . Legal blindness, as defined in [place]   . Reflux esophagitis   . Unspecified constipation   . Pain in joint, site unspecified   . Memory loss   . Palpitations   . Type II diabetes mellitus with neurological manifestations, uncontrolled   . H/O hiatal hernia     Past Surgical History  Procedure Laterality Date  . Coronary angioplasty with stent placement    . Hernia repair    . Eye surgery      Social History:   reports that he quit smoking about 10 months ago. His smoking use included Cigarettes. He has a 50 pack-year smoking history. He has never used smokeless tobacco. He reports that  he does not drink alcohol or use illicit drugs.  Family History  Problem Relation Age of Onset  . Asthma Mother   . Diabetes Son   . Cancer Daughter   . Diabetes Son     Medications: Patient's Medications  New Prescriptions   No medications on file  Previous Medications   ACETAMINOPHEN (TYLENOL) 500 MG TABLET    Take 500 mg by mouth every 8 (eight) hours as needed for pain.    ALBUTEROL (PROVENTIL HFA;VENTOLIN HFA) 108 (90 BASE) MCG/ACT INHALER    Inhale 2 puffs into the lungs every 2 (two) hours as needed for wheezing or shortness of breath.   CHOLECALCIFEROL (VITAMIN D) 2000 UNITS CAPS    Take 1 capsule by mouth daily.   CILOSTAZOL (PLETAL) 100 MG TABLET     Take 100 mg by mouth 2 (two) times daily.   ESOMEPRAZOLE (NEXIUM) 40 MG CAPSULE    Take 1 capsule (40 mg total) by mouth daily before breakfast.   ETODOLAC (LODINE) 400 MG TABLET    Take 400 mg by mouth 2 (two) times daily.   GARLIC PO    Take 75 mg by mouth. 2 by mouth daily   GLUCOSE BLOOD TEST STRIP    Check blood sugar 2 x daily   HYDROCODONE-ACETAMINOPHEN (NORCO) 10-325 MG PER TABLET    Take 1 tablet by mouth every 8 (eight) hours as needed for pain.    HYDROCORTISONE CREAM-NYSTATIN CREAM-ZINC OXIDE    Apply 1 application topically 2 (two) times daily.   LINAGLIPTIN (TRADJENTA) 5 MG TABS TABLET    Take 5 mg by mouth daily.   LISINOPRIL (PRINIVIL,ZESTRIL) 10 MG TABLET    Take 1 tablet (10 mg total) by mouth daily. FOR BLOOD PRESSURE   METFORMIN (GLUCOPHAGE) 500 MG TABLET    Take 1 tablet (500 mg total) by mouth 2 (two) times daily with a meal.   METOPROLOL SUCCINATE (TOPROL-XL) 50 MG 24 HR TABLET    Take 50 mg by mouth daily.    MULTIPLE VITAMIN (MULITIVITAMIN WITH MINERALS) TABS    Take 1 tablet by mouth daily.   NITROGLYCERIN (NITRODUR - DOSED IN MG/24 HR) 0.4 MG/HR    Place 1 patch onto the skin daily.   OMEGA 3 1200 MG CAPS    Take 2 capsules by mouth 2 (two) times daily.   SERTRALINE (ZOLOFT) 25 MG TABLET    Take 1 tablet (25 mg total) by mouth daily.  Modified Medications   No medications on file  Discontinued Medications   MULTIPLE VITAMINS-MINERALS (MULTIVITAMIN WITH MINERALS) TABLET    Take 1 tablet by mouth daily.     Physical Exam: Filed Vitals:   01/25/14 1454  BP: 118/74  Pulse: 86  Weight: 201 lb (91.173 kg)  SpO2: 96%  Physical Exam  Constitutional: He is oriented to person, place, and time. No distress.  Cardiovascular: Normal rate, regular rhythm, normal heart sounds and intact distal pulses.   Pulmonary/Chest: Effort normal and breath sounds normal. No respiratory distress.  Abdominal: Soft. Bowel sounds are normal. He exhibits no distension and no mass. There  is no tenderness.  Musculoskeletal: Normal range of motion. He exhibits tenderness. He exhibits no edema.  Right lower back radiating down right leg  Neurological: He is alert and oriented to person, place, and time.  Skin: Skin is warm and dry.     Labs reviewed: Basic Metabolic Panel:  Recent Labs  37/90/24 0525  08/11/13 2004 12/18/13 1123 01/20/14 1512  NA 137  --  134* 136 141  K 4.1  --  3.7 4.5 3.8  CL 100  --  97 99 101  CO2 25  --  24 22  --   GLUCOSE 297*  < > 390* 141* 122*  BUN 11  --  15 27 12   CREATININE 0.98  --  1.15 1.60* 1.10  CALCIUM 9.8  --  9.5 10.0  --   < > = values in this interval not displayed. Liver Function Tests:  Recent Labs  08/07/13 0525 12/18/13 1123 01/20/14 1604  AST 47* 23 26  ALT 22 9 13   ALKPHOS 108 77 90  BILITOT 0.6 0.4 0.4  PROT 7.7 7.1 8.0  ALBUMIN 3.6  --  3.8  CBC:  Recent Labs  06/08/13 1306 08/06/13 1128 08/07/13 0525 08/11/13 2004 12/18/13 1123 01/20/14 1512  WBC 6.2 5.8 6.9 8.4 6.3  --   NEUTROABS 3.4  --   --  5.8 3.1  --   HGB 12.2* 11.0* 12.2* 11.8* 12.1* 15.0  HCT 39.1 32.9* 37.9* 35.8* 38.4 44.0  MCV 90 84.4 84.0 83.6 89  --   PLT  --  151 156 203  --   --    Lab Results  Component Value Date   HGBA1C 7.9* 12/18/2013    Assessment/Plan 1. Acute delirium -has resolved with cessation of flexeril  2. Spinal stenosis, lumbar region, with neurogenic claudication -back pain with radiation down right leg has gotten much worse since he saw Dr. Rejeana Brock to move up his appt there to be seen sooner--he is hoping to get another back injection  3. Memory loss -seems minor at this point--notes some word finding difficulty--testing performed above was quite good, and pt remembered 2/3 words to recall when I came back into the room after the medical assistant left  4.  DMII uncontrolled with neurologic manifestations -advised once again that he MUST check his glucose in order for me to help him with  this -his son was also told how crucial this is and he plans to move in with him in a month or so  Labs/tests ordered:  None today Next appt:  3 mos with labs day of visit

## 2014-04-26 ENCOUNTER — Encounter: Payer: Self-pay | Admitting: Internal Medicine

## 2014-04-26 ENCOUNTER — Ambulatory Visit (INDEPENDENT_AMBULATORY_CARE_PROVIDER_SITE_OTHER): Payer: Medicare Other | Admitting: Internal Medicine

## 2014-04-26 VITALS — BP 168/88 | HR 65 | Temp 98.0°F | Resp 14 | Wt 208.0 lb

## 2014-04-26 DIAGNOSIS — F3289 Other specified depressive episodes: Secondary | ICD-10-CM

## 2014-04-26 DIAGNOSIS — I2510 Atherosclerotic heart disease of native coronary artery without angina pectoris: Secondary | ICD-10-CM

## 2014-04-26 DIAGNOSIS — M48062 Spinal stenosis, lumbar region with neurogenic claudication: Secondary | ICD-10-CM

## 2014-04-26 DIAGNOSIS — E1149 Type 2 diabetes mellitus with other diabetic neurological complication: Secondary | ICD-10-CM

## 2014-04-26 DIAGNOSIS — E1165 Type 2 diabetes mellitus with hyperglycemia: Secondary | ICD-10-CM

## 2014-04-26 DIAGNOSIS — IMO0002 Reserved for concepts with insufficient information to code with codable children: Secondary | ICD-10-CM

## 2014-04-26 DIAGNOSIS — Z720 Tobacco use: Secondary | ICD-10-CM | POA: Insufficient documentation

## 2014-04-26 DIAGNOSIS — F329 Major depressive disorder, single episode, unspecified: Secondary | ICD-10-CM

## 2014-04-26 DIAGNOSIS — E1159 Type 2 diabetes mellitus with other circulatory complications: Secondary | ICD-10-CM

## 2014-04-26 DIAGNOSIS — F17200 Nicotine dependence, unspecified, uncomplicated: Secondary | ICD-10-CM

## 2014-04-26 NOTE — Progress Notes (Signed)
Patient ID: Eddie Graves, male   DOB: February 02, 1931, 80 y.o.   MRN: [id number]   Location:  [facility]  Code Status: full code--will discuss during annual exam  Allergies  Allergen Reactions  . Flexeril [Cyclobenzaprine]     delirium    Chief Complaint  Patient presents with  . Medical Management of Chronic Issues    3 Month follow up. Patient needs Handicap Placard    HPI: Patient is a 80 y.o. black male seen in the office today for medical mgt of chronic diseases.  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  Has three people working for him.  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    Zoloft has helped a whole lot with sleep.    Feels less confident getting around.  Feels he has go places slower and walk slower.  Has to depend on people more.  Physically says he does not feel unsteady walking.  Hands hurt him more.  Says hurts in fingers when he bends them.  Dr. Montez Morita gave him a brace for the right hand--uses alternating on both.    Has carpal tunnel in both hands.    Is eating too much, he says.  His youngest son who came home has been cooking and they've been eating his good cooking.  He does not fry.    Has been smoking today.  Had been up to 5 cigarettes a day again, but back to 3.  Had cigarette before coming in today.    Has a long distance girlfriend.  She lives back where he is from--[place], [place].  She wants him to move back there and he doesn't want to move.    Did not bring glucometer--is going to get his test strips today--had run out.  CBGs 140s-150s.  Has been checking his bp at home, too.    Review of Systems:  Review of Systems  Constitutional: Negative for malaise/fatigue.  HENT: Positive for hearing loss. Negative for congestion.        Feels his hearing is deteriorating and affecting his memory  Eyes:       Blind  Respiratory: Positive for shortness of breath and wheezing.         Worse with sob and wheezing since restarted smoking  Cardiovascular: Negative for chest pain and leg swelling.  Gastrointestinal: Negative for constipation.  Genitourinary: Negative for dysuria, urgency and frequency.  Musculoskeletal: Positive for back pain. Negative for falls.       Not bothering him in his back--hurts in hips and legs  Skin: Negative for rash.  Neurological: Positive for loss of consciousness and headaches. Negative for dizziness and weakness.  Endo/Heme/Allergies: Does not bruise/bleed easily.  Psychiatric/Behavioral: Positive for depression. Negative for memory loss. The patient is nervous/anxious.        Still worries about memory;  Depression controlled with zoloft low dose     Past Medical History  Diagnosis Date  . DM (diabetes mellitus) type II controlled peripheral vascular disorder   . Essential hypertension, benign   . Blind   . Headache(784.0)   . Sebaceous cyst   . Chronic airway obstruction, not elsewhere classified   . Cough   . Spinal stenosis, lumbar region, with neurogenic claudication   . Coronary atherosclerosis of native coronary artery   . Vitamin D deficiency   . Rash and other nonspecific skin eruption   . Other and unspecified hyperlipidemia   .  Anxiety state, unspecified   . Depressive disorder, not elsewhere classified   . Legal blindness, as defined in [place]   . Reflux esophagitis   . Unspecified constipation   . Pain in joint, site unspecified   . Memory loss   . Palpitations   . Type II diabetes mellitus with neurological manifestations, uncontrolled   . H/O hiatal hernia     Past Surgical History  Procedure Laterality Date  . Coronary angioplasty with stent placement    . Hernia repair    . Eye surgery      Social History:   reports that he quit smoking about 13 months ago. His smoking use included Cigarettes. He has a 50 pack-year smoking history. He has never used smokeless tobacco. He reports that he does not  drink alcohol or use illicit drugs.  Family History  Problem Relation Age of Onset  . Asthma Mother   . Diabetes Son   . Cancer Daughter   . Diabetes Son     Medications: Patient's Medications  New Prescriptions   No medications on file  Previous Medications   ACETAMINOPHEN (TYLENOL) 500 MG TABLET    Take 500 mg by mouth every 8 (eight) hours as needed for pain.    ALBUTEROL (PROVENTIL HFA;VENTOLIN HFA) 108 (90 BASE) MCG/ACT INHALER    Inhale 2 puffs into the lungs every 2 (two) hours as needed for wheezing or shortness of breath.   CHOLECALCIFEROL (VITAMIN D) 2000 UNITS CAPS    Take 1 capsule by mouth daily.   CILOSTAZOL (PLETAL) 100 MG TABLET    Take 100 mg by mouth 2 (two) times daily.   ESOMEPRAZOLE (NEXIUM) 40 MG CAPSULE    Take 1 capsule (40 mg total) by mouth daily before breakfast.   ETODOLAC (LODINE) 400 MG TABLET    Take 400 mg by mouth 2 (two) times daily.   GARLIC PO    Take 75 mg by mouth. 2 by mouth daily   GLUCOSE BLOOD TEST STRIP    Check blood sugar 2 x daily   HYDROCODONE-ACETAMINOPHEN (NORCO) 10-325 MG PER TABLET    Take 1 tablet by mouth every 8 (eight) hours as needed for pain.    HYDROCORTISONE CREAM-NYSTATIN CREAM-ZINC OXIDE    Apply 1 application topically 2 (two) times daily.   LINAGLIPTIN (TRADJENTA) 5 MG TABS TABLET    Take 5 mg by mouth daily.   LISINOPRIL (PRINIVIL,ZESTRIL) 10 MG TABLET    Take 1 tablet (10 mg total) by mouth daily. FOR BLOOD PRESSURE   METFORMIN (GLUCOPHAGE) 500 MG TABLET    Take 1 tablet (500 mg total) by mouth 2 (two) times daily with a meal.   METOPROLOL SUCCINATE (TOPROL-XL) 50 MG 24 HR TABLET    Take 50 mg by mouth daily.    MULTIPLE VITAMIN (MULITIVITAMIN WITH MINERALS) TABS    Take 1 tablet by mouth daily.   NITROGLYCERIN (NITRODUR - DOSED IN MG/24 HR) 0.4 MG/HR    Place 1 patch onto the skin daily.   OMEGA 3 1200 MG CAPS    Take 2 capsules by mouth 2 (two) times daily.   SERTRALINE (ZOLOFT) 25 MG TABLET    Take 1 tablet (25 mg  total) by mouth daily.  Modified Medications   No medications on file  Discontinued Medications   No medications on file     Physical Exam: Filed Vitals:   04/26/14 1318  BP: 168/88  Pulse: 65  Temp: 98 F (36.7 C)  TempSrc:  Oral  Resp: 14  Weight: 208 lb (94.348 kg)  Physical Exam  Constitutional: He is oriented to person, place, and time. He appears well-developed and well-nourished. No distress.  HENT:  Head: Normocephalic and atraumatic.  Cardiovascular: Normal rate, regular rhythm and normal heart sounds.   Occasional pvc  Pulmonary/Chest: Effort normal and breath sounds normal. No respiratory distress.  Abdominal: Soft. Bowel sounds are normal. He exhibits no distension and no mass. There is no tenderness.  Musculoskeletal: Normal range of motion. He exhibits no edema and no tenderness.  Neurological: He is alert and oriented to person, place, and time.  Skin: Skin is warm and dry.  Psychiatric: He has a normal mood and affect.   Labs reviewed: Basic Metabolic Panel:  Recent Labs  49/67/59 0525  08/11/13 2004 12/18/13 1123 01/20/14 1512  NA 137  --  134* 136 141  K 4.1  --  3.7 4.5 3.8  CL 100  --  97 99 101  CO2 25  --  24 22  --   GLUCOSE 297*  < > 390* 141* 122*  BUN 11  --  15 27 12   CREATININE 0.98  --  1.15 1.60* 1.10  CALCIUM 9.8  --  9.5 10.0  --   < > = values in this interval not displayed. Liver Function Tests:  Recent Labs  08/07/13 0525 12/18/13 1123 01/20/14 1604  AST 47* 23 26  ALT 22 9 13   ALKPHOS 108 77 90  BILITOT 0.6 0.4 0.4  PROT 7.7 7.1 8.0  ALBUMIN 3.6  --  3.8  CBC:  Recent Labs  06/08/13 1306 08/06/13 1128 08/07/13 0525 08/11/13 2004 12/18/13 1123 01/20/14 1512  WBC 6.2 5.8 6.9 8.4 6.3  --   NEUTROABS 3.4  --   --  5.8 3.1  --   HGB 12.2* 11.0* 12.2* 11.8* 12.1* 15.0  HCT 39.1 32.9* 37.9* 35.8* 38.4 44.0  MCV 90 84.4 84.0 83.6 89  --   PLT  --  151 156 203  --   --    Lab Results  Component Value Date    HGBA1C 7.9* 12/18/2013    Assessment/Plan 1. Type II diabetes mellitus with neurological manifestations, uncontrolled - glucose readings sound acceptable that he tells me about but did not bring glucometer with him, f/u hba1c -diabetic foot exam surprisingly good except onychmycosis - CBC With differential/Platelet - Hemoglobin A1c - Comprehensive metabolic panel  2. Type II or unspecified type diabetes mellitus with peripheral circulatory disorders, uncontrolled(250.72) -as in #1, does have intact distal pulses - CBC With differential/Platelet - Hemoglobin A1c - Comprehensive metabolic panel  3. Depressive disorder, not elsewhere classified - mood well controlled with zoloft  4. Spinal stenosis, lumbar region, with neurogenic claudication - no pain in back itself, but in SI joints, down buttocks and legs, better recently  5. Tobacco abuse -is cutting back again after getting back up to 5 per day -says he will quit by the next time he sees me again b/c he is wheezing more again  6. Coronary atherosclerosis of native coronary artery -stable, no recent chest pains, cont secondary prevention regimen - CBC With differential/Platelet - Hemoglobin A1c - Comprehensive metabolic panel  Labs/tests ordered: Orders Placed This Encounter  Procedures  . CBC With differential/Platelet  . Hemoglobin A1c  . Comprehensive metabolic panel    Order Specific Question:  Has the patient fasted?    Answer:  Yes    Next appt:  3 mos

## 2014-04-27 ENCOUNTER — Other Ambulatory Visit: Payer: Self-pay | Admitting: Internal Medicine

## 2014-04-27 LAB — COMPREHENSIVE METABOLIC PANEL
ALT: 8 IU/L (ref 0–44)
AST: 19 IU/L (ref 0–40)
Albumin/Globulin Ratio: 1.4 (ref 1.1–2.5)
Albumin: 4.3 g/dL (ref 3.5–4.7)
Alkaline Phosphatase: 97 IU/L (ref 39–117)
BUN/Creatinine Ratio: 12 (ref 10–22)
BUN: 13 mg/dL (ref 8–27)
CO2: 22 mmol/L (ref 18–29)
Calcium: 10 mg/dL (ref 8.6–10.2)
Chloride: 102 mmol/L (ref 97–108)
Creatinine, Ser: 1.1 mg/dL (ref 0.76–1.27)
GFR calc Af Amer: 73 mL/min/{1.73_m2} (ref >59)
GFR calc non Af Amer: 63 mL/min/{1.73_m2} (ref >59)
Globulin, Total: 3 g/dL (ref 1.5–4.5)
Glucose: 147 mg/dL — ABNORMAL HIGH (ref 65–99)
Potassium: 4.4 mmol/L (ref 3.5–5.2)
Sodium: 140 mmol/L (ref 134–144)
Total Bilirubin: 0.4 mg/dL (ref 0.0–1.2)
Total Protein: 7.3 g/dL (ref 6.0–8.5)

## 2014-04-27 LAB — CBC WITH DIFFERENTIAL
Basophils Absolute: 0 10*3/uL (ref 0.0–0.2)
Basos: 0 %
Eos: 3 %
Eosinophils Absolute: 0.2 10*3/uL (ref 0.0–0.4)
HCT: 42.5 % (ref 37.5–51.0)
Hemoglobin: 14 g/dL (ref 12.6–17.7)
Immature Grans (Abs): 0 10*3/uL (ref 0.0–0.1)
Immature Granulocytes: 0 %
Lymphocytes Absolute: 2 10*3/uL (ref 0.7–3.1)
Lymphs: 28 %
MCH: 30 pg (ref 26.6–33.0)
MCHC: 32.9 g/dL (ref 31.5–35.7)
MCV: 91 fL (ref 79–97)
Monocytes Absolute: 0.6 10*3/uL (ref 0.1–0.9)
Monocytes: 8 %
Neutrophils Absolute: 4.2 10*3/uL (ref 1.4–7.0)
Neutrophils Relative %: 61 %
Platelets: 197 10*3/uL (ref 150–379)
RBC: 4.67 x10E6/uL (ref 4.14–5.80)
RDW: 13.5 % (ref 12.3–15.4)
WBC: 7 10*3/uL (ref 3.4–10.8)

## 2014-04-27 LAB — HEMOGLOBIN A1C
Est. average glucose Bld gHb Est-mCnc: 154 mg/dL
Hgb A1c MFr Bld: 7 % — ABNORMAL HIGH (ref 4.8–5.6)

## 2014-06-07 ENCOUNTER — Other Ambulatory Visit (HOSPITAL_COMMUNITY): Payer: Self-pay | Admitting: Cardiology

## 2014-06-07 DIAGNOSIS — R079 Chest pain, unspecified: Secondary | ICD-10-CM

## 2014-06-18 ENCOUNTER — Encounter (HOSPITAL_COMMUNITY)
Admission: RE | Admit: 2014-06-18 | Discharge: 2014-06-18 | Disposition: A | Discharge status: Home / Self Care | Payer: Medicare Other | Source: Ambulatory Visit | Attending: Cardiology | Admitting: Cardiology

## 2014-06-18 ENCOUNTER — Ambulatory Visit (HOSPITAL_COMMUNITY)
Admission: RE | Admit: 2014-06-18 | Discharge: 2014-06-18 | Disposition: A | Discharge status: Home / Self Care | Payer: Medicare Other | Source: Ambulatory Visit | Attending: Cardiology | Admitting: Cardiology

## 2014-06-18 DIAGNOSIS — R079 Chest pain, unspecified: Secondary | ICD-10-CM | POA: Insufficient documentation

## 2014-06-18 MED ORDER — REGADENOSON 0.4 MG/5ML IV SOLN
INTRAVENOUS | Status: AC
  Filled 2014-06-18: qty 5

## 2014-06-18 MED ORDER — TECHNETIUM TC 99M SESTAMIBI - CARDIOLITE
30.0000 | Freq: Once | INTRAVENOUS | Status: AC | PRN
  Administered 2014-06-18: 10:00:00 30 via INTRAVENOUS

## 2014-06-18 MED ORDER — TECHNETIUM TC 99M SESTAMIBI GENERIC - CARDIOLITE
30.0000 | Freq: Once | INTRAVENOUS | Status: AC | PRN
  Administered 2014-06-18: 30 via INTRAVENOUS

## 2014-06-18 MED ORDER — REGADENOSON 0.4 MG/5ML IV SOLN
0.4000 mg | Freq: Once | INTRAVENOUS | Status: AC
  Administered 2014-06-18: 0.4 mg via INTRAVENOUS

## 2014-06-18 MED ORDER — TECHNETIUM TC 99M SESTAMIBI GENERIC - CARDIOLITE
10.0000 | Freq: Once | INTRAVENOUS | Status: AC | PRN
  Administered 2014-06-18: 10 via INTRAVENOUS

## 2014-07-24 ENCOUNTER — Other Ambulatory Visit: Payer: Self-pay | Admitting: *Deleted

## 2014-07-24 MED ORDER — LISINOPRIL 10 MG PO TABS: 10.0000 mg | ORAL_TABLET | Freq: Every day | ORAL | Status: DC

## 2014-07-24 NOTE — Telephone Encounter (Signed)
[facility] 

## 2014-07-26 ENCOUNTER — Encounter: Payer: Self-pay | Admitting: Internal Medicine

## 2014-07-26 ENCOUNTER — Ambulatory Visit (INDEPENDENT_AMBULATORY_CARE_PROVIDER_SITE_OTHER): Payer: Medicare Other | Admitting: Internal Medicine

## 2014-07-26 VITALS — BP 142/86 | HR 77 | Temp 98.3°F | Ht 66.0 in | Wt 209.2 lb

## 2014-07-26 DIAGNOSIS — E1149 Type 2 diabetes mellitus with other diabetic neurological complication: Secondary | ICD-10-CM

## 2014-07-26 DIAGNOSIS — E1142 Type 2 diabetes mellitus with diabetic polyneuropathy: Secondary | ICD-10-CM

## 2014-07-26 DIAGNOSIS — I25119 Atherosclerotic heart disease of native coronary artery with unspecified angina pectoris: Secondary | ICD-10-CM

## 2014-07-26 DIAGNOSIS — E1159 Type 2 diabetes mellitus with other circulatory complications: Secondary | ICD-10-CM

## 2014-07-26 DIAGNOSIS — J4489 Other specified chronic obstructive pulmonary disease: Secondary | ICD-10-CM

## 2014-07-26 DIAGNOSIS — I2510 Atherosclerotic heart disease of native coronary artery without angina pectoris: Secondary | ICD-10-CM

## 2014-07-26 DIAGNOSIS — H543 Unqualified visual loss, both eyes: Secondary | ICD-10-CM

## 2014-07-26 DIAGNOSIS — J449 Chronic obstructive pulmonary disease, unspecified: Secondary | ICD-10-CM

## 2014-07-26 DIAGNOSIS — F3289 Other specified depressive episodes: Secondary | ICD-10-CM

## 2014-07-26 DIAGNOSIS — I209 Angina pectoris, unspecified: Secondary | ICD-10-CM

## 2014-07-26 DIAGNOSIS — E1165 Type 2 diabetes mellitus with hyperglycemia: Secondary | ICD-10-CM

## 2014-07-26 DIAGNOSIS — H547 Unspecified visual loss: Secondary | ICD-10-CM | POA: Insufficient documentation

## 2014-07-26 DIAGNOSIS — IMO0002 Reserved for concepts with insufficient information to code with codable children: Secondary | ICD-10-CM

## 2014-07-26 DIAGNOSIS — M48062 Spinal stenosis, lumbar region with neurogenic claudication: Secondary | ICD-10-CM

## 2014-07-26 DIAGNOSIS — IMO0001 Reserved for inherently not codable concepts without codable children: Secondary | ICD-10-CM

## 2014-07-26 DIAGNOSIS — F329 Major depressive disorder, single episode, unspecified: Secondary | ICD-10-CM

## 2014-07-26 MED ORDER — METFORMIN HCL 500 MG PO TABS: 500.0000 mg | ORAL_TABLET | Freq: Two times a day (BID) | ORAL | Status: DC

## 2014-07-26 MED ORDER — HYDROCODONE-ACETAMINOPHEN 10-325 MG PO TABS: 1.0000 | ORAL_TABLET | Freq: Three times a day (TID) | ORAL | Status: DC | PRN

## 2014-07-26 MED ORDER — ZOSTER VACCINE LIVE 19400 UNT/0.65ML ~~LOC~~ SOLR: 0.6500 mL | Freq: Once | SUBCUTANEOUS | Status: DC

## 2014-07-26 MED ORDER — ALBUTEROL SULFATE HFA 108 (90 BASE) MCG/ACT IN AERS: 2.0000 | INHALATION_SPRAY | RESPIRATORY_TRACT | Status: DC | PRN

## 2014-07-26 MED ORDER — SERTRALINE HCL 50 MG PO TABS: 50.0000 mg | ORAL_TABLET | Freq: Every day | ORAL | Status: DC

## 2014-07-26 MED ORDER — LINAGLIPTIN 5 MG PO TABS: 5.0000 mg | ORAL_TABLET | Freq: Every day | ORAL | Status: DC

## 2014-07-26 MED ORDER — HYDROCODONE-ACETAMINOPHEN 10-325 MG PO TABS
1.0000 | ORAL_TABLET | Freq: Three times a day (TID) | ORAL | Status: DC | PRN
Start: 2014-07-26 — End: 2014-08-19

## 2014-07-26 NOTE — Progress Notes (Signed)
Patient ID: Eddie Graves, male   DOB: 08/01/1931, 80 y.o.   MRN: [id number]   Location:  [facility]  Code Status: full code  Allergies  Allergen Reactions  . Flexeril [Cyclobenzaprine]     delirium    Chief Complaint  Patient presents with  . Medical Management of Chronic Issues    3 month f/u & discuss Genetic Test Results  . other    still having soreness around the waistline, not sleeping @ night, and  having no feeling in both feet at times ( can't tell if he has socks on or not)  . other    fall screening Neg, wants RX for Diabetic Shoes,  . Immunizations    shingles vaccine printed.    HPI: Patient is a 80 y.o. black male seen in the office today for medical mgt of his chronic diseases.  He is blind.    He still c/o soreness in his lower back.  He says this is improved.  Bought some pillows to make him higher up and that helps some.    Feet are numb.  Can't tell if he is wearing his socks.  Needs diabetic shoes.  Prescription written.  He plans to get them from [facility].    Not sleeping again now.  Doesn't know what has affected his sleeping. Goes to be at 10pm and still awake at 12-1am. Sometimes naps, but does not note correlation with his sleep at night.  Not due to pain.  Might be stressors keeping him up.  Has been out of zoloft for a long time.  Was sleeping well when he took that and for a while after he completed the month's supply (never refilled).  Went for stress test 06/18/14 since last visit.  Has not been back since test done.  I reviewed this today and it was normal.  Thinks his appt with Dr. Sharyn Lull is in September.    Still wheezing and using his inhalers, but ran out again of his albuterol and did not refill.  Reviewed again with pt and son that all his meds are long term and can be refilled.  Review of Systems:  Review of Systems  Constitutional: Negative for fever and chills.  Eyes:       Blind    Respiratory: Positive for shortness of breath.        Had gone up on smoking, but has gone back down again now  Cardiovascular: Negative for chest pain and leg swelling.  Gastrointestinal: Negative for abdominal pain and constipation.  Genitourinary: Negative for dysuria, urgency and frequency.  Musculoskeletal: Negative for falls and myalgias.  Neurological: Negative for dizziness, loss of consciousness and headaches.  Psychiatric/Behavioral: The patient has insomnia.     Past Medical History  Diagnosis Date  . DM (diabetes mellitus) type II controlled peripheral vascular disorder   . Essential hypertension, benign   . Blind   . Headache(784.0)   . Sebaceous cyst   . Chronic airway obstruction, not elsewhere classified   . Cough   . Spinal stenosis, lumbar region, with neurogenic claudication   . Coronary atherosclerosis of native coronary artery   . Vitamin D deficiency   . Rash and other nonspecific skin eruption   . Other and unspecified hyperlipidemia   . Anxiety state, unspecified   . Depressive disorder, not elsewhere classified   . Legal blindness, as defined in [place]   . Reflux esophagitis   . Unspecified  constipation   . Pain in joint, site unspecified   . Memory loss   . Palpitations   . Type II diabetes mellitus with neurological manifestations, uncontrolled   . H/O hiatal hernia     Past Surgical History  Procedure Laterality Date  . Coronary angioplasty with stent placement    . Hernia repair    . Eye surgery      Social History:   reports that he quit smoking about 16 months ago. His smoking use included Cigarettes. He has a 50 pack-year smoking history. He has never used smokeless tobacco. He reports that he does not drink alcohol or use illicit drugs.  Family History  Problem Relation Age of Onset  . Asthma Mother   . Diabetes Son   . Cancer Daughter   . Diabetes Son     Medications: Patient's Medications  New Prescriptions   No medications on  file  Previous Medications   ACETAMINOPHEN (TYLENOL) 500 MG TABLET    Take 500 mg by mouth every 8 (eight) hours as needed for pain.    ALBUTEROL (PROVENTIL HFA;VENTOLIN HFA) 108 (90 BASE) MCG/ACT INHALER    Inhale 2 puffs into the lungs every 2 (two) hours as needed for wheezing or shortness of breath.   CHOLECALCIFEROL (VITAMIN D) 2000 UNITS CAPS    Take 1 capsule by mouth daily.   ETODOLAC (LODINE) 400 MG TABLET    Take 400 mg by mouth 2 (two) times daily.   GARLIC PO    Take 75 mg by mouth. 2 by mouth daily   GLUCOSE BLOOD TEST STRIP    Check blood sugar 2 x daily   HYDROCODONE-ACETAMINOPHEN (NORCO) 10-325 MG PER TABLET    Take 1 tablet by mouth every 8 (eight) hours as needed for pain.    HYDROCORTISONE CREAM-NYSTATIN CREAM-ZINC OXIDE    Apply 1 application topically 2 (two) times daily.   LINAGLIPTIN (TRADJENTA) 5 MG TABS TABLET    Take 5 mg by mouth daily.   LISINOPRIL (PRINIVIL,ZESTRIL) 10 MG TABLET    Take 1 tablet (10 mg total) by mouth daily. FOR BLOOD PRESSURE   METFORMIN (GLUCOPHAGE) 500 MG TABLET    Take 1 tablet (500 mg total) by mouth 2 (two) times daily with a meal.   METOPROLOL SUCCINATE (TOPROL-XL) 50 MG 24 HR TABLET    Take 50 mg by mouth daily.    MULTIPLE VITAMIN (MULITIVITAMIN WITH MINERALS) TABS    Take 1 tablet by mouth daily.   NEXIUM 40 MG CAPSULE    take 1 capsule by mouth every morning BEFORE BREAKFAST   NITROGLYCERIN (NITRODUR - DOSED IN MG/24 HR) 0.4 MG/HR    Place 1 patch onto the skin daily.   OMEGA 3 1200 MG CAPS    Take 2 capsules by mouth 2 (two) times daily.   SERTRALINE (ZOLOFT) 25 MG TABLET    Take 1 tablet (25 mg total) by mouth daily.  Modified Medications   Modified Medication Previous Medication   ZOSTER VACCINE LIVE, PF, (ZOSTAVAX) 13244 UNT/0.65ML INJECTION zoster vaccine live, PF, (ZOSTAVAX) 01027 UNT/0.65ML injection      Inject 19,400 Units into the skin once.    Inject 0.65 mLs into the skin once.  Discontinued Medications   CILOSTAZOL (PLETAL)  100 MG TABLET    Take 100 mg by mouth 2 (two) times daily.     Physical Exam: Filed Vitals:   07/26/14 0850  BP: 142/86  Pulse: 77  Temp: 98.3 F (36.8 C)  TempSrc: Oral  Height: 5\' 6"  (1.676 m)  Weight: 209 lb 3.2 oz (94.892 kg)  SpO2: 97%  Physical Exam  Constitutional: He is oriented to person, place, and time. He appears well-developed and well-nourished. No distress.  HENT:  Head: Normocephalic and atraumatic.  Cardiovascular: Normal rate, regular rhythm, normal heart sounds and intact distal pulses.   Pulmonary/Chest: Effort normal. He has wheezes.  Abdominal: Soft. Bowel sounds are normal. He exhibits no distension and no mass. There is no tenderness.  Musculoskeletal: Normal range of motion. He exhibits tenderness.  Paravertebral muscles of lumbar spine;  NOTE FOOT EXAM DONE 5/15 visit  Neurological: He is alert and oriented to person, place, and time.  Skin: Skin is warm and dry.  Psychiatric: He has a normal mood and affect.    Labs reviewed: Basic Metabolic Panel:  Recent Labs  6/15 2004 12/18/13 1123 01/20/14 1512 04/26/14 1408  NA 134* 136 141 140  K 3.7 4.5 3.8 4.4  CL 97 99 101 102  CO2 24 22  --  22  GLUCOSE 390* 141* 122* 147*  BUN 15 27 12 13   CREATININE 1.15 1.60* 1.10 1.10  CALCIUM 9.5 10.0  --  10.0   Liver Function Tests:  Recent Labs  08/07/13 0525 12/18/13 1123 01/20/14 1604 04/26/14 1408  AST 47* 23 26 19   ALT 22 9 13 8   ALKPHOS 108 77 90 97  BILITOT 0.6 0.4 0.4 0.4  PROT 7.7 7.1 8.0 7.3  ALBUMIN 3.6  --  3.8  --    No results found for this basename: LIPASE, AMYLASE,  in the last 8760 hours No results found for this basename: AMMONIA,  in the last 8760 hours CBC:  Recent Labs  08/07/13 0525 08/11/13 2004 12/18/13 1123 01/20/14 1512 04/26/14 1408  WBC 6.9 8.4 6.3  --  7.0  NEUTROABS  --  5.8 3.1  --  4.2  HGB 12.2* 11.8* 12.1* 15.0 14.0  HCT 37.9* 35.8* 38.4 44.0 42.5  MCV 84.0 83.6 89  --  91  PLT 156 203  --    --  197   Lipid Panel: No results found for this basename: CHOL, HDL, LDLCALC, TRIG, CHOLHDL, LDLDIRECT,  in the last 8760 hours Lab Results  Component Value Date   HGBA1C 7.0* 04/26/2014    Past Procedures: Stress test 7/15 Dr. 01/22/14 normal  Assessment/Plan 1. Type II or unspecified type diabetes mellitus with peripheral circulatory disorders, uncontrolled(250.72) -cont current regimen and check hba1c -RX for diabetic shoes provided - linagliptin (TRADJENTA) 5 MG TABS tablet; Take 1 tablet (5 mg total) by mouth daily.  Dispense: 30 tablet; Refill: 3 - metFORMIN (GLUCOPHAGE) 500 MG tablet; Take 1 tablet (500 mg total) by mouth 2 (two) times daily with a meal.  Dispense: 60 tablet; Refill: 3  2. Type II diabetes mellitus with neurological manifestations, uncontrolled -cont current regimen, check hba1c, on ace and statin - linagliptin (TRADJENTA) 5 MG TABS tablet; Take 1 tablet (5 mg total) by mouth daily.  Dispense: 30 tablet; Refill: 3 - metFORMIN (GLUCOPHAGE) 500 MG tablet; Take 1 tablet (500 mg total) by mouth 2 (two) times daily with a meal.  Dispense: 60 tablet; Refill: 3  3. Spinal stenosis, lumbar region, with neurogenic claudication -cont current regimen, pillows are offering support and helping his back - HYDROcodone-acetaminophen (NORCO) 10-325 MG per tablet; Take 1 tablet by mouth every 8 (eight) hours as needed.  Dispense: 60 tablet; Refill: 0  4. COPD bronchitis -renew albuterol  and reinforced that these can be regularly refilled.  - albuterol (PROVENTIL HFA;VENTOLIN HFA) 108 (90 BASE) MCG/ACT inhaler; Inhale 2 puffs into the lungs every 2 (two) hours as needed for wheezing or shortness of breath.  Dispense: 18 g; Refill: 3  5. Diabetic peripheral neuropathy associated with type 2 diabetes mellitus - diabetic shoe prescription provided - HYDROcodone-acetaminophen (NORCO) 10-325 MG per tablet; Take 1 tablet by mouth every 8 (eight) hours as needed.  Dispense: 60  tablet; Refill: 0 - linagliptin (TRADJENTA) 5 MG TABS tablet; Take 1 tablet (5 mg total) by mouth daily.  Dispense: 30 tablet; Refill: 3 - metFORMIN (GLUCOPHAGE) 500 MG tablet; Take 1 tablet (500 mg total) by mouth 2 (two) times daily with a meal.  Dispense: 60 tablet; Refill: 3  6. Atherosclerosis of native coronary artery of native heart with angina pectoris -just had stress test last month with Dr. Sharyn Lull -was normal -cont secondary prevention regimen with aggressive med mgt -bp was high today b/c of not taking meds this am before appt (fasting for labs)--reminded he can still take meds with water or black coffee  7. Depressive disorder, not elsewhere classified -interfering with sleep--somehow never refilled his zoloft -will restart at 50mg  as tolerated 25mg  pill well with sleep and mood benefit - sertraline (ZOLOFT) 50 MG tablet; Take 1 tablet (50 mg total) by mouth daily.  Dispense: 30 tablet; Refill: 3  8. Blind -should still see ophtho annually unless ophtho advises otherwise - Ambulatory referral to Ophthalmology  Labs/tests ordered:   Orders Placed This Encounter  Procedures  . CBC With differential/Platelet  . Comprehensive metabolic panel  . Hemoglobin A1c  . Lipid panel  . Ambulatory referral to Ophthalmology    Referral Priority:  Routine    Referral Type:  Consultation    Referral Reason:  Specialty Services Required    Requested Specialty:  Ophthalmology    Number of Visits Requested:  1    Next appt:  3 mos for annual exam

## 2014-07-27 ENCOUNTER — Other Ambulatory Visit: Payer: Self-pay | Admitting: *Deleted

## 2014-07-27 LAB — LIPID PANEL
Chol/HDL Ratio: 3.5 ratio units (ref 0.0–5.0)
Cholesterol, Total: 152 mg/dL (ref 100–199)
HDL: 43 mg/dL (ref >39)
LDL Calculated: 85 mg/dL (ref 0–99)
Triglycerides: 120 mg/dL (ref 0–149)
VLDL Cholesterol Cal: 24 mg/dL (ref 5–40)

## 2014-07-27 LAB — CBC WITH DIFFERENTIAL
Basophils Absolute: 0 10*3/uL (ref 0.0–0.2)
Basos: 1 %
Eos: 5 %
Eosinophils Absolute: 0.3 10*3/uL (ref 0.0–0.4)
HCT: 41.6 % (ref 37.5–51.0)
Hemoglobin: 13.9 g/dL (ref 12.6–17.7)
Immature Grans (Abs): 0 10*3/uL (ref 0.0–0.1)
Immature Granulocytes: 0 %
Lymphocytes Absolute: 2 10*3/uL (ref 0.7–3.1)
Lymphs: 31 %
MCH: 29.9 pg (ref 26.6–33.0)
MCHC: 33.4 g/dL (ref 31.5–35.7)
MCV: 90 fL (ref 79–97)
Monocytes Absolute: 0.9 10*3/uL (ref 0.1–0.9)
Monocytes: 14 %
Neutrophils Absolute: 3.2 10*3/uL (ref 1.4–7.0)
Neutrophils Relative %: 49 %
Platelets: 185 10*3/uL (ref 150–379)
RBC: 4.65 x10E6/uL (ref 4.14–5.80)
RDW: 13.8 % (ref 12.3–15.4)
WBC: 6.4 10*3/uL (ref 3.4–10.8)

## 2014-07-27 LAB — COMPREHENSIVE METABOLIC PANEL
ALT: 17 IU/L (ref 0–44)
AST: 26 IU/L (ref 0–40)
Albumin/Globulin Ratio: 1.5 (ref 1.1–2.5)
Albumin: 4.4 g/dL (ref 3.5–4.7)
Alkaline Phosphatase: 108 IU/L (ref 39–117)
BUN/Creatinine Ratio: 12 (ref 10–22)
BUN: 14 mg/dL (ref 8–27)
CO2: 25 mmol/L (ref 18–29)
Calcium: 9.6 mg/dL (ref 8.6–10.2)
Chloride: 98 mmol/L (ref 97–108)
Creatinine, Ser: 1.14 mg/dL (ref 0.76–1.27)
GFR calc Af Amer: 69 mL/min/{1.73_m2} (ref >59)
GFR calc non Af Amer: 60 mL/min/{1.73_m2} (ref >59)
Globulin, Total: 3 g/dL (ref 1.5–4.5)
Glucose: 180 mg/dL — ABNORMAL HIGH (ref 65–99)
Potassium: 4.6 mmol/L (ref 3.5–5.2)
Sodium: 138 mmol/L (ref 134–144)
Total Bilirubin: 0.4 mg/dL (ref 0.0–1.2)
Total Protein: 7.4 g/dL (ref 6.0–8.5)

## 2014-07-27 LAB — HEMOGLOBIN A1C
Est. average glucose Bld gHb Est-mCnc: 200 mg/dL
Hgb A1c MFr Bld: 8.6 % — ABNORMAL HIGH (ref 4.8–5.6)

## 2014-07-27 MED ORDER — METFORMIN HCL 500 MG PO TABS: 1000.0000 mg | ORAL_TABLET | Freq: Two times a day (BID) | ORAL | Status: DC

## 2014-07-27 NOTE — Telephone Encounter (Signed)
Pt notified VIA phone regarding results/recomendations.

## 2014-08-19 ENCOUNTER — Inpatient Hospital Stay (HOSPITAL_COMMUNITY)
Admission: EM | Admit: 2014-08-19 | Discharge: 2014-08-20 | DRG: 101 | Disposition: A | Discharge status: Home / Self Care | Payer: Medicare Other | Attending: Internal Medicine | Admitting: Internal Medicine

## 2014-08-19 ENCOUNTER — Encounter (HOSPITAL_COMMUNITY): Payer: Self-pay | Admitting: Emergency Medicine

## 2014-08-19 ENCOUNTER — Emergency Department (HOSPITAL_COMMUNITY): Payer: Medicare Other

## 2014-08-19 ENCOUNTER — Inpatient Hospital Stay (HOSPITAL_COMMUNITY): Payer: Medicare Other

## 2014-08-19 DIAGNOSIS — R569 Unspecified convulsions: Principal | ICD-10-CM

## 2014-08-19 DIAGNOSIS — Z79899 Other long term (current) drug therapy: Secondary | ICD-10-CM

## 2014-08-19 DIAGNOSIS — F3289 Other specified depressive episodes: Secondary | ICD-10-CM | POA: Diagnosis present

## 2014-08-19 DIAGNOSIS — H547 Unspecified visual loss: Secondary | ICD-10-CM

## 2014-08-19 DIAGNOSIS — IMO0001 Reserved for inherently not codable concepts without codable children: Secondary | ICD-10-CM

## 2014-08-19 DIAGNOSIS — E785 Hyperlipidemia, unspecified: Secondary | ICD-10-CM

## 2014-08-19 DIAGNOSIS — F329 Major depressive disorder, single episode, unspecified: Secondary | ICD-10-CM

## 2014-08-19 DIAGNOSIS — R413 Other amnesia: Secondary | ICD-10-CM

## 2014-08-19 DIAGNOSIS — I609 Nontraumatic subarachnoid hemorrhage, unspecified: Secondary | ICD-10-CM

## 2014-08-19 DIAGNOSIS — E669 Obesity, unspecified: Secondary | ICD-10-CM

## 2014-08-19 DIAGNOSIS — E1151 Type 2 diabetes mellitus with diabetic peripheral angiopathy without gangrene: Secondary | ICD-10-CM

## 2014-08-19 DIAGNOSIS — J441 Chronic obstructive pulmonary disease with (acute) exacerbation: Secondary | ICD-10-CM | POA: Diagnosis present

## 2014-08-19 DIAGNOSIS — M48062 Spinal stenosis, lumbar region with neurogenic claudication: Secondary | ICD-10-CM | POA: Diagnosis present

## 2014-08-19 DIAGNOSIS — E1159 Type 2 diabetes mellitus with other circulatory complications: Secondary | ICD-10-CM | POA: Diagnosis present

## 2014-08-19 DIAGNOSIS — I10 Essential (primary) hypertension: Secondary | ICD-10-CM | POA: Diagnosis present

## 2014-08-19 DIAGNOSIS — I25119 Atherosclerotic heart disease of native coronary artery with unspecified angina pectoris: Secondary | ICD-10-CM

## 2014-08-19 DIAGNOSIS — Z87891 Personal history of nicotine dependence: Secondary | ICD-10-CM | POA: Diagnosis not present

## 2014-08-19 DIAGNOSIS — G40909 Epilepsy, unspecified, not intractable, without status epilepticus: Secondary | ICD-10-CM | POA: Diagnosis present

## 2014-08-19 DIAGNOSIS — E1149 Type 2 diabetes mellitus with other diabetic neurological complication: Secondary | ICD-10-CM

## 2014-08-19 DIAGNOSIS — F411 Generalized anxiety disorder: Secondary | ICD-10-CM | POA: Diagnosis present

## 2014-08-19 DIAGNOSIS — Z9861 Coronary angioplasty status: Secondary | ICD-10-CM | POA: Diagnosis not present

## 2014-08-19 DIAGNOSIS — E559 Vitamin D deficiency, unspecified: Secondary | ICD-10-CM

## 2014-08-19 DIAGNOSIS — N289 Disorder of kidney and ureter, unspecified: Secondary | ICD-10-CM

## 2014-08-19 DIAGNOSIS — H548 Legal blindness, as defined in USA: Secondary | ICD-10-CM | POA: Diagnosis present

## 2014-08-19 DIAGNOSIS — I798 Other disorders of arteries, arterioles and capillaries in diseases classified elsewhere: Secondary | ICD-10-CM | POA: Diagnosis present

## 2014-08-19 DIAGNOSIS — I2510 Atherosclerotic heart disease of native coronary artery without angina pectoris: Secondary | ICD-10-CM | POA: Diagnosis present

## 2014-08-19 DIAGNOSIS — E1165 Type 2 diabetes mellitus with hyperglycemia: Secondary | ICD-10-CM

## 2014-08-19 DIAGNOSIS — R259 Unspecified abnormal involuntary movements: Secondary | ICD-10-CM | POA: Diagnosis present

## 2014-08-19 DIAGNOSIS — Q619 Cystic kidney disease, unspecified: Secondary | ICD-10-CM | POA: Diagnosis not present

## 2014-08-19 DIAGNOSIS — E1142 Type 2 diabetes mellitus with diabetic polyneuropathy: Secondary | ICD-10-CM

## 2014-08-19 DIAGNOSIS — Z720 Tobacco use: Secondary | ICD-10-CM

## 2014-08-19 DIAGNOSIS — IMO0002 Reserved for concepts with insufficient information to code with codable children: Secondary | ICD-10-CM

## 2014-08-19 LAB — HEPATIC FUNCTION PANEL
ALT: 17 U/L (ref 0–53)
AST: 23 U/L (ref 0–37)
Albumin: 3.2 g/dL — ABNORMAL LOW (ref 3.5–5.2)
Alkaline Phosphatase: 86 U/L (ref 39–117)
BILIRUBIN TOTAL: 0.4 mg/dL (ref 0.3–1.2)
Bilirubin, Direct: <0.2 mg/dL (ref 0.0–0.3)
Total Protein: 6.5 g/dL (ref 6.0–8.3)

## 2014-08-19 LAB — CBC WITH DIFFERENTIAL/PLATELET
BASOS PCT: 1 % (ref 0–1)
Basophils Absolute: 0 10*3/uL (ref 0.0–0.1)
EOS PCT: 4 % (ref 0–5)
Eosinophils Absolute: 0.3 10*3/uL (ref 0.0–0.7)
HEMATOCRIT: 37.2 % — AB (ref 39.0–52.0)
Hemoglobin: 12.6 g/dL — ABNORMAL LOW (ref 13.0–17.0)
LYMPHS PCT: 32 % (ref 12–46)
Lymphs Abs: 2.3 10*3/uL (ref 0.7–4.0)
MCH: 30.4 pg (ref 26.0–34.0)
MCHC: 33.9 g/dL (ref 30.0–36.0)
MCV: 89.6 fL (ref 78.0–100.0)
MONO ABS: 0.7 10*3/uL (ref 0.1–1.0)
Monocytes Relative: 10 % (ref 3–12)
Neutro Abs: 3.9 10*3/uL (ref 1.7–7.7)
Neutrophils Relative %: 53 % (ref 43–77)
Platelets: 193 10*3/uL (ref 150–400)
RBC: 4.15 MIL/uL — ABNORMAL LOW (ref 4.22–5.81)
RDW: 12.9 % (ref 11.5–15.5)
WBC: 7.3 10*3/uL (ref 4.0–10.5)

## 2014-08-19 LAB — CBC
HCT: 36.7 % — ABNORMAL LOW (ref 39.0–52.0)
HEMOGLOBIN: 12.2 g/dL — AB (ref 13.0–17.0)
MCH: 30.7 pg (ref 26.0–34.0)
MCHC: 33.2 g/dL (ref 30.0–36.0)
MCV: 92.4 fL (ref 78.0–100.0)
PLATELETS: 172 10*3/uL (ref 150–400)
RBC: 3.97 MIL/uL — AB (ref 4.22–5.81)
RDW: 13 % (ref 11.5–15.5)
WBC: 6 10*3/uL (ref 4.0–10.5)

## 2014-08-19 LAB — TSH: TSH: 0.728 u[IU]/mL (ref 0.350–4.500)

## 2014-08-19 LAB — COMPREHENSIVE METABOLIC PANEL
ALBUMIN: 3.4 g/dL — AB (ref 3.5–5.2)
ALK PHOS: 86 U/L (ref 39–117)
ALT: 19 U/L (ref 0–53)
AST: 32 U/L (ref 0–37)
Anion gap: 15 (ref 5–15)
BUN: 15 mg/dL (ref 6–23)
CHLORIDE: 101 meq/L (ref 96–112)
CO2: 24 mEq/L (ref 19–32)
Calcium: 9.3 mg/dL (ref 8.4–10.5)
Creatinine, Ser: 1.07 mg/dL (ref 0.50–1.35)
GFR calc Af Amer: 73 mL/min — ABNORMAL LOW (ref >90)
GFR calc non Af Amer: 63 mL/min — ABNORMAL LOW (ref >90)
Glucose, Bld: 215 mg/dL — ABNORMAL HIGH (ref 70–99)
POTASSIUM: 4.2 meq/L (ref 3.7–5.3)
Sodium: 140 mEq/L (ref 137–147)
Total Bilirubin: 0.5 mg/dL (ref 0.3–1.2)
Total Protein: 6.9 g/dL (ref 6.0–8.3)

## 2014-08-19 LAB — CREATININE, SERUM
CREATININE: 1.04 mg/dL (ref 0.50–1.35)
GFR calc Af Amer: 76 mL/min — ABNORMAL LOW (ref >90)
GFR, EST NON AFRICAN AMERICAN: 65 mL/min — AB (ref >90)

## 2014-08-19 LAB — CBG MONITORING, ED: Glucose-Capillary: 181 mg/dL — ABNORMAL HIGH (ref 70–99)

## 2014-08-19 LAB — TROPONIN I

## 2014-08-19 MED ORDER — ONDANSETRON HCL 4 MG PO TABS: 4.0000 mg | ORAL_TABLET | Freq: Four times a day (QID) | ORAL | Status: DC | PRN

## 2014-08-19 MED ORDER — SERTRALINE HCL 50 MG PO TABS
50.0000 mg | ORAL_TABLET | Freq: Every day | ORAL | Status: DC
  Administered 2014-08-20: 50 mg via ORAL
  Filled 2014-08-19: qty 1

## 2014-08-19 MED ORDER — ENOXAPARIN SODIUM 40 MG/0.4ML ~~LOC~~ SOLN
40.0000 mg | SUBCUTANEOUS | Status: DC
  Administered 2014-08-19: 40 mg via SUBCUTANEOUS
  Filled 2014-08-19: qty 0.4

## 2014-08-19 MED ORDER — LEVETIRACETAM IN NACL 1000 MG/100ML IV SOLN
1000.0000 mg | Freq: Once | INTRAVENOUS | Status: DC
  Filled 2014-08-19: qty 100

## 2014-08-19 MED ORDER — PANTOPRAZOLE SODIUM 40 MG PO TBEC
80.0000 mg | DELAYED_RELEASE_TABLET | Freq: Every day | ORAL | Status: DC
  Administered 2014-08-20: 80 mg via ORAL
  Filled 2014-08-19: qty 2

## 2014-08-19 MED ORDER — ONDANSETRON HCL 4 MG/2ML IJ SOLN: 4.0000 mg | Freq: Four times a day (QID) | INTRAMUSCULAR | Status: DC | PRN

## 2014-08-19 MED ORDER — ACETAMINOPHEN 325 MG PO TABS: 650.0000 mg | ORAL_TABLET | Freq: Four times a day (QID) | ORAL | Status: DC | PRN

## 2014-08-19 MED ORDER — AMLODIPINE BESYLATE 2.5 MG PO TABS
2.5000 mg | ORAL_TABLET | Freq: Every day | ORAL | Status: DC
  Administered 2014-08-20: 2.5 mg via ORAL
  Filled 2014-08-19: qty 1

## 2014-08-19 MED ORDER — LEVETIRACETAM IN NACL 1000 MG/100ML IV SOLN
1000.0000 mg | Freq: Once | INTRAVENOUS | Status: AC
  Administered 2014-08-19: 1000 mg via INTRAVENOUS
  Filled 2014-08-19: qty 100

## 2014-08-19 MED ORDER — GADOBENATE DIMEGLUMINE 529 MG/ML IV SOLN
20.0000 mL | Freq: Once | INTRAVENOUS | Status: AC | PRN
  Administered 2014-08-19: 20 mL via INTRAVENOUS

## 2014-08-19 MED ORDER — LISINOPRIL 10 MG PO TABS
10.0000 mg | ORAL_TABLET | Freq: Every day | ORAL | Status: DC
  Administered 2014-08-20: 10 mg via ORAL
  Filled 2014-08-19: qty 1

## 2014-08-19 MED ORDER — LEVETIRACETAM ER 500 MG PO TB24
1000.0000 mg | ORAL_TABLET | Freq: Every day | ORAL | Status: DC
  Administered 2014-08-20: 1000 mg via ORAL
  Filled 2014-08-19: qty 2

## 2014-08-19 MED ORDER — ALBUTEROL SULFATE HFA 108 (90 BASE) MCG/ACT IN AERS: 2.0000 | INHALATION_SPRAY | RESPIRATORY_TRACT | Status: DC | PRN

## 2014-08-19 MED ORDER — ESOMEPRAZOLE MAGNESIUM 40 MG PO PACK: 40.0000 mg | PACK | Freq: Every day | ORAL | Status: DC

## 2014-08-19 MED ORDER — METOPROLOL SUCCINATE ER 25 MG PO TB24
50.0000 mg | ORAL_TABLET | Freq: Every day | ORAL | Status: DC
  Administered 2014-08-20: 50 mg via ORAL
  Filled 2014-08-19: qty 2

## 2014-08-19 MED ORDER — ALBUTEROL SULFATE (2.5 MG/3ML) 0.083% IN NEBU: 2.5000 mg | INHALATION_SOLUTION | RESPIRATORY_TRACT | Status: DC | PRN

## 2014-08-19 MED ORDER — NICOTINE 14 MG/24HR TD PT24
14.0000 mg | MEDICATED_PATCH | Freq: Every day | TRANSDERMAL | Status: DC
  Administered 2014-08-19 – 2014-08-20 (×2): 14 mg via TRANSDERMAL
  Filled 2014-08-19 (×2): qty 1

## 2014-08-19 MED ORDER — ACETAMINOPHEN 650 MG RE SUPP: 650.0000 mg | Freq: Four times a day (QID) | RECTAL | Status: DC | PRN

## 2014-08-19 MED ORDER — LINAGLIPTIN 5 MG PO TABS
5.0000 mg | ORAL_TABLET | Freq: Every day | ORAL | Status: DC
  Administered 2014-08-20: 5 mg via ORAL
  Filled 2014-08-19: qty 1

## 2014-08-19 MED ORDER — SODIUM CHLORIDE 0.9 % IJ SOLN
3.0000 mL | Freq: Two times a day (BID) | INTRAMUSCULAR | Status: DC
  Administered 2014-08-20: 3 mL via INTRAVENOUS

## 2014-08-19 MED ORDER — SODIUM CHLORIDE 0.9 % IV SOLN
INTRAVENOUS | Status: DC
  Administered 2014-08-19: 18:00:00 via INTRAVENOUS

## 2014-08-19 MED ORDER — ATORVASTATIN CALCIUM 10 MG PO TABS
10.0000 mg | ORAL_TABLET | Freq: Every day | ORAL | Status: DC
  Administered 2014-08-19: 10 mg via ORAL
  Filled 2014-08-19: qty 1

## 2014-08-19 NOTE — ED Notes (Signed)
Pt. In MRI.

## 2014-08-19 NOTE — H&P (Addendum)
[facility] History and Physical  Eddie Graves IRC:[id number] DOB: 1931/01/13 DOA: 08/19/2014  Referring physician: *ED  PCP: Bufford Spikes, DO   Chief Complaint:   HPI:  80 y.o. male with HTN, DM, hyperlipidemia, CAD, COPD, blindness, spinal stenosis, comes in accompanied by family members for further evaluation of new onset jerkiness left arm.Since yesterday he had experienced 4 episodes of sudden uncontrollable, painless movements that he described as " flopping around without control' each lasting approximately 10 minutes.  He remains fully aware of his surrounding during the episodes.  He said that the 2 episodes of today had been " more violent than yesterday" and there is no warning. Report mild dizziness after each  episode, Denies chest pain. Grips equal, no drift noted at this time. Pt. Alert and oriented x4. Marland KitchenDenies involvement of the left leg or left side of his face. Slight HA but denies focal weakness or numbness, slurred speech, imbalance, confusion, vertigo, or doule vision. just had stress test last month with Dr. Sharyn Lull Not sleeping well, Saw PCP on 8/20 .   Has been out of zoloft for a long time. Was sleeping well when he took that and for a while after he completed the month's supply (never refilled). Just recently started Zoloft for depression.No recent fever or medical illnesses.  No head trauma.  CT brain today showed no acute abnormality or chronic lesions that could explain his symptoms.    Review of Systems: negative for the following  Constitutional: Denies fever, chills, diaphoresis, appetite change and fatigue.  HEENT: Denies photophobia, eye pain, redness, hearing loss, ear pain, congestion, sore throat, rhinorrhea, sneezing, mouth sores, trouble swallowing, neck pain, neck stiffness and tinnitus.  Respiratory: Denies SOB, DOE, cough, chest tightness, and wheezing.  Cardiovascular: Denies chest pain, palpitations and leg swelling.  Gastrointestinal: Denies  nausea, vomiting, abdominal pain, diarrhea, constipation, blood in stool and abdominal distention.  Genitourinary: Denies dysuria, urgency, frequency, hematuria, flank pain and difficulty urinating.  Musculoskeletal: Denies myalgias, back pain, joint swelling, arthralgias and gait problem.  Skin: Denies pallor, rash and wound.  Neurological: Denies dizziness, seizures, syncope, weakness, light-headedness, numbness and headaches. Uncontrollable movements left arm  Hematological: Denies adenopathy. Easy bruising, personal or family bleeding history  Psychiatric/Behavioral: Denies suicidal ideation, mood changes, confusion, nervousness, sleep disturbance and agitation       Past Medical History  Diagnosis Date  . DM (diabetes mellitus) type II controlled peripheral vascular disorder   . Essential hypertension, benign   . Blind   . Headache(784.0)   . Sebaceous cyst   . Chronic airway obstruction, not elsewhere classified   . Cough   . Spinal stenosis, lumbar region, with neurogenic claudication   . Coronary atherosclerosis of native coronary artery   . Vitamin D deficiency   . Rash and other nonspecific skin eruption   . Other and unspecified hyperlipidemia   . Anxiety state, unspecified   . Depressive disorder, not elsewhere classified   . Legal blindness, as defined in [place]   . Reflux esophagitis   . Unspecified constipation   . Pain in joint, site unspecified   . Memory loss   . Palpitations   . Type II diabetes mellitus with neurological manifestations, uncontrolled   . H/O hiatal hernia      Past Surgical History  Procedure Laterality Date  . Coronary angioplasty with stent placement    . Hernia repair    . Eye surgery        Social History:  reports that  he quit smoking about 17 months ago. His smoking use included Cigarettes. He has a 50 pack-year smoking history. He has never used smokeless tobacco. He reports that he does not drink alcohol or use illicit drugs.     Allergies  Allergen Reactions  . Flexeril [Cyclobenzaprine]     delirium    Family History  Problem Relation Age of Onset  . Asthma Mother   . Diabetes Son   . Cancer Daughter   . Diabetes Son      Prior to Admission medications   Medication Sig Start Date End Date Taking? Authorizing Provider  acetaminophen (TYLENOL) 500 MG tablet Take 500 mg by mouth every 8 (eight) hours as needed for pain.    Yes Historical Provider, MD  albuterol (PROVENTIL HFA;VENTOLIN HFA) 108 (90 BASE) MCG/ACT inhaler Inhale 2 puffs into the lungs every 2 (two) hours as needed for wheezing or shortness of breath. 07/26/14  Yes Tiffany L Reed, DO  amLODipine (NORVASC) 2.5 MG tablet Take 2.5 mg by mouth daily. 08/13/14  Yes Historical Provider, MD  Cholecalciferol (VITAMIN D) 2000 UNITS CAPS Take 1 capsule by mouth daily.   Yes Historical Provider, MD  CRESTOR 20 MG tablet Take 20 mg by mouth daily. 08/13/14  Yes Historical Provider, MD  esomeprazole (NEXIUM) 40 MG packet Take 40 mg by mouth daily before breakfast.   Yes Historical Provider, MD  etodolac (LODINE) 400 MG tablet Take 400 mg by mouth 2 (two) times daily.   Yes Historical Provider, MD  GARLIC PO Take 75 mg by mouth daily.    Yes Historical Provider, MD  HYDROcodone-acetaminophen (NORCO) 10-325 MG per tablet Take 1 tablet by mouth 2 (two) times daily as needed (pain).   Yes Historical Provider, MD  linagliptin (TRADJENTA) 5 MG TABS tablet Take 1 tablet (5 mg total) by mouth daily. 07/26/14  Yes Tiffany L Reed, DO  lisinopril (PRINIVIL,ZESTRIL) 10 MG tablet Take 1 tablet (10 mg total) by mouth daily. FOR BLOOD PRESSURE 07/24/14  Yes Mahima Glade Lloyd, MD  metFORMIN (GLUCOPHAGE) 500 MG tablet Take 1,000 mg by mouth daily with breakfast.   Yes Historical Provider, MD  metoprolol succinate (TOPROL-XL) 50 MG 24 hr tablet Take 50 mg by mouth daily.    Yes Historical Provider, MD  Multiple Vitamin (MULITIVITAMIN WITH MINERALS) TABS Take 1 tablet by mouth daily.   Yes  Historical Provider, MD  nitroGLYCERIN (NITRODUR - DOSED IN MG/24 HR) 0.4 mg/hr Place 1 patch onto the skin daily.   Yes Historical Provider, MD  Omega 3 1200 MG CAPS Take 2,400 mg by mouth daily.    Yes Historical Provider, MD  sertraline (ZOLOFT) 50 MG tablet Take 1 tablet (50 mg total) by mouth daily. 07/26/14  Yes Tiffany L Reed, DO  glucose blood test strip Check blood sugar 2 x daily 10/05/13   Kermit Balo, DO     Physical Exam: Filed Vitals:   08/19/14 1300 08/19/14 1330 08/19/14 1345 08/19/14 1419  BP: 131/74 127/78 123/73 117/86  Pulse: 71 67 70 76  Temp:      TempSrc:      Resp: 18 19 17 15   Height:      Weight:      SpO2: 98% 98% 96% 99%     Constitutional: Vital signs reviewed. Patient is a well-developed and well-nourished in no acute distress and cooperative with exam. Alert and oriented x3.  Head: Normocephalic and atraumatic  Ear: TM normal bilaterally  Mouth: no erythema or exudates,  MMM  Eyes: PERRL, EOMI, conjunctivae normal, No scleral icterus.  Neck: Supple, Trachea midline normal ROM, No JVD, mass, thyromegaly, or carotid bruit present.  Cardiovascular: RRR, S1 normal, S2 normal, no MRG, pulses symmetric and intact bilaterally  Pulmonary/Chest: CTAB, no wheezes, rales, or rhonchi  Abdominal: Soft. Non-tender, non-distended, bowel sounds are normal, no masses, organomegaly, or guarding present.  GU: no CVA tenderness Musculoskeletal: No joint deformities, erythema, or stiffness, ROM full and no nontender Ext: no edema and no cyanosis, pulses palpable bilaterally (DP and PT)  Hematology: no cervical, inginal, or axillary adenopathy.  Neurological: A&O x3, Strenght is normal and symmetric bilaterally, Cranial nerve 2 deficits bilaterally, otherwise cranial nerves II through XII grossly intact. Moves all extremities well DTRs symmetric bilaterally knee jerk ankle jerk and biceps  Skin: Warm, dry and intact. No rash, cyanosis, or clubbing.  Psychiatric: Normal  mood and affect. speech and behavior is normal. Judgment and thought content normal. Cognition and memory are normal.       Labs on Admission:    Basic Metabolic Panel:  Recent Labs Lab 08/19/14 1210  NA 140  K 4.2  CL 101  CO2 24  GLUCOSE 215*  BUN 15  CREATININE 1.07  CALCIUM 9.3   Liver Function Tests:  Recent Labs Lab 08/19/14 1210  AST 32  ALT 19  ALKPHOS 86  BILITOT 0.5  PROT 6.9  ALBUMIN 3.4*   No results found for this basename: LIPASE, AMYLASE,  in the last 168 hours No results found for this basename: AMMONIA,  in the last 168 hours CBC:  Recent Labs Lab 08/19/14 1210  WBC 7.3  NEUTROABS 3.9  HGB 12.6*  HCT 37.2*  MCV 89.6  PLT 193   Cardiac Enzymes: No results found for this basename: CKTOTAL, CKMB, CKMBINDEX, TROPONINI,  in the last 168 hours  BNP (last 3 results) No results found for this basename: PROBNP,  in the last 8760 hours    CBG:  Recent Labs Lab 08/19/14 1203  GLUCAP 181*    Radiological Exams on Admission: Ct Head Wo Contrast  08/19/2014   CLINICAL DATA:  Weakness. Dizziness. Possible seizure like activity.  EXAM: CT HEAD WITHOUT CONTRAST  TECHNIQUE: Contiguous axial images were obtained from the base of the skull through the vertex without intravenous contrast.  COMPARISON:  Head CT 01/20/2014.  FINDINGS: Mild cerebral and cerebellar atrophy. No acute intracranial abnormalities. Specifically, no evidence of acute intracranial hemorrhage, no definite findings of acute/subacute cerebral ischemia, no mass, mass effect, hydrocephalus or abnormal intra or extra-axial fluid collections. Visualized paranasal sinuses and mastoids are well pneumatized. No acute displaced skull fractures are identified. 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 The right globe is filled with high attenuation material, and partially calcified.  IMPRESSION: 1. No acute intracranial abnormalities. 2. Mild cerebral and cerebellar  atrophy.   Electronically Signed   By: Trudie Reed M.D.   On: 08/19/2014 13:15   Mr Laqueta Jean CH Contrast  08/19/2014   CLINICAL DATA:  Left focal motor seizure  EXAM: MRI HEAD WITHOUT AND WITH CONTRAST  TECHNIQUE: Multiplanar, multiecho pulse sequences of the brain and surrounding structures were obtained without and with intravenous contrast.  CONTRAST:  41mL MULTIHANCE GADOBENATE DIMEGLUMINE 529 MG/ML IV SOLN  COMPARISON:  CT head 08/19/2014  FINDINGS: Chronic atrophy of the globe bilaterally.  Right eye prosthesis.  Mild atrophy.  Negative for hydrocephalus.  Negative for acute infarct.  Small area of chronic hemorrhage right parietal  convexity. This is likely an area of subarachnoid hemorrhage which is chronic, subarachnoid hemorrhage was noted in this area on 05/30/2013.  Small white matter hyperintensities in the frontal white matter consistent with chronic microvascular ischemia which is mild.  Postcontrast imaging reveals no enhancing mass lesion. Enhancing vessel in the right parietal lobe in the area of hemorrhage is asymmetric and slightly prominent. This could represent a small vascular malformation such is developmental venous anomaly which has bled in the past.  IMPRESSION: Negative for acute infarct.  Chronic subarachnoid hemorrhage on the right as noted on prior studies. Question underlying developmental venous anomaly in this area which has caused bleeding in the past. See prior CT and MRI 05/30/2013.   Electronically Signed   By: Marlan Palau M.D.   On: 08/19/2014 15:47    EKG: Independently reviewed.   Assessment/Plan Active Problems:   Focal seizure   Seizure    paroxysmal jerking movements of left arm  concerning for a left focal motor seizure.  Neuro-exam and CT brain are unremarkable.  EEG and MRI brain with and without contrast.  Trial of keppra.received 1000 mg in ED  MRI Negative for acute infarct   COPD exacerbation  Continue bronchodilators     . Essential  hypertension, benign  - Continue lisinopril, metoprolol and transdermal nitroglycerin   . history of SAH (subarachnoid hemorrhage)  - Currently stable, without headaches, moving all 4 extremities  - Per family, he is not taking effient anymore, since his subarachnoid hemorrhage last year  . Type II diabetes mellitus with neurological manifestations, uncontrolled  A1C 8.6, hold metformin , Start SSI  . Spinal stenosis, lumbar region, with neurogenic claudication  - As needed narcotics   . Coronary atherosclerosis of native coronary artery  Continue beta blocker  . History of seizures  - Continue with Keppra   . Mild mediastinal lymphadenopathy on last CT   - Suspect this is likely reactive, further workup if needed can be done in the outpatient setting    . 3 cm right renal cyst  seen in the CT scan of the chest, we'll defer further workup to the outpatient setting    Code Status:   full Family Communication: bedside Disposition Plan: admit   Time spent: 70 mins   [facility] Pager [phone]  If 7PM-7AM, please contact night-coverage [email or web address] Password [facility] 08/19/2014, 4:04 PM

## 2014-08-19 NOTE — ED Provider Notes (Signed)
CSN: [id number]     Arrival date & time 08/19/14  1039 History   First MD Initiated Contact with Patient 08/19/14 1112     Chief Complaint  Patient presents with  . Extremity Weakness    left arm cant control  x 2     (Consider location/radiation/quality/duration/timing/severity/associated sxs/prior Treatment) HPI Patient reports that he's had uncontrollable movement of his left arm, 3 episodes over the past 2 days each lasting approximately 10 minutes. He denies other symptoms denies weakness, no chest pain. No headache. No other associated symptoms. Presently asymptomatic. No treatment prior to coming here. Past Medical History  Diagnosis Date  . DM (diabetes mellitus) type II controlled peripheral vascular disorder   . Essential hypertension, benign   . Blind   . Headache(784.0)   . Sebaceous cyst   . Chronic airway obstruction, not elsewhere classified   . Cough   . Spinal stenosis, lumbar region, with neurogenic claudication   . Coronary atherosclerosis of native coronary artery   . Vitamin D deficiency   . Rash and other nonspecific skin eruption   . Other and unspecified hyperlipidemia   . Anxiety state, unspecified   . Depressive disorder, not elsewhere classified   . Legal blindness, as defined in [place]   . Reflux esophagitis   . Unspecified constipation   . Pain in joint, site unspecified   . Memory loss   . Palpitations   . Type II diabetes mellitus with neurological manifestations, uncontrolled   . H/O hiatal hernia    Past Surgical History  Procedure Laterality Date  . Coronary angioplasty with stent placement    . Hernia repair    . Eye surgery     Family History  Problem Relation Age of Onset  . Asthma Mother   . Diabetes Son   . Cancer Daughter   . Diabetes Son    History  Substance Use Topics  . Smoking status: Former Smoker -- 1.00 packs/day for 50 years    Types: Cigarettes    Quit date: 03/07/2013  . Smokeless tobacco: Never Used  . Alcohol  Use: No    Review of Systems  Constitutional: Negative.   HENT: Negative.   Eyes: Positive for visual disturbance.       Blind.  Respiratory: Negative.   Cardiovascular: Negative.   Gastrointestinal: Negative.   Musculoskeletal: Negative.   Skin: Negative.   Neurological: Negative.        Uncontrollable movements left arm  Psychiatric/Behavioral: Negative.   All other systems reviewed and are negative.     Allergies  Flexeril  Home Medications   Prior to Admission medications   Medication Sig Start Date End Date Taking? Authorizing Provider  acetaminophen (TYLENOL) 500 MG tablet Take 500 mg by mouth every 8 (eight) hours as needed for pain.    Yes Historical Provider, MD  albuterol (PROVENTIL HFA;VENTOLIN HFA) 108 (90 BASE) MCG/ACT inhaler Inhale 2 puffs into the lungs every 2 (two) hours as needed for wheezing or shortness of breath. 07/26/14  Yes Tiffany L Reed, DO  amLODipine (NORVASC) 2.5 MG tablet Take 2.5 mg by mouth daily. 08/13/14  Yes Historical Provider, MD  Cholecalciferol (VITAMIN D) 2000 UNITS CAPS Take 1 capsule by mouth daily.   Yes Historical Provider, MD  CRESTOR 20 MG tablet Take 20 mg by mouth daily. 08/13/14  Yes Historical Provider, MD  esomeprazole (NEXIUM) 40 MG packet Take 40 mg by mouth daily before breakfast.   Yes Historical Provider, MD  etodolac (  LODINE) 400 MG tablet Take 400 mg by mouth 2 (two) times daily.   Yes Historical Provider, MD  GARLIC PO Take 75 mg by mouth daily.    Yes Historical Provider, MD  HYDROcodone-acetaminophen (NORCO) 10-325 MG per tablet Take 1 tablet by mouth 2 (two) times daily as needed (pain).   Yes Historical Provider, MD  linagliptin (TRADJENTA) 5 MG TABS tablet Take 1 tablet (5 mg total) by mouth daily. 07/26/14  Yes Tiffany L Reed, DO  lisinopril (PRINIVIL,ZESTRIL) 10 MG tablet Take 1 tablet (10 mg total) by mouth daily. FOR BLOOD PRESSURE 07/24/14  Yes Mahima Glade Lloyd, MD  metFORMIN (GLUCOPHAGE) 500 MG tablet Take 1,000 mg by  mouth daily with breakfast.   Yes Historical Provider, MD  metoprolol succinate (TOPROL-XL) 50 MG 24 hr tablet Take 50 mg by mouth daily.    Yes Historical Provider, MD  Multiple Vitamin (MULITIVITAMIN WITH MINERALS) TABS Take 1 tablet by mouth daily.   Yes Historical Provider, MD  nitroGLYCERIN (NITRODUR - DOSED IN MG/24 HR) 0.4 mg/hr Place 1 patch onto the skin daily.   Yes Historical Provider, MD  Omega 3 1200 MG CAPS Take 2,400 mg by mouth daily.    Yes Historical Provider, MD  sertraline (ZOLOFT) 50 MG tablet Take 1 tablet (50 mg total) by mouth daily. 07/26/14  Yes Tiffany L Reed, DO  glucose blood test strip Check blood sugar 2 x daily 10/05/13   Tiffany L Reed, DO   BP 113/71  Pulse 67  Temp(Src) 98.7 F (37.1 C) (Oral)  Resp 17  Ht 5\' 6"  (1.676 m)  Wt 210 lb (95.255 kg)  BMI 33.91 kg/m2  SpO2 98% Physical Exam  Nursing note and vitals reviewed. Constitutional: He appears well-developed and well-nourished.  HENT:  Head: Normocephalic and atraumatic.  Eyes:  Bilateral cornea is opacified  Neck: Neck supple. No tracheal deviation present. No thyromegaly present.  Cardiovascular: Normal rate and regular rhythm.   No murmur heard. Pulmonary/Chest: Effort normal and breath sounds normal.  Abdominal: Soft. Bowel sounds are normal. He exhibits no distension. There is no tenderness.  Musculoskeletal: Normal range of motion. He exhibits no edema and no tenderness.  Neurological: He is alert. A cranial nerve deficit is present. Coordination normal.  Cranial nerve 2 deficits bilaterally, otherwise cranial nerves II through XII grossly intact. Moves all extremities well DTRs symmetric bilaterally knee jerk ankle jerk and biceps  Skin: Skin is warm and dry. No rash noted.  Psychiatric: He has a normal mood and affect.    ED Course  Procedures (including critical care time) Labs Review Labs Reviewed - No data to display  Imaging Review No results found.   EKG  Interpretation   Date/Time:  Sunday August 19 2014 12:35:20 EDT Ventricular Rate:  70 PR Interval:  254 QRS Duration: 99 QT Interval:  418 QTC Calculation: 451 R Axis:   -51 Text Interpretation:  Sinus rhythm Multiform ventricular premature  complexes Prolonged PR interval LAD, consider left anterior fascicular  block Premature ventricular complexes Since last tracing rate slower  Confirmed by Jaysean Manville  MD, Gusta Marksberry [phone]) on 08/19/2014 1:44:43 PM     Results for orders placed during the hospital encounter of 08/19/14  COMPREHENSIVE METABOLIC PANEL      Result Value Ref Range   Sodium 140  137 - 147 mEq/L   Potassium 4.2  3.7 - 5.3 mEq/L   Chloride 101  96 - 112 mEq/L   CO2 24  19 - 32 mEq/L  Glucose, Bld 215 (*) 70 - 99 mg/dL   BUN 15  6 - 23 mg/dL   Creatinine, Ser 0.07  0.50 - 1.35 mg/dL   Calcium 9.3  8.4 - 12.1 mg/dL   Total Protein 6.9  6.0 - 8.3 g/dL   Albumin 3.4 (*) 3.5 - 5.2 g/dL   AST 32  0 - 37 U/L   ALT 19  0 - 53 U/L   Alkaline Phosphatase 86  39 - 117 U/L   Total Bilirubin 0.5  0.3 - 1.2 mg/dL   GFR calc non Af Amer 63 (*) >90 mL/min   GFR calc Af Amer 73 (*) >90 mL/min   Anion gap 15  5 - 15  CBC WITH DIFFERENTIAL      Result Value Ref Range   WBC 7.3  4.0 - 10.5 K/uL   RBC 4.15 (*) 4.22 - 5.81 MIL/uL   Hemoglobin 12.6 (*) 13.0 - 17.0 g/dL   HCT 97.5 (*) 88.3 - 25.4 %   MCV 89.6  78.0 - 100.0 fL   MCH 30.4  26.0 - 34.0 pg   MCHC 33.9  30.0 - 36.0 g/dL   RDW 98.2  64.1 - 58.3 %   Platelets 193  150 - 400 K/uL   Neutrophils Relative % 53  43 - 77 %   Neutro Abs 3.9  1.7 - 7.7 K/uL   Lymphocytes Relative 32  12 - 46 %   Lymphs Abs 2.3  0.7 - 4.0 K/uL   Monocytes Relative 10  3 - 12 %   Monocytes Absolute 0.7  0.1 - 1.0 K/uL   Eosinophils Relative 4  0 - 5 %   Eosinophils Absolute 0.3  0.0 - 0.7 K/uL   Basophils Relative 1  0 - 1 %   Basophils Absolute 0.0  0.0 - 0.1 K/uL  CBG MONITORING, ED      Result Value Ref Range   Glucose-Capillary 181  (*) 70 - 99 mg/dL   Ct Head Wo Contrast  08/19/2014   CLINICAL DATA:  Weakness. Dizziness. Possible seizure like activity.  EXAM: CT HEAD WITHOUT CONTRAST  TECHNIQUE: Contiguous axial images were obtained from the base of the skull through the vertex without intravenous contrast.  COMPARISON:  Head CT 01/20/2014.  FINDINGS: Mild cerebral and cerebellar atrophy. No acute intracranial abnormalities. Specifically, no evidence of acute intracranial hemorrhage, no definite findings of acute/subacute cerebral ischemia, no mass, mass effect, hydrocephalus or abnormal intra or extra-axial fluid collections. Visualized paranasal sinuses and mastoids are well pneumatized. No acute displaced skull fractures are identified. 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 The right globe is filled with high attenuation material, and partially calcified.  IMPRESSION: 1. No acute intracranial abnormalities. 2. Mild cerebral and cerebellar atrophy.   Electronically Signed   By: Trudie Reed M.D.   On: 08/19/2014 13:15    MDM  Neurology service consult on case request medical admission. Keppra. I spoke with Dr.Abrol plan admit telemetry Diagnosis #4focal seizure #2hyperglycemia Final diagnoses:  None        Doug Sou, MD 08/19/14 1352

## 2014-08-19 NOTE — ED Notes (Signed)
Pt. Stated, I ve had 2 times when my left arm felt like it was uncontrollable. Today I just notice I felt weak.

## 2014-08-19 NOTE — Consult Note (Addendum)
NEURO HOSPITALIST CONSULT NOTE    Reason for Consult: ABNORMAL MOVEMENTS LEFT ARM, CONCERN FOR SEIZURES  HPI:                                                                                                                                          Eddie Graves is an 80 y.o. male with HTN, DM, hyperlipidemia, CAD, COPD, blindness, spinal stenosis, comes in accompanied by family members for further evaluation of new onset jerkiness left arm. He indicated that since yesterday he had experienced 4 episodes of sudden uncontrollable, painless movements that he described as " flopping around without control movements and I have to use the opposite hand to control it". They last approximately 10 minutes and slowly subside. He remains fully aware of his surrounding during the episodes. He said that the 2 episodes of today had been " more violent than yesterday" and there is not warning. Denies involvement of the left leg or left side of his face. Slight HA but denies focal weakness or numbness, slurred speech, imbalance, confusion, vertigo, or doule vision. No recent fever or medical illnesses. No head trauma. CT brain today showed no acute abnormality or chronic lesions that could explain his symptoms.   Past Medical History  Diagnosis Date  . DM (diabetes mellitus) type II controlled peripheral vascular disorder   . Essential hypertension, benign   . Blind   . Headache(784.0)   . Sebaceous cyst   . Chronic airway obstruction, not elsewhere classified   . Cough   . Spinal stenosis, lumbar region, with neurogenic claudication   . Coronary atherosclerosis of native coronary artery   . Vitamin D deficiency   . Rash and other nonspecific skin eruption   . Other and unspecified hyperlipidemia   . Anxiety state, unspecified   . Depressive disorder, not elsewhere classified   . Legal blindness, as defined in [place]   . Reflux esophagitis   . Unspecified constipation   . Pain in  joint, site unspecified   . Memory loss   . Palpitations   . Type II diabetes mellitus with neurological manifestations, uncontrolled   . H/O hiatal hernia     Past Surgical History  Procedure Laterality Date  . Coronary angioplasty with stent placement    . Hernia repair    . Eye surgery      Family History  Problem Relation Age of Onset  . Asthma Mother   . Diabetes Son   . Cancer Daughter   . Diabetes Son     Social History:  reports that he quit smoking about 17 months ago. His smoking use included Cigarettes. He has a 50 pack-year smoking history. He has never used smokeless tobacco. He reports that he does not drink alcohol or use illicit  drugs.  Allergies  Allergen Reactions  . Flexeril [Cyclobenzaprine]     delirium    MEDICATIONS:                                                                                                                     I have reviewed the patient's current medications.   ROS:                                                                                                                                       History obtained from the patient and chart review.  General ROS: negative for - chills, fatigue, fever, night sweats, weight gain or weight loss Psychological ROS: negative for - behavioral disorder, hallucinations, memory difficulties, mood swings or suicidal ideation Ophthalmic ROS: significant  for blidness ENT ROS: negative for - epistaxis, nasal discharge, oral lesions, sore throat, tinnitus or vertigo Allergy and Immunology ROS: negative for - hives or itchy/watery eyes Hematological and Lymphatic ROS: negative for - bleeding problems, bruising or swollen lymph nodes Endocrine ROS: negative for - galactorrhea, hair pattern changes, polydipsia/polyuria or temperature intolerance Respiratory ROS: negative for - cough or hemoptysis Cardiovascular ROS: negative for - chest pain, edema or irregular heartbeat Gastrointestinal ROS:  negative for - abdominal pain, diarrhea, hematemesis, nausea/vomiting or stool incontinence Genito-Urinary ROS: negative for - dysuria, hematuria, incontinence or urinary frequency/urgency Musculoskeletal ROS: negative for - joint swelling or muscular weakness Neurological ROS: as noted in HPI Dermatological ROS: negative for rash and skin lesion changes  Physical exam: pleasant male in no apparent distress. Blood pressure 122/74, pulse 70, temperature 98.7 F (37.1 C), temperature source Oral, resp. rate 18, height $RemoveBe'5\' 6"'xYLHignLW$  (1.676 m), weight 95.255 kg (210 lb), SpO2 98.00%. Head: normocephalic. Neck: supple, no bruits, no JVD. Cardiac: no murmurs. Lungs: clear. Abdomen: soft, no tender, no mass. Extremities: mild pitting edema bilateral. Neurologic Examination:  General: Mental Status: Alert, oriented, thought content appropriate.  Speech fluent without evidence of aphasia.  Able to follow 3 step commands without difficulty. Cranial Nerves: II: Discs flat bilaterally; Visual fields grossly normal, bilateral blindness with right artificial eye  III,IV, VI: ptosis not present, extra-ocular motions intact bilaterally V,VII: smile symmetric, facial light touch sensation normal bilaterally VIII: hearing normal bilaterally IX,X: gag reflex present XI: bilateral shoulder shrug XII: midline tongue extension without atrophy or fasciculations Motor: Right : Upper extremity   5/5    Left:     Upper extremity   5/5  Lower extremity   5/5     Lower extremity   5/5 Tone and bulk:normal tone throughout; no atrophy noted Sensory: Pinprick and light touch intact throughout, bilaterally Deep Tendon Reflexes:  Right: Upper Extremity   Left: Upper extremity   biceps (C-5 to C-6) 2/4   biceps (C-5 to C-6) 2/4 tricep (C7) 2/4    triceps (C7) 2/4 Brachioradialis (C6) 2/4  Brachioradialis (C6) 2/4  Lower  Extremity Lower Extremity  quadriceps (L-2 to L-4) 2/4   quadriceps (L-2 to L-4) 2/4 Achilles (S1) 2/4   Achilles (S1) 2/4  Plantars: Right: downgoing   Left: downgoing Cerebellar: normal finger-to-nose,  normal heel-to-shin test Gait:  No tested  Lab Results  Component Value Date/Time   CHOL  Value: 125        ATP III CLASSIFICATION:  <200     mg/dL   Desirable  200-239  mg/dL   Borderline High  >=240    mg/dL   High        04/05/2009 10:10 AM    Results for orders placed during the hospital encounter of 08/19/14 (from the past 48 hour(s))  CBG MONITORING, ED     Status: Abnormal   Collection Time    08/19/14 12:03 PM      Result Value Ref Range   Glucose-Capillary 181 (*) 70 - 99 mg/dL  COMPREHENSIVE METABOLIC PANEL     Status: Abnormal   Collection Time    08/19/14 12:10 PM      Result Value Ref Range   Sodium 140  137 - 147 mEq/L   Potassium 4.2  3.7 - 5.3 mEq/L   Chloride 101  96 - 112 mEq/L   CO2 24  19 - 32 mEq/L   Glucose, Bld 215 (*) 70 - 99 mg/dL   BUN 15  6 - 23 mg/dL   Creatinine, Ser 1.07  0.50 - 1.35 mg/dL   Calcium 9.3  8.4 - 10.5 mg/dL   Total Protein 6.9  6.0 - 8.3 g/dL   Albumin 3.4 (*) 3.5 - 5.2 g/dL   AST 32  0 - 37 U/L   ALT 19  0 - 53 U/L   Alkaline Phosphatase 86  39 - 117 U/L   Total Bilirubin 0.5  0.3 - 1.2 mg/dL   GFR calc non Af Amer 63 (*) >90 mL/min   GFR calc Af Amer 73 (*) >90 mL/min   Comment: (NOTE)     The eGFR has been calculated using the CKD EPI equation.     This calculation has not been validated in all clinical situations.     eGFR's persistently <90 mL/min signify possible Chronic Kidney     Disease.   Anion gap 15  5 - 15  CBC WITH DIFFERENTIAL     Status: Abnormal   Collection Time    08/19/14 12:10 PM      Result  Value Ref Range   WBC 7.3  4.0 - 10.5 K/uL   RBC 4.15 (*) 4.22 - 5.81 MIL/uL   Hemoglobin 12.6 (*) 13.0 - 17.0 g/dL   HCT 37.2 (*) 39.0 - 52.0 %   MCV 89.6  78.0 - 100.0 fL   MCH 30.4  26.0 - 34.0 pg   MCHC  33.9  30.0 - 36.0 g/dL   RDW 12.9  11.5 - 15.5 %   Platelets 193  150 - 400 K/uL   Neutrophils Relative % 53  43 - 77 %   Neutro Abs 3.9  1.7 - 7.7 K/uL   Lymphocytes Relative 32  12 - 46 %   Lymphs Abs 2.3  0.7 - 4.0 K/uL   Monocytes Relative 10  3 - 12 %   Monocytes Absolute 0.7  0.1 - 1.0 K/uL   Eosinophils Relative 4  0 - 5 %   Eosinophils Absolute 0.3  0.0 - 0.7 K/uL   Basophils Relative 1  0 - 1 %   Basophils Absolute 0.0  0.0 - 0.1 K/uL    No results found.  Assessment/Plan: 80 y/o with new onset paroxysmal jerking movements of left arm without associated alteration of consciousness concerning for a left focal motor seizure. Neuro-exam and CT brain are unremarkable. Recommend admission to the hospital to complete neuro work up with EEG and MRI brain with and without contrast.  Trial of keppra. Will follow up.     Dorian Pod, MD 08/19/2014, 1:12 PM

## 2014-08-19 NOTE — ED Notes (Signed)
Pt. Reports 2 episodes yesterday and 2 episodes today of left arm feeling weak and uncontrollable. States "It was just flopping around and I couldn't control it, it was really heavy". Pt. Reports episodes last approximately 10 minutes, does report mild dizziness after episode. Denies chest pain. Grips equal, no drift noted at this time. Pt. Alert and oriented x4.

## 2014-08-20 ENCOUNTER — Inpatient Hospital Stay (HOSPITAL_COMMUNITY): Payer: Medicare Other

## 2014-08-20 DIAGNOSIS — E1142 Type 2 diabetes mellitus with diabetic polyneuropathy: Secondary | ICD-10-CM

## 2014-08-20 DIAGNOSIS — F3289 Other specified depressive episodes: Secondary | ICD-10-CM

## 2014-08-20 DIAGNOSIS — I10 Essential (primary) hypertension: Secondary | ICD-10-CM

## 2014-08-20 DIAGNOSIS — E785 Hyperlipidemia, unspecified: Secondary | ICD-10-CM

## 2014-08-20 DIAGNOSIS — H543 Unqualified visual loss, both eyes: Secondary | ICD-10-CM

## 2014-08-20 DIAGNOSIS — M48062 Spinal stenosis, lumbar region with neurogenic claudication: Secondary | ICD-10-CM

## 2014-08-20 DIAGNOSIS — E1149 Type 2 diabetes mellitus with other diabetic neurological complication: Secondary | ICD-10-CM

## 2014-08-20 DIAGNOSIS — F17200 Nicotine dependence, unspecified, uncomplicated: Secondary | ICD-10-CM

## 2014-08-20 DIAGNOSIS — F329 Major depressive disorder, single episode, unspecified: Secondary | ICD-10-CM

## 2014-08-20 DIAGNOSIS — I609 Nontraumatic subarachnoid hemorrhage, unspecified: Secondary | ICD-10-CM

## 2014-08-20 DIAGNOSIS — J449 Chronic obstructive pulmonary disease, unspecified: Secondary | ICD-10-CM

## 2014-08-20 DIAGNOSIS — J4489 Other specified chronic obstructive pulmonary disease: Secondary | ICD-10-CM

## 2014-08-20 LAB — CBC
HCT: 38.9 % — ABNORMAL LOW (ref 39.0–52.0)
Hemoglobin: 12.8 g/dL — ABNORMAL LOW (ref 13.0–17.0)
MCH: 29.6 pg (ref 26.0–34.0)
MCHC: 32.9 g/dL (ref 30.0–36.0)
MCV: 89.8 fL (ref 78.0–100.0)
PLATELETS: 192 10*3/uL (ref 150–400)
RBC: 4.33 MIL/uL (ref 4.22–5.81)
RDW: 12.9 % (ref 11.5–15.5)
WBC: 6.5 10*3/uL (ref 4.0–10.5)

## 2014-08-20 LAB — COMPREHENSIVE METABOLIC PANEL
ALK PHOS: 91 U/L (ref 39–117)
ALT: 20 U/L (ref 0–53)
AST: 30 U/L (ref 0–37)
Albumin: 3.7 g/dL (ref 3.5–5.2)
Anion gap: 12 (ref 5–15)
BILIRUBIN TOTAL: 0.6 mg/dL (ref 0.3–1.2)
BUN: 10 mg/dL (ref 6–23)
CALCIUM: 9.9 mg/dL (ref 8.4–10.5)
CHLORIDE: 101 meq/L (ref 96–112)
CO2: 27 mEq/L (ref 19–32)
Creatinine, Ser: 0.97 mg/dL (ref 0.50–1.35)
GFR, EST AFRICAN AMERICAN: 87 mL/min — AB (ref >90)
GFR, EST NON AFRICAN AMERICAN: 75 mL/min — AB (ref >90)
GLUCOSE: 169 mg/dL — AB (ref 70–99)
Potassium: 4.2 mEq/L (ref 3.7–5.3)
Sodium: 140 mEq/L (ref 137–147)
Total Protein: 7.4 g/dL (ref 6.0–8.3)

## 2014-08-20 LAB — TROPONIN I
Troponin I: <0.3 ng/mL (ref <0.30)
Troponin I: <0.3 ng/mL (ref <0.30)

## 2014-08-20 MED ORDER — LEVETIRACETAM ER 500 MG PO TB24: 1000.0000 mg | ORAL_TABLET | Freq: Every day | ORAL | Status: DC

## 2014-08-20 MED ORDER — IOHEXOL 350 MG/ML SOLN
50.0000 mL | Freq: Once | INTRAVENOUS | Status: AC | PRN
  Administered 2014-08-20: 50 mL via INTRAVENOUS

## 2014-08-20 MED ORDER — NICOTINE 14 MG/24HR TD PT24: 14.0000 mg | MEDICATED_PATCH | Freq: Every day | TRANSDERMAL | Status: DC

## 2014-08-20 MED ORDER — METFORMIN HCL 500 MG PO TABS: 1000.0000 mg | ORAL_TABLET | Freq: Every day | ORAL | Status: DC

## 2014-08-20 NOTE — Discharge Instructions (Signed)

## 2014-08-20 NOTE — Discharge Summary (Signed)
Physician Discharge Summary  Eddie Graves JQZ:[id number] DOB: 1931/07/06 DOA: 08/19/2014  PCP: Bufford Spikes, DO  Admit date: 08/19/2014 Discharge date: 08/20/2014  Time spent: 45 minutes  Recommendations for Outpatient Follow-up:  Patient will be discharged to home.  He is to follow up with his primary care physician within one week of discharge.  Patient should also follow up with neurology as an outpatient. Patient to continue his medications as prescribed. Patient should resume normal activity as tolerated. Patient should follow a heart healthy/ carb modified diet.  Discharge Diagnoses:  Proximal jerking movements of the left arm COPD Essential hypertension History of subarachnoid hemorrhage Diabetes mellitus, type II Spinal stenosis of the lumbar region Coronary artery disease Depression Hyperlipidemia History of seizures Mild mediastinal lymphadenopathy on last CT 3 cm right renal cyst Legal blindness  Discharge Condition: Stable  Diet recommendation: Heart healthy/carb modified  Filed Weights   08/19/14 1100  Weight: 95.255 kg (210 lb)    History of present illness:  On 08/19/2014 80 y.o. male with HTN, DM, hyperlipidemia, CAD, COPD, blindness, spinal stenosis, who presented to the ER with family members for further evaluation of new onset jerkiness left arm.  Since Saturday, he had experienced 4 episodes of sudden uncontrollable, painless movements that he described as " flopping around without control' each lasting approximately 10 minutes. He remained fully aware of his surrounding during the episodes. He said that the 2 episodes on the day of admission, had been  "more violent than yesterday" and there was no warning. Reported mild dizziness after each episode. Denids chest pain. Grips were equal, no drift noted on the day of admission. Patient was alert and oriented x4.  Denied involvement of the left leg or left side of his face. Slight headache but denied focal  weakness or numbness, slurred speech, imbalance, confusion, vertigo, or doule vision.  Patient had a stress test last month with Dr. Sharyn Lull.  He admits to not sleeping well. Saw PCP on 8/20. Has been out of zoloft for a long time. Was sleeping well when he took that and for a while after he completed the month's supply (never refilled). Just recently started Zoloft for depression. No recent fever or medical illnesses. No head trauma.  CT brain showed no acute abnormality or chronic lesions that could explain his symptoms.  Hospital Course:  Paroxysmal jerking movements of the left arm -Concerning for focal motor seizure -Neurology consulted and appreciated -EEG: This awake and asleep EEG is normal. There was a 3-second episode of left hand movements noted above with no EEG correlate.  -CT of the head: No acute intracranial abnormalities. -MRI of the brain: Negative for acute infarct. -CTA; No evidence of arterial aneurysm. There is as small but of recurrent subarachnoid hemorrhage in this region, may be a venous anomaly as well. Spoke to Dr. Cyril Mourning regarding these findings at this time patient is stable to go home. -Neurology recommended a trial of Keppra 1000 mg with outpatient neurology followup  COPD -Currently compensated, not in exacerbation -Continue bronchodilator  Essential hypertension -Continue lisinopril, metoprolol, nitro patch  History of subarachnoid hemorrhage -Currently stable, patient no longer having headaches able to move all 4 extremities -Patient was on Effient, however and there has not been taking it since last year  Diabetes mellitus, type II -Metformin held but may resume upon discharge, however on 9/16 due to CTA -Hemoglobin A1c 8.6 -Was placed on insulin sliding scale with CBG monitoring during hospitalization -Continue metformin and taken to upon discharge  Spinal stenosis of the lumbar region -Continue narcotics as needed  Coronary artery  disease -Stable, no complaints of chest pain -Continue home medications  Depression -Continue Zoloft  Hyperlipidemia -Continue statin  History of seizures -Will continue Keppra  Mild mediastinal lymphadenopathy on last CT -Patient will need further workup as an outpatient  3 cm right renal cyst -Seen on CT of the chest, patient should have workup as an outpatient  Legal Blindness -Stable  Procedures: EEG: This awake and asleep EEG is normal. There was a 3-second episode of left hand movements noted above with no EEG correlate.   Consultations: Neurology  Discharge Exam: Filed Vitals:   08/20/14 1353  BP: 144/87  Pulse: 75  Temp: 97.9 F (36.6 C)  Resp: 18     General: Well developed, well nourished, NAD, appears stated age  HEENT: NCAT, Right artifical eye, bilateral blindness, Anicteic Sclera, mucous membranes moist.  Cardiovascular: S1 S2 auscultated, no rubs, murmurs or gallops. Regular rate and rhythm.  Respiratory: Clear to auscultation bilaterally with equal chest rise  Abdomen: Soft, nontender, nondistended, + bowel sounds  Extremities: warm dry without cyanosis clubbing or edema  Neuro: AAOx3, cranial nerves grossly intact. Strength 5/5 in patient's upper and lower extremities bilaterally  Skin: Without rashes exudates or nodules  Psych: Normal affect and demeanor with intact judgement and insight  Discharge Instructions      Discharge Instructions   Discharge instructions    Complete by:  As directed   Patient will be discharged to home.  He is to follow up with his primary care physician within one week of discharge.  Patient should also follow up with neurology as an outpatient. Patient to continue his medications as prescribed. Patient should resume normal activity as tolerated. Patient should follow a heart healthy/ carb modified diet.            Medication List         acetaminophen 500 MG tablet  Commonly known as:  TYLENOL   Take 500 mg by mouth every 8 (eight) hours as needed for pain.     albuterol 108 (90 BASE) MCG/ACT inhaler  Commonly known as:  PROVENTIL HFA;VENTOLIN HFA  Inhale 2 puffs into the lungs every 2 (two) hours as needed for wheezing or shortness of breath.     amLODipine 2.5 MG tablet  Commonly known as:  NORVASC  Take 2.5 mg by mouth daily.     CRESTOR 20 MG tablet  Generic drug:  rosuvastatin  Take 20 mg by mouth daily.     esomeprazole 40 MG packet  Commonly known as:  NEXIUM  Take 40 mg by mouth daily before breakfast.     etodolac 400 MG tablet  Commonly known as:  LODINE  Take 400 mg by mouth 2 (two) times daily.     GARLIC PO  Take 75 mg by mouth daily.     glucose blood test strip  Check blood sugar 2 x daily     HYDROcodone-acetaminophen 10-325 MG per tablet  Commonly known as:  NORCO  Take 1 tablet by mouth 2 (two) times daily as needed (pain).     levETIRAcetam 500 MG 24 hr tablet  Commonly known as:  KEPPRA XR  Take 2 tablets (1,000 mg total) by mouth daily.     linagliptin 5 MG Tabs tablet  Commonly known as:  TRADJENTA  Take 1 tablet (5 mg total) by mouth daily.     lisinopril 10 MG tablet  Commonly  known as:  PRINIVIL,ZESTRIL  Take 1 tablet (10 mg total) by mouth daily. FOR BLOOD PRESSURE     metFORMIN 500 MG tablet  Commonly known as:  GLUCOPHAGE  Take 2 tablets (1,000 mg total) by mouth daily with breakfast.  Start taking on:  08/22/2014     metoprolol succinate 50 MG 24 hr tablet  Commonly known as:  TOPROL-XL  Take 50 mg by mouth daily.     multivitamin with minerals Tabs tablet  Take 1 tablet by mouth daily.     nicotine 14 mg/24hr patch  Commonly known as:  NICODERM CQ - dosed in mg/24 hours  Place 1 patch (14 mg total) onto the skin daily.     nitroGLYCERIN 0.4 mg/hr patch  Commonly known as:  NITRODUR - Dosed in mg/24 hr  Place 1 patch onto the skin daily.     Omega 3 1200 MG Caps  Take 2,400 mg by mouth daily.     sertraline 50  MG tablet  Commonly known as:  ZOLOFT  Take 1 tablet (50 mg total) by mouth daily.     Vitamin D 2000 UNITS Caps  Take 1 capsule by mouth daily.       Allergies  Allergen Reactions  . Flexeril [Cyclobenzaprine]     delirium   Follow-up Information   Follow up with REED, TIFFANY, DO. Schedule an appointment as soon as possible for a visit in 1 week. [facility] followup)    Specialty:  Geriatric Medicine   Contact information:   [address] [place] [address] [phone]       Follow up with [facility]. Schedule an appointment as soon as possible for a visit in 1 month. [facility] followup, seizure)    Specialty:  Neurology   Contact information:   [address] [place] [address] [phone]       The results of significant diagnostics from this hospitalization (including imaging, microbiology, ancillary and laboratory) are listed below for reference.    Significant Diagnostic Studies: Ct Head Wo Contrast  08/19/2014   CLINICAL DATA:  Weakness. Dizziness. Possible seizure like activity.  EXAM: CT HEAD WITHOUT CONTRAST  TECHNIQUE: Contiguous axial images were obtained from the base of the skull through the vertex without intravenous contrast.  COMPARISON:  Head CT 01/20/2014.  FINDINGS: Mild cerebral and cerebellar atrophy. No acute intracranial abnormalities. Specifically, no evidence of acute intracranial hemorrhage, no definite findings of acute/subacute cerebral ischemia, no mass, mass effect, hydrocephalus or abnormal intra or extra-axial fluid collections. Visualized paranasal sinuses and mastoids are well pneumatized. No acute displaced skull fractures are identified. 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 The right globe is filled with high attenuation material, and partially calcified.  IMPRESSION: 1. No acute intracranial abnormalities. 2. Mild cerebral and cerebellar atrophy.   Electronically Signed   By:  Trudie Reed M.D.   On: 08/19/2014 13:15   Mr Eddie Graves Contrast  08/19/2014   CLINICAL DATA:  Left focal motor seizure  EXAM: MRI HEAD WITHOUT AND WITH CONTRAST  TECHNIQUE: Multiplanar, multiecho pulse sequences of the brain and surrounding structures were obtained without and with intravenous contrast.  CONTRAST:  25mL MULTIHANCE GADOBENATE DIMEGLUMINE 529 MG/ML IV SOLN  COMPARISON:  CT head 08/19/2014  FINDINGS: Chronic atrophy of the globe bilaterally.  Right eye prosthesis.  Mild atrophy.  Negative for hydrocephalus.  Negative for acute infarct.  Small area of chronic hemorrhage right parietal convexity. This is  likely an area of subarachnoid hemorrhage which is chronic, subarachnoid hemorrhage was noted in this area on 05/30/2013.  Small white matter hyperintensities in the frontal white matter consistent with chronic microvascular ischemia which is mild.  Postcontrast imaging reveals no enhancing mass lesion. Enhancing vessel in the right parietal lobe in the area of hemorrhage is asymmetric and slightly prominent. This could represent a small vascular malformation such is developmental venous anomaly which has bled in the past.  IMPRESSION: Negative for acute infarct.  Chronic subarachnoid hemorrhage on the right as noted on prior studies. Question underlying developmental venous anomaly in this area which has caused bleeding in the past. See prior CT and MRI 05/30/2013.   Electronically Signed   By: Marlan Palau M.D.   On: 08/19/2014 15:47    Microbiology: No results found for this or any previous visit (from the past 240 hour(s)).   Labs: Basic Metabolic Panel:  Recent Labs Lab 08/19/14 1210 08/19/14 1715 08/20/14 1235  NA 140  --  140  K 4.2  --  4.2  CL 101  --  101  CO2 24  --  27  GLUCOSE 215*  --  169*  BUN 15  --  10  CREATININE 1.07 1.04 0.97  CALCIUM 9.3  --  9.9   Liver Function Tests:  Recent Labs Lab 08/19/14 1210 08/19/14 1715 08/20/14 1235  AST 32 23 30   ALT 19 17 20   ALKPHOS 86 86 91  BILITOT 0.5 0.4 0.6  PROT 6.9 6.5 7.4  ALBUMIN 3.4* 3.2* 3.7   No results found for this basename: LIPASE, AMYLASE,  in the last 168 hours No results found for this basename: AMMONIA,  in the last 168 hours CBC:  Recent Labs Lab 08/19/14 1210 08/19/14 1715 08/20/14 1235  WBC 7.3 6.0 6.5  NEUTROABS 3.9  --   --   HGB 12.6* 12.2* 12.8*  HCT 37.2* 36.7* 38.9*  MCV 89.6 92.4 89.8  PLT 193 172 192   Cardiac Enzymes:  Recent Labs Lab 08/19/14 1715 08/19/14 2245 08/20/14 1235  TROPONINI <0.30 <0.30 <0.30   BNP: BNP (last 3 results) No results found for this basename: PROBNP,  in the last 8760 hours CBG:  Recent Labs Lab 08/19/14 1203  GLUCAP 181*       Signed:  Jahmir Salo  [facility] 08/20/2014, 1:59 PM

## 2014-08-20 NOTE — Progress Notes (Signed)
EEG completed, results pending. 

## 2014-08-20 NOTE — Progress Notes (Signed)
Inpatient Diabetes Program Recommendations  AACE/ADA: New Consensus Statement on Inpatient Glycemic Control (2013)  Target Ranges:  Prepandial:   less than 140 mg/dL      Peak postprandial:   less than 180 mg/dL (1-2 hours)      Critically ill patients:  140 - 180 mg/dL  Results for BALDO, HUFNAGLE (MRN [id number]) as of 08/20/2014 13:55  Ref. Range 08/19/2014 12:03  Glucose-Capillary Latest Range: 70-99 mg/dL 419 (H)   Inpatient Diabetes Program Recommendations Correction (SSI): add Novolog moderate scale TID per Glycemic Control order set Thank you  Piedad Climes BSN, RN,CDE Inpatient Diabetes Coordinator [phone] (team pager)

## 2014-08-20 NOTE — Progress Notes (Addendum)
Subjective: No further episodes noted.   Objective: Current vital signs: BP 168/88  Pulse 84  Temp(Src) 98 F (36.7 C) (Oral)  Resp 18  Ht 5\' 6"  (1.676 m)  Wt 95.255 kg (210 lb)  BMI 33.91 kg/m2  SpO2 98% Vital signs in last 24 hours: Temp:  [97.8 F (36.6 C)-98.7 F (37.1 C)] 98 F (36.7 C) (09/14 1000) Pulse Rate:  [66-84] 84 (09/14 1000) Resp:  [15-24] 18 (09/14 1000) BP: (113-168)/(65-88) 168/88 mmHg (09/14 1000) SpO2:  [96 %-100 %] 98 % (09/14 1000) Weight:  [95.255 kg (210 lb)] 95.255 kg (210 lb) (09/13 1100)  Intake/Output from previous day:   Intake/Output this shift: Total I/O In: 240 [P.O.:240] Out: -  Nutritional status: Cardiac  Neurologic Exam: General: NAD Mental Status: Alert, oriented, thought content appropriate.  Speech fluent without evidence of aphasia.  Able to follow 3 step commands without difficulty. Cranial Nerves: II: Discs flat bilaterally; blind, pupils unable to visualize,  Left sclera is scared and right is also scared.   III,IV, VI: ptosis not present,right eye does not move left or right.  Left eye does not move laterally V,VII: smile symmetric, facial light touch sensation normal bilaterally VIII: hearing normal bilaterally IX,X: gag reflex present XI: bilateral shoulder shrug XII: midline tongue extension without atrophy or fasciculations  Motor: Right : Upper extremity   5/5    Left:     Upper extremity   5/5  Lower extremity   5/5     Lower extremity   5/5 Tone and bulk:normal tone throughout; no atrophy noted Sensory: Pinprick and light touch intact throughout, bilaterally Deep Tendon Reflexes:  Right: Upper Extremity   Left: Upper extremity   biceps (C-5 to C-6) 2/4   biceps (C-5 to C-6) 2/4 tricep (C7) 2/4    triceps (C7) 2/4 Brachioradialis (C6) 2/4  Brachioradialis (C6) 2/4  Lower Extremity Lower Extremity  quadriceps (L-2 to L-4) 2/4   quadriceps (L-2 to L-4) 2/4 Achilles (S1) 1/4   Achilles (S1)  1/4  Plantars: Mute bilaterally  Lab Results: Basic Metabolic Panel:  Recent Labs Lab 08/19/14 1210 08/19/14 1715  NA 140  --   K 4.2  --   CL 101  --   CO2 24  --   GLUCOSE 215*  --   BUN 15  --   CREATININE 1.07 1.04  CALCIUM 9.3  --     Liver Function Tests:  Recent Labs Lab 08/19/14 1210 08/19/14 1715  AST 32 23  ALT 19 17  ALKPHOS 86 86  BILITOT 0.5 0.4  PROT 6.9 6.5  ALBUMIN 3.4* 3.2*   No results found for this basename: LIPASE, AMYLASE,  in the last 168 hours No results found for this basename: AMMONIA,  in the last 168 hours  CBC:  Recent Labs Lab 08/19/14 1210 08/19/14 1715  WBC 7.3 6.0  NEUTROABS 3.9  --   HGB 12.6* 12.2*  HCT 37.2* 36.7*  MCV 89.6 92.4  PLT 193 172    Cardiac Enzymes:  Recent Labs Lab 08/19/14 1715 08/19/14 2245  TROPONINI <0.30 <0.30    Lipid Panel: No results found for this basename: CHOL, TRIG, HDL, CHOLHDL, VLDL, LDLCALC,  in the last 168 hours  CBG:  Recent Labs Lab 08/19/14 1203  GLUCAP 181*    Microbiology: Results for orders placed in visit on 06/20/13  FECAL FAT, QUALITATIVE     Status: None   Collection Time    06/20/13  2:58 PM  Result Value Ref Range Status   Fat Qual Neutral, Stl  Normal   Final   Comment:                                Normal (<60 Droplets/HPF)   Fat Qual Total, Stl  Normal   Final   Comment:                               Normal (<100 Droplets/HPF)    Coagulation Studies: No results found for this basename: LABPROT, INR,  in the last 72 hours  Imaging: Ct Head Wo Contrast  08/19/2014   CLINICAL DATA:  Weakness. Dizziness. Possible seizure like activity.  EXAM: CT HEAD WITHOUT CONTRAST  TECHNIQUE: Contiguous axial images were obtained from the base of the skull through the vertex without intravenous contrast.  COMPARISON:  Head CT 01/20/2014.  FINDINGS: Mild cerebral and cerebellar atrophy. No acute intracranial abnormalities. Specifically, no evidence of acute  intracranial hemorrhage, no definite findings of acute/subacute cerebral ischemia, no mass, mass effect, hydrocephalus or abnormal intra or extra-axial fluid collections. Visualized paranasal sinuses and mastoids are well pneumatized. No acute displaced skull fractures are identified. 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 The right globe is filled with high attenuation material, and partially calcified.  IMPRESSION: 1. No acute intracranial abnormalities. 2. Mild cerebral and cerebellar atrophy.   Electronically Signed   By: Trudie Reed M.D.   On: 08/19/2014 13:15   Mr Laqueta Jean ER Contrast  08/19/2014   CLINICAL DATA:  Left focal motor seizure  EXAM: MRI HEAD WITHOUT AND WITH CONTRAST  TECHNIQUE: Multiplanar, multiecho pulse sequences of the brain and surrounding structures were obtained without and with intravenous contrast.  CONTRAST:  64mL MULTIHANCE GADOBENATE DIMEGLUMINE 529 MG/ML IV SOLN  COMPARISON:  CT head 08/19/2014  FINDINGS: Chronic atrophy of the globe bilaterally.  Right eye prosthesis.  Mild atrophy.  Negative for hydrocephalus.  Negative for acute infarct.  Small area of chronic hemorrhage right parietal convexity. This is likely an area of subarachnoid hemorrhage which is chronic, subarachnoid hemorrhage was noted in this area on 05/30/2013.  Small white matter hyperintensities in the frontal white matter consistent with chronic microvascular ischemia which is mild.  Postcontrast imaging reveals no enhancing mass lesion. Enhancing vessel in the right parietal lobe in the area of hemorrhage is asymmetric and slightly prominent. This could represent a small vascular malformation such is developmental venous anomaly which has bled in the past.  IMPRESSION: Negative for acute infarct.  Chronic subarachnoid hemorrhage on the right as noted on prior studies. Question underlying developmental venous anomaly in this area which has caused bleeding in the past. See prior CT  and MRI 05/30/2013.   Electronically Signed   By: Marlan Palau M.D.   On: 08/19/2014 15:47    Medications:  Scheduled: . amLODipine  2.5 mg Oral Daily  . atorvastatin  10 mg Oral q1800  . enoxaparin (LOVENOX) injection  40 mg Subcutaneous Q24H  . levETIRAcetam  1,000 mg Oral Daily  . linagliptin  5 mg Oral Daily  . lisinopril  10 mg Oral Daily  . metoprolol succinate  50 mg Oral Daily  . nicotine  14 mg Transdermal Daily  . pantoprazole  80 mg Oral Q breakfast  . sertraline  50 mg Oral Daily  . sodium chloride  3  mL Intravenous Q12H    Assessment/Plan:  80 y/o with new onset paroxysmal jerking movements of left arm without associated alteration of consciousness concerning for a left focal motor seizure. Neuro-exam non focal.  MRI brain revealed small area of chronic hemorrhage right parietal convexity. This is likely an area of subarachnoid hemorrhage which is chronic subarachnoid hemorrhage also noted in this area on 05/30/2013. Will obtain a CT angio of head to evaluate for underlying intracranial arterial abnormality.  If negative would recommend continue Keppra at current dose and have follow up with out patient neurology.    Plan discussed with Dr. Jeffie Pollock PA-C [facility] [phone]  08/20/2014, 10:43 AM  Addendum: CTA brain did not show evidence of arterial aneurysm. Possible small bit of recurrent subarachnoid right posterior frontal sulcus near the vertex. Additionally, I think there is a small amount of contrast enhancement, suggesting that there is probably a venous anomaly in this location which may be responsible for the recurrent hemorrhage.  I am not recommending further neuro-imaging at this moment as it will not change medical management. EEG is normal. There was a 3-second episode of left hand movements noted above with no EEG correlate, can not exclude partial seizures with negative scalp correlate. Will be in favor of discharging  patient on keppra with further neurology follow up in 2 weeks or before if his paroxysmal left arm movements are not controlled on current dose keppra. Will sign off.  Wyatt Portela, MD

## 2014-08-20 NOTE — Progress Notes (Signed)
1555 - Discharge instructions given to pt and pt's family members who were present at bedside. Pt verbally acknowledged understanding. Pt voiced no questions when prompted. Iv catheter removed by nurse without difficulty, intact.  Pt tolerated well. Pt's family member, son, to drive pt home by private car. Pt to be trasnported to lobby by wheelchair. By nurse tech. Will monitor   Zendayah Hardgrave I 08/20/2014. 4:06 PM

## 2014-08-20 NOTE — Procedures (Signed)
ELECTROENCEPHALOGRAM REPORT  Date of Study: 08/20/2014  Patient's Name: Eddie Graves MRN: [id number] Date of Birth: 05/28/1931  Referring Provider: Dr. Wyatt Portela  Clinical History: This is an 80 year old man with new onset jerking of the left arm.   Medications: amLODipine (NORVASC) tablet 2.5 mg  atorvastatin (LIPITOR) tablet 10 mg  enoxaparin (LOVENOX) injection 40 mg  levETIRAcetam (KEPPRA XR) 24 hr tablet 1,000 mg  linagliptin (TRADJENTA) tablet 5 mg  lisinopril (PRINIVIL,ZESTRIL) tablet 10 mg  metoprolol succinate (TOPROL-XL) 24 hr tablet 50 mg  pantoprazole (PROTONIX) EC tablet 80 mg  sertraline (ZOLOFT) tablet 50 mg   Technical Summary: A multichannel digital EEG recording measured by the international 10-20 system with electrodes applied with paste and impedances below 5000 ohms performed in our laboratory with EKG monitoring in an awake and asleep patient.  Hyperventilation was not performed.  Photic stimulation was performed.  The digital EEG was referentially recorded, reformatted, and digitally filtered in a variety of bipolar and referential montages for optimal display.    Description: The patient is awake and asleep during the recording.  During maximal wakefulness, there is a symmetric, low voltage 10 Hz posterior dominant rhythm that poorly attenuates to eye opening and eye closure. The record is symmetric.  During drowsiness and sleep, there is an increase in theta slowing of the background.  Vertex waves and symmetric sleep spindles were seen.  Photic stimulation did not elicit any abnormalities. At 00:19:46 minutes into the recording, he is noted to have a 3-second episode with rapid flexion-extension of the middle fingers of his left hand, with no associated EEG correlate. There were no epileptiform discharges or electrographic seizures seen.    EKG lead showed sinus rhythm with frequent PVCs.  Impression: This awake and asleep EEG is normal.  There was a  3-second episode of left hand movements noted above with no EEG correlate.    Clinical Correlation: A normal EEG does not exclude a clinical diagnosis of epilepsy. Episode captured above did not show EEG change. Simple partial seizures may have negative scalp correlate, however clinically the episode did not appear epileptic. More typical episodes of uncontrollable left arm jerking were not captured.  Clinical correlation is advised.   Patrcia Dolly, M.D.

## 2014-08-21 NOTE — Care Management Note (Unsigned)
    Page 1 of 1   08/21/2014     8:15:20 AM CARE MANAGEMENT NOTE 08/21/2014  Patient:  [employer]   Account Number:  [id number]  Date Initiated:  08/21/2014  Documentation initiated by:  [name]  Subjective/Objective Assessment:   Patient was admitted with new onset focal seizure, abnormal arm movements. Lives at home with children.     Action/Plan:   Will follow for discharge needs pending PT/OT evals and physician orders.   Anticipated DC Date:  08/20/2014   Anticipated DC Plan:  HOME/SELF CARE         Choice offered to / List presented to:             Status of service:  Completed, signed off Medicare Important Message given?  NA - LOS <3 / Initial given by admissions (If response is "NO", the following Medicare IM given date fields will be blank) Date Medicare IM given:   Medicare IM given by:   Date Additional Medicare IM given:   Additional Medicare IM given by:    Discharge Disposition:    Per UR Regulation:  Reviewed for med. necessity/level of care/duration of stay  If discussed at Long Length of Stay Meetings, dates discussed:    Comments:

## 2014-08-21 NOTE — Progress Notes (Signed)
UR complete.  Damyah Gugel RN, MSN 

## 2014-08-27 ENCOUNTER — Encounter: Payer: Self-pay | Admitting: Internal Medicine

## 2014-08-27 ENCOUNTER — Ambulatory Visit (INDEPENDENT_AMBULATORY_CARE_PROVIDER_SITE_OTHER): Payer: Medicare Other | Admitting: Internal Medicine

## 2014-08-27 VITALS — BP 148/96 | HR 78 | Temp 98.0°F | Wt 209.0 lb

## 2014-08-27 DIAGNOSIS — I25119 Atherosclerotic heart disease of native coronary artery with unspecified angina pectoris: Secondary | ICD-10-CM

## 2014-08-27 DIAGNOSIS — E1142 Type 2 diabetes mellitus with diabetic polyneuropathy: Secondary | ICD-10-CM

## 2014-08-27 DIAGNOSIS — E1149 Type 2 diabetes mellitus with other diabetic neurological complication: Secondary | ICD-10-CM

## 2014-08-27 DIAGNOSIS — R569 Unspecified convulsions: Secondary | ICD-10-CM

## 2014-08-27 DIAGNOSIS — J4489 Other specified chronic obstructive pulmonary disease: Secondary | ICD-10-CM

## 2014-08-27 DIAGNOSIS — Z23 Encounter for immunization: Secondary | ICD-10-CM

## 2014-08-27 DIAGNOSIS — I609 Nontraumatic subarachnoid hemorrhage, unspecified: Secondary | ICD-10-CM

## 2014-08-27 DIAGNOSIS — IMO0001 Reserved for inherently not codable concepts without codable children: Secondary | ICD-10-CM

## 2014-08-27 DIAGNOSIS — I2510 Atherosclerotic heart disease of native coronary artery without angina pectoris: Secondary | ICD-10-CM

## 2014-08-27 DIAGNOSIS — J449 Chronic obstructive pulmonary disease, unspecified: Secondary | ICD-10-CM

## 2014-08-27 DIAGNOSIS — I209 Angina pectoris, unspecified: Secondary | ICD-10-CM

## 2014-08-27 DIAGNOSIS — M48062 Spinal stenosis, lumbar region with neurogenic claudication: Secondary | ICD-10-CM

## 2014-08-27 NOTE — Patient Instructions (Signed)
Please make follow up appointment with [facility] about left arm/seizures. Take new keppra prescription for left arm.

## 2014-08-27 NOTE — Progress Notes (Signed)
Patient ID: Eddie Graves, male   DOB: 1931-07-17, 80 y.o.   MRN: [id number]   Location:  [facility]  Code Status: full code  Allergies  Allergen Reactions  . Flexeril [Cyclobenzaprine]     delirium    Chief Complaint  Patient presents with  . Hospitalization Follow-up    Seen in ER on 08/19/14   . Nevus    Examine mole on left arm, seen dermatologist before     HPI: Patient is a 80 y.o. black male seen in the office today for hospital f/u.    Dr. Montez Morita gave him braces for his wrists pt says for his diabetic neuropathy.  Symptoms began after two days wearing the braces.    Pt was in hospital 9/14 due to tremor of left arm that was thought to be possibly be seizures, but EEG negative.  Pain starts in muscle of upper arm and radiates down forearm.  Could not control it.  Was numb when he would try to grab it with the other arm.  Says it still feels numb on his left forearm.  Was given a dose of keppra 1000mg  shot at the hospital and to f/u with neurology.  Has not had any episodes as bad as the ones he had before (4).  Only pain yesterday in his forearm, but no flopping around of loss of control of the arm.    Had a pain in his abdomen right beneath his belly, and it went down to his groin.  Was real bad when he woke up and most of day, then got better before the day he went to the hospital.   Doesn't recall bowel situation for the day--says he has either constipation or diarrhea all of the time.  Review of Systems:  Review of Systems  Constitutional: Negative for fever.  Eyes: Negative for blurred vision.  Respiratory: Negative for shortness of breath.   Cardiovascular: Negative for chest pain and leg swelling.  Gastrointestinal: Positive for abdominal pain, diarrhea and constipation. Negative for blood in stool and melena.  Genitourinary: Negative for dysuria.  Musculoskeletal: Positive for myalgias.       Left arm pain  Skin: Negative  for rash.  Neurological: Positive for sensory change and headaches. Negative for tingling and weakness.  Psychiatric/Behavioral: Positive for depression and memory loss.    Past Medical History  Diagnosis Date  . DM (diabetes mellitus) type II controlled peripheral vascular disorder   . Essential hypertension, benign   . Blind   . Headache(784.0)   . Sebaceous cyst   . Chronic airway obstruction, not elsewhere classified   . Cough   . Spinal stenosis, lumbar region, with neurogenic claudication   . Coronary atherosclerosis of native coronary artery   . Vitamin D deficiency   . Rash and other nonspecific skin eruption   . Other and unspecified hyperlipidemia   . Anxiety state, unspecified   . Depressive disorder, not elsewhere classified   . Legal blindness, as defined in [place]   . Reflux esophagitis   . Unspecified constipation   . Pain in joint, site unspecified   . Memory loss   . Palpitations   . Type II diabetes mellitus with neurological manifestations, uncontrolled   . H/O hiatal hernia     Past Surgical History  Procedure Laterality Date  . Coronary angioplasty with stent placement    . Hernia repair    . Eye surgery      Social  History:   reports that he quit smoking about 17 months ago. His smoking use included Cigarettes. He has a 50 pack-year smoking history. He has never used smokeless tobacco. He reports that he does not drink alcohol or use illicit drugs.  Family History  Problem Relation Age of Onset  . Asthma Mother   . Diabetes Son   . Cancer Daughter   . Diabetes Son     Medications: Patient's Medications  New Prescriptions   No medications on file  Previous Medications   ACETAMINOPHEN (TYLENOL) 500 MG TABLET    Take 500 mg by mouth every 8 (eight) hours as needed for pain.    ALBUTEROL (PROVENTIL HFA;VENTOLIN HFA) 108 (90 BASE) MCG/ACT INHALER    Inhale 2 puffs into the lungs every 2 (two) hours as needed for wheezing or shortness of breath.    AMLODIPINE (NORVASC) 2.5 MG TABLET    Take 2.5 mg by mouth daily.   CHOLECALCIFEROL (VITAMIN D) 2000 UNITS CAPS    Take 1 capsule by mouth daily.   CRESTOR 20 MG TABLET    Take 20 mg by mouth daily.   ESOMEPRAZOLE (NEXIUM) 40 MG PACKET    Take 40 mg by mouth daily before breakfast.   ETODOLAC (LODINE) 400 MG TABLET    Take 400 mg by mouth 2 (two) times daily.   GARLIC PO    Take 75 mg by mouth daily.    GLUCOSE BLOOD TEST STRIP    Check blood sugar 2 x daily   HYDROCODONE-ACETAMINOPHEN (NORCO) 10-325 MG PER TABLET    Take 1 tablet by mouth 2 (two) times daily as needed (pain).   LEVETIRACETAM (KEPPRA XR) 500 MG 24 HR TABLET    Take 2 tablets (1,000 mg total) by mouth daily.   LINAGLIPTIN (TRADJENTA) 5 MG TABS TABLET    Take 1 tablet (5 mg total) by mouth daily.   LISINOPRIL (PRINIVIL,ZESTRIL) 10 MG TABLET    Take 1 tablet (10 mg total) by mouth daily. FOR BLOOD PRESSURE   METFORMIN (GLUCOPHAGE) 500 MG TABLET    Take 2 tablets (1,000 mg total) by mouth daily with breakfast.   METOPROLOL SUCCINATE (TOPROL-XL) 50 MG 24 HR TABLET    Take 50 mg by mouth daily.    MULTIPLE VITAMIN (MULITIVITAMIN WITH MINERALS) TABS    Take 1 tablet by mouth daily.   NICOTINE (NICODERM CQ - DOSED IN MG/24 HOURS) 14 MG/24HR PATCH    Place 1 patch (14 mg total) onto the skin daily.   NITROGLYCERIN (NITRODUR - DOSED IN MG/24 HR) 0.4 MG/HR    Place 1 patch onto the skin daily.   OMEGA 3 1200 MG CAPS    Take 2,400 mg by mouth daily.    SERTRALINE (ZOLOFT) 50 MG TABLET    Take 1 tablet (50 mg total) by mouth daily.  Modified Medications   No medications on file  Discontinued Medications   No medications on file     Physical Exam: Filed Vitals:   08/27/14 1207  BP: 148/96  Pulse: 78  Temp: 98 F (36.7 C)  TempSrc: Oral  Weight: 209 lb (94.802 kg)  SpO2: 99%  Physical Exam  Constitutional: He appears well-developed and well-nourished. No distress.  Eyes:  blind  Cardiovascular: Normal rate, regular rhythm,  normal heart sounds and intact distal pulses.   Pulmonary/Chest: Effort normal and breath sounds normal. No respiratory distress.  Abdominal: Soft. Bowel sounds are normal. He exhibits no distension and no mass. There is  no tenderness.  Musculoskeletal: Normal range of motion.  Neurological: He is alert.  Skin: Skin is warm and dry.    Labs reviewed: Basic Metabolic Panel:  Recent Labs  06/01/93 0947 08/19/14 1210 08/19/14 1715 08/20/14 1235  NA 138 140  --  140  K 4.6 4.2  --  4.2  CL 98 101  --  101  CO2 25 24  --  27  GLUCOSE 180* 215*  --  169*  BUN 14 15  --  10  CREATININE 1.14 1.07 1.04 0.97  CALCIUM 9.6 9.3  --  9.9  TSH  --   --  0.728  --    Liver Function Tests:  Recent Labs  08/19/14 1210 08/19/14 1715 08/20/14 1235  AST 32 23 30  ALT 19 17 20   ALKPHOS 86 86 91  BILITOT 0.5 0.4 0.6  PROT 6.9 6.5 7.4  ALBUMIN 3.4* 3.2* 3.7  CBC:  Recent Labs  04/26/14 1408 07/26/14 0947 08/19/14 1210 08/19/14 1715 08/20/14 1235  WBC 7.0 6.4 7.3 6.0 6.5  NEUTROABS 4.2 3.2 3.9  --   --   HGB 14.0 13.9 12.6* 12.2* 12.8*  HCT 42.5 41.6 37.2* 36.7* 38.9*  MCV 91 90 89.6 92.4 89.8  PLT 197 185 193 172 192   Lipid Panel:  Recent Labs  07/26/14 0947  HDL 43  LDLCALC 85  TRIG 120  CHOLHDL 3.5   Lab Results  Component Value Date   HGBA1C 8.6* 07/26/2014     Assessment/Plan 1. Focal seizure -it is unclear if his keppra has been started--he thinks he picked it up and it is setting on the dresser--needs to start -had another event yesterday--encouraged to take this  2. SAH (subarachnoid hemorrhage) -could be cause of focal seizure as this is the same area affected in terms of his presentation of stroke  3. Diabetic peripheral neuropathy associated with type 2 diabetes mellitus -last hba1c above goal considering his chronic diseases -[facility]  4. Atherosclerosis of native coronary artery of native heart with angina  pectoris -getting improved parameters slowly after seeing me for 4 years now  5. COPD bronchitis -no acute exacerbatoin, doing well  6. Spinal stenosis, lumbar region, with neurogenic claudication -back doing better lately  Labs/tests ordered:  None today, f/u with neurology Next appt:  Keep appt 12/24

## 2014-09-06 LAB — HM DIABETES EYE EXAM

## 2014-09-10 ENCOUNTER — Ambulatory Visit
Admission: RE | Admit: 2014-09-10 | Discharge: 2014-09-10 | Disposition: A | Discharge status: Home / Self Care | Payer: Medicare Other | Source: Ambulatory Visit | Attending: Orthopedic Surgery | Admitting: Orthopedic Surgery

## 2014-09-10 ENCOUNTER — Other Ambulatory Visit: Payer: Self-pay | Admitting: Orthopedic Surgery

## 2014-09-10 DIAGNOSIS — M5430 Sciatica, unspecified side: Secondary | ICD-10-CM

## 2014-09-10 DIAGNOSIS — M5412 Radiculopathy, cervical region: Secondary | ICD-10-CM

## 2014-09-28 ENCOUNTER — Ambulatory Visit (INDEPENDENT_AMBULATORY_CARE_PROVIDER_SITE_OTHER): Payer: Medicare Other | Admitting: Internal Medicine

## 2014-09-28 ENCOUNTER — Encounter: Payer: Self-pay | Admitting: Internal Medicine

## 2014-09-28 VITALS — BP 130/80 | HR 81 | Temp 98.0°F | Wt 210.0 lb

## 2014-09-28 DIAGNOSIS — E1149 Type 2 diabetes mellitus with other diabetic neurological complication: Secondary | ICD-10-CM

## 2014-09-28 DIAGNOSIS — M5432 Sciatica, left side: Secondary | ICD-10-CM

## 2014-09-28 DIAGNOSIS — IMO0001 Reserved for inherently not codable concepts without codable children: Secondary | ICD-10-CM

## 2014-09-28 DIAGNOSIS — J449 Chronic obstructive pulmonary disease, unspecified: Secondary | ICD-10-CM

## 2014-09-28 DIAGNOSIS — E1141 Type 2 diabetes mellitus with diabetic mononeuropathy: Secondary | ICD-10-CM

## 2014-09-28 DIAGNOSIS — M5030 Other cervical disc degeneration, unspecified cervical region: Secondary | ICD-10-CM

## 2014-09-28 DIAGNOSIS — M5136 Other intervertebral disc degeneration, lumbar region: Secondary | ICD-10-CM

## 2014-09-28 DIAGNOSIS — I25110 Atherosclerotic heart disease of native coronary artery with unstable angina pectoris: Secondary | ICD-10-CM

## 2014-09-28 DIAGNOSIS — E1165 Type 2 diabetes mellitus with hyperglycemia: Secondary | ICD-10-CM

## 2014-09-28 DIAGNOSIS — Z23 Encounter for immunization: Secondary | ICD-10-CM

## 2014-09-28 DIAGNOSIS — M5387 Other specified dorsopathies, lumbosacral region: Secondary | ICD-10-CM

## 2014-09-28 DIAGNOSIS — IMO0002 Reserved for concepts with insufficient information to code with codable children: Secondary | ICD-10-CM

## 2014-09-28 MED ORDER — HYDROCODONE-ACETAMINOPHEN 10-325 MG PO TABS: 1.0000 | ORAL_TABLET | Freq: Four times a day (QID) | ORAL | Status: DC | PRN
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
## 2014-10-02 ENCOUNTER — Other Ambulatory Visit: Payer: Self-pay | Admitting: Orthopedic Surgery

## 2014-10-02 DIAGNOSIS — M5412 Radiculopathy, cervical region: Secondary | ICD-10-CM

## 2014-10-04 ENCOUNTER — Ambulatory Visit
Admission: RE | Admit: 2014-10-04 | Discharge: 2014-10-04 | Disposition: A | Discharge status: Home / Self Care | Payer: Medicare Other | Source: Ambulatory Visit | Attending: Orthopedic Surgery | Admitting: Orthopedic Surgery

## 2014-10-04 VITALS — BP 122/67 | HR 77

## 2014-10-04 DIAGNOSIS — M48062 Spinal stenosis, lumbar region with neurogenic claudication: Secondary | ICD-10-CM

## 2014-10-04 DIAGNOSIS — M5412 Radiculopathy, cervical region: Secondary | ICD-10-CM

## 2014-10-04 MED ORDER — IOHEXOL 300 MG/ML  SOLN
1.0000 mL | Freq: Once | INTRAMUSCULAR | Status: AC | PRN
  Administered 2014-10-04: 1 mL via EPIDURAL

## 2014-10-04 MED ORDER — TRIAMCINOLONE ACETONIDE 40 MG/ML IJ SUSP (RADIOLOGY)
60.0000 mg | Freq: Once | INTRAMUSCULAR | Status: AC
  Administered 2014-10-04: 60 mg via EPIDURAL

## 2014-10-05 ENCOUNTER — Encounter: Payer: Self-pay | Admitting: *Deleted

## 2014-10-10 ENCOUNTER — Telehealth: Payer: Self-pay | Admitting: *Deleted

## 2014-10-10 NOTE — Telephone Encounter (Signed)
That was a good recommendation.  Thanks, Synetta Fail.  I am glad he scheduled an appointment.

## 2014-10-10 NOTE — Telephone Encounter (Signed)
Patient states that he is severly Depression. No suicide thoughts. He just not energetic and his speech is real slow and slurred and just acts ill. Been having a lot of family crisis going on lately that has added to the stress. Patient has scheduled an appointment for Monday to see you. They want to know of a service they can call now. Stark Bray, caregiver, phone number to [facility].

## 2014-10-10 NOTE — Telephone Encounter (Signed)
[name] called back and left message that the counselling center needed patient's medical records faxed to them at Fax # [phone] Called and spoke with patient and he gave me the verbal ok to fax the Medical Records. Medical Records faxed

## 2014-10-11 ENCOUNTER — Encounter (HOSPITAL_COMMUNITY): Payer: Self-pay | Admitting: Emergency Medicine

## 2014-10-11 ENCOUNTER — Inpatient Hospital Stay (HOSPITAL_COMMUNITY)
Admission: EM | Admit: 2014-10-11 | Discharge: 2014-10-15 | DRG: 638 | Disposition: A | Discharge status: Home Health | Payer: Medicare Other | Attending: Internal Medicine | Admitting: Internal Medicine

## 2014-10-11 DIAGNOSIS — H548 Legal blindness, as defined in USA: Secondary | ICD-10-CM | POA: Diagnosis present

## 2014-10-11 DIAGNOSIS — R7989 Other specified abnormal findings of blood chemistry: Secondary | ICD-10-CM

## 2014-10-11 DIAGNOSIS — Z955 Presence of coronary angioplasty implant and graft: Secondary | ICD-10-CM

## 2014-10-11 DIAGNOSIS — Z9114 Patient's other noncompliance with medication regimen: Secondary | ICD-10-CM | POA: Diagnosis present

## 2014-10-11 DIAGNOSIS — R51 Headache: Secondary | ICD-10-CM | POA: Diagnosis present

## 2014-10-11 DIAGNOSIS — E559 Vitamin D deficiency, unspecified: Secondary | ICD-10-CM | POA: Diagnosis present

## 2014-10-11 DIAGNOSIS — E875 Hyperkalemia: Secondary | ICD-10-CM | POA: Diagnosis not present

## 2014-10-11 DIAGNOSIS — E1110 Type 2 diabetes mellitus with ketoacidosis without coma: Secondary | ICD-10-CM | POA: Diagnosis present

## 2014-10-11 DIAGNOSIS — I2510 Atherosclerotic heart disease of native coronary artery without angina pectoris: Secondary | ICD-10-CM | POA: Diagnosis present

## 2014-10-11 DIAGNOSIS — J449 Chronic obstructive pulmonary disease, unspecified: Secondary | ICD-10-CM | POA: Diagnosis present

## 2014-10-11 DIAGNOSIS — R739 Hyperglycemia, unspecified: Secondary | ICD-10-CM

## 2014-10-11 DIAGNOSIS — N179 Acute kidney failure, unspecified: Secondary | ICD-10-CM | POA: Diagnosis present

## 2014-10-11 DIAGNOSIS — R569 Unspecified convulsions: Secondary | ICD-10-CM | POA: Diagnosis present

## 2014-10-11 DIAGNOSIS — R413 Other amnesia: Secondary | ICD-10-CM | POA: Diagnosis present

## 2014-10-11 DIAGNOSIS — F329 Major depressive disorder, single episode, unspecified: Secondary | ICD-10-CM | POA: Diagnosis present

## 2014-10-11 DIAGNOSIS — F17210 Nicotine dependence, cigarettes, uncomplicated: Secondary | ICD-10-CM | POA: Diagnosis present

## 2014-10-11 DIAGNOSIS — E1310 Other specified diabetes mellitus with ketoacidosis without coma: Principal | ICD-10-CM | POA: Diagnosis present

## 2014-10-11 DIAGNOSIS — E785 Hyperlipidemia, unspecified: Secondary | ICD-10-CM | POA: Diagnosis present

## 2014-10-11 DIAGNOSIS — E871 Hypo-osmolality and hyponatremia: Secondary | ICD-10-CM | POA: Diagnosis present

## 2014-10-11 DIAGNOSIS — M6240 Contracture of muscle, unspecified site: Secondary | ICD-10-CM | POA: Diagnosis present

## 2014-10-11 DIAGNOSIS — Z888 Allergy status to other drugs, medicaments and biological substances status: Secondary | ICD-10-CM

## 2014-10-11 DIAGNOSIS — R748 Abnormal levels of other serum enzymes: Secondary | ICD-10-CM | POA: Diagnosis present

## 2014-10-11 DIAGNOSIS — Z79899 Other long term (current) drug therapy: Secondary | ICD-10-CM

## 2014-10-11 DIAGNOSIS — H547 Unspecified visual loss: Secondary | ICD-10-CM | POA: Diagnosis present

## 2014-10-11 DIAGNOSIS — Z9111 Patient's noncompliance with dietary regimen: Secondary | ICD-10-CM | POA: Diagnosis present

## 2014-10-11 DIAGNOSIS — F419 Anxiety disorder, unspecified: Secondary | ICD-10-CM | POA: Diagnosis present

## 2014-10-11 DIAGNOSIS — E1142 Type 2 diabetes mellitus with diabetic polyneuropathy: Secondary | ICD-10-CM

## 2014-10-11 DIAGNOSIS — K219 Gastro-esophageal reflux disease without esophagitis: Secondary | ICD-10-CM | POA: Diagnosis present

## 2014-10-11 DIAGNOSIS — Z720 Tobacco use: Secondary | ICD-10-CM | POA: Diagnosis present

## 2014-10-11 DIAGNOSIS — R252 Cramp and spasm: Secondary | ICD-10-CM | POA: Diagnosis present

## 2014-10-11 DIAGNOSIS — M4806 Spinal stenosis, lumbar region: Secondary | ICD-10-CM | POA: Diagnosis present

## 2014-10-11 DIAGNOSIS — I10 Essential (primary) hypertension: Secondary | ICD-10-CM | POA: Diagnosis present

## 2014-10-11 LAB — CBC WITH DIFFERENTIAL/PLATELET
Basophils Absolute: 0 K/uL (ref 0.0–0.1)
Basophils Relative: 0 % (ref 0–1)
Eosinophils Absolute: 0.1 K/uL (ref 0.0–0.7)
Eosinophils Relative: 1 % (ref 0–5)
HCT: 39.5 % (ref 39.0–52.0)
Hemoglobin: 13.3 g/dL (ref 13.0–17.0)
Lymphocytes Relative: 20 % (ref 12–46)
Lymphs Abs: 1.7 K/uL (ref 0.7–4.0)
MCH: 30.4 pg (ref 26.0–34.0)
MCHC: 33.7 g/dL (ref 30.0–36.0)
MCV: 90.2 fL (ref 78.0–100.0)
Monocytes Absolute: 0.7 K/uL (ref 0.1–1.0)
Monocytes Relative: 8 % (ref 3–12)
Neutro Abs: 5.8 K/uL (ref 1.7–7.7)
Neutrophils Relative %: 71 % (ref 43–77)
Platelets: 191 K/uL (ref 150–400)
RBC: 4.38 MIL/uL (ref 4.22–5.81)
RDW: 12.5 % (ref 11.5–15.5)
WBC: 8.3 K/uL (ref 4.0–10.5)

## 2014-10-11 LAB — COMPREHENSIVE METABOLIC PANEL WITH GFR
ALT: 47 U/L (ref 0–53)
AST: 39 U/L — ABNORMAL HIGH (ref 0–37)
Albumin: 3.6 g/dL (ref 3.5–5.2)
Alkaline Phosphatase: 93 U/L (ref 39–117)
Anion gap: 17 — ABNORMAL HIGH (ref 5–15)
BUN: 51 mg/dL — ABNORMAL HIGH (ref 6–23)
CO2: 20 meq/L (ref 19–32)
Calcium: 9.7 mg/dL (ref 8.4–10.5)
Chloride: 84 meq/L — ABNORMAL LOW (ref 96–112)
Creatinine, Ser: 2.13 mg/dL — ABNORMAL HIGH (ref 0.50–1.35)
GFR calc Af Amer: 32 mL/min — ABNORMAL LOW
GFR calc non Af Amer: 27 mL/min — ABNORMAL LOW
Glucose, Bld: 769 mg/dL (ref 70–99)
Potassium: 5.6 meq/L — ABNORMAL HIGH (ref 3.7–5.3)
Sodium: 121 meq/L — CL (ref 137–147)
Total Bilirubin: 0.5 mg/dL (ref 0.3–1.2)
Total Protein: 7.3 g/dL (ref 6.0–8.3)

## 2014-10-11 NOTE — ED Notes (Signed)
CRITICAL VALUE ALERT  Critical value received:  Sodium 121  Date of notification:  10/11/2014  Time of notification:  2253  Critical value read back:Yes.    Nurse who received alert:  MG Jenelle Mages, RN  MD notified (1st page):  [phone]  Time of first page:  2357  MD notified (2nd page):  Time of second page:  Responding MD:  Dr. Effie Shy  Time MD responded:  [phone]

## 2014-10-11 NOTE — ED Notes (Signed)
CRITICAL VALUE ALERT  Critical value received:  Glucose 769  Date of notification:  10/11/2014  Time of notification:  2256  Critical value read back:Yes.    Nurse who received alert:  MG Karell Tukes, RN  MD notified (1st page):  [phone]  Time of first page:  2257  MD notified (2nd page):  Time of second page:  Responding MD: Dr. Effie Shy  Time MD responded:  2257

## 2014-10-11 NOTE — ED Notes (Signed)
Pt. reports intermittent left arm and left leg tremors / spasms onset yesterday.

## 2014-10-12 ENCOUNTER — Inpatient Hospital Stay (HOSPITAL_COMMUNITY): Payer: Medicare Other

## 2014-10-12 DIAGNOSIS — Z888 Allergy status to other drugs, medicaments and biological substances status: Secondary | ICD-10-CM | POA: Diagnosis not present

## 2014-10-12 DIAGNOSIS — E1110 Type 2 diabetes mellitus with ketoacidosis without coma: Secondary | ICD-10-CM | POA: Diagnosis present

## 2014-10-12 DIAGNOSIS — E559 Vitamin D deficiency, unspecified: Secondary | ICD-10-CM | POA: Diagnosis present

## 2014-10-12 DIAGNOSIS — M4806 Spinal stenosis, lumbar region: Secondary | ICD-10-CM | POA: Diagnosis present

## 2014-10-12 DIAGNOSIS — H540 Blindness, both eyes: Secondary | ICD-10-CM

## 2014-10-12 DIAGNOSIS — H548 Legal blindness, as defined in USA: Secondary | ICD-10-CM | POA: Diagnosis present

## 2014-10-12 DIAGNOSIS — I10 Essential (primary) hypertension: Secondary | ICD-10-CM | POA: Diagnosis present

## 2014-10-12 DIAGNOSIS — Z955 Presence of coronary angioplasty implant and graft: Secondary | ICD-10-CM | POA: Diagnosis not present

## 2014-10-12 DIAGNOSIS — K219 Gastro-esophageal reflux disease without esophagitis: Secondary | ICD-10-CM | POA: Diagnosis present

## 2014-10-12 DIAGNOSIS — N179 Acute kidney failure, unspecified: Secondary | ICD-10-CM | POA: Diagnosis present

## 2014-10-12 DIAGNOSIS — R569 Unspecified convulsions: Secondary | ICD-10-CM | POA: Diagnosis present

## 2014-10-12 DIAGNOSIS — F329 Major depressive disorder, single episode, unspecified: Secondary | ICD-10-CM | POA: Diagnosis present

## 2014-10-12 DIAGNOSIS — E1142 Type 2 diabetes mellitus with diabetic polyneuropathy: Secondary | ICD-10-CM

## 2014-10-12 DIAGNOSIS — E1310 Other specified diabetes mellitus with ketoacidosis without coma: Secondary | ICD-10-CM | POA: Diagnosis present

## 2014-10-12 DIAGNOSIS — R51 Headache: Secondary | ICD-10-CM | POA: Diagnosis present

## 2014-10-12 DIAGNOSIS — I2510 Atherosclerotic heart disease of native coronary artery without angina pectoris: Secondary | ICD-10-CM | POA: Diagnosis present

## 2014-10-12 DIAGNOSIS — R739 Hyperglycemia, unspecified: Secondary | ICD-10-CM | POA: Diagnosis present

## 2014-10-12 DIAGNOSIS — Z79899 Other long term (current) drug therapy: Secondary | ICD-10-CM | POA: Diagnosis not present

## 2014-10-12 DIAGNOSIS — Z9111 Patient's noncompliance with dietary regimen: Secondary | ICD-10-CM | POA: Diagnosis present

## 2014-10-12 DIAGNOSIS — R748 Abnormal levels of other serum enzymes: Secondary | ICD-10-CM | POA: Diagnosis present

## 2014-10-12 DIAGNOSIS — E875 Hyperkalemia: Secondary | ICD-10-CM | POA: Diagnosis not present

## 2014-10-12 DIAGNOSIS — R252 Cramp and spasm: Secondary | ICD-10-CM | POA: Diagnosis present

## 2014-10-12 DIAGNOSIS — Z9114 Patient's other noncompliance with medication regimen: Secondary | ICD-10-CM | POA: Diagnosis present

## 2014-10-12 DIAGNOSIS — Z720 Tobacco use: Secondary | ICD-10-CM

## 2014-10-12 DIAGNOSIS — G629 Polyneuropathy, unspecified: Secondary | ICD-10-CM

## 2014-10-12 DIAGNOSIS — F17210 Nicotine dependence, cigarettes, uncomplicated: Secondary | ICD-10-CM | POA: Diagnosis present

## 2014-10-12 DIAGNOSIS — M6240 Contracture of muscle, unspecified site: Secondary | ICD-10-CM | POA: Diagnosis present

## 2014-10-12 DIAGNOSIS — F419 Anxiety disorder, unspecified: Secondary | ICD-10-CM | POA: Diagnosis present

## 2014-10-12 DIAGNOSIS — M6249 Contracture of muscle, multiple sites: Secondary | ICD-10-CM

## 2014-10-12 DIAGNOSIS — R413 Other amnesia: Secondary | ICD-10-CM | POA: Diagnosis present

## 2014-10-12 DIAGNOSIS — J449 Chronic obstructive pulmonary disease, unspecified: Secondary | ICD-10-CM

## 2014-10-12 DIAGNOSIS — E785 Hyperlipidemia, unspecified: Secondary | ICD-10-CM | POA: Diagnosis present

## 2014-10-12 DIAGNOSIS — E871 Hypo-osmolality and hyponatremia: Secondary | ICD-10-CM | POA: Diagnosis present

## 2014-10-12 LAB — BASIC METABOLIC PANEL
Anion gap: 16 — ABNORMAL HIGH (ref 5–15)
Anion gap: 14 (ref 5–15)
Anion gap: 15 (ref 5–15)
Anion gap: 14 (ref 5–15)
BUN: 47 mg/dL — ABNORMAL HIGH (ref 6–23)
BUN: 48 mg/dL — ABNORMAL HIGH (ref 6–23)
BUN: 44 mg/dL — ABNORMAL HIGH (ref 6–23)
BUN: 44 mg/dL — ABNORMAL HIGH (ref 6–23)
CALCIUM: 9.8 mg/dL (ref 8.4–10.5)
CALCIUM: 9.7 mg/dL (ref 8.4–10.5)
CALCIUM: 9.7 mg/dL (ref 8.4–10.5)
CO2: 20 mEq/L (ref 19–32)
CO2: 22 mEq/L (ref 19–32)
CO2: 21 meq/L (ref 19–32)
CO2: 21 meq/L (ref 19–32)
CREATININE: 1.62 mg/dL — AB (ref 0.50–1.35)
CREATININE: 1.44 mg/dL — AB (ref 0.50–1.35)
Calcium: 9.8 mg/dL (ref 8.4–10.5)
Chloride: 95 mEq/L — ABNORMAL LOW (ref 96–112)
Chloride: 96 mEq/L (ref 96–112)
Chloride: 98 mEq/L (ref 96–112)
Chloride: 95 mEq/L — ABNORMAL LOW (ref 96–112)
Creatinine, Ser: 1.75 mg/dL — ABNORMAL HIGH (ref 0.50–1.35)
Creatinine, Ser: 1.72 mg/dL — ABNORMAL HIGH (ref 0.50–1.35)
GFR calc Af Amer: 41 mL/min — ABNORMAL LOW (ref >90)
GFR calc Af Amer: 51 mL/min — ABNORMAL LOW (ref >90)
GFR calc non Af Amer: 35 mL/min — ABNORMAL LOW (ref >90)
GFR calc non Af Amer: 36 mL/min — ABNORMAL LOW (ref >90)
GFR calc non Af Amer: 38 mL/min — ABNORMAL LOW (ref >90)
GFR calc non Af Amer: 44 mL/min — ABNORMAL LOW (ref >90)
GFR, EST AFRICAN AMERICAN: 40 mL/min — AB (ref >90)
GFR, EST AFRICAN AMERICAN: 44 mL/min — AB (ref >90)
Glucose, Bld: 398 mg/dL — ABNORMAL HIGH (ref 70–99)
Glucose, Bld: 327 mg/dL — ABNORMAL HIGH (ref 70–99)
Glucose, Bld: 193 mg/dL — ABNORMAL HIGH (ref 70–99)
Glucose, Bld: 260 mg/dL — ABNORMAL HIGH (ref 70–99)
Potassium: 5.1 mEq/L (ref 3.7–5.3)
Potassium: 4.9 mEq/L (ref 3.7–5.3)
Potassium: 4.8 mEq/L (ref 3.7–5.3)
Potassium: 4.8 mEq/L (ref 3.7–5.3)
Sodium: 131 mEq/L — ABNORMAL LOW (ref 137–147)
Sodium: 132 mEq/L — ABNORMAL LOW (ref 137–147)
Sodium: 134 mEq/L — ABNORMAL LOW (ref 137–147)
Sodium: 130 mEq/L — ABNORMAL LOW (ref 137–147)

## 2014-10-12 LAB — MRSA PCR SCREENING: MRSA by PCR: NEGATIVE

## 2014-10-12 LAB — URINALYSIS, ROUTINE W REFLEX MICROSCOPIC
Hgb urine dipstick: NEGATIVE
KETONES UR: NEGATIVE mg/dL
LEUKOCYTES UA: NEGATIVE
Nitrite: NEGATIVE
PH: 5 (ref 5.0–8.0)
Protein, ur: NEGATIVE mg/dL
SPECIFIC GRAVITY, URINE: 1.03 (ref 1.005–1.030)
Urobilinogen, UA: 0.2 mg/dL (ref 0.0–1.0)

## 2014-10-12 LAB — CBG MONITORING, ED
GLUCOSE-CAPILLARY: 531 mg/dL — AB (ref 70–99)
GLUCOSE-CAPILLARY: 493 mg/dL — AB (ref 70–99)
GLUCOSE-CAPILLARY: 276 mg/dL — AB (ref 70–99)
GLUCOSE-CAPILLARY: 171 mg/dL — AB (ref 70–99)
Glucose-Capillary: 365 mg/dL — ABNORMAL HIGH (ref 70–99)
Glucose-Capillary: 352 mg/dL — ABNORMAL HIGH (ref 70–99)
Glucose-Capillary: 304 mg/dL — ABNORMAL HIGH (ref 70–99)
Glucose-Capillary: 198 mg/dL — ABNORMAL HIGH (ref 70–99)
Glucose-Capillary: 238 mg/dL — ABNORMAL HIGH (ref 70–99)
Glucose-Capillary: 191 mg/dL — ABNORMAL HIGH (ref 70–99)
Glucose-Capillary: 178 mg/dL — ABNORMAL HIGH (ref 70–99)
Glucose-Capillary: 243 mg/dL — ABNORMAL HIGH (ref 70–99)

## 2014-10-12 LAB — HEMOGLOBIN A1C
Hgb A1c MFr Bld: 10.7 % — ABNORMAL HIGH (ref <5.7)
Mean Plasma Glucose: 260 mg/dL — ABNORMAL HIGH (ref <117)

## 2014-10-12 LAB — CBC
HCT: 39.6 % (ref 39.0–52.0)
Hemoglobin: 13.6 g/dL (ref 13.0–17.0)
MCH: 30 pg (ref 26.0–34.0)
MCHC: 34.3 g/dL (ref 30.0–36.0)
MCV: 87.2 fL (ref 78.0–100.0)
Platelets: 208 10*3/uL (ref 150–400)
RBC: 4.54 MIL/uL (ref 4.22–5.81)
RDW: 12.2 % (ref 11.5–15.5)
WBC: 9.6 10*3/uL (ref 4.0–10.5)

## 2014-10-12 LAB — URINE MICROSCOPIC-ADD ON

## 2014-10-12 MED ORDER — NICOTINE 14 MG/24HR TD PT24
14.0000 mg | MEDICATED_PATCH | Freq: Every day | TRANSDERMAL | Status: DC
  Administered 2014-10-12 – 2014-10-15 (×4): 14 mg via TRANSDERMAL
  Filled 2014-10-12 (×4): qty 1

## 2014-10-12 MED ORDER — AMLODIPINE BESYLATE 2.5 MG PO TABS
2.5000 mg | ORAL_TABLET | Freq: Every day | ORAL | Status: DC
  Administered 2014-10-12 – 2014-10-15 (×4): 2.5 mg via ORAL
  Filled 2014-10-12 (×4): qty 1

## 2014-10-12 MED ORDER — ALBUTEROL SULFATE HFA 108 (90 BASE) MCG/ACT IN AERS: 2.0000 | INHALATION_SPRAY | RESPIRATORY_TRACT | Status: DC | PRN

## 2014-10-12 MED ORDER — METOPROLOL SUCCINATE ER 50 MG PO TB24
50.0000 mg | ORAL_TABLET | Freq: Every day | ORAL | Status: DC
  Administered 2014-10-12 – 2014-10-14 (×3): 50 mg via ORAL
  Filled 2014-10-12: qty 1
  Filled 2014-10-12: qty 2
  Filled 2014-10-12 (×2): qty 1

## 2014-10-12 MED ORDER — SODIUM CHLORIDE 0.9 % IV BOLUS (SEPSIS)
1000.0000 mL | Freq: Once | INTRAVENOUS | Status: AC
  Administered 2014-10-12: 1000 mL via INTRAVENOUS

## 2014-10-12 MED ORDER — DEXTROSE-NACL 5-0.45 % IV SOLN
INTRAVENOUS | Status: DC
  Administered 2014-10-12: 07:00:00 via INTRAVENOUS

## 2014-10-12 MED ORDER — NITROGLYCERIN 0.4 MG/HR TD PT24
0.4000 mg | MEDICATED_PATCH | Freq: Every day | TRANSDERMAL | Status: DC
  Administered 2014-10-12 – 2014-10-15 (×4): 0.4 mg via TRANSDERMAL
  Filled 2014-10-12 (×5): qty 1

## 2014-10-12 MED ORDER — INSULIN ASPART 100 UNIT/ML ~~LOC~~ SOLN
0.0000 [IU] | Freq: Every day | SUBCUTANEOUS | Status: DC
  Administered 2014-10-12: 4 [IU] via SUBCUTANEOUS
  Administered 2014-10-13: 3 [IU] via SUBCUTANEOUS

## 2014-10-12 MED ORDER — IPRATROPIUM-ALBUTEROL 0.5-2.5 (3) MG/3ML IN SOLN
3.0000 mL | Freq: Four times a day (QID) | RESPIRATORY_TRACT | Status: DC
  Administered 2014-10-12 – 2014-10-13 (×5): 3 mL via RESPIRATORY_TRACT
  Filled 2014-10-12 (×7): qty 3

## 2014-10-12 MED ORDER — LISINOPRIL 10 MG PO TABS
10.0000 mg | ORAL_TABLET | Freq: Every day | ORAL | Status: DC
  Administered 2014-10-12 – 2014-10-13 (×2): 10 mg via ORAL
  Filled 2014-10-12 (×2): qty 1

## 2014-10-12 MED ORDER — SODIUM CHLORIDE 0.9 % IV SOLN
INTRAVENOUS | Status: DC
Start: 2014-10-12 — End: 2014-10-13
  Administered 2014-10-12 (×2): via INTRAVENOUS

## 2014-10-12 MED ORDER — LINAGLIPTIN 5 MG PO TABS
5.0000 mg | ORAL_TABLET | Freq: Every day | ORAL | Status: DC
  Administered 2014-10-12 – 2014-10-15 (×4): 5 mg via ORAL
  Filled 2014-10-12 (×4): qty 1

## 2014-10-12 MED ORDER — ROSUVASTATIN CALCIUM 20 MG PO TABS
20.0000 mg | ORAL_TABLET | Freq: Every day | ORAL | Status: DC
  Administered 2014-10-12 – 2014-10-15 (×4): 20 mg via ORAL
  Filled 2014-10-12 (×4): qty 1

## 2014-10-12 MED ORDER — LEVETIRACETAM ER 500 MG PO TB24
1000.0000 mg | ORAL_TABLET | Freq: Every day | ORAL | Status: DC
  Administered 2014-10-12 – 2014-10-15 (×4): 1000 mg via ORAL
  Filled 2014-10-12 (×4): qty 2

## 2014-10-12 MED ORDER — ETODOLAC 400 MG PO TABS
400.0000 mg | ORAL_TABLET | Freq: Two times a day (BID) | ORAL | Status: DC
  Administered 2014-10-12 – 2014-10-15 (×7): 400 mg via ORAL
  Filled 2014-10-12 (×9): qty 1

## 2014-10-12 MED ORDER — HEPARIN SODIUM (PORCINE) 5000 UNIT/ML IJ SOLN
5000.0000 [IU] | Freq: Three times a day (TID) | INTRAMUSCULAR | Status: DC
Start: 2014-10-12 — End: 2014-10-15
  Administered 2014-10-12 – 2014-10-15 (×10): 5000 [IU] via SUBCUTANEOUS
  Filled 2014-10-12 (×12): qty 1

## 2014-10-12 MED ORDER — DEXTROSE 50 % IV SOLN: 25.0000 mL | INTRAVENOUS | Status: DC | PRN

## 2014-10-12 MED ORDER — ALBUTEROL SULFATE (2.5 MG/3ML) 0.083% IN NEBU: 2.5000 mg | INHALATION_SOLUTION | RESPIRATORY_TRACT | Status: DC | PRN

## 2014-10-12 MED ORDER — SODIUM CHLORIDE 0.9 % IV SOLN
INTRAVENOUS | Status: DC
  Administered 2014-10-12: 6.1 [IU]/h via INTRAVENOUS

## 2014-10-12 MED ORDER — INSULIN ASPART 100 UNIT/ML ~~LOC~~ SOLN
0.0000 [IU] | Freq: Three times a day (TID) | SUBCUTANEOUS | Status: DC
  Administered 2014-10-12 – 2014-10-13 (×2): 9 [IU] via SUBCUTANEOUS
  Administered 2014-10-13 (×2): 5 [IU] via SUBCUTANEOUS
  Administered 2014-10-14: 3 [IU] via SUBCUTANEOUS
  Administered 2014-10-14: 2 [IU] via SUBCUTANEOUS
  Administered 2014-10-14: 5 [IU] via SUBCUTANEOUS
  Administered 2014-10-15: 2 [IU] via SUBCUTANEOUS

## 2014-10-12 MED ORDER — SODIUM CHLORIDE 0.9 % IV SOLN
INTRAVENOUS | Status: DC
  Administered 2014-10-12: 02:00:00 via INTRAVENOUS
  Filled 2014-10-12: qty 2.5

## 2014-10-12 MED ORDER — HYDROCODONE-ACETAMINOPHEN 10-325 MG PO TABS: 1.0000 | ORAL_TABLET | Freq: Four times a day (QID) | ORAL | Status: DC | PRN

## 2014-10-12 MED ORDER — INSULIN ASPART PROT & ASPART (70-30 MIX) 100 UNIT/ML ~~LOC~~ SUSP
10.0000 [IU] | Freq: Two times a day (BID) | SUBCUTANEOUS | Status: DC
  Administered 2014-10-12: 10 [IU] via SUBCUTANEOUS
  Filled 2014-10-12: qty 10

## 2014-10-12 MED ORDER — SODIUM CHLORIDE 0.9 % IV SOLN
INTRAVENOUS | Status: DC
  Administered 2014-10-12: 02:00:00 via INTRAVENOUS

## 2014-10-12 NOTE — Progress Notes (Signed)
UR completed 

## 2014-10-12 NOTE — ED Notes (Signed)
Pharmacy called and requested daily medications

## 2014-10-12 NOTE — ED Notes (Signed)
Meal tray ordered to be delivered to Pod A to transition pt from insulin drop to SQ insulin and saline.

## 2014-10-12 NOTE — ED Provider Notes (Signed)
CSN: [id number]     Arrival date & time 10/11/14  2133 History   First MD Initiated Contact with Patient 10/12/14 0010     Chief Complaint  Patient presents with  . Tremors  . Spasms     (Consider location/radiation/quality/duration/timing/severity/associated sxs/prior Treatment) HPI Patient has a history of diabetes, hypertension, blindness and chronic back pain who presents with spasms to his left upper and lower extremity for 2 days. Describes the spasms is episodic. No new neck pain. No fever or chills. No focal weakness or numbness.  Denies nausea or vomiting. Patient intermittently receives steroid injections for his chronic pain. Past Medical History  Diagnosis Date  . DM (diabetes mellitus) type II controlled peripheral vascular disorder   . Essential hypertension, benign   . Blind   . Headache(784.0)   . Sebaceous cyst   . Chronic airway obstruction, not elsewhere classified   . Cough   . Spinal stenosis, lumbar region, with neurogenic claudication   . Coronary atherosclerosis of native coronary artery   . Vitamin D deficiency   . Rash and other nonspecific skin eruption   . Other and unspecified hyperlipidemia   . Anxiety state, unspecified   . Depressive disorder, not elsewhere classified   . Legal blindness, as defined in [place]   . Reflux esophagitis   . Unspecified constipation   . Pain in joint, site unspecified   . Memory loss   . Palpitations   . Type II diabetes mellitus with neurological manifestations, uncontrolled   . H/O hiatal hernia    Past Surgical History  Procedure Laterality Date  . Coronary angioplasty with stent placement    . Hernia repair    . Eye surgery     Family History  Problem Relation Age of Onset  . Asthma Mother   . Diabetes Son   . Cancer Daughter   . Diabetes Son    History  Substance Use Topics  . Smoking status: Former Smoker -- 1.00 packs/day for 50 years    Types: Cigarettes    Quit date: 03/07/2013  . Smokeless  tobacco: Never Used  . Alcohol Use: No    Review of Systems  Constitutional: Negative for fever and chills.  Respiratory: Negative for shortness of breath.   Cardiovascular: Negative for chest pain.  Gastrointestinal: Negative for nausea, vomiting, abdominal pain and diarrhea.  Genitourinary: Negative for dysuria.  Musculoskeletal: Positive for back pain and neck pain. Negative for neck stiffness.  Skin: Negative for rash and wound.  Neurological: Positive for tremors. Negative for dizziness, weakness, light-headedness, numbness and headaches.  All other systems reviewed and are negative.     Allergies  Flexeril  Home Medications   Prior to Admission medications   Medication Sig Start Date End Date Taking? Authorizing Provider  acetaminophen (TYLENOL) 500 MG tablet Take 500 mg by mouth every 8 (eight) hours as needed for pain.    Yes Historical Provider, MD  albuterol (PROVENTIL HFA;VENTOLIN HFA) 108 (90 BASE) MCG/ACT inhaler Inhale 2 puffs into the lungs every 2 (two) hours as needed for wheezing or shortness of breath. 07/26/14  Yes Tiffany L Reed, DO  amLODipine (NORVASC) 2.5 MG tablet Take 2.5 mg by mouth daily. 08/13/14  Yes Historical Provider, MD  Cholecalciferol (VITAMIN D) 2000 UNITS CAPS Take 1 capsule by mouth daily.   Yes Historical Provider, MD  CRESTOR 20 MG tablet Take 20 mg by mouth daily. 08/13/14  Yes Historical Provider, MD  esomeprazole (NEXIUM) 40 MG packet Take 40  mg by mouth daily before breakfast.   Yes Historical Provider, MD  etodolac (LODINE) 400 MG tablet Take 400 mg by mouth 2 (two) times daily.   Yes Historical Provider, MD  GARLIC PO Take 75 mg by mouth daily.    Yes Historical Provider, MD  HYDROcodone-acetaminophen (NORCO) 10-325 MG per tablet Take 1 tablet by mouth every 6 (six) hours as needed (pain). 09/28/14  Yes Tiffany L Reed, DO  levETIRAcetam (KEPPRA XR) 500 MG 24 hr tablet Take 2 tablets (1,000 mg total) by mouth daily. 08/20/14  Yes Maryann  Mikhail, DO  linagliptin (TRADJENTA) 5 MG TABS tablet Take 1 tablet (5 mg total) by mouth daily. 07/26/14  Yes Tiffany L Reed, DO  lisinopril (PRINIVIL,ZESTRIL) 10 MG tablet Take 1 tablet (10 mg total) by mouth daily. FOR BLOOD PRESSURE 07/24/14  Yes Mahima Glade Lloyd, MD  metFORMIN (GLUCOPHAGE) 500 MG tablet Take 2 tablets (1,000 mg total) by mouth daily with breakfast. 08/22/14  Yes Maryann Mikhail, DO  metoprolol succinate (TOPROL-XL) 50 MG 24 hr tablet Take 50 mg by mouth daily.    Yes Historical Provider, MD  Multiple Vitamin (MULITIVITAMIN WITH MINERALS) TABS Take 1 tablet by mouth daily.   Yes Historical Provider, MD  nicotine (NICODERM CQ - DOSED IN MG/24 HOURS) 14 mg/24hr patch Place 1 patch (14 mg total) onto the skin daily. 08/20/14  Yes Maryann Mikhail, DO  nitroGLYCERIN (NITRODUR - DOSED IN MG/24 HR) 0.4 mg/hr Place 1 patch onto the skin daily.   Yes Historical Provider, MD  Omega 3 1200 MG CAPS Take 2,400 mg by mouth daily.    Yes Historical Provider, MD   BP 166/107 mmHg  Pulse 86  Temp(Src) 97.9 F (36.6 C) (Oral)  Resp 13  SpO2 98% Physical Exam  Constitutional: He is oriented to person, place, and time. He appears well-developed and well-nourished. No distress.  HENT:  Head: Normocephalic and atraumatic.  Mouth/Throat: Oropharynx is clear and moist.  Neck: Normal range of motion. Neck supple.  No posterior midline cervical tenderness with palpation. No meningismus.  Cardiovascular: Normal rate and regular rhythm.  Exam reveals no gallop and no friction rub.   Murmur heard. Pulmonary/Chest: Effort normal and breath sounds normal. No respiratory distress. He has no wheezes. He has no rales. He exhibits no tenderness.  Abdominal: Soft. Bowel sounds are normal. He exhibits no distension and no mass. There is no tenderness. There is no rebound and no guarding.  Musculoskeletal: Normal range of motion. He exhibits no edema or tenderness.  Neurological: He is alert and oriented to  person, place, and time.  5/5 motor in all extremities. sensation is intact.patient with witnessed spasm of the left upper extremity. Lasted 5-10 seconds. No change in level of consciousness.  Skin: Skin is warm and dry. No rash noted. No erythema.  Psychiatric: He has a normal mood and affect. His behavior is normal.  Nursing note and vitals reviewed.   ED Course  Procedures (including critical care time) Labs Review Labs Reviewed  COMPREHENSIVE METABOLIC PANEL - Abnormal; Notable for the following:    Sodium 121 (*)    Potassium 5.6 (*)    Chloride 84 (*)    Glucose, Bld 769 (*)    BUN 51 (*)    Creatinine, Ser 2.13 (*)    AST 39 (*)    GFR calc non Af Amer 27 (*)    GFR calc Af Amer 32 (*)    Anion gap 17 (*)    All  other components within normal limits  CBC WITH DIFFERENTIAL  URINALYSIS, ROUTINE W REFLEX MICROSCOPIC    Imaging Review No results found.   EKG Interpretation None      MDM   Final diagnoses:  None    Patient with history of diabetes, hypertension and blindness presents with uncontrolled spasms of the left upper and lower extremities for the past 2 days. Witnessed in the ED. Doubt partial seizure activity. Patient with significantly elevated blood glucose. Elevated creatinine and hyponatremia which is likely pseudohyponatremia. Will start on insulin drip and IV fluids. Will need admission.    Loren Racer, MD 10/12/14 [phone]

## 2014-10-12 NOTE — ED Notes (Signed)
Ordered pt a diet carb modified meal

## 2014-10-12 NOTE — ED Notes (Signed)
Report attempted x 1

## 2014-10-12 NOTE — ED Notes (Signed)
Insulin Drip running at 5.5units/hr. Last cbg noted at 6:54

## 2014-10-12 NOTE — ED Notes (Addendum)
This RN unsure that first CBG at 13:06 was accurate on reader.  Redone on a different finger and captured 178.

## 2014-10-12 NOTE — ED Notes (Signed)
Hospitalist at the bedside 

## 2014-10-12 NOTE — H&P (Signed)
[facility] History and Physical  Patient: Eddie Graves  TAV:[id number]  DOB: 03/24/1931  DOS: the patient was seen and examined on 10/12/2014 PCP: Bufford Spikes, DO  Chief Complaint: involuntary movement of the left arm  HPI: Eddie Graves is a 80 y.o. male with Past medical history of diabetes mellitus, hypertension, blind bilaterally, coronary artery disease, GERD, memory issue. The patient presents with complaints of left arm involuntary movement that has started since last one week. He mentions he was admitted in the hospital in September with similar symptoms and was started on Keppra. Last night when his son was riding his medication they found that his Route did not have any refill and since that Route was not in his pillbox. He denies any fever or chills complaints of a cough without any expectoration denies any burning urination or diarrhea. Denies any swelling of his legs. His medications are provided by his son and he denies any recent change in that arrangement. He has been undergoing significant stress at home due to family issues.  The patient is coming from home. And at his baseline independent for most of his ADL.  Review of Systems: as mentioned in the history of present illness.  A Comprehensive review of the other systems is negative.  Past Medical History  Diagnosis Date  . DM (diabetes mellitus) type II controlled peripheral vascular disorder   . Essential hypertension, benign   . Blind   . Headache(784.0)   . Sebaceous cyst   . Chronic airway obstruction, not elsewhere classified   . Cough   . Spinal stenosis, lumbar region, with neurogenic claudication   . Coronary atherosclerosis of native coronary artery   . Vitamin D deficiency   . Rash and other nonspecific skin eruption   . Other and unspecified hyperlipidemia   . Anxiety state, unspecified   . Depressive disorder, not elsewhere classified   . Legal blindness, as defined in [place]   . Reflux  esophagitis   . Unspecified constipation   . Pain in joint, site unspecified   . Memory loss   . Palpitations   . Type II diabetes mellitus with neurological manifestations, uncontrolled   . H/O hiatal hernia    Past Surgical History  Procedure Laterality Date  . Coronary angioplasty with stent placement    . Hernia repair    . Eye surgery     Social History:  reports that he quit smoking about 19 months ago. His smoking use included Cigarettes. He has a 50 pack-year smoking history. He has never used smokeless tobacco. He reports that he does not drink alcohol or use illicit drugs.  Allergies  Allergen Reactions  . Flexeril [Cyclobenzaprine] Other (See Comments)    delirium    Family History  Problem Relation Age of Onset  . Asthma Mother   . Diabetes Son   . Cancer Daughter   . Diabetes Son     Prior to Admission medications   Medication Sig Start Date End Date Taking? Authorizing Provider  acetaminophen (TYLENOL) 500 MG tablet Take 500 mg by mouth every 8 (eight) hours as needed for pain.    Yes Historical Provider, MD  albuterol (PROVENTIL HFA;VENTOLIN HFA) 108 (90 BASE) MCG/ACT inhaler Inhale 2 puffs into the lungs every 2 (two) hours as needed for wheezing or shortness of breath. 07/26/14  Yes Tiffany L Reed, DO  amLODipine (NORVASC) 2.5 MG tablet Take 2.5 mg by mouth daily. 08/13/14  Yes Historical Provider, MD  Cholecalciferol (VITAMIN D) 2000  UNITS CAPS Take 1 capsule by mouth daily.   Yes Historical Provider, MD  CRESTOR 20 MG tablet Take 20 mg by mouth daily. 08/13/14  Yes Historical Provider, MD  esomeprazole (NEXIUM) 40 MG packet Take 40 mg by mouth daily before breakfast.   Yes Historical Provider, MD  etodolac (LODINE) 400 MG tablet Take 400 mg by mouth 2 (two) times daily.   Yes Historical Provider, MD  GARLIC PO Take 75 mg by mouth daily.    Yes Historical Provider, MD  HYDROcodone-acetaminophen (NORCO) 10-325 MG per tablet Take 1 tablet by mouth every 6 (six)  hours as needed (pain). 09/28/14  Yes Tiffany L Reed, DO  levETIRAcetam (KEPPRA XR) 500 MG 24 hr tablet Take 2 tablets (1,000 mg total) by mouth daily. 08/20/14  Yes Maryann Mikhail, DO  linagliptin (TRADJENTA) 5 MG TABS tablet Take 1 tablet (5 mg total) by mouth daily. 07/26/14  Yes Tiffany L Reed, DO  lisinopril (PRINIVIL,ZESTRIL) 10 MG tablet Take 1 tablet (10 mg total) by mouth daily. FOR BLOOD PRESSURE 07/24/14  Yes Mahima Glade Lloyd, MD  metFORMIN (GLUCOPHAGE) 500 MG tablet Take 2 tablets (1,000 mg total) by mouth daily with breakfast. 08/22/14  Yes Maryann Mikhail, DO  metoprolol succinate (TOPROL-XL) 50 MG 24 hr tablet Take 50 mg by mouth daily.    Yes Historical Provider, MD  Multiple Vitamin (MULITIVITAMIN WITH MINERALS) TABS Take 1 tablet by mouth daily.   Yes Historical Provider, MD  nicotine (NICODERM CQ - DOSED IN MG/24 HOURS) 14 mg/24hr patch Place 1 patch (14 mg total) onto the skin daily. 08/20/14  Yes Maryann Mikhail, DO  nitroGLYCERIN (NITRODUR - DOSED IN MG/24 HR) 0.4 mg/hr Place 1 patch onto the skin daily.   Yes Historical Provider, MD  Omega 3 1200 MG CAPS Take 2,400 mg by mouth daily.    Yes Historical Provider, MD    Physical Exam: Filed Vitals:   10/12/14 0530 10/12/14 0545 10/12/14 0600 10/12/14 0615  BP: 99/59 102/68 94/61 115/67  Pulse: 80 82 80 80  Temp:      TempSrc:      Resp: 16 16 15 17   SpO2: 97% 96% 98% 96%    General: Alert, Awake and Oriented to Time, Place and Person. Appear in mild distress Eyes: PERRL ENT: Oral Mucosa clear moist. Neck: no JVD Cardiovascular: S1 and S2 Present, no Murmur, Peripheral Pulses Present Respiratory: Bilateral Air entry equal and Decreased, Clear to Auscultation, noCrackles, no wheezes Abdomen: Bowel Sound present, Soft and non tender Skin: no Rash Extremities: on Pedal edema, no calf tenderness Neurologic: Grossly no focal neuro deficit.  Labs on Admission:  CBC:  Recent Labs Lab 10/11/14 2148 10/12/14 0358  WBC 8.3  9.6  NEUTROABS 5.8  --   HGB 13.3 13.6  HCT 39.5 39.6  MCV 90.2 87.2  PLT 191 208    CMP     Component Value Date/Time   NA 132* 10/12/2014 0538   NA 138 07/26/2014 0947   K 4.9 10/12/2014 0538   CL 96 10/12/2014 0538   CO2 22 10/12/2014 0538   GLUCOSE 327* 10/12/2014 0538   GLUCOSE 180* 07/26/2014 0947   BUN 48* 10/12/2014 0538   BUN 14 07/26/2014 0947   CREATININE 1.72* 10/12/2014 0538   CALCIUM 9.8 10/12/2014 0538   PROT 7.3 10/11/2014 2148   PROT 7.4 07/26/2014 0947   ALBUMIN 3.6 10/11/2014 2148   AST 39* 10/11/2014 2148   ALT 47 10/11/2014 2148   ALKPHOS 93 10/11/2014  2148   BILITOT 0.5 10/11/2014 2148   GFRNONAA 36* 10/12/2014 0538   GFRAA 41* 10/12/2014 0538    No results for input(s): LIPASE, AMYLASE in the last 168 hours. No results for input(s): AMMONIA in the last 168 hours.  No results for input(s): CKTOTAL, CKMB, CKMBINDEX, TROPONINI in the last 168 hours. BNP (last 3 results) No results for input(s): PROBNP in the last 8760 hours.  Radiological Exams on Admission: No results found.  Assessment/Plan Principal Problem:   DKA (diabetic ketoacidoses) Active Problems:   COPD bronchitis   Essential hypertension, benign   Tobacco abuse   Diabetic peripheral neuropathy associated with type 2 diabetes mellitus   Blind   Hyperglycemia   Involuntary muscle contractions   1. DKA (diabetic ketoacidoses) The patient is presenting with complaints ofinterval entry left hand movement on further workup is also found to have DKA with hyperglycemia and acidosis. With this the patient also found to have acute kidney injury and therefore he will be admitted in the step down unit. I would give him IV hydration. Continue with home insulin drip depending on his sugar.  2.left arm involuntary movement. Patient has been seen by neurology during his past admissions and has not been able to get his Keppra. Currently I would continue him on Keppra and monitor him  closely for any withdrawal. Seizure prophylaxis and IV antibiotic as needed.  3.COPD with bilateral wheezing. nebulization and oxygen as needed 4.probable abuse. Patient denies using any tobacco at present. Continue close monitoring.  5. Acute kidney injury. Likely secondary to poor oral intake. Check bladder scan and kidney ultrasound. IV hydration.  Advance goals of care discussion: full code   DVT Prophylaxis: subcutaneous Heparin, nothing by mouth  Disposition: Admitted to inpatient in stepdown unit.  Author: Lynden Oxford, MD [facility] Pager: [phone] 10/12/2014,   If 7PM-7AM, please contact night-coverage [email or web address] Password [email or web address]

## 2014-10-12 NOTE — ED Notes (Signed)
Pt returned from CT °

## 2014-10-12 NOTE — Progress Notes (Signed)
Asian admitted earlier today for DKA and breakthrough seizure. Due to medication noncompliance and dietary noncompliance.  A.Gap Closed, transitioned to 7030 twice a day insulin, on Keppra which she was out of for the last few days. Monitor.

## 2014-10-13 LAB — GLUCOSE, CAPILLARY
GLUCOSE-CAPILLARY: 364 mg/dL — AB (ref 70–99)
Glucose-Capillary: 351 mg/dL — ABNORMAL HIGH (ref 70–99)
Glucose-Capillary: 347 mg/dL — ABNORMAL HIGH (ref 70–99)
Glucose-Capillary: 288 mg/dL — ABNORMAL HIGH (ref 70–99)
Glucose-Capillary: 290 mg/dL — ABNORMAL HIGH (ref 70–99)
Glucose-Capillary: 278 mg/dL — ABNORMAL HIGH (ref 70–99)

## 2014-10-13 LAB — BASIC METABOLIC PANEL
Anion gap: 13 (ref 5–15)
BUN: 40 mg/dL — ABNORMAL HIGH (ref 6–23)
CO2: 23 mEq/L (ref 19–32)
Calcium: 9.3 mg/dL (ref 8.4–10.5)
Chloride: 94 mEq/L — ABNORMAL LOW (ref 96–112)
Creatinine, Ser: 1.47 mg/dL — ABNORMAL HIGH (ref 0.50–1.35)
GFR calc Af Amer: 50 mL/min — ABNORMAL LOW (ref >90)
GFR calc non Af Amer: 43 mL/min — ABNORMAL LOW (ref >90)
Glucose, Bld: 338 mg/dL — ABNORMAL HIGH (ref 70–99)
Potassium: 5 mEq/L (ref 3.7–5.3)
Sodium: 130 mEq/L — ABNORMAL LOW (ref 137–147)

## 2014-10-13 LAB — TROPONIN I: Troponin I: <0.3 ng/mL (ref <0.30)

## 2014-10-13 MED ORDER — SODIUM CHLORIDE 0.9 % IV SOLN
INTRAVENOUS | Status: AC
  Administered 2014-10-13: 13:00:00 via INTRAVENOUS

## 2014-10-13 MED ORDER — IPRATROPIUM-ALBUTEROL 0.5-2.5 (3) MG/3ML IN SOLN: 3.0000 mL | RESPIRATORY_TRACT | Status: DC | PRN

## 2014-10-13 MED ORDER — INSULIN ASPART PROT & ASPART (70-30 MIX) 100 UNIT/ML ~~LOC~~ SUSP
20.0000 [IU] | Freq: Two times a day (BID) | SUBCUTANEOUS | Status: DC
  Administered 2014-10-13 – 2014-10-14 (×3): 20 [IU] via SUBCUTANEOUS
  Filled 2014-10-13: qty 10

## 2014-10-13 NOTE — Plan of Care (Signed)
Problem: Phase I Progression Outcomes Goal: Pain controlled with appropriate interventions Outcome: Completed/Met Date Met:  10/13/14 Goal: OOB as tolerated unless otherwise ordered Outcome: Completed/Met Date Met:  10/13/14 Goal: Initial discharge plan identified Outcome: Completed/Met Date Met:  10/13/14 Goal: Voiding-avoid urinary catheter unless indicated Outcome: Completed/Met Date Met:  10/13/14 Goal: Hemodynamically stable Outcome: Completed/Met Date Met:  10/13/14

## 2014-10-13 NOTE — Progress Notes (Signed)
Patient Demographics  Eddie Graves, is a 80 y.o. male, DOB - 15-Jan-1931, JYN:[id number]  Admit date - 10/11/2014   Admitting Physician Lynden Oxford, MD  Outpatient Primary MD for the patient is REED, TIFFANY, DO  LOS - 2   Chief Complaint  Patient presents with  . Tremors  . Spasms        Subjective:   Eddie Graves today has, No headache, No chest pain, No abdominal pain - No Nausea, No new weakness tingling or numbness, No Cough - SOB.    Assessment & Plan   1. Mild DKA in a type II diabetic. Gap closed DKA resolved, he is noncompliant with his diabetic regimen and diet control. Consult diabetic educator. Placed him on 7030 insulin twice a day along with sliding scale.  Lab Results  Component Value Date   HGBA1C 10.7* 10/12/2014    CBG (last 3)   Recent Labs  10/12/14 1715 10/12/14 2229 10/13/14 0837  GLUCAP 351* 347* 288*     2. Left arm twitching. Recent diagnosis of partial seizure. Was noncompliant with Keppra, Keppra resume twitching resolved. Case management consulted to assist with outpatient medications.   3. Essential hypertension. On Norvasc and beta blocker. Stable continue, hold ACE/ARB due to ARF.   4.  Hyponatremia with ARF. Due to #1. Has been hydrated, renal function and renal ultrasound stable, creatinine improving. Hold ACE.   5. COPD. Stable no acute issues. No wheezing.     Code Status: full  Family Communication: none present  Disposition Plan: home   Procedures Renal US   Consults  DM educator   Medications  Scheduled Meds: . amLODipine  2.5 mg Oral Daily  . etodolac  400 mg Oral BID  . heparin  5,000 Units Subcutaneous 3 times per day  . insulin aspart  0-5 Units Subcutaneous QHS  . insulin aspart  0-9 Units Subcutaneous TID WC    . insulin aspart protamine- aspart  20 Units Subcutaneous BID WC  . ipratropium-albuterol  3 mL Inhalation Q6H  . levETIRAcetam  1,000 mg Oral Daily  . linagliptin  5 mg Oral Daily  . lisinopril  10 mg Oral Daily  . metoprolol succinate  50 mg Oral Daily  . nicotine  14 mg Transdermal Daily  . nitroGLYCERIN  0.4 mg Transdermal Daily  . rosuvastatin  20 mg Oral Daily   Continuous Infusions:  PRN  Meds:.albuterol, dextrose, HYDROcodone-acetaminophen  DVT Prophylaxis    Heparin    Lab Results  Component Value Date   PLT 208 10/12/2014    Antibiotics     Anti-infectives    None          Objective:   Filed Vitals:   10/12/14 2300 10/13/14 0204 10/13/14 0426 10/13/14 0830  BP: 142/72  111/74   Pulse: 87 79 81   Temp: 98.6 F (37 C)  98 F (36.7 C)   TempSrc: Oral  Oral   Resp: 28 21 13    Height:      Weight:      SpO2: 98% 99% 98% 99%    Wt Readings from Last 3 Encounters:  10/12/14 92.4 kg (203 lb 11.3 oz)  09/28/14 95.255 kg (210 lb)  08/27/14 94.802 kg (209 lb)     Intake/Output Summary (Last 24 hours) at 10/13/14 1016 Last data filed at 10/13/14 0700  Gross per 24 hour  Intake   1200 ml  Output   2725 ml  Net  -1525 ml     Physical Exam  Awake Alert, Oriented X 3, No new F.N deficits, Normal affect,legally blind [place].AT,PERRAL Supple Neck,No JVD, No cervical lymphadenopathy appriciated.  Symmetrical Chest wall movement, Good air movement bilaterally, CTAB RRR,No Gallops,Rubs or new Murmurs, No Parasternal Heave +ve B.Sounds, Abd Soft, No tenderness, No organomegaly appriciated, No rebound - guarding or rigidity. No Cyanosis, Clubbing or edema, No new Rash or bruise     Data Review   Micro Results Recent Results (from the past 240 hour(s))  MRSA PCR Screening     Status: None   Collection Time: 10/12/14  3:00 PM  Result Value Ref Range Status   MRSA by PCR NEGATIVE NEGATIVE Final    Comment:        The GeneXpert MRSA Assay (FDA approved  for NASAL specimens only), is one component of a comprehensive MRSA colonization surveillance program. It is not intended to diagnose MRSA infection nor to guide or monitor treatment for MRSA infections.     Radiology Reports    13/06/15 Renal  10/12/2014   CLINICAL DATA:  Acute renal insufficiency  EXAM: RENAL/URINARY TRACT ULTRASOUND COMPLETE  COMPARISON:  None.  FINDINGS: Right Kidney:  Length: 12.9 cm. Echogenicity and renal cortical thickness are within normal limits. No perinephric fluid or pelvicaliectasis visualized. There is a simple cyst arising from the upper pole the right kidney measuring 4.3 x 3.5 x 3.9 cm. No other renal mass seen. There is a column of Bertin on the right, an anatomic variant. No sonographically demonstrable calculus or ureterectasis.  Left Kidney:  Length: 11.7 cm. Echogenicity and renal cortical thickness are within normal limits. No perinephric fluid or pelvicaliectasis visualized. There is a simple cyst arising from the lower pole left kidney measuring 1.2 x 1.3 x 1.3 cm. No other renal mass seen. No sonographically demonstrable calculus or ureterectasis.  Bladder:  Appears normal for degree of bladder distention.  IMPRESSION: Renal cyst on each side.  Study otherwise unremarkable.   Electronically Signed   By: 13/05/2014 M.D.   On: 10/12/2014 09:41     CBC  Recent Labs Lab 10/11/14 2148 10/12/14 0358  WBC 8.3 9.6  HGB 13.3 13.6  HCT 39.5 39.6  PLT 191 208  MCV 90.2 87.2  MCH 30.4 30.0  MCHC 33.7 34.3  RDW 12.5 12.2  LYMPHSABS 1.7  --   MONOABS 0.7  --   EOSABS 0.1  --  BASOSABS 0.0  --     Chemistries   Recent Labs Lab 10/11/14 2148 10/12/14 0358 10/12/14 0538 10/12/14 0758 10/12/14 0951 10/13/14 0250  NA 121* 131* 132* 134* 130* 130*  K 5.6* 5.1 4.9 4.8 4.8 5.0  CL 84* 95* 96 98 95* 94*  CO2 20 20 22 21 21 23   GLUCOSE 769* 398* 327* 193* 260* 338*  BUN 51* 47* 48* 44* 44* 40*  CREATININE 2.13* 1.75* 1.72* 1.62* 1.44* 1.47*    CALCIUM 9.7 9.8 9.8 9.7 9.7 9.3  AST 39*  --   --   --   --   --   ALT 47  --   --   --   --   --   ALKPHOS 93  --   --   --   --   --   BILITOT 0.5  --   --   --   --   --    ------------------------------------------------------------------------------------------------------------------ estimated creatinine clearance is 41.9 mL/min (by C-G formula based on Cr of 1.47). ------------------------------------------------------------------------------------------------------------------  Recent Labs  10/12/14 1327  HGBA1C 10.7*   ------------------------------------------------------------------------------------------------------------------ No results for input(s): CHOL, HDL, LDLCALC, TRIG, CHOLHDL, LDLDIRECT in the last 72 hours. ------------------------------------------------------------------------------------------------------------------ No results for input(s): TSH, T4TOTAL, T3FREE, THYROIDAB in the last 72 hours.  Invalid input(s): FREET3 ------------------------------------------------------------------------------------------------------------------ No results for input(s): VITAMINB12, FOLATE, FERRITIN, TIBC, IRON, RETICCTPCT in the last 72 hours.  Coagulation profile No results for input(s): INR, PROTIME in the last 168 hours.  No results for input(s): DDIMER in the last 72 hours.  Cardiac Enzymes No results for input(s): CKMB, TROPONINI, MYOGLOBIN in the last 168 hours.  Invalid input(s): CK ------------------------------------------------------------------------------------------------------------------ Invalid input(s): POCBNP     Time Spent in minutes   35   Jilian West K M.D on 10/13/2014 at 10:16 AM  Between 7am to 7pm - Pager - [phone]  After 7pm go to [email or web address] - password [email or web address]  And look for the night coverage person covering for me after hours  [facility] Office  [phone]

## 2014-10-14 LAB — POTASSIUM: Potassium: 5.5 mEq/L — ABNORMAL HIGH (ref 3.7–5.3)

## 2014-10-14 LAB — BASIC METABOLIC PANEL
Anion gap: 14 (ref 5–15)
BUN: 33 mg/dL — ABNORMAL HIGH (ref 6–23)
CO2: 20 mEq/L (ref 19–32)
Calcium: 9.6 mg/dL (ref 8.4–10.5)
Chloride: 98 mEq/L (ref 96–112)
Creatinine, Ser: 1.26 mg/dL (ref 0.50–1.35)
GFR calc Af Amer: 60 mL/min — ABNORMAL LOW (ref >90)
GFR calc non Af Amer: 52 mL/min — ABNORMAL LOW (ref >90)
Glucose, Bld: 284 mg/dL — ABNORMAL HIGH (ref 70–99)
Potassium: 5.5 mEq/L — ABNORMAL HIGH (ref 3.7–5.3)
SODIUM: 132 meq/L — AB (ref 137–147)

## 2014-10-14 LAB — GLUCOSE, CAPILLARY
GLUCOSE-CAPILLARY: 216 mg/dL — AB (ref 70–99)
GLUCOSE-CAPILLARY: 282 mg/dL — AB (ref 70–99)
GLUCOSE-CAPILLARY: 193 mg/dL — AB (ref 70–99)
Glucose-Capillary: 111 mg/dL — ABNORMAL HIGH (ref 70–99)

## 2014-10-14 LAB — TROPONIN I

## 2014-10-14 MED ORDER — SODIUM CHLORIDE 0.9 % IV SOLN
INTRAVENOUS | Status: AC
  Administered 2014-10-14: 18:00:00 via INTRAVENOUS

## 2014-10-14 MED ORDER — INSULIN ASPART PROT & ASPART (70-30 MIX) 100 UNIT/ML ~~LOC~~ SUSP
30.0000 [IU] | Freq: Two times a day (BID) | SUBCUTANEOUS | Status: DC
  Administered 2014-10-14: 30 [IU] via SUBCUTANEOUS
  Filled 2014-10-14: qty 10

## 2014-10-14 MED ORDER — SODIUM POLYSTYRENE SULFONATE 15 GM/60ML PO SUSP
30.0000 g | Freq: Once | ORAL | Status: AC
  Administered 2014-10-14: 30 g via ORAL
  Filled 2014-10-14: qty 120

## 2014-10-14 MED ORDER — INSULIN ASPART PROT & ASPART (70-30 MIX) 100 UNIT/ML ~~LOC~~ SUSP
10.0000 [IU] | Freq: Once | SUBCUTANEOUS | Status: AC
  Administered 2014-10-14: 10 [IU] via SUBCUTANEOUS
  Filled 2014-10-14: qty 10

## 2014-10-14 MED ORDER — FUROSEMIDE 10 MG/ML IJ SOLN
20.0000 mg | Freq: Once | INTRAMUSCULAR | Status: AC
  Administered 2014-10-14: 20 mg via INTRAVENOUS
  Filled 2014-10-14: qty 2

## 2014-10-14 NOTE — Progress Notes (Signed)
Patient is been compliant with bed alarm and call bell. And is independent. He request not to be treated handicapped because he is blind.  Patient oriented to the room and can navigate independently with  His cane/walking stick.

## 2014-10-14 NOTE — Progress Notes (Signed)
Patient arrived as a transferred in from 3S . Patient alert and oriented x4, Patient oriented to room and unit protocol.  Pt is blind and bed alarm set and he is aware to call for assist.  Skin is intact.

## 2014-10-14 NOTE — Progress Notes (Signed)
Patient Demographics  Eddie Graves, is a 80 y.o. male, DOB - Feb 17, 1931, RAQ:[id number]  Admit date - 10/11/2014   Admitting Physician Lynden Oxford, MD  Outpatient Primary MD for the patient is REED, TIFFANY, DO  LOS - 3   Chief Complaint  Patient presents with  . Tremors  . Spasms        Subjective:   Eddie Graves today has, No headache, No chest pain, No abdominal pain - No Nausea, No new weakness tingling or numbness, No Cough - SOB.    Assessment & Plan   1. Mild DKA in a type II diabetic. Gap closed DKA resolved, he is noncompliant with his diabetic regimen and diet control. Consult diabetic educator. Placed him on 70/30 insulin twice a day adjusted dose, continue with sliding scale.  Lab Results  Component Value Date   HGBA1C 10.7* 10/12/2014    CBG (last 3)   Recent Labs  10/13/14 1659 10/13/14 2154 10/14/14 0749  GLUCAP 290* 278* 216*     2. Left arm twitching. Recent diagnosis of partial seizure. Was noncompliant with Keppra, Keppra resume twitching resolved. Case management consulted to assist with outpatient medications.   3. Essential hypertension. On Norvasc and beta blocker. Stable continue, hold ACE/ARB due to ARF.   4.  Hyponatremia with ARF. Due to #1. Has been hydrated, renal function and renal ultrasound stable, creatinine back to NML. Hold ACE.   5. COPD. Stable no acute issues. No wheezing.   6.High K - Kayexalate, repeat K.     Code Status: full  Family Communication: none present  Disposition Plan: home   Procedures Renal US   Consults  DM educator   Medications  Scheduled Meds: . amLODipine  2.5 mg Oral Daily  . etodolac  400 mg Oral BID  . heparin  5,000 Units Subcutaneous 3 times per day  . insulin aspart  0-5 Units  Subcutaneous QHS  . insulin aspart  0-9 Units Subcutaneous TID WC  . insulin aspart protamine- aspart  10 Units Subcutaneous Once  . insulin aspart protamine- aspart  30 Units Subcutaneous BID WC  . levETIRAcetam  1,000 mg Oral Daily  . linagliptin  5 mg Oral Daily  . metoprolol succinate  50 mg Oral Daily  . nicotine  14 mg Transdermal Daily  . nitroGLYCERIN  0.4 mg Transdermal Daily  . rosuvastatin  20 mg Oral Daily  Continuous Infusions:  PRN Meds:.albuterol, dextrose, HYDROcodone-acetaminophen, ipratropium-albuterol  DVT Prophylaxis    Heparin    Lab Results  Component Value Date   PLT 208 10/12/2014    Antibiotics     Anti-infectives    None          Objective:   Filed Vitals:   10/13/14 1841 10/13/14 2158 10/14/14 0543 10/14/14 0900  BP: 118/73 128/63 120/70 136/68  Pulse: 75 72 69 76  Temp:  98.5 F (36.9 C) 98.5 F (36.9 C)   TempSrc:  Oral Oral   Resp:  16 16   Height:      Weight:      SpO2:  98% 100%     Wt Readings from Last 3 Encounters:  10/12/14 92.4 kg (203 lb 11.3 oz)  09/28/14 95.255 kg (210 lb)  08/27/14 94.802 kg (209 lb)     Intake/Output Summary (Last 24 hours) at 10/14/14 1001 Last data filed at 10/14/14 0930  Gross per 24 hour  Intake    600 ml  Output    750 ml  Net   -150 ml     Physical Exam  Awake Alert, Oriented X 3, No new F.N deficits, Normal affect,legally blind [place].AT,PERRAL Supple Neck,No JVD, No cervical lymphadenopathy appriciated.  Symmetrical Chest wall movement, Good air movement bilaterally, CTAB RRR,No Gallops,Rubs or new Murmurs, No Parasternal Heave +ve B.Sounds, Abd Soft, No tenderness, No organomegaly appriciated, No rebound - guarding or rigidity. No Cyanosis, Clubbing or edema, No new Rash or bruise     Data Review   Micro Results Recent Results (from the past 240 hour(s))  MRSA PCR Screening     Status: None   Collection Time: 10/12/14  3:00 PM  Result Value Ref Range Status   MRSA by PCR  NEGATIVE NEGATIVE Final    Comment:        The GeneXpert MRSA Assay (FDA approved for NASAL specimens only), is one component of a comprehensive MRSA colonization surveillance program. It is not intended to diagnose MRSA infection nor to guide or monitor treatment for MRSA infections.     Radiology Reports    US Renal  10/12/2014   CLINICAL DATA:  Acute renal insufficiency  EXAM: RENAL/URINARY TRACT ULTRASOUND COMPLETE  COMPARISON:  None.  FINDINGS: Right Kidney:  Length: 12.9 cm. Echogenicity and renal cortical thickness are within normal limits. No perinephric fluid or pelvicaliectasis visualized. There is a simple cyst arising from the upper pole the right kidney measuring 4.3 x 3.5 x 3.9 cm. No other renal mass seen. There is a column of Bertin on the right, an anatomic variant. No sonographically demonstrable calculus or ureterectasis.  Left Kidney:  Length: 11.7 cm. Echogenicity and renal cortical thickness are within normal limits. No perinephric fluid or pelvicaliectasis visualized. There is a simple cyst arising from the lower pole left kidney measuring 1.2 x 1.3 x 1.3 cm. No other renal mass seen. No sonographically demonstrable calculus or ureterectasis.  Bladder:  Appears normal for degree of bladder distention.  IMPRESSION: Renal cyst on each side.  Study otherwise unremarkable.   Electronically Signed   By: Bretta Bang M.D.   On: 10/12/2014 09:41     CBC  Recent Labs Lab 10/11/14 2148 10/12/14 0358  WBC 8.3 9.6  HGB 13.3 13.6  HCT 39.5 39.6  PLT 191 208  MCV 90.2 87.2  MCH 30.4 30.0  MCHC 33.7 34.3  RDW 12.5 12.2  LYMPHSABS 1.7  --   MONOABS 0.7  --  EOSABS 0.1  --   BASOSABS 0.0  --     Chemistries   Recent Labs Lab 10/11/14 2148  10/12/14 0538 10/12/14 0758 10/12/14 0951 10/13/14 0250 10/14/14 0420  NA 121*  < > 132* 134* 130* 130* 132*  K 5.6*  < > 4.9 4.8 4.8 5.0 5.5*  CL 84*  < > 96 98 95* 94* 98  CO2 20  < > 22 21 21 23 20   GLUCOSE  769*  < > 327* 193* 260* 338* 284*  BUN 51*  < > 48* 44* 44* 40* 33*  CREATININE 2.13*  < > 1.72* 1.62* 1.44* 1.47* 1.26  CALCIUM 9.7  < > 9.8 9.7 9.7 9.3 9.6  AST 39*  --   --   --   --   --   --   ALT 47  --   --   --   --   --   --   ALKPHOS 93  --   --   --   --   --   --   BILITOT 0.5  --   --   --   --   --   --   < > = values in this interval not displayed. ------------------------------------------------------------------------------------------------------------------ estimated creatinine clearance is 48.9 mL/min (by C-G formula based on Cr of 1.26). ------------------------------------------------------------------------------------------------------------------  Recent Labs  10/12/14 1327  HGBA1C 10.7*   ------------------------------------------------------------------------------------------------------------------ No results for input(s): CHOL, HDL, LDLCALC, TRIG, CHOLHDL, LDLDIRECT in the last 72 hours. ------------------------------------------------------------------------------------------------------------------ No results for input(s): TSH, T4TOTAL, T3FREE, THYROIDAB in the last 72 hours.  Invalid input(s): FREET3 ------------------------------------------------------------------------------------------------------------------ No results for input(s): VITAMINB12, FOLATE, FERRITIN, TIBC, IRON, RETICCTPCT in the last 72 hours.  Coagulation profile No results for input(s): INR, PROTIME in the last 168 hours.  No results for input(s): DDIMER in the last 72 hours.  Cardiac Enzymes  Recent Labs Lab 10/13/14 2239 10/14/14 0420  TROPONINI <0.30 <0.30   ------------------------------------------------------------------------------------------------------------------ Invalid input(s): POCBNP     Time Spent in minutes   35   Tinzlee Craker K M.D on 10/14/2014 at 10:01 AM  Between 7am to 7pm - Pager - [phone]  After 7pm go to [email or web address] - password  [email or web address]  And look for the night coverage person covering for me after hours  [facility] Office  [phone]

## 2014-10-14 NOTE — Plan of Care (Signed)
Problem: Phase I Progression Outcomes Goal: CBGs steadily decreasing with treatment Outcome: Completed/Met Date Met:  10/14/14

## 2014-10-15 ENCOUNTER — Ambulatory Visit: Payer: Medicare Other | Admitting: Internal Medicine

## 2014-10-15 LAB — POTASSIUM: Potassium: 4.4 mEq/L (ref 3.7–5.3)

## 2014-10-15 LAB — GLUCOSE, CAPILLARY
GLUCOSE-CAPILLARY: 166 mg/dL — AB (ref 70–99)
Glucose-Capillary: 227 mg/dL — ABNORMAL HIGH (ref 70–99)

## 2014-10-15 MED ORDER — INSULIN ASPART PROT & ASPART (70-30 MIX) 100 UNIT/ML ~~LOC~~ SUSP: 20.0000 [IU] | Freq: Two times a day (BID) | SUBCUTANEOUS | Status: DC

## 2014-10-15 MED ORDER — INSULIN ASPART PROT & ASPART (70-30 MIX) 100 UNIT/ML ~~LOC~~ SUSP
30.0000 [IU] | Freq: Two times a day (BID) | SUBCUTANEOUS | Status: DC
  Administered 2014-10-15: 30 [IU] via SUBCUTANEOUS
  Filled 2014-10-15: qty 10

## 2014-10-15 MED ORDER — FREESTYLE SYSTEM KIT: 1.0000 | PACK | Freq: Three times a day (TID) | Status: DC

## 2014-10-15 MED ORDER — AMLODIPINE BESYLATE 10 MG PO TABS: 10.0000 mg | ORAL_TABLET | Freq: Every day | ORAL | Status: DC

## 2014-10-15 MED ORDER — METOPROLOL SUCCINATE ER 25 MG PO TB24
25.0000 mg | ORAL_TABLET | Freq: Every day | ORAL | Status: DC
  Administered 2014-10-15: 25 mg via ORAL
  Filled 2014-10-15: qty 1

## 2014-10-15 MED ORDER — METOPROLOL SUCCINATE ER 25 MG PO TB24: 25.0000 mg | ORAL_TABLET | Freq: Every day | ORAL | Status: DC

## 2014-10-15 MED ORDER — LISINOPRIL 2.5 MG PO TABS: 2.5000 mg | ORAL_TABLET | Freq: Every day | ORAL | Status: DC

## 2014-10-15 MED ORDER — LEVETIRACETAM ER 500 MG PO TB24
1000.0000 mg | ORAL_TABLET | Freq: Every day | ORAL | Status: DC
Start: 2014-10-15 — End: 2014-10-18

## 2014-10-15 MED ORDER — INSULIN ASPART PROT & ASPART (70-30 MIX) 100 UNIT/ML PEN: 30.0000 [IU] | PEN_INJECTOR | Freq: Two times a day (BID) | SUBCUTANEOUS | Status: DC

## 2014-10-15 MED ORDER — "INSULIN SYRINGE-NEEDLE U-100 25G X 1"" 1 ML MISC": Status: DC

## 2014-10-15 NOTE — Progress Notes (Signed)
Lorin Glass to be D/C'd Home per MD order.  Discussed with the patient and all questions fully answered.    Medication List    STOP taking these medications        metFORMIN 500 MG tablet  Commonly known as:  GLUCOPHAGE      TAKE these medications        acetaminophen 500 MG tablet  Commonly known as:  TYLENOL  Take 500 mg by mouth every 8 (eight) hours as needed for pain.  Notes to Patient:  AS NEEDED every 8 hours      albuterol 108 (90 BASE) MCG/ACT inhaler  Commonly known as:  PROVENTIL HFA;VENTOLIN HFA  Inhale 2 puffs into the lungs every 2 (two) hours as needed for wheezing or shortness of breath.     amLODipine 10 MG tablet  Commonly known as:  NORVASC  Take 1 tablet (10 mg total) by mouth daily.     CRESTOR 20 MG tablet  Generic drug:  rosuvastatin  Take 20 mg by mouth daily.     esomeprazole 40 MG packet  Commonly known as:  NEXIUM  Take 40 mg by mouth daily before breakfast.     etodolac 400 MG tablet  Commonly known as:  LODINE  Take 400 mg by mouth 2 (two) times daily.     GARLIC PO  Take 75 mg by mouth daily.     glucose monitoring kit monitoring kit  1 each by Does not apply route 4 (four) times daily - after meals and at bedtime. 1 month Diabetic Testing Supplies for QAC-QHS accuchecks.Any brand OK     HYDROcodone-acetaminophen 10-325 MG per tablet  Commonly known as:  NORCO  Take 1 tablet by mouth every 6 (six) hours as needed (pain).     Insulin Aspart Prot & Aspart (70-30) 100 UNIT/ML Pen  Commonly known as:  NOVOLOG MIX 70/30 FLEXPEN  Inject 30 Units into the skin 2 (two) times daily.     Insulin Syringe-Needle U-100 25G X 1" 1 ML Misc  Any brand, for 4 times a day insulin SQ, 1 month supply.     levETIRAcetam 500 MG 24 hr tablet  Commonly known as:  KEPPRA XR  Take 2 tablets (1,000 mg total) by mouth daily.  Notes to Patient:  EXAMPLE 10AM AND 10PM     linagliptin 5 MG Tabs tablet  Commonly known as:  TRADJENTA  Take 1 tablet (5 mg  total) by mouth daily.     lisinopril 2.5 MG tablet  Commonly known as:  PRINIVIL,ZESTRIL  Take 1 tablet (2.5 mg total) by mouth daily. FOR BLOOD PRESSURE     metoprolol succinate 25 MG 24 hr tablet  Commonly known as:  TOPROL-XL  Take 1 tablet (25 mg total) by mouth daily.     multivitamin with minerals Tabs tablet  Take 1 tablet by mouth daily.     nicotine 14 mg/24hr patch  Commonly known as:  NICODERM CQ - dosed in mg/24 hours  Place 1 patch (14 mg total) onto the skin daily.     nitroGLYCERIN 0.4 mg/hr patch  Commonly known as:  NITRODUR - Dosed in mg/24 hr  Place 1 patch onto the skin daily.     Omega 3 1200 MG Caps  Take 2,400 mg by mouth daily.     Vitamin D 2000 UNITS Caps  Take 1 capsule by mouth daily.        VVS, Skin clean, dry and intact without evidence  of skin break down, no evidence of skin tears noted. IV catheter discontinued intact. Site without signs and symptoms of complications. Dressing and pressure applied.  An After Visit Summary was printed and given to the patient.  D/c education completed with patient/family including follow up instructions, medication list, d/c activities limitations if indicated, with other d/c instructions as indicated by MD - patient able to verbalize understanding, all questions fully answered.   Patient instructed to return to ED, call 911, or call MD for any changes in condition.   Patient escorted via [place], and D/C home via private auto.  Delman Cheadle 10/15/2014 11:49 AM

## 2014-10-15 NOTE — Discharge Instructions (Signed)
Follow with Primary MD REED, TIFFANY, DO in 3 days   Get CBC, CMP, 2 view Chest X ray checked  by Primary MD next visit.    Activity: As tolerated with Full fall precautions use walker/cane & assistance as needed   Disposition Home     Diet: Heart Healthy Low Carb  Accuchecks 4 times/day, Once in AM empty stomach and then before each meal. Log in all results and show them to your Prim.MD in 3 days. If any glucose reading is under 80 or above 300 call your Prim MD immidiately. Follow Low glucose instructions for glucose under 80 as instructed. .  For Heart failure patients - Check your Weight same time everyday, if you gain over 2 pounds, or you develop in leg swelling, experience more shortness of breath or chest pain, call your Primary MD immediately. Follow Cardiac Low Salt Diet and 1.8 lit/day fluid restriction.   On your next visit with your primary care physician please Get Medicines reviewed and adjusted.   Please request your Prim.MD to go over all Hospital Tests and Procedure/Radiological results at the follow up, please get all Hospital records sent to your Prim MD by signing hospital release before you go home.   If you experience worsening of your admission symptoms, develop shortness of breath, life threatening emergency, suicidal or homicidal thoughts you must seek medical attention immediately by calling 911 or calling your MD immediately  if symptoms less severe.  You Must read complete instructions/literature along with all the possible adverse reactions/side effects for all the Medicines you take and that have been prescribed to you. Take any new Medicines after you have completely understood and accpet all the possible adverse reactions/side effects.   Do not drive, operating heavy machinery, perform activities at heights, swimming or participation in water activities or provide baby sitting services if your were admitted for syncope or siezures until you have seen  by Primary MD or a Neurologist and advised to do so again.  Do not drive when taking Pain medications.    Do not take more than prescribed Pain, Sleep and Anxiety Medications  Special Instructions: If you have smoked or chewed Tobacco  in the last 2 yrs please stop smoking, stop any regular Alcohol  and or any Recreational drug use.  Wear Seat belts while driving.   Please note  You were cared for by a hospitalist during your hospital stay. If you have any questions about your discharge medications or the care you received while you were in the hospital after you are discharged, you can call the unit and asked to speak with the hospitalist on call if the hospitalist that took care of you is not available. Once you are discharged, your primary care physician will handle any further medical issues. Please note that NO REFILLS for any discharge medications will be authorized once you are discharged, as it is imperative that you return to your primary care physician (or establish a relationship with a primary care physician if you do not have one) for your aftercare needs so that they can reassess your need for medications and monitor your lab values.

## 2014-10-15 NOTE — Discharge Summary (Addendum)
Eddie Graves, is a 80 y.o. male  DOB 06-24-1931  MRN [id number].  Admission date:  10/11/2014  Admitting Physician  Berle Mull, MD  Discharge Date:  10/15/2014   Primary MD  Hollace Kinnier, DO  Recommendations for primary care physician for things to follow:    follow Glycemic control closely   Admission Diagnosis  Hyponatremia [E87.1] Spasm [R25.2] Hyperglycemia [R73.9] Elevated serum creatinine [R74.8] Acute kidney injury [N17.9]   Discharge Diagnosis  Hyponatremia [E87.1] Spasm [R25.2] Hyperglycemia [R73.9] Elevated serum creatinine [R74.8] Acute kidney injury [N17.9]    Principal Problem:   DKA (diabetic ketoacidoses) Active Problems:   COPD bronchitis   Essential hypertension, benign   Tobacco abuse   Diabetic peripheral neuropathy associated with type 2 diabetes mellitus   Blind   Hyperglycemia   Involuntary muscle contractions      Past Medical History  Diagnosis Date  . DM (diabetes mellitus) type II controlled peripheral vascular disorder   . Essential hypertension, benign   . Blind   . Headache(784.0)   . Sebaceous cyst   . Chronic airway obstruction, not elsewhere classified   . Cough   . Spinal stenosis, lumbar region, with neurogenic claudication   . Coronary atherosclerosis of native coronary artery   . Vitamin D deficiency   . Rash and other nonspecific skin eruption   . Other and unspecified hyperlipidemia   . Anxiety state, unspecified   . Depressive disorder, not elsewhere classified   . Legal blindness, as defined in [place]   . Reflux esophagitis   . Unspecified constipation   . Pain in joint, site unspecified   . Memory loss   . Palpitations   . Type II diabetes mellitus with neurological manifestations, uncontrolled   . H/O hiatal hernia     Past Surgical History  Procedure  Laterality Date  . Coronary angioplasty with stent placement    . Hernia repair    . Eye surgery         History of present illness and  Hospital Course:     Kindly see H&P for history of present illness and admission details, please review complete Labs, Consult reports and Test reports for all details in brief  HPI  from the history and physical done on the day of admission  Eddie Graves is a 80 y.o. male with Past medical history of diabetes mellitus, hypertension, blind bilaterally, coronary artery disease, GERD, memory issue.  The patient presents with complaints of left arm involuntary movement that has started since last one week. He mentions he was admitted in the hospital in September with similar symptoms and was started on Keppra. Last night when his son was riding his medication they found that his Route did not have any refill and since that Route was not in his pillbox.  He denies any fever or chills complaints of a cough without any expectoration denies any burning urination or diarrhea. Denies any swelling of his legs.  His medications are provided by his son and he denies any  recent change in that arrangement. He has been undergoing significant stress at home due to family issues.  The patient is coming from home. And at his baseline independent for most of his ADL.   Hospital Course   1. Mild DKA in a type II diabetic. Gap closed DKA resolved, he is noncompliant with his diabetic regimen and diet control. Consulted diabetic educator. Placed him on 70/30 insulin twice with good defect, diabetic educator saw the patient and educated him how to use insulin pens safely. He is currently living with his son who will help him administer insulin. Home RN also requested upon discharge. Provided with testing supplies.   Recent Labs    Lab Results  Component Value Date   HGBA1C 10.7* 10/12/2014       2. Left arm twitching. Recent diagnosis of partial seizure.  Was noncompliant with Keppra, Keppra resume twitching resolved. Case management consulted to assist with outpatient medications.    3. Essential hypertension. Stable dropped ACE inhibitor due to acute renal failure and hyperkalemia and beta blocker dose due to type I AV block with a normal heart rate and no symptoms , have increased Norvasc does. Request PCP to monitor blood pressure and adjust medications as appropriate.    4. Hyponatremia with ARF. Due to #1. Has been hydrated, renal function and renal ultrasound stable, creatinine back to NML. He also had hypernatremia. I have dropped his ACE dose and increased Norvasc.    5. COPD. Stable no acute issues. No wheezing.    6.High K - Resolved Post Gertie Exon, request PCP to monitor potassium closely as he is on ACE inhibitor.      Discharge Condition: Stable   Follow UP  Follow-up Information    Follow up with REED, TIFFANY, DO. Schedule an appointment as soon as possible for a visit in 3 days.   Specialty:  Geriatric Medicine   Contact information:   [address] [place] [address] [phone]         Discharge Instructions  and  Discharge Medications          Discharge Instructions    Discharge instructions    Complete by:  As directed   Follow with Primary MD REED, TIFFANY, DO in 3 days   Get CBC, CMP, 2 view Chest X ray checked  by Primary MD next visit.    Activity: As tolerated with Full fall precautions use walker/cane & assistance as needed   Disposition Home     Diet: Heart Healthy Low Carb  Accuchecks 4 times/day, Once in AM empty stomach and then before each meal. Log in all results and show them to your Prim.MD in 3 days. If any glucose reading is under 80 or above 300 call your Prim MD immidiately. Follow Low glucose instructions for glucose under 80 as instructed. .  For Heart failure patients - Check your Weight same time everyday, if you gain over 2 pounds, or you develop in leg  swelling, experience more shortness of breath or chest pain, call your Primary MD immediately. Follow Cardiac Low Salt Diet and 1.8 lit/day fluid restriction.   On your next visit with your primary care physician please Get Medicines reviewed and adjusted.   Please request your Prim.MD to go over all Hospital Tests and Procedure/Radiological results at the follow up, please get all Hospital records sent to your Prim MD by signing hospital release before you go home.   If you experience worsening of your admission symptoms,  develop shortness of breath, life threatening emergency, suicidal or homicidal thoughts you must seek medical attention immediately by calling 911 or calling your MD immediately  if symptoms less severe.  You Must read complete instructions/literature along with all the possible adverse reactions/side effects for all the Medicines you take and that have been prescribed to you. Take any new Medicines after you have completely understood and accpet all the possible adverse reactions/side effects.   Do not drive, operating heavy machinery, perform activities at heights, swimming or participation in water activities or provide baby sitting services if your were admitted for syncope or siezures until you have seen by Primary MD or a Neurologist and advised to do so again.  Do not drive when taking Pain medications.    Do not take more than prescribed Pain, Sleep and Anxiety Medications  Special Instructions: If you have smoked or chewed Tobacco  in the last 2 yrs please stop smoking, stop any regular Alcohol  and or any Recreational drug use.  Wear Seat belts while driving.   Please note  You were cared for by a hospitalist during your hospital stay. If you have any questions about your discharge medications or the care you received while you were in the hospital after you are discharged, you can call the unit and asked to speak with the hospitalist on call if the hospitalist  that took care of you is not available. Once you are discharged, your primary care physician will handle any further medical issues. Please note that NO REFILLS for any discharge medications will be authorized once you are discharged, as it is imperative that you return to your primary care physician (or establish a relationship with a primary care physician if you do not have one) for your aftercare needs so that they can reassess your need for medications and monitor your lab values.     Increase activity slowly    Complete by:  As directed             Medication List    STOP taking these medications        metFORMIN 500 MG tablet  Commonly known as:  GLUCOPHAGE      TAKE these medications        acetaminophen 500 MG tablet  Commonly known as:  TYLENOL  Take 500 mg by mouth every 8 (eight) hours as needed for pain.     albuterol 108 (90 BASE) MCG/ACT inhaler  Commonly known as:  PROVENTIL HFA;VENTOLIN HFA  Inhale 2 puffs into the lungs every 2 (two) hours as needed for wheezing or shortness of breath.     amLODipine 10 MG tablet  Commonly known as:  NORVASC  Take 1 tablet (10 mg total) by mouth daily.     CRESTOR 20 MG tablet  Generic drug:  rosuvastatin  Take 20 mg by mouth daily.     esomeprazole 40 MG packet  Commonly known as:  NEXIUM  Take 40 mg by mouth daily before breakfast.     etodolac 400 MG tablet  Commonly known as:  LODINE  Take 400 mg by mouth 2 (two) times daily.     GARLIC PO  Take 75 mg by mouth daily.     glucose monitoring kit monitoring kit  1 each by Does not apply route 4 (four) times daily - after meals and at bedtime. 1 month Diabetic Testing Supplies for QAC-QHS accuchecks.Any brand OK     HYDROcodone-acetaminophen 10-325 MG per tablet  Commonly known as:  NORCO  Take 1 tablet by mouth every 6 (six) hours as needed (pain).     Insulin Aspart Prot & Aspart (70-30) 100 UNIT/ML Pen  Commonly known as:  NOVOLOG MIX 70/30 FLEXPEN  Inject 30  Units into the skin 2 (two) times daily.     Insulin Syringe-Needle U-100 25G X 1" 1 ML Misc  Any brand, for 4 times a day insulin SQ, 1 month supply.     levETIRAcetam 500 MG 24 hr tablet  Commonly known as:  KEPPRA XR  Take 2 tablets (1,000 mg total) by mouth daily.     linagliptin 5 MG Tabs tablet  Commonly known as:  TRADJENTA  Take 1 tablet (5 mg total) by mouth daily.     lisinopril 2.5 MG tablet  Commonly known as:  PRINIVIL,ZESTRIL  Take 1 tablet (2.5 mg total) by mouth daily. FOR BLOOD PRESSURE     metoprolol succinate 25 MG 24 hr tablet  Commonly known as:  TOPROL-XL  Take 1 tablet (25 mg total) by mouth daily.     multivitamin with minerals Tabs tablet  Take 1 tablet by mouth daily.     nicotine 14 mg/24hr patch  Commonly known as:  NICODERM CQ - dosed in mg/24 hours  Place 1 patch (14 mg total) onto the skin daily.     nitroGLYCERIN 0.4 mg/hr patch  Commonly known as:  NITRODUR - Dosed in mg/24 hr  Place 1 patch onto the skin daily.     Omega 3 1200 MG Caps  Take 2,400 mg by mouth daily.     Vitamin D 2000 UNITS Caps  Take 1 capsule by mouth daily.          Diet and Activity recommendation: See Discharge Instructions above   Consults obtained -  DM Educator   Major procedures and Radiology Reports - PLEASE review detailed and final reports for all details, in brief -       Dg Epidurography  10/04/2014   CLINICAL DATA:  Cervical radiculopathy. RIGHT-greater-than-LEFT multilevel cervical radiculopathy. C3-C4 disc extrusion. Initial encounter.  EXAM: CERVICAL INTERLAMINAR EPIDURAL INJECTION  FLUOROSCOPY TIME:  0 min 28 seconds  Dose: 0.105 Gycm2  PROCEDURE: Informed verbal written consent was obtained on the first visit. Verbal consent was obtained by Dr. Gerilyn Pilgrim before the procedure. The patient was placed prone on the fluoroscopic table, the C7-T1 interspace was visualized, and the skin was marked. General risks include bleeding, infection, injury to  nerves, blood vessels, and adjacent structures as well as allergic reaction. Specific risks include thecal sac puncture, cord injury, paralysis, up to and including death. We discussed the moderate likelihood of moderate lasting relief/attainment of therapeutic goal. Time out form was completed.  After thorough povidone-iodine scrubbing, the site was draped in sterile fashion. 1% lidocaine was used to anesthetize the skin and deeper soft tissues. An interlaminar approach was performed on the RIGHT at C7-T1. A 20 gauge Crawford epidural needle was advanced using loss-of-resistance technique. The epidural space was identified on first pass without evidence of blood, cerebrospinal fluid, or paresthesias. There was no intravascular or subarachnoid opacification.  Injection of Omnipaque 300 shows a good epidural pattern with spread above and below the level of needle placement, primarily on the RIGHT. No vascular opacification is seen. 1.5 ml of Kenalog 40 (60 mg) mixed with 2 ml of normal saline were then instilled. The procedure was well-tolerated. The patient was able to ambulate to the recovery area and observed  for an appropriate length of time as outlined on the nursing notes. The patient was discharged home with a driver in good condition with detailed patient instructions.  IMPRESSION: Technically successful first epidural injection on the RIGHT at C7-T1.   Electronically Signed   By: Dereck Ligas M.D.   On: 10/04/2014 13:07   US Renal  10/12/2014   CLINICAL DATA:  Acute renal insufficiency  EXAM: RENAL/URINARY TRACT ULTRASOUND COMPLETE  COMPARISON:  None.  FINDINGS: Right Kidney:  Length: 12.9 cm. Echogenicity and renal cortical thickness are within normal limits. No perinephric fluid or pelvicaliectasis visualized. There is a simple cyst arising from the upper pole the right kidney measuring 4.3 x 3.5 x 3.9 cm. No other renal mass seen. There is a column of Bertin on the right, an anatomic variant. No  sonographically demonstrable calculus or ureterectasis.  Left Kidney:  Length: 11.7 cm. Echogenicity and renal cortical thickness are within normal limits. No perinephric fluid or pelvicaliectasis visualized. There is a simple cyst arising from the lower pole left kidney measuring 1.2 x 1.3 x 1.3 cm. No other renal mass seen. No sonographically demonstrable calculus or ureterectasis.  Bladder:  Appears normal for degree of bladder distention.  IMPRESSION: Renal cyst on each side.  Study otherwise unremarkable.   Electronically Signed   By: Lowella Grip M.D.   On: 10/12/2014 09:41    Micro Results      Recent Results (from the past 240 hour(s))  MRSA PCR Screening     Status: None   Collection Time: 10/12/14  3:00 PM  Result Value Ref Range Status   MRSA by PCR NEGATIVE NEGATIVE Final    Comment:        The GeneXpert MRSA Assay (FDA approved for NASAL specimens only), is one component of a comprehensive MRSA colonization surveillance program. It is not intended to diagnose MRSA infection nor to guide or monitor treatment for MRSA infections.        Today   Subjective:   Eddie Graves today has no headache,no chest abdominal pain,no new weakness tingling or numbness, feels much better wants to go home today.    Objective:   Blood pressure 129/82, pulse 84, temperature 98.2 F (36.8 C), temperature source Oral, resp. rate 18, height _0  (1.676 m), weight 92.4 kg (203 lb 11.3 oz), SpO2 99 %.   Intake/Output Summary (Last 24 hours) at 10/15/14 1023 Last data filed at 10/14/14 1358  Gross per 24 hour  Intake    240 ml  Output      0 ml  Net    240 ml    Exam Awake Alert, Oriented x 3, No new F.N deficits, Legally blind, Normal affect [place].AT,PERRAL Supple Neck,No JVD, No cervical lymphadenopathy appriciated.  Symmetrical Chest wall movement, Good air movement bilaterally, CTAB RRR,No Gallops,Rubs or new Murmurs, No Parasternal Heave +ve B.Sounds, Abd Soft, Non  tender, No organomegaly appriciated, No rebound -guarding or rigidity. No Cyanosis, Clubbing or edema, No new Rash or bruise  Data Review   CBC w Diff:  Lab Results  Component Value Date   WBC 9.6 10/12/2014   WBC 6.4 07/26/2014   HGB 13.6 10/12/2014   HCT 39.6 10/12/2014   PLT 208 10/12/2014   LYMPHOPCT 20 10/11/2014   MONOPCT 8 10/11/2014   EOSPCT 1 10/11/2014   BASOPCT 0 10/11/2014    CMP:  Lab Results  Component Value Date   NA 132* 10/14/2014   NA 138 07/26/2014   K  4.4 10/15/2014   CL 98 10/14/2014   CO2 20 10/14/2014   BUN 33* 10/14/2014   BUN 14 07/26/2014   CREATININE 1.26 10/14/2014   PROT 7.3 10/11/2014   PROT 7.4 07/26/2014   ALBUMIN 3.6 10/11/2014   BILITOT 0.5 10/11/2014   ALKPHOS 93 10/11/2014   AST 39* 10/11/2014   ALT 47 10/11/2014  .  Lab Results  Component Value Date   HGBA1C 10.7* 10/12/2014     Total Time in preparing paper work, data evaluation and todays exam - 35 minutes  Thurnell Lose M.D on 10/15/2014 at 10:23 AM  [facility] Office  [phone]

## 2014-10-15 NOTE — Progress Notes (Signed)
Inpatient Diabetes Program Recommendations  AACE/ADA: New Consensus Statement on Inpatient Glycemic Control (2013)  Target Ranges:  Prepandial:   less than 140 mg/dL      Peak postprandial:   less than 180 mg/dL (1-2 hours)      Critically ill patients:  140 - 180 mg/dL   This coordinator met with patient and his son to teach insulin pen use.  The patient was able to "teach back" how to use the insulin pen using a demo pen.  Also educated concerning hypoglycemia S/S and how to treat.  Pt was able to "teach back" the treatment for hypoglycemia.  Stressed importance of monitoring CBGs daily before meals and to take insulin with food.  No questions/concerns at the end of our visit. Thank you  Raoul Pitch BSN, RN,CDE Inpatient Diabetes Coordinator [phone] (team pager)

## 2014-10-15 NOTE — Care Management Note (Signed)
    Page 1 of 1   10/15/2014     11:54:56 AM CARE MANAGEMENT NOTE 10/15/2014  Patient:  [facility]   Account Number:  [id number]  Date Initiated:  10/12/2014  Documentation initiated by:  McNally,Kristin  Subjective/Objective Assessment:   pt admit with dka     Action/Plan:   Pt from home and has assistance from son   Anticipated DC Date:  10/15/2014   Anticipated DC Plan:  HOME W HOME HEALTH SERVICES      DC Planning Services  CM consult      [facility] Choice  HOME HEALTH   Choice offered to / List presented to:  C-1 Patient        HH arranged  HH-1 RN  HH-4 NURSE'S AIDE      HH agency  [facility]   Status of service:  Completed, signed off Medicare Important Message given?  YES (If response is "NO", the following Medicare IM given date fields will be blank) Date Medicare IM given:  10/15/2014 Medicare IM given by:  Letha Cape Date Additional Medicare IM given:   Additional Medicare IM given by:    Discharge Disposition:  HOME W HOME HEALTH SERVICES  Per UR Regulation:  Reviewed for med. necessity/level of care/duration of stay  If discussed at Long Length of Stay Meetings, dates discussed:    Comments:  10/15/14 1153 Letha Cape RN, BSN [phone] patient lives alone, he is blind, he has been started on insulin pen, will need HHRN and aide,  patient chose [facility] , referral made to [facility] , Miiranda notified.  Soc will begin 24-48 hrs post dc.

## 2014-10-16 ENCOUNTER — Telehealth: Payer: Self-pay | Admitting: *Deleted

## 2014-10-16 DIAGNOSIS — M6240 Contracture of muscle, unspecified site: Secondary | ICD-10-CM

## 2014-10-16 DIAGNOSIS — J440 Chronic obstructive pulmonary disease with acute lower respiratory infection: Secondary | ICD-10-CM

## 2014-10-16 DIAGNOSIS — I10 Essential (primary) hypertension: Secondary | ICD-10-CM

## 2014-10-16 DIAGNOSIS — E1140 Type 2 diabetes mellitus with diabetic neuropathy, unspecified: Secondary | ICD-10-CM

## 2014-10-16 NOTE — Telephone Encounter (Signed)
Eddie Graves with [facility] called and just wanted to confirm that we are patient's primary Dr. Quincy Carnes have taken care of patient since being released from Hospital. Confirmed we are the Primary.

## 2014-10-18 ENCOUNTER — Encounter: Payer: Self-pay | Admitting: Internal Medicine

## 2014-10-18 ENCOUNTER — Ambulatory Visit (INDEPENDENT_AMBULATORY_CARE_PROVIDER_SITE_OTHER): Payer: Medicare Other | Admitting: Internal Medicine

## 2014-10-18 VITALS — BP 142/80 | HR 65 | Temp 98.4°F | Resp 18 | Ht 66.0 in | Wt 207.2 lb

## 2014-10-18 DIAGNOSIS — E1142 Type 2 diabetes mellitus with diabetic polyneuropathy: Secondary | ICD-10-CM

## 2014-10-18 DIAGNOSIS — M5387 Other specified dorsopathies, lumbosacral region: Secondary | ICD-10-CM

## 2014-10-18 DIAGNOSIS — M5030 Other cervical disc degeneration, unspecified cervical region: Secondary | ICD-10-CM

## 2014-10-18 DIAGNOSIS — M5432 Sciatica, left side: Secondary | ICD-10-CM

## 2014-10-18 DIAGNOSIS — I25118 Atherosclerotic heart disease of native coronary artery with other forms of angina pectoris: Secondary | ICD-10-CM

## 2014-10-18 DIAGNOSIS — E1141 Type 2 diabetes mellitus with diabetic mononeuropathy: Secondary | ICD-10-CM

## 2014-10-18 DIAGNOSIS — IMO0002 Reserved for concepts with insufficient information to code with codable children: Secondary | ICD-10-CM

## 2014-10-18 DIAGNOSIS — E1149 Type 2 diabetes mellitus with other diabetic neurological complication: Secondary | ICD-10-CM

## 2014-10-18 DIAGNOSIS — G629 Polyneuropathy, unspecified: Secondary | ICD-10-CM

## 2014-10-18 DIAGNOSIS — M5136 Other intervertebral disc degeneration, lumbar region: Secondary | ICD-10-CM

## 2014-10-18 DIAGNOSIS — E1165 Type 2 diabetes mellitus with hyperglycemia: Secondary | ICD-10-CM

## 2014-10-18 DIAGNOSIS — G40109 Localization-related (focal) (partial) symptomatic epilepsy and epileptic syndromes with simple partial seizures, not intractable, without status epilepticus: Secondary | ICD-10-CM

## 2014-10-18 MED ORDER — AMLODIPINE BESYLATE 10 MG PO TABS: 10.0000 mg | ORAL_TABLET | Freq: Every day | ORAL | Status: DC

## 2014-10-18 MED ORDER — LEVETIRACETAM ER 500 MG PO TB24: 1000.0000 mg | ORAL_TABLET | Freq: Every day | ORAL | Status: DC

## 2014-10-18 MED ORDER — SERTRALINE HCL 50 MG PO TABS
25.0000 mg | ORAL_TABLET | Freq: Every day | ORAL | Status: DC
Start: 2014-10-18 — End: 2015-08-16

## 2014-10-18 MED ORDER — HYDROCODONE-ACETAMINOPHEN 10-325 MG PO TABS: 1.0000 | ORAL_TABLET | Freq: Four times a day (QID) | ORAL | Status: DC | PRN

## 2014-10-18 MED ORDER — LISINOPRIL 2.5 MG PO TABS: 2.5000 mg | ORAL_TABLET | Freq: Every day | ORAL | Status: DC

## 2014-10-18 MED ORDER — ASPIRIN EC 81 MG PO TBEC: 81.0000 mg | DELAYED_RELEASE_TABLET | Freq: Every day | ORAL | Status: DC

## 2014-10-18 MED ORDER — METOPROLOL SUCCINATE ER 25 MG PO TB24: 25.0000 mg | ORAL_TABLET | Freq: Every day | ORAL | Status: DC

## 2014-10-18 MED ORDER — LINAGLIPTIN 5 MG PO TABS: 5.0000 mg | ORAL_TABLET | Freq: Every day | ORAL | Status: DC

## 2014-10-18 MED ORDER — NICOTINE 14 MG/24HR TD PT24: 14.0000 mg | MEDICATED_PATCH | Freq: Every day | TRANSDERMAL | Status: DC

## 2014-10-18 NOTE — Progress Notes (Signed)
Patient ID: Eddie Graves, male   DOB: August 09, 1931, 80 y.o.   MRN: [id number]   Location:  [facility]  Code Status: full code  Allergies  Allergen Reactions  . Flexeril [Cyclobenzaprine] Other (See Comments)    delirium    Chief Complaint  Patient presents with  . Hospitalization Follow-up    HPI: Patient is a 80 y.o. black male seen in the office today for hospital f/u.  He had those spasms of his left arm.  They discontinued his metformin again as I have in the past, but he has it back in his meds again today.  Also, his amlodipine was increased from 2.$RemoveBeforeDE'5mg'NXZxgMMcMusUjib$  to $R'10mg'sY$  daily.    Previously got talking meter.  He isn't doing.   His son is now doing the checks for him.   Wasn't trusting any meds since he had hallucinations from one.  His son thinks he wasn't taking any medicine for a couple of weeks and that's how his glucose got so high.  Review of Systems:  Review of Systems  Constitutional: Positive for malaise/fatigue. Negative for fever.  HENT: Negative for congestion.   Eyes:       Blind  Respiratory: Negative for shortness of breath.   Cardiovascular: Negative for chest pain and leg swelling.  Gastrointestinal: Negative for abdominal pain.  Genitourinary: Negative for dysuria.  Musculoskeletal: Negative for myalgias and falls.  Skin: Negative for rash.  Neurological: Positive for tingling and seizures. Negative for dizziness and weakness.       Left arm before visit to ED  Psychiatric/Behavioral: Positive for depression and memory loss.     Past Medical History  Diagnosis Date  . DM (diabetes mellitus) type II controlled peripheral vascular disorder   . Essential hypertension, benign   . Blind   . Headache(784.0)   . Sebaceous cyst   . Chronic airway obstruction, not elsewhere classified   . Cough   . Spinal stenosis, lumbar region, with neurogenic claudication   . Coronary atherosclerosis of native coronary artery   .  Vitamin D deficiency   . Rash and other nonspecific skin eruption   . Other and unspecified hyperlipidemia   . Anxiety state, unspecified   . Depressive disorder, not elsewhere classified   . Legal blindness, as defined in [place]   . Reflux esophagitis   . Unspecified constipation   . Pain in joint, site unspecified   . Memory loss   . Palpitations   . Type II diabetes mellitus with neurological manifestations, uncontrolled   . H/O hiatal hernia     Past Surgical History  Procedure Laterality Date  . Coronary angioplasty with stent placement    . Hernia repair    . Eye surgery      Social History:   reports that he quit smoking about 19 months ago. His smoking use included Cigarettes. He has a 50 pack-year smoking history. He has never used smokeless tobacco. He reports that he does not drink alcohol or use illicit drugs.  Family History  Problem Relation Age of Onset  . Asthma Mother   . Diabetes Son   . Cancer Daughter   . Diabetes Son     Medications: Patient's Medications  New Prescriptions   No medications on file  Previous Medications   ACETAMINOPHEN (TYLENOL) 500 MG TABLET    Take 500 mg by mouth every 8 (eight) hours as needed for pain.    ALBUTEROL (PROVENTIL HFA;VENTOLIN HFA) 108 (90  BASE) MCG/ACT INHALER    Inhale 2 puffs into the lungs every 2 (two) hours as needed for wheezing or shortness of breath.   AMLODIPINE (NORVASC) 10 MG TABLET    Take 1 tablet (10 mg total) by mouth daily.   CHOLECALCIFEROL (VITAMIN D) 2000 UNITS CAPS    Take 1 capsule by mouth daily.   CRESTOR 20 MG TABLET    Take 20 mg by mouth daily.   ESOMEPRAZOLE (NEXIUM) 40 MG PACKET    Take 40 mg by mouth daily before breakfast.   ETODOLAC (LODINE) 400 MG TABLET    Take 400 mg by mouth 2 (two) times daily.   GARLIC PO    Take 75 mg by mouth daily.    GLUCOSE MONITORING KIT (FREESTYLE) MONITORING KIT    1 each by Does not apply route 4 (four) times daily - after meals and at bedtime. 1 month  Diabetic Testing Supplies for QAC-QHS accuchecks.Any brand OK   HYDROCODONE-ACETAMINOPHEN (NORCO) 10-325 MG PER TABLET    Take 1 tablet by mouth every 6 (six) hours as needed (pain).   INSULIN ASPART PROT & ASPART (NOVOLOG MIX 70/30 FLEXPEN) (70-30) 100 UNIT/ML PEN    Inject 30 Units into the skin 2 (two) times daily.   INSULIN SYRINGE-NEEDLE U-100 25G X 1" 1 ML MISC    Any brand, for 4 times a day insulin SQ, 1 month supply.   LEVETIRACETAM (KEPPRA XR) 500 MG 24 HR TABLET    Take 2 tablets (1,000 mg total) by mouth daily.   LINAGLIPTIN (TRADJENTA) 5 MG TABS TABLET    Take 1 tablet (5 mg total) by mouth daily.   LISINOPRIL (PRINIVIL,ZESTRIL) 2.5 MG TABLET    Take 1 tablet (2.5 mg total) by mouth daily. FOR BLOOD PRESSURE   METOPROLOL SUCCINATE (TOPROL-XL) 25 MG 24 HR TABLET    Take 1 tablet (25 mg total) by mouth daily.   MULTIPLE VITAMIN (MULITIVITAMIN WITH MINERALS) TABS    Take 1 tablet by mouth daily.   NICOTINE (NICODERM CQ - DOSED IN MG/24 HOURS) 14 MG/24HR PATCH    Place 1 patch (14 mg total) onto the skin daily.   NITROGLYCERIN (NITRODUR - DOSED IN MG/24 HR) 0.4 MG/HR    Place 1 patch onto the skin daily.   OMEGA 3 1200 MG CAPS    Take 2,400 mg by mouth daily.   Modified Medications   No medications on file  Discontinued Medications   No medications on file   Physical Exam: Filed Vitals:   10/18/14 0740  BP: 142/80  Pulse: 65  Temp: 98.4 F (36.9 C)  TempSrc: Oral  Resp: 18  Height: $Remove'5\' 6"'UNYcyUn$  (1.676 m)  Weight: 207 lb 3.2 oz (93.985 kg)  SpO2: 95%  Physical Exam  Constitutional: He is oriented to person, place, and time. He appears well-developed and well-nourished. No distress.  Cardiovascular: Normal rate, regular rhythm and normal heart sounds.   Pulmonary/Chest: Effort normal and breath sounds normal.  Abdominal: Soft. Bowel sounds are normal. He exhibits no distension and no mass. There is no tenderness.  Musculoskeletal: Normal range of motion.  Uses walking stick    Neurological: He is alert and oriented to person, place, and time.  Skin: Skin is warm and dry.  Psychiatric: He has a normal mood and affect.    Labs reviewed: Basic Metabolic Panel:  Recent Labs  08/19/14 1715  10/12/14 0951 10/13/14 0250 10/14/14 0420 10/14/14 1210 10/15/14 0340  NA  --   < >  130* 130* 132*  --   --   K  --   < > 4.8 5.0 5.5* 5.5* 4.4  CL  --   < > 95* 94* 98  --   --   CO2  --   < > $R'21 23 20  'LB$ --   --   GLUCOSE  --   < > 260* 338* 284*  --   --   BUN  --   < > 44* 40* 33*  --   --   CREATININE 1.04  < > 1.44* 1.47* 1.26  --   --   CALCIUM  --   < > 9.7 9.3 9.6  --   --   TSH 0.728  --   --   --   --   --   --   < > = values in this interval not displayed. Liver Function Tests:  Recent Labs  08/19/14 1715 08/20/14 1235 10/11/14 2148  AST 23 30 39*  ALT 17 20 47  ALKPHOS 86 91 93  BILITOT 0.4 0.6 0.5  PROT 6.5 7.4 7.3  ALBUMIN 3.2* 3.7 3.6   No results for input(s): LIPASE, AMYLASE in the last 8760 hours. No results for input(s): AMMONIA in the last 8760 hours. CBC:  Recent Labs  07/26/14 0947 08/19/14 1210  08/20/14 1235 10/11/14 2148 10/12/14 0358  WBC 6.4 7.3  < > 6.5 8.3 9.6  NEUTROABS 3.2 3.9  --   --  5.8  --   HGB 13.9 12.6*  < > 12.8* 13.3 13.6  HCT 41.6 37.2*  < > 38.9* 39.5 39.6  MCV 90 89.6  < > 89.8 90.2 87.2  PLT 185 193  < > 192 191 208  < > = values in this interval not displayed. Lipid Panel:  Recent Labs  07/26/14 0947  HDL 43  LDLCALC 85  TRIG 120  CHOLHDL 3.5   Lab Results  Component Value Date   HGBA1C 10.7* 10/12/2014    Assessment/Plan 1. Coronary artery disease involving native coronary artery with other forms of angina pectoris -I suspect his arm symptoms might actually be a weird variant of unstable angina -has ntg--advised to take when he's having an episode to see if he gets relief - aspirin EC 81 MG tablet; Take 1 tablet (81 mg total) by mouth daily.  Dispense: 30 tablet; Refill: 3  2. Type  II diabetes mellitus with neurological manifestations, uncontrolled - linagliptin (TRADJENTA) 5 MG TABS tablet; Take 1 tablet (5 mg total) by mouth daily. For diabetes  Dispense: 30 tablet; Refill: 3 - also to be using novolog mix but has not taken any since hospital discharge b/c glucose values have been in 130s-150s per his son--seems unlikely, but he was also taking the metformin that had once again been stopped b/c of his renal function (I previously stopped, but he restarted b/c it was on autorefill) -check glucose before breakfast and supper and record through the weekend, if >400, call on call -if less than that, record readings and bring list or call it in on Monday and I will review when I return to the office and advise on insulin dose--30 bid seems high if those are really his glucose readings postprandially  3. Diabetic peripheral neuropathy associated with type 2 diabetes mellitus -pick up tradjenta and cont bid checks - linagliptin (TRADJENTA) 5 MG TABS tablet; Take 1 tablet (5 mg total) by mouth daily. For diabetes  Dispense: 30 tablet; Refill: 3  4. Lumbar degenerative disc disease -stable follows with Dr. Eulas Post - cont norco through him  5. Degenerative disc disease, cervical -stable, follows with Dr. Eulas Post - cont norco through him  6. Sciatica of left side associated with disorder of lumbosacral spine - stable, follows with Dr. Eulas Post - cont norco thru him--pt to keep locked up b/c he believes his one son stole his pain medication  7. Seizure disorder, focal sensory -left arm, cont keppra  ONE AND 1/2 HOURS WERE SPENT WITH THE PATIENT AND HIS SON REVIEWING HIS MEDICATIONS AND DISPOSING OF MEDS THAT HE SHOULD NOT BE TAKING AND ADDED THE INDICATIONS AGAIN TO HIS MED LIST THAT WERE REMOVED AT [facility].  I ALSO HAD TO REFILL EVERYTHING THAT WAS CHANGED AT [facility] AND REFILLS WERE ALL REMOVED THERE.  Labs/tests ordered:  Keep lab appt for next month Next appt:keep  appt 12/24  Emika Tiano L. Jarissa Sheriff, D.O. [facility] [address], [place] [address] Cell Phone (Mon-Fri 8am-5pm):  [phone] On Call:  [phone] & follow prompts after 5pm & weekends Office Phone:  [phone] Office Fax:  [phone]

## 2014-10-18 NOTE — Patient Instructions (Signed)
Check sugar before breakfast and supper through Sunday.  Call the office on Monday with the results or bring a list by.  Staff will give this to me to review to determine your insulin dose.  If sugars are 400+, call the on call provider at [phone] and follow the prompts.  See the med list on this paper for your up to date list.

## 2014-10-23 ENCOUNTER — Other Ambulatory Visit: Payer: Self-pay | Admitting: Internal Medicine

## 2014-11-14 ENCOUNTER — Other Ambulatory Visit: Payer: Self-pay | Admitting: *Deleted

## 2014-11-14 DIAGNOSIS — IMO0001 Reserved for inherently not codable concepts without codable children: Secondary | ICD-10-CM

## 2014-11-14 MED ORDER — ALBUTEROL SULFATE HFA 108 (90 BASE) MCG/ACT IN AERS: 2.0000 | INHALATION_SPRAY | RESPIRATORY_TRACT | Status: DC | PRN

## 2014-11-14 NOTE — Telephone Encounter (Signed)
[facility] 

## 2014-11-26 ENCOUNTER — Other Ambulatory Visit: Payer: Self-pay | Admitting: Orthopedic Surgery

## 2014-11-26 ENCOUNTER — Other Ambulatory Visit: Payer: Medicare Other

## 2014-11-26 DIAGNOSIS — E1165 Type 2 diabetes mellitus with hyperglycemia: Principal | ICD-10-CM

## 2014-11-26 DIAGNOSIS — M542 Cervicalgia: Secondary | ICD-10-CM

## 2014-11-26 DIAGNOSIS — E1149 Type 2 diabetes mellitus with other diabetic neurological complication: Secondary | ICD-10-CM

## 2014-11-26 DIAGNOSIS — I25110 Atherosclerotic heart disease of native coronary artery with unstable angina pectoris: Secondary | ICD-10-CM

## 2014-11-26 DIAGNOSIS — IMO0002 Reserved for concepts with insufficient information to code with codable children: Secondary | ICD-10-CM

## 2014-11-27 LAB — COMPREHENSIVE METABOLIC PANEL
ALT: 10 IU/L (ref 0–44)
AST: 17 IU/L (ref 0–40)
Albumin/Globulin Ratio: 1.8 (ref 1.1–2.5)
Albumin: 4.3 g/dL (ref 3.5–4.7)
Alkaline Phosphatase: 86 IU/L (ref 39–117)
BUN/Creatinine Ratio: 9 — ABNORMAL LOW (ref 10–22)
BUN: 11 mg/dL (ref 8–27)
CO2: 25 mmol/L (ref 18–29)
Calcium: 9.5 mg/dL (ref 8.6–10.2)
Chloride: 103 mmol/L (ref 97–108)
Creatinine, Ser: 1.21 mg/dL (ref 0.76–1.27)
GFR calc Af Amer: 65 mL/min/{1.73_m2} (ref >59)
GFR calc non Af Amer: 56 mL/min/{1.73_m2} — ABNORMAL LOW (ref >59)
Globulin, Total: 2.4 g/dL (ref 1.5–4.5)
Glucose: 140 mg/dL — ABNORMAL HIGH (ref 65–99)
Potassium: 4.5 mmol/L (ref 3.5–5.2)
Sodium: 140 mmol/L (ref 134–144)
Total Bilirubin: 0.4 mg/dL (ref 0.0–1.2)
Total Protein: 6.7 g/dL (ref 6.0–8.5)

## 2014-11-27 LAB — CBC WITH DIFFERENTIAL
Basophils Absolute: 0 10*3/uL (ref 0.0–0.2)
Basos: 1 %
Eos: 4 %
Eosinophils Absolute: 0.2 10*3/uL (ref 0.0–0.4)
HCT: 37.5 % (ref 37.5–51.0)
Hemoglobin: 12.2 g/dL — ABNORMAL LOW (ref 12.6–17.7)
Immature Grans (Abs): 0 10*3/uL (ref 0.0–0.1)
Immature Granulocytes: 0 %
Lymphocytes Absolute: 1.8 10*3/uL (ref 0.7–3.1)
Lymphs: 32 %
MCH: 29.6 pg (ref 26.6–33.0)
MCHC: 32.5 g/dL (ref 31.5–35.7)
MCV: 91 fL (ref 79–97)
Monocytes Absolute: 0.7 10*3/uL (ref 0.1–0.9)
Monocytes: 13 %
Neutrophils Absolute: 2.9 10*3/uL (ref 1.4–7.0)
Neutrophils Relative %: 50 %
Platelets: 198 10*3/uL (ref 150–379)
RBC: 4.12 x10E6/uL — ABNORMAL LOW (ref 4.14–5.80)
RDW: 14.2 % (ref 12.3–15.4)
WBC: 5.7 10*3/uL (ref 3.4–10.8)

## 2014-11-27 LAB — LIPID PANEL
Chol/HDL Ratio: 3 ratio units (ref 0.0–5.0)
Cholesterol, Total: 116 mg/dL (ref 100–199)
HDL: 39 mg/dL — ABNORMAL LOW (ref >39)
LDL Calculated: 49 mg/dL (ref 0–99)
Triglycerides: 138 mg/dL (ref 0–149)
VLDL Cholesterol Cal: 28 mg/dL (ref 5–40)

## 2014-11-27 LAB — HEMOGLOBIN A1C
Est. average glucose Bld gHb Est-mCnc: 232 mg/dL
Hgb A1c MFr Bld: 9.7 % — ABNORMAL HIGH (ref 4.8–5.6)

## 2014-11-29 ENCOUNTER — Ambulatory Visit (INDEPENDENT_AMBULATORY_CARE_PROVIDER_SITE_OTHER): Payer: Medicare Other | Admitting: Internal Medicine

## 2014-11-29 ENCOUNTER — Encounter: Payer: Self-pay | Admitting: Internal Medicine

## 2014-11-29 VITALS — BP 130/78 | HR 73 | Temp 98.5°F | Resp 18 | Ht 66.0 in | Wt 210.0 lb

## 2014-11-29 DIAGNOSIS — F32A Depression, unspecified: Secondary | ICD-10-CM

## 2014-11-29 DIAGNOSIS — Z86010 Personal history of colonic polyps: Secondary | ICD-10-CM

## 2014-11-29 DIAGNOSIS — F329 Major depressive disorder, single episode, unspecified: Secondary | ICD-10-CM

## 2014-11-29 DIAGNOSIS — IMO0001 Reserved for inherently not codable concepts without codable children: Secondary | ICD-10-CM

## 2014-11-29 DIAGNOSIS — I509 Heart failure, unspecified: Secondary | ICD-10-CM

## 2014-11-29 DIAGNOSIS — Z0000 Encounter for general adult medical examination without abnormal findings: Secondary | ICD-10-CM

## 2014-11-29 DIAGNOSIS — IMO0002 Reserved for concepts with insufficient information to code with codable children: Secondary | ICD-10-CM

## 2014-11-29 DIAGNOSIS — J449 Chronic obstructive pulmonary disease, unspecified: Secondary | ICD-10-CM

## 2014-11-29 DIAGNOSIS — I25118 Atherosclerotic heart disease of native coronary artery with other forms of angina pectoris: Secondary | ICD-10-CM

## 2014-11-29 DIAGNOSIS — E1165 Type 2 diabetes mellitus with hyperglycemia: Secondary | ICD-10-CM

## 2014-11-29 DIAGNOSIS — E1141 Type 2 diabetes mellitus with diabetic mononeuropathy: Secondary | ICD-10-CM

## 2014-11-29 DIAGNOSIS — Z23 Encounter for immunization: Secondary | ICD-10-CM

## 2014-11-29 DIAGNOSIS — H547 Unspecified visual loss: Secondary | ICD-10-CM

## 2014-11-29 DIAGNOSIS — M5387 Other specified dorsopathies, lumbosacral region: Secondary | ICD-10-CM

## 2014-11-29 DIAGNOSIS — I110 Hypertensive heart disease with heart failure: Secondary | ICD-10-CM

## 2014-11-29 DIAGNOSIS — H540 Blindness, both eyes: Secondary | ICD-10-CM

## 2014-11-29 DIAGNOSIS — E1149 Type 2 diabetes mellitus with other diabetic neurological complication: Secondary | ICD-10-CM

## 2014-11-29 DIAGNOSIS — M5432 Sciatica, left side: Secondary | ICD-10-CM

## 2014-11-29 MED ORDER — GLUCOSE BLOOD VI STRP: ORAL_STRIP | Status: DC

## 2014-11-29 NOTE — Progress Notes (Signed)
Patient ID: Eddie Graves, male   DOB: [birth year], 80 y.o.   MRN: [id number]   Location:  [facility]  Code Status: full code; again reviewed importance of completing advance directive  Allergies  Allergen Reactions  . Flexeril [Cyclobenzaprine] Other (See Comments)    delirium    Chief Complaint  Patient presents with  . Annual Exam    HPI: Patient is a 80 y.o. black male seen in the office today for his annual exam.    Has been checking his bp and has been consistent at home from 546-503 systolic at home.    Temporarily was checking sugars.  Was using [name]'s machine.  Needs test strips.  Checked each morning.  This morning was 163, but rest have been lower like 140s.  Has been consistently less than 150.    Says zoloft is helping a lot.  [name] is too busy taking care of himself and pt wants to apply for medicaid now and asked how to access.  Leg, shoulder and arm hurting.  Dr. Eulas Post sent him to [facility].  Has appt for shot 12/08/14 for neck.  Had previously been out for his back.  Back not bad.  Left leg still painful down from hip.    Has cscope in Jan.  Review of Systems:  ROS   Past Medical History  Diagnosis Date  . DM (diabetes mellitus) type II controlled peripheral vascular disorder   . Essential hypertension, benign   . Blind   . Headache(784.0)   . Sebaceous cyst   . Chronic airway obstruction, not elsewhere classified   . Cough   . Spinal stenosis, lumbar region, with neurogenic claudication   . Coronary atherosclerosis of native coronary artery   . Vitamin D deficiency   . Rash and other nonspecific skin eruption   . Other and unspecified hyperlipidemia   . Anxiety state, unspecified   . Depressive disorder, not elsewhere classified   . Legal blindness, as defined in [place]   . Reflux esophagitis   . Unspecified constipation   . Pain in joint, site unspecified   . Memory loss   .  Palpitations   . Type II diabetes mellitus with neurological manifestations, uncontrolled   . H/O hiatal hernia     Past Surgical History  Procedure Laterality Date  . Coronary angioplasty with stent placement    . Hernia repair    . Eye surgery      Social History:   reports that he quit smoking about 20 months ago. His smoking use included Cigarettes. He has a 50 pack-year smoking history. He has never used smokeless tobacco. He reports that he does not drink alcohol or use illicit drugs.  Family History  Problem Relation Age of Onset  . Asthma Mother   . Diabetes Son   . Cancer Daughter   . Diabetes Son     Medications: Patient's Medications  New Prescriptions   No medications on file  Previous Medications   ACETAMINOPHEN (TYLENOL) 500 MG TABLET    Take 500 mg by mouth every 8 (eight) hours as needed for pain.    ALBUTEROL (PROVENTIL HFA;VENTOLIN HFA) 108 (90 BASE) MCG/ACT INHALER    Inhale 2 puffs into the lungs every 2 (two) hours as needed for wheezing or shortness of breath.   AMLODIPINE (NORVASC) 10 MG TABLET    Take 1 tablet (10 mg total) by mouth daily. For blood pressure  ASPIRIN EC 81 MG TABLET    Take 1 tablet (81 mg total) by mouth daily.   CHOLECALCIFEROL (VITAMIN D) 2000 UNITS CAPS    Take 1 capsule by mouth daily. For vitamin D repletion   CRESTOR 20 MG TABLET    Take 20 mg by mouth daily. For cholesterol   ESOMEPRAZOLE (NEXIUM) 40 MG PACKET    Take 40 mg by mouth daily before breakfast. For reflux   ETODOLAC (LODINE) 400 MG TABLET    Take 400 mg by mouth 2 (two) times daily. For pain   GARLIC PO    Take 75 mg by mouth daily.    GLUCOSE MONITORING KIT (FREESTYLE) MONITORING KIT    1 each by Does not apply route 4 (four) times daily - after meals and at bedtime. 1 month Diabetic Testing Supplies for QAC-QHS accuchecks.Any brand OK   HYDROCODONE-ACETAMINOPHEN (NORCO) 10-325 MG PER TABLET    Take 1 tablet by mouth every 6 (six) hours as needed (pain).   INSULIN  ASPART PROT & ASPART (NOVOLOG MIX 70/30 FLEXPEN) (70-30) 100 UNIT/ML PEN    Inject 30 Units into the skin 2 (two) times daily.   INSULIN SYRINGE-NEEDLE U-100 25G X 1" 1 ML MISC    Any brand, for 4 times a day insulin SQ, 1 month supply.   LEVETIRACETAM (KEPPRA XR) 500 MG 24 HR TABLET    Take 2 tablets (1,000 mg total) by mouth daily. For seizures   LINAGLIPTIN (TRADJENTA) 5 MG TABS TABLET    Take 1 tablet (5 mg total) by mouth daily. For diabetes   LISINOPRIL (PRINIVIL,ZESTRIL) 2.5 MG TABLET    Take 1 tablet (2.5 mg total) by mouth daily. FOR BLOOD PRESSURE and kidney protection   METOPROLOL SUCCINATE (TOPROL-XL) 25 MG 24 HR TABLET    Take 1 tablet (25 mg total) by mouth daily. For blood pressure   MULTIPLE VITAMIN (MULITIVITAMIN WITH MINERALS) TABS    Take 1 tablet by mouth daily.   NEXIUM 40 MG CAPSULE    TAKE 1 CAPULE BY MOUTH ONCE DAILY BEFORE BREAKFAST   NICOTINE (NICODERM CQ - DOSED IN MG/24 HOURS) 14 MG/24HR PATCH    Place 1 patch (14 mg total) onto the skin daily. USE IN PLACE OF SMOKING   NITROGLYCERIN (NITRODUR - DOSED IN MG/24 HR) 0.4 MG/HR    Place 1 patch onto the skin daily. For chest pain   OMEGA 3 1200 MG CAPS    Take 2,400 mg by mouth daily. For cholesterol   SERTRALINE (ZOLOFT) 50 MG TABLET    Take 0.5 tablets (25 mg total) by mouth daily.  Modified Medications   No medications on file  Discontinued Medications   No medications on file    Physical Exam: Filed Vitals:   11/29/14 0914  BP: 130/78  Pulse: 73  Temp: 98.5 F (36.9 C)  TempSrc: Oral  Resp: 18  Height: _0  (1.676 m)  Weight: 210 lb (95.255 kg)  SpO2: 96%  Physical Exam  Labs reviewed: Basic Metabolic Panel:  Recent Labs  08/19/14 1715  10/13/14 0250 10/14/14 0420 10/14/14 1210 10/15/14 0340 11/26/14 1156  NA  --   < > 130* 132*  --   --  140  K  --   < > 5.0 5.5* 5.5* 4.4 4.5  CL  --   < > 94* 98  --   --  103  CO2  --   < > 23 20  --   --  25  GLUCOSE  --   < > 338* 284*  --   --  140*    BUN  --   < > 40* 33*  --   --  11  CREATININE 1.04  < > 1.47* 1.26  --   --  1.21  CALCIUM  --   < > 9.3 9.6  --   --  9.5  TSH 0.728  --   --   --   --   --   --   < > = values in this interval not displayed. Liver Function Tests:  Recent Labs  08/19/14 1715 08/20/14 1235 10/11/14 2148 11/26/14 1156  AST 23 30 39* 17  ALT 17 20 47 10  ALKPHOS 86 91 93 86  BILITOT 0.4 0.6 0.5 0.4  PROT 6.5 7.4 7.3 6.7  ALBUMIN 3.2* 3.7 3.6  --    No results for input(s): LIPASE, AMYLASE in the last 8760 hours. No results for input(s): AMMONIA in the last 8760 hours. CBC:  Recent Labs  08/19/14 1210  10/11/14 2148 10/12/14 0358 11/26/14 1156  WBC 7.3  < > 8.3 9.6 5.7  NEUTROABS 3.9  --  5.8  --  2.9  HGB 12.6*  < > 13.3 13.6 12.2*  HCT 37.2*  < > 39.5 39.6 37.5  MCV 89.6  < > 90.2 87.2 91  PLT 193  < > 191 208 198  < > = values in this interval not displayed. Lipid Panel:  Recent Labs  07/26/14 0947 11/26/14 1156  HDL 43 39*  LDLCALC 85 49  TRIG 120 138  CHOLHDL 3.5 3.0   Lab Results  Component Value Date   HGBA1C 9.7* 11/26/2014    Past Procedures:  Assessment/Plan 1. Type II diabetes mellitus with neurological manifestations, uncontrolled -still uncontrolled but iimproved with some dietary changes and actually checking glucose at leasat the one time per day  2. Coronary artery disease involving native coronary artery with other forms of angina pectoris -no recent chest pains -cont secondary prevention of mi  3. Sciatica of left side associated with disorder of lumbosacral spine -cont f/u with Dr. Eulas Post -improved with injection  4. COPD bronchitis -stable, no recent exacerbations, cont same meds  5. Blind -needs to apply for medicaid to get assistance with glucose checks and wants aide to come cook/clean for him (unsure if that will be possible)  6. Hypertensive heart disease with heart failure -bp at goal today and at home   7. Depression -helped with  zoloft--cont  8. History of colonic polyps -referred to GI for cscope in January  9. Routine general medical examination at a health care facility -all reviewed above -given PREVNAR today -has had other vaccines  Labs/tests ordered: Orders Placed This Encounter  Procedures  . CBC With differential/Platelet    Standing Status: Future     Number of Occurrences:      Standing Expiration Date: 05/31/2015  . Comprehensive metabolic panel    Standing Status: Future     Number of Occurrences:      Standing Expiration Date: 05/31/2015    Order Specific Question:  Has the patient fasted?    Answer:  Yes  . Lipid panel    Standing Status: Future     Number of Occurrences:      Standing Expiration Date: 05/31/2015    Order Specific Question:  Has the patient fasted?    Answer:  Yes  . Hemoglobin A1c  Standing Status: Future     Number of Occurrences:      Standing Expiration Date: 05/31/2015  . Ambulatory referral to Gastroenterology    Referral Priority:  Routine    Referral Type:  Consultation    Referral Reason:  Specialty Services Required    Requested Specialty:  Gastroenterology    Number of Visits Requested:  1    Next appt:  3 mos with labs before  [place] Rhylen Shaheen, D.O. [facility] [address], [place] [address] Cell Phone (Mon-Fri 8am-5pm):  [phone] On Call:  [phone] & follow prompts after 5pm & weekends Office Phone:  [phone] Office Fax:  [phone]

## 2014-12-04 ENCOUNTER — Ambulatory Visit
Admission: RE | Admit: 2014-12-04 | Discharge: 2014-12-04 | Disposition: A | Discharge status: Home / Self Care | Payer: Medicare Other | Source: Ambulatory Visit | Attending: Orthopedic Surgery | Admitting: Orthopedic Surgery

## 2014-12-04 DIAGNOSIS — M6240 Contracture of muscle, unspecified site: Secondary | ICD-10-CM

## 2014-12-04 DIAGNOSIS — M542 Cervicalgia: Secondary | ICD-10-CM

## 2014-12-04 MED ORDER — IOHEXOL 300 MG/ML  SOLN
1.0000 mL | Freq: Once | INTRAMUSCULAR | Status: AC | PRN
  Administered 2014-12-04: 1 mL via EPIDURAL

## 2014-12-04 MED ORDER — TRIAMCINOLONE ACETONIDE 40 MG/ML IJ SUSP (RADIOLOGY)
60.0000 mg | Freq: Once | INTRAMUSCULAR | Status: AC
  Administered 2014-12-04: 60 mg via EPIDURAL

## 2014-12-04 NOTE — Progress Notes (Deleted)
Pt states he has been off Plavix and Wellbutrin for the past 7 days and off ASA for the past 3 days. Discharge instructions explained to pt.

## 2014-12-26 ENCOUNTER — Other Ambulatory Visit: Payer: Self-pay | Admitting: Orthopedic Surgery

## 2014-12-26 DIAGNOSIS — M545 Low back pain, unspecified: Secondary | ICD-10-CM

## 2014-12-28 ENCOUNTER — Other Ambulatory Visit: Payer: Self-pay

## 2015-01-01 DIAGNOSIS — I209 Angina pectoris, unspecified: Secondary | ICD-10-CM | POA: Diagnosis not present

## 2015-01-01 DIAGNOSIS — E1140 Type 2 diabetes mellitus with diabetic neuropathy, unspecified: Secondary | ICD-10-CM | POA: Diagnosis not present

## 2015-01-01 DIAGNOSIS — E119 Type 2 diabetes mellitus without complications: Secondary | ICD-10-CM | POA: Diagnosis not present

## 2015-01-01 DIAGNOSIS — I2510 Atherosclerotic heart disease of native coronary artery without angina pectoris: Secondary | ICD-10-CM | POA: Diagnosis not present

## 2015-01-01 DIAGNOSIS — I10 Essential (primary) hypertension: Secondary | ICD-10-CM | POA: Diagnosis not present

## 2015-01-04 DIAGNOSIS — M5136 Other intervertebral disc degeneration, lumbar region: Secondary | ICD-10-CM | POA: Diagnosis not present

## 2015-01-06 ENCOUNTER — Ambulatory Visit
Admission: RE | Admit: 2015-01-06 | Discharge: 2015-01-06 | Disposition: A | Discharge status: Home / Self Care | Payer: Medicare Other | Source: Ambulatory Visit | Attending: Orthopedic Surgery | Admitting: Orthopedic Surgery

## 2015-01-06 DIAGNOSIS — M5126 Other intervertebral disc displacement, lumbar region: Secondary | ICD-10-CM | POA: Diagnosis not present

## 2015-01-06 DIAGNOSIS — M545 Low back pain, unspecified: Secondary | ICD-10-CM

## 2015-01-06 DIAGNOSIS — M47816 Spondylosis without myelopathy or radiculopathy, lumbar region: Secondary | ICD-10-CM | POA: Diagnosis not present

## 2015-01-07 ENCOUNTER — Ambulatory Visit
Admission: RE | Admit: 2015-01-07 | Discharge: 2015-01-07 | Disposition: A | Discharge status: Home / Self Care | Payer: Medicare Other | Source: Ambulatory Visit | Attending: Orthopedic Surgery | Admitting: Orthopedic Surgery

## 2015-01-07 ENCOUNTER — Other Ambulatory Visit: Payer: Self-pay | Admitting: Orthopedic Surgery

## 2015-01-07 DIAGNOSIS — M25512 Pain in left shoulder: Secondary | ICD-10-CM

## 2015-01-07 DIAGNOSIS — M19012 Primary osteoarthritis, left shoulder: Secondary | ICD-10-CM | POA: Diagnosis not present

## 2015-01-12 ENCOUNTER — Other Ambulatory Visit: Payer: Self-pay | Admitting: Internal Medicine

## 2015-02-05 DIAGNOSIS — M5412 Radiculopathy, cervical region: Secondary | ICD-10-CM | POA: Diagnosis not present

## 2015-02-14 ENCOUNTER — Ambulatory Visit (INDEPENDENT_AMBULATORY_CARE_PROVIDER_SITE_OTHER): Payer: Medicare Other | Admitting: Internal Medicine

## 2015-02-14 ENCOUNTER — Encounter: Payer: Self-pay | Admitting: Internal Medicine

## 2015-02-14 VITALS — BP 138/80 | HR 78 | Temp 97.6°F | Resp 20 | Ht 66.0 in | Wt 209.0 lb

## 2015-02-14 DIAGNOSIS — M47812 Spondylosis without myelopathy or radiculopathy, cervical region: Secondary | ICD-10-CM

## 2015-02-14 DIAGNOSIS — M5136 Other intervertebral disc degeneration, lumbar region: Secondary | ICD-10-CM | POA: Diagnosis not present

## 2015-02-14 DIAGNOSIS — M5387 Other specified dorsopathies, lumbosacral region: Secondary | ICD-10-CM

## 2015-02-14 DIAGNOSIS — M5432 Sciatica, left side: Secondary | ICD-10-CM | POA: Diagnosis not present

## 2015-02-14 DIAGNOSIS — M5030 Other cervical disc degeneration, unspecified cervical region: Secondary | ICD-10-CM | POA: Diagnosis not present

## 2015-02-14 DIAGNOSIS — M48061 Spinal stenosis, lumbar region without neurogenic claudication: Secondary | ICD-10-CM

## 2015-02-14 DIAGNOSIS — M4806 Spinal stenosis, lumbar region: Secondary | ICD-10-CM | POA: Diagnosis not present

## 2015-02-14 MED ORDER — HYDROCODONE-ACETAMINOPHEN 10-325 MG PO TABS: 1.0000 | ORAL_TABLET | Freq: Every day | ORAL | Status: DC | PRN

## 2015-02-14 MED ORDER — HYDROCODONE BITARTRATE ER 20 MG PO T24A: 20.0000 mg | EXTENDED_RELEASE_TABLET | Freq: Every day | ORAL | Status: DC

## 2015-02-14 NOTE — Progress Notes (Signed)
Patient ID: Eddie Graves, male   DOB: 14-Feb-1931, 81 y.o.   MRN: [id number]   Location:  [facility] / [facility]  Code Status: full code  Allergies  Allergen Reactions  . Flexeril [Cyclobenzaprine] Other (See Comments)    delirium    Chief Complaint  Patient presents with  . Medication Management    Patient would like to discuss pain meds.    HPI: Patient is a 81 y.o. black male seen in the office today for acute visit due to uncontrolled pain.  Shoulders, hips and arms are hurting.  Has run out of his pain medication.  Is going for epidural soon.  They work for a while, then wear off.   Gets the injection in his lower back.    Has been putting hot towels on his arms and shoulders.  Washes clothes and dishes.  No major activity since climbed ladder last summer.    Is taking two to three hydrocodone per day-sometimes relieved with tylenol added to it.  Doing etodolac once a day.   Left leg is most problematic but right also hurts.  He had an MRI 01/06/15 by Dr. Eulas Post.    Review of Systems:  Review of Systems  HENT: Negative for hearing loss.   Eyes:       Blind  Respiratory: Negative for shortness of breath.   Cardiovascular: Negative for chest pain.  Gastrointestinal: Negative for abdominal pain.  Musculoskeletal: Positive for myalgias, back pain and neck pain. Negative for falls.  Psychiatric/Behavioral: Negative for depression and memory loss.     Past Medical History  Diagnosis Date  . DM (diabetes mellitus) type II controlled peripheral vascular disorder   . Essential hypertension, benign   . Blind   . Headache(784.0)   . Sebaceous cyst   . Chronic airway obstruction, not elsewhere classified   . Cough   . Spinal stenosis, lumbar region, with neurogenic claudication   . Coronary atherosclerosis of native coronary artery   . Vitamin D deficiency   . Rash and other nonspecific skin eruption   . Other and unspecified hyperlipidemia     . Anxiety state, unspecified   . Depressive disorder, not elsewhere classified   . Legal blindness, as defined in [place]   . Reflux esophagitis   . Unspecified constipation   . Pain in joint, site unspecified   . Memory loss   . Palpitations   . Type II diabetes mellitus with neurological manifestations, uncontrolled   . H/O hiatal hernia     Past Surgical History  Procedure Laterality Date  . Coronary angioplasty with stent placement    . Hernia repair    . Eye surgery      Social History:   reports that he has been smoking Cigarettes.  He has smoked for the past 50 years. He has never used smokeless tobacco. He reports that he does not drink alcohol or use illicit drugs.  Family History  Problem Relation Age of Onset  . Asthma Mother   . Diabetes Son   . Cancer Daughter   . Diabetes Son     Medications: Patient's Medications  New Prescriptions   No medications on file  Previous Medications   ACETAMINOPHEN (TYLENOL) 500 MG TABLET    Take 500 mg by mouth every 8 (eight) hours as needed for pain.    ALBUTEROL (PROVENTIL HFA;VENTOLIN HFA) 108 (90 BASE) MCG/ACT INHALER    Inhale 2 puffs into the lungs every 2 (two)  hours as needed for wheezing or shortness of breath.   AMLODIPINE (NORVASC) 10 MG TABLET    Take 1 tablet (10 mg total) by mouth daily. For blood pressure   ASPIRIN EC 81 MG TABLET    Take 1 tablet (81 mg total) by mouth daily.   CHOLECALCIFEROL (VITAMIN D) 2000 UNITS CAPS    Take 1 capsule by mouth daily. For vitamin D repletion   CRESTOR 20 MG TABLET    Take 20 mg by mouth daily. For cholesterol   ESOMEPRAZOLE (NEXIUM) 40 MG PACKET    Take 40 mg by mouth daily before breakfast. For reflux   ETODOLAC (LODINE) 400 MG TABLET    Take 400 mg by mouth 2 (two) times daily. For pain   GARLIC PO    Take 75 mg by mouth daily.    GLUCOSE BLOOD TEST STRIP    Check glucose twice daily   GLUCOSE MONITORING KIT (FREESTYLE) MONITORING KIT    1 each by Does not apply route 4  (four) times daily - after meals and at bedtime. 1 month Diabetic Testing Supplies for QAC-QHS accuchecks.Any brand OK   HYDROCODONE-ACETAMINOPHEN (NORCO) 10-325 MG PER TABLET    Take 1 tablet by mouth every 6 (six) hours as needed (pain).   INSULIN ASPART PROT & ASPART (NOVOLOG MIX 70/30 FLEXPEN) (70-30) 100 UNIT/ML PEN    Inject 30 Units into the skin 2 (two) times daily.   INSULIN SYRINGE-NEEDLE U-100 25G X 1" 1 ML MISC    Any brand, for 4 times a day insulin SQ, 1 month supply.   LEVETIRACETAM (KEPPRA XR) 500 MG 24 HR TABLET    Take 2 tablets (1,000 mg total) by mouth daily. For seizures   LINAGLIPTIN (TRADJENTA) 5 MG TABS TABLET    Take 1 tablet (5 mg total) by mouth daily. For diabetes   LISINOPRIL (PRINIVIL,ZESTRIL) 10 MG TABLET    take 1 tablet by mouth once daily for blood pressure   METOPROLOL SUCCINATE (TOPROL-XL) 25 MG 24 HR TABLET    Take 1 tablet (25 mg total) by mouth daily. For blood pressure   MULTIPLE VITAMIN (MULITIVITAMIN WITH MINERALS) TABS    Take 1 tablet by mouth daily.   NEXIUM 40 MG CAPSULE    TAKE 1 CAPULE BY MOUTH ONCE DAILY BEFORE BREAKFAST   NICOTINE (NICODERM CQ - DOSED IN MG/24 HOURS) 14 MG/24HR PATCH    Place 1 patch (14 mg total) onto the skin daily. USE IN PLACE OF SMOKING   NITROGLYCERIN (NITRODUR - DOSED IN MG/24 HR) 0.4 MG/HR    Place 1 patch onto the skin daily. For chest pain   OMEGA 3 1200 MG CAPS    Take 2,400 mg by mouth daily. For cholesterol   SERTRALINE (ZOLOFT) 50 MG TABLET    Take 0.5 tablets (25 mg total) by mouth daily.  Modified Medications   No medications on file  Discontinued Medications   LISINOPRIL (PRINIVIL,ZESTRIL) 2.5 MG TABLET    Take 1 tablet (2.5 mg total) by mouth daily. FOR BLOOD PRESSURE and kidney protection     Physical Exam: Filed Vitals:   02/14/15 1517  BP: 138/80  Pulse: 78  Temp: 97.6 F (36.4 C)  TempSrc: Oral  Resp: 20  Height: $Remove'5\' 6"'QctATFh$  (1.676 m)  Weight: 209 lb (94.802 kg)  SpO2: 93%  Physical Exam    Constitutional: He appears well-nourished.  Cardiovascular: Normal rate, regular rhythm and normal heart sounds.   Pulmonary/Chest: Effort normal and breath sounds  normal.  Abdominal: Soft.  Musculoskeletal: He exhibits tenderness.  Of bilateral shoulders, decreased abduction and extension of left shoulder, tenderness in neck  Neurological: He is alert.  Skin: Skin is warm and dry.    Labs reviewed: Basic Metabolic Panel:  Recent Labs  08/19/14 1715  10/13/14 0250 10/14/14 0420 10/14/14 1210 10/15/14 0340 11/26/14 1156  NA  --   < > 130* 132*  --   --  140  K  --   < > 5.0 5.5* 5.5* 4.4 4.5  CL  --   < > 94* 98  --   --  103  CO2  --   < > 23 20  --   --  25  GLUCOSE  --   < > 338* 284*  --   --  140*  BUN  --   < > 40* 33*  --   --  11  CREATININE 1.04  < > 1.47* 1.26  --   --  1.21  CALCIUM  --   < > 9.3 9.6  --   --  9.5  TSH 0.728  --   --   --   --   --   --   < > = values in this interval not displayed. Liver Function Tests:  Recent Labs  08/19/14 1715 08/20/14 1235 10/11/14 2148 11/26/14 1156  AST 23 30 39* 17  ALT 17 20 47 10  ALKPHOS 86 91 93 86  BILITOT 0.4 0.6 0.5 0.4  PROT 6.5 7.4 7.3 6.7  ALBUMIN 3.2* 3.7 3.6  --    No results for input(s): LIPASE, AMYLASE in the last 8760 hours. No results for input(s): AMMONIA in the last 8760 hours. CBC:  Recent Labs  08/19/14 1210  10/11/14 2148 10/12/14 0358 11/26/14 1156  WBC 7.3  < > 8.3 9.6 5.7  NEUTROABS 3.9  --  5.8  --  2.9  HGB 12.6*  < > 13.3 13.6 12.2*  HCT 37.2*  < > 39.5 39.6 37.5  MCV 89.6  < > 90.2 87.2 91  PLT 193  < > 191 208 198  < > = values in this interval not displayed. Lipid Panel:  Recent Labs  07/26/14 0947 11/26/14 1156  CHOL 152 116  HDL 43 39*  LDLCALC 85 49  TRIG 120 138  CHOLHDL 3.5 3.0   Lab Results  Component Value Date   HGBA1C 9.7* 11/26/2014    Past Procedures: 09/10/14:  Cervical spine xrays:  No acute fracture or subluxation. Multilevel degenerative  changes as described above.  01/06/15:  MRI lumbar spine w/o contrast:  Multilevel lumbar spondylosis show some areas of progression and some areas of improvement. In this patient with LEFT lower extremity radiculopathy, the progression at J2-E2 potentially implicates the LEFT S1 and LEFT L5 nerves. The disc extrusion at L4-L5 has Im proved.  Assessment/Plan No problem-specific assessment & plan notes found for this encounter.  Labs/tests ordered:  Orders Placed This Encounter  Procedures  . Basic metabolic panel   Next appt:  46mos and keep lab visit as scheduled  Kaytlyn Din L. Chaquana Nichols, D.O. [facility] [address], [place] [address] Cell Phone (Mon-Fri 8am-5pm):  [phone] On Call:  [phone] & follow prompts after 5pm & weekends Office Phone:  [phone] Office Fax:  [phone]

## 2015-02-15 LAB — BASIC METABOLIC PANEL
BUN/Creatinine Ratio: 13 (ref 10–22)
BUN: 19 mg/dL (ref 8–27)
CO2: 22 mmol/L (ref 18–29)
Calcium: 9.7 mg/dL (ref 8.6–10.2)
Chloride: 100 mmol/L (ref 97–108)
Creatinine, Ser: 1.46 mg/dL — ABNORMAL HIGH (ref 0.76–1.27)
GFR calc Af Amer: 51 mL/min/{1.73_m2} — ABNORMAL LOW (ref >59)
GFR calc non Af Amer: 44 mL/min/{1.73_m2} — ABNORMAL LOW (ref >59)
Glucose: 174 mg/dL — ABNORMAL HIGH (ref 65–99)
Potassium: 4.8 mmol/L (ref 3.5–5.2)
Sodium: 139 mmol/L (ref 134–144)

## 2015-02-18 ENCOUNTER — Other Ambulatory Visit: Payer: Self-pay | Admitting: Orthopedic Surgery

## 2015-02-18 DIAGNOSIS — M542 Cervicalgia: Secondary | ICD-10-CM

## 2015-02-28 ENCOUNTER — Ambulatory Visit
Admission: RE | Admit: 2015-02-28 | Discharge: 2015-02-28 | Disposition: A | Discharge status: Home / Self Care | Payer: Medicare Other | Source: Ambulatory Visit | Attending: Orthopedic Surgery | Admitting: Orthopedic Surgery

## 2015-02-28 DIAGNOSIS — M542 Cervicalgia: Secondary | ICD-10-CM | POA: Diagnosis not present

## 2015-02-28 MED ORDER — TRIAMCINOLONE ACETONIDE 40 MG/ML IJ SUSP (RADIOLOGY)
60.0000 mg | Freq: Once | INTRAMUSCULAR | Status: AC
  Administered 2015-02-28: 60 mg via EPIDURAL

## 2015-02-28 MED ORDER — IOHEXOL 300 MG/ML  SOLN
1.0000 mL | Freq: Once | INTRAMUSCULAR | Status: AC | PRN
  Administered 2015-02-28: 1 mL via EPIDURAL

## 2015-03-05 ENCOUNTER — Telehealth: Payer: Self-pay | Admitting: *Deleted

## 2015-03-05 NOTE — Telephone Encounter (Signed)
[name] called and left message on voicemail  regarding questions regarding patient's medication. Tried calling back and left message for him to return call.

## 2015-03-05 NOTE — Telephone Encounter (Signed)
The new pain medication you prescribed made patient sick to his stomach. Eddie Graves stated patient Had a bad reaction to that medication,  patient got really weak and disoriented. Please Advise.

## 2015-03-06 MED ORDER — BUPRENORPHINE 7.5 MCG/HR TD PTWK: MEDICATED_PATCH | TRANSDERMAL | Status: DC

## 2015-03-06 NOTE — Telephone Encounter (Signed)
After some investigation I found that I had put him on hysingla (longer acting hydrocodone).    Was he also taking the hydrocodone with it?  He was only supposed to take one of those if he had breakthrough pain after using the hysingla.    Stop hysingla.  Let's try him on butrans patch 7.41mcg/hr--change patch weekly (apply to lower back region)--#4.

## 2015-03-06 NOTE — Progress Notes (Signed)
Patient ID: Eddie Graves, male   DOB: 09/04/1931, 80 y.o.   MRN: [id number]  A/P from 02/14/15:   Uncontrolled pain:  Change from hydrocodone to hyslinga.  This may provide better pain control.  We'll see if covered on formulary.  Again reviewed how pt is NOT to take etodolac due to effects on his kidneys.  This has been reviewed numerous times and he keeps coming back with it on his med list from his orthopedist.

## 2015-03-06 NOTE — Telephone Encounter (Signed)
LMOM to return call.

## 2015-03-06 NOTE — Telephone Encounter (Signed)
Spoke with patients son. Patient was only taking Hysingla. Patient's son verbalized stopping Hysingla and rx for Butrans patch printed for pick-up.  Patient's son then proceeded to say he is concerned about his fathers blood sugar being elevated. Blood sugar 334 this morning. Patient got disoriented on pain medications and stopped x 3 days. Patient back on medications x 3 days and would like to know if Dr.Reed has recommendations for elevated blood sugar.  Please advise

## 2015-03-08 NOTE — Telephone Encounter (Signed)
Per Dr.Reed increase Novolog 70/30 to 34 units BID, continue to check blood sugar twice daily. If blood sugar remains elevated through the weekend OV next week.    I called and left message on voicemail for [name] (patients son) with detailed instructions. I instructed [name] to return call if he has questions about instructions.

## 2015-03-11 ENCOUNTER — Other Ambulatory Visit: Payer: Self-pay | Admitting: *Deleted

## 2015-03-11 DIAGNOSIS — IMO0001 Reserved for inherently not codable concepts without codable children: Secondary | ICD-10-CM

## 2015-03-11 MED ORDER — ALBUTEROL SULFATE HFA 108 (90 BASE) MCG/ACT IN AERS: 2.0000 | INHALATION_SPRAY | RESPIRATORY_TRACT | Status: DC | PRN

## 2015-03-11 NOTE — Telephone Encounter (Signed)
[facility] 

## 2015-03-18 ENCOUNTER — Other Ambulatory Visit: Payer: Medicare Other

## 2015-03-19 ENCOUNTER — Other Ambulatory Visit: Payer: Medicare Other

## 2015-03-19 DIAGNOSIS — E1149 Type 2 diabetes mellitus with other diabetic neurological complication: Secondary | ICD-10-CM

## 2015-03-19 DIAGNOSIS — I25118 Atherosclerotic heart disease of native coronary artery with other forms of angina pectoris: Secondary | ICD-10-CM

## 2015-03-19 DIAGNOSIS — IMO0002 Reserved for concepts with insufficient information to code with codable children: Secondary | ICD-10-CM

## 2015-03-19 DIAGNOSIS — I209 Angina pectoris, unspecified: Secondary | ICD-10-CM | POA: Diagnosis not present

## 2015-03-19 DIAGNOSIS — E1165 Type 2 diabetes mellitus with hyperglycemia: Principal | ICD-10-CM

## 2015-03-19 DIAGNOSIS — I2510 Atherosclerotic heart disease of native coronary artery without angina pectoris: Secondary | ICD-10-CM | POA: Diagnosis not present

## 2015-03-19 DIAGNOSIS — E1141 Type 2 diabetes mellitus with diabetic mononeuropathy: Secondary | ICD-10-CM | POA: Diagnosis not present

## 2015-03-20 LAB — COMPREHENSIVE METABOLIC PANEL
ALT: 84 IU/L — ABNORMAL HIGH (ref 0–44)
AST: 56 IU/L — ABNORMAL HIGH (ref 0–40)
Albumin/Globulin Ratio: 1.5 (ref 1.1–2.5)
Albumin: 3.8 g/dL (ref 3.5–4.7)
Alkaline Phosphatase: 87 IU/L (ref 39–117)
BUN/Creatinine Ratio: 12 (ref 10–22)
BUN: 15 mg/dL (ref 8–27)
Bilirubin Total: 0.3 mg/dL (ref 0.0–1.2)
CO2: 21 mmol/L (ref 18–29)
Calcium: 9.5 mg/dL (ref 8.6–10.2)
Chloride: 101 mmol/L (ref 97–108)
Creatinine, Ser: 1.27 mg/dL (ref 0.76–1.27)
GFR calc Af Amer: 61 mL/min/{1.73_m2} (ref >59)
GFR calc non Af Amer: 53 mL/min/{1.73_m2} — ABNORMAL LOW (ref >59)
Globulin, Total: 2.5 g/dL (ref 1.5–4.5)
Glucose: 191 mg/dL — ABNORMAL HIGH (ref 65–99)
Potassium: 4.5 mmol/L (ref 3.5–5.2)
Sodium: 139 mmol/L (ref 134–144)
Total Protein: 6.3 g/dL (ref 6.0–8.5)

## 2015-03-20 LAB — LIPID PANEL
Chol/HDL Ratio: 2.6 ratio units (ref 0.0–5.0)
Cholesterol, Total: 124 mg/dL (ref 100–199)
HDL: 48 mg/dL (ref >39)
LDL Calculated: 52 mg/dL (ref 0–99)
Triglycerides: 122 mg/dL (ref 0–149)
VLDL Cholesterol Cal: 24 mg/dL (ref 5–40)

## 2015-03-20 LAB — CBC WITH DIFFERENTIAL
Basophils Absolute: 0 10*3/uL (ref 0.0–0.2)
Basos: 0 %
Eos: 2 %
Eosinophils Absolute: 0.2 10*3/uL (ref 0.0–0.4)
HCT: 37.3 % — ABNORMAL LOW (ref 37.5–51.0)
Hemoglobin: 12.2 g/dL — ABNORMAL LOW (ref 12.6–17.7)
Immature Grans (Abs): 0 10*3/uL (ref 0.0–0.1)
Immature Granulocytes: 0 %
Lymphocytes Absolute: 2.2 10*3/uL (ref 0.7–3.1)
Lymphs: 32 %
MCH: 29.5 pg (ref 26.6–33.0)
MCHC: 32.7 g/dL (ref 31.5–35.7)
MCV: 90 fL (ref 79–97)
Monocytes Absolute: 0.7 10*3/uL (ref 0.1–0.9)
Monocytes: 10 %
Neutrophils Absolute: 3.8 10*3/uL (ref 1.4–7.0)
Neutrophils Relative %: 56 %
RBC: 4.13 x10E6/uL — ABNORMAL LOW (ref 4.14–5.80)
RDW: 13.2 % (ref 12.3–15.4)
WBC: 6.8 10*3/uL (ref 3.4–10.8)

## 2015-03-20 LAB — HEMOGLOBIN A1C
Est. average glucose Bld gHb Est-mCnc: 278 mg/dL
Hgb A1c MFr Bld: 11.3 % — ABNORMAL HIGH (ref 4.8–5.6)

## 2015-03-21 ENCOUNTER — Ambulatory Visit (INDEPENDENT_AMBULATORY_CARE_PROVIDER_SITE_OTHER): Payer: Medicare Other | Admitting: Internal Medicine

## 2015-03-21 ENCOUNTER — Encounter: Payer: Self-pay | Admitting: Internal Medicine

## 2015-03-21 VITALS — BP 128/70 | HR 77 | Temp 98.4°F | Ht 66.0 in | Wt 203.0 lb

## 2015-03-21 DIAGNOSIS — E1149 Type 2 diabetes mellitus with other diabetic neurological complication: Secondary | ICD-10-CM

## 2015-03-21 DIAGNOSIS — R740 Nonspecific elevation of levels of transaminase and lactic acid dehydrogenase [LDH]: Secondary | ICD-10-CM

## 2015-03-21 DIAGNOSIS — R7401 Elevation of levels of liver transaminase levels: Secondary | ICD-10-CM

## 2015-03-21 DIAGNOSIS — M5432 Sciatica, left side: Secondary | ICD-10-CM

## 2015-03-21 DIAGNOSIS — E1141 Type 2 diabetes mellitus with diabetic mononeuropathy: Secondary | ICD-10-CM

## 2015-03-21 DIAGNOSIS — R569 Unspecified convulsions: Secondary | ICD-10-CM | POA: Diagnosis not present

## 2015-03-21 DIAGNOSIS — M4806 Spinal stenosis, lumbar region: Secondary | ICD-10-CM

## 2015-03-21 DIAGNOSIS — IMO0002 Reserved for concepts with insufficient information to code with codable children: Secondary | ICD-10-CM

## 2015-03-21 DIAGNOSIS — H547 Unspecified visual loss: Secondary | ICD-10-CM

## 2015-03-21 DIAGNOSIS — I25118 Atherosclerotic heart disease of native coronary artery with other forms of angina pectoris: Secondary | ICD-10-CM | POA: Diagnosis not present

## 2015-03-21 DIAGNOSIS — H540 Blindness, both eyes: Secondary | ICD-10-CM

## 2015-03-21 DIAGNOSIS — E1165 Type 2 diabetes mellitus with hyperglycemia: Secondary | ICD-10-CM

## 2015-03-21 DIAGNOSIS — M5387 Other specified dorsopathies, lumbosacral region: Secondary | ICD-10-CM

## 2015-03-21 DIAGNOSIS — M48061 Spinal stenosis, lumbar region without neurogenic claudication: Secondary | ICD-10-CM

## 2015-03-21 MED ORDER — ZOSTER VACCINE LIVE 19400 UNT/0.65ML ~~LOC~~ SOLR: 0.6500 mL | Freq: Once | SUBCUTANEOUS | Status: DC

## 2015-03-21 MED ORDER — LEVETIRACETAM ER 500 MG PO TB24: 1000.0000 mg | ORAL_TABLET | Freq: Every day | ORAL | Status: DC

## 2015-03-21 NOTE — Progress Notes (Signed)
Patient ID: Eddie Graves, male   DOB: [birth year], 81 y.o.   MRN: [id number]   Location:  [facility]  Code Status: full code Goals of Care: Advanced Directive information Does patient have an advance directive?: No, Would patient like information on creating an advanced directive?: Yes - Educational materials given (on more than one occasion, but paperwork not completed)   Allergies  Allergen Reactions  . Flexeril [Cyclobenzaprine] Other (See Comments)    delirium    Chief Complaint  Patient presents with  . Medical Management of Chronic Issues    3 month follow-up, discuss labs (copy printed).   . Bumps    Examine bumps on shoulders and back x 1 week or longer  . Medication Refill    Discuss if patient should be on Keppra- not currently taking     HPI: Patient is a 81 y.o. black male seen in the office today for med mgt of chronic issues.    Drinking sodas, eating sugary, sweet things like crazy for a few months.  Son claims he didn't use the insulin until just a few weeks ago when he got sick.  Says the tradjenta was working for him, but numbers weren't suggesting that here at all and that's why insulin was started.   They did not bring the meter and continue to use the same meter for both of them despite my instructions and providing new meters.   When I gave hyslingla to replace his hydrocodone due to breakthrough pain, they thought that made him sick, but may have been sugar all along.  Son finally started the insulin we had previously ordered at the time when they actually checked his glucose and it was 500.   He now says he quit his sweets diet.  Is down to one regular small soda per day.  Eating fewer sweets--small slice cake.  Breakfast had bacon, eggs, grits around 9am.  Hasn't eaten lunch yet today (afternoon visit).    Only takes one tylenol per day.  Only taking two hydrocodone per day.  Was on 4 at one point.  Didn't pick up  increased dose butrans patch b/c Rx was here not at pharmacy and his son got confused.  Son says oxycontin worked well for him and he gave some to his dad which helped.  Explained that this is not a good medicine for him with his kidneys deteriorating and that he could end up on HD with his son.   Says that he was making too much money to get medicaid to have a nurse long term to check his glucose.   Pt says he will get his niece to come in mon/wed/fri when [name] is at HD to check his glucose and give the insulin.  I decided to send him back to [facility] [facility] (was supposed to previously follow up but never did).    Is to be taking keppra for focal seizures of left arm as emphasized at another visit, but he still didn't take it as directed.     Review of Systems:  Review of Systems  Constitutional: Positive for malaise/fatigue. Negative for fever and chills.  HENT: Negative for hearing loss.   Eyes:       Blind  Respiratory: Positive for shortness of breath.   Cardiovascular: Positive for leg swelling. Negative for chest pain.  Gastrointestinal: Positive for constipation. Negative for abdominal pain.  Genitourinary: Negative for dysuria.  Musculoskeletal: Positive for myalgias, back pain  and joint pain. Negative for falls.  Skin: Negative for rash.  Neurological: Negative for dizziness.  Psychiatric/Behavioral: Positive for depression and memory loss.     Past Medical History  Diagnosis Date  . DM (diabetes mellitus) type II controlled peripheral vascular disorder   . Essential hypertension, benign   . Blind   . Headache(784.0)   . Sebaceous cyst   . Chronic airway obstruction, not elsewhere classified   . Cough   . Spinal stenosis, lumbar region, with neurogenic claudication   . Coronary atherosclerosis of native coronary artery   . Vitamin D deficiency   . Rash and other nonspecific skin eruption   . Other and unspecified hyperlipidemia   . Anxiety state, unspecified   .  Depressive disorder, not elsewhere classified   . Legal blindness, as defined in [place]   . Reflux esophagitis   . Unspecified constipation   . Pain in joint, site unspecified   . Memory loss   . Palpitations   . Type II diabetes mellitus with neurological manifestations, uncontrolled   . H/O hiatal hernia     Past Surgical History  Procedure Laterality Date  . Coronary angioplasty with stent placement    . Hernia repair    . Eye surgery      Social History:   reports that he has been smoking Cigarettes.  He has smoked for the past 50 years. He has never used smokeless tobacco. He reports that he does not drink alcohol or use illicit drugs.  Family History  Problem Relation Age of Onset  . Asthma Mother   . Diabetes Son   . Cancer Daughter   . Diabetes Son     Medications: Patient's Medications  New Prescriptions   No medications on file  Previous Medications   ACETAMINOPHEN (TYLENOL) 500 MG TABLET    Take 500 mg by mouth every 8 (eight) hours as needed for pain.    ALBUTEROL (PROVENTIL HFA;VENTOLIN HFA) 108 (90 BASE) MCG/ACT INHALER    Inhale 2 puffs into the lungs every 2 (two) hours as needed for wheezing or shortness of breath.   AMLODIPINE (NORVASC) 10 MG TABLET    Take 1 tablet (10 mg total) by mouth daily. For blood pressure   ASPIRIN EC 81 MG TABLET    Take 1 tablet (81 mg total) by mouth daily.   BUPRENORPHINE (BUTRANS) 7.5 MCG/HR PTWK    Apply 1 patch weekly to lower back above buttocks   CHOLECALCIFEROL (VITAMIN D) 2000 UNITS CAPS    Take 1 capsule by mouth daily. For vitamin D repletion   CRESTOR 20 MG TABLET    Take 20 mg by mouth daily. For cholesterol   GARLIC PO    Take 75 mg by mouth daily.    GLUCOSE BLOOD TEST STRIP    Check glucose twice daily   GLUCOSE MONITORING KIT (FREESTYLE) MONITORING KIT    1 each by Does not apply route 4 (four) times daily - after meals and at bedtime. 1 month Diabetic Testing Supplies for QAC-QHS accuchecks.Any brand OK    HYDROCODONE-ACETAMINOPHEN (NORCO) 10-325 MG PER TABLET    Take 1 tablet by mouth daily as needed for severe pain (pain).   INSULIN ASPART PROT & ASPART (NOVOLOG MIX 70/30 FLEXPEN) (70-30) 100 UNIT/ML PEN    Inject 30 Units into the skin 2 (two) times daily.   INSULIN SYRINGE-NEEDLE U-100 25G X 1" 1 ML MISC    Any brand, for 4 times a day insulin SQ,  1 month supply.   LEVETIRACETAM (KEPPRA XR) 500 MG 24 HR TABLET    Take 2 tablets (1,000 mg total) by mouth daily. For seizures   LINAGLIPTIN (TRADJENTA) 5 MG TABS TABLET    Take 1 tablet (5 mg total) by mouth daily. For diabetes   LISINOPRIL (PRINIVIL,ZESTRIL) 10 MG TABLET    take 1 tablet by mouth once daily for blood pressure   METOPROLOL SUCCINATE (TOPROL-XL) 25 MG 24 HR TABLET    Take 1 tablet (25 mg total) by mouth daily. For blood pressure   MULTIPLE VITAMIN (MULITIVITAMIN WITH MINERALS) TABS    Take 1 tablet by mouth daily.   NEXIUM 40 MG CAPSULE    TAKE 1 CAPULE BY MOUTH ONCE DAILY BEFORE BREAKFAST   NICOTINE (NICODERM CQ - DOSED IN MG/24 HOURS) 14 MG/24HR PATCH    Place 1 patch (14 mg total) onto the skin daily. USE IN PLACE OF SMOKING   NITROGLYCERIN (NITRODUR - DOSED IN MG/24 HR) 0.4 MG/HR    Place 1 patch onto the skin daily. For chest pain   OMEGA 3 1200 MG CAPS    Take 2,400 mg by mouth daily. For cholesterol   SERTRALINE (ZOLOFT) 50 MG TABLET    Take 0.5 tablets (25 mg total) by mouth daily.  Modified Medications   Modified Medication Previous Medication   ZOSTER VACCINE LIVE, PF, (ZOSTAVAX) 17711 UNT/0.65ML INJECTION zoster vaccine live, PF, (ZOSTAVAX) 65790 UNT/0.65ML injection      Inject 19,400 Units into the skin once.    Inject 0.65 mLs into the skin once.  Discontinued Medications   ESOMEPRAZOLE (NEXIUM) 40 MG PACKET    Take 40 mg by mouth daily before breakfast. For reflux   HYDROCODONE BITARTRATE ER (HYSINGLA ER) 20 MG T24A    Take 20 mg by mouth daily.     Physical Exam: Filed Vitals:   03/21/15 1454  BP: 128/70    Pulse: 77  Temp: 98.4 F (36.9 C)  TempSrc: Oral  Height: _0  (1.676 m)  Weight: 203 lb (92.08 kg)  SpO2: 97%  Physical Exam  Constitutional: He is oriented to person, place, and time. He appears well-developed and well-nourished. No distress.  overweight black male, walks with walking stick and son's assistance  Cardiovascular: Normal rate, regular rhythm, normal heart sounds and intact distal pulses.   Pulmonary/Chest: Effort normal and breath sounds normal. No respiratory distress. He has no rales.  Abdominal: Soft. Bowel sounds are normal. He exhibits no distension and no mass. There is no tenderness.  Musculoskeletal: Normal range of motion. He exhibits tenderness.  Of lumbar paravertebral regions and sacroiliac areas  Neurological: He is alert and oriented to person, place, and time.  Skin: Skin is warm and dry.  Psychiatric: He has a normal mood and affect.    Labs reviewed: Basic Metabolic Panel:  Recent Labs  08/19/14 1715  11/26/14 1156 02/14/15 1623 03/19/15 0905  NA  --   < > 140 139 139  K  --   < > 4.5 4.8 4.5  CL  --   < > 103 100 101  CO2  --   < > _1 GLUCOSE  --   < > 140* 174* 191*  BUN  --   < > _2 CREATININE 1.04  < > 1.21 1.46* 1.27  CALCIUM  --   < > 9.5 9.7 9.5  TSH 0.728  --   --   --   --   < > =  values in this interval not displayed. Liver Function Tests:  Recent Labs  08/19/14 1715 08/20/14 1235 10/11/14 2148 11/26/14 1156 03/19/15 0905  AST 23 30 39* 17 56*  ALT 17 20 47 10 84*  ALKPHOS 86 91 93 86 87  BILITOT 0.4 0.6 0.5 0.4 0.3  PROT 6.5 7.4 7.3 6.7 6.3  ALBUMIN 3.2* 3.7 3.6  --   --    No results for input(s): LIPASE, AMYLASE in the last 8760 hours. No results for input(s): AMMONIA in the last 8760 hours. CBC:  Recent Labs  10/11/14 2148 10/12/14 0358 11/26/14 1156 03/19/15 0905  WBC 8.3 9.6 5.7 6.8  NEUTROABS 5.8  --  2.9 3.8  HGB 13.3 13.6 12.2* 12.2*  HCT 39.5 39.6 37.5 37.3*  MCV 90.2 87.2 91 90   PLT 191 208 198  --    Lipid Panel:  Recent Labs  07/26/14 0947 11/26/14 1156 03/19/15 0905  CHOL 152 116 124  HDL 43 39* 48  LDLCALC 85 49 52  TRIG 120 138 122  CHOLHDL 3.5 3.0 2.6   Lab Results  Component Value Date   HGBA1C 11.3* 03/19/2015    Assessment/Plan 1. Type II diabetes mellitus with neurological manifestations, uncontrolled -hba1c has only worsened due to dietary noncompliance and not taking insulin as directed -attempts at getting him additional help into the home have been unsuccessful and his son, Eddie Graves, gets HD three days a week so pt gets no cbgs or insulin those days -previously got meter that spoke to him, but never used -son and grandson bring him fast food and do not buy healthy items despite extensive counseling by me -referred back to [employer] to assist with process b/c they are not listening to me -explained that he feels bad b/c his diabetes is uncontrolled and he must take the insulin to lower his glucose, must take the cbgs so I know what to do to adjust the insulin, must bring the meter to the visits so I can check it, must follow his diet  2. Transaminitis -new onset, will recheck and see if improves, check hep panel to r/o hep c as cause as his son has this and they use the same meter against medical advice - Hepatic Function Panel - Hepatitis Acute Panel  3. Coronary artery disease involving native coronary artery with other forms of angina pectoris -BP is actually at goal even though he is still smoking at times and is noncompliant with low sodium, low fat, low carb diet -cont current meds  4. Spinal stenosis of lumbar region -butrans patch was increased to 7.19mcg and RX was given today as son had never picked it up (see above) -this is to be used in place of hydrocodone -son thinks dad should take oxycontin and has given him some--advised against due to CKD and age  28. Blind -apparently still doesn't qualify for increased help at  home despite this and his diabetes--I am not sure where to turn next and his family has not been stepping up to the plate to assist  6. Sciatica of left side associated with disorder of lumbosacral spine - again, I changed him to butrans patch and initial dose was ineffective so increased to 7.5.Marland KitchenMarland Kitchenwill monitor--hopefully his son puts it on him  7. Focal seizure -once again educated that he needs to take the keppra to prevent his left arm problems/seizures - Rx sent to pharmacy -levETIRAcetam (KEPPRA XR) 500 MG 24 hr tablet; Take 2 tablets (1,000 mg total)  by mouth daily. For seizures  Dispense: 180 tablet; Refill: 1  Not a good candidate for auto-refills due to pt and son both not being capable of managing his medications.  Son asked dad to remind him to check his glucose and give his insulin when dad has some memory loss.  Previously, auto-refills has resulted in problems with specialists providing meds that are not safe for patient which I have previously stopped (etodolac).  Labs/tests ordered:   Orders Placed This Encounter  Procedures  . Hepatic Function Panel  . Hepatitis Acute Panel    Next appt:  1 month with me; also see Cathey asap (daughter will bring him, he says)  Marki Frede L. Kirklin Mcduffee, D.O. [facility] [address], [place] [address] Cell Phone (Mon-Fri 8am-5pm):  [phone] On Call:  [phone] & follow prompts after 5pm & weekends Office Phone:  [phone] Office Fax:  [phone]

## 2015-03-21 NOTE — Patient Instructions (Addendum)
Please bring pill bottles to ALL appointments. This will help with medication management.   Please have your daughter bring you to an appointment with Eddie Graves on Monday morning.

## 2015-03-22 LAB — HEPATITIS PANEL, ACUTE
Hep A IgM: NEGATIVE
Hep B C IgM: NEGATIVE
Hep C Virus Ab: <0.1 s/co ratio (ref 0.0–0.9)
Hepatitis B Surface Ag: NEGATIVE

## 2015-03-22 LAB — HEPATIC FUNCTION PANEL
ALT: 64 IU/L — ABNORMAL HIGH (ref 0–44)
AST: 46 IU/L — ABNORMAL HIGH (ref 0–40)
Albumin: 3.7 g/dL (ref 3.5–4.7)
Alkaline Phosphatase: 97 IU/L (ref 39–117)
Bilirubin Total: 0.2 mg/dL (ref 0.0–1.2)
Bilirubin, Direct: 0.11 mg/dL (ref 0.00–0.40)
Total Protein: 6.7 g/dL (ref 6.0–8.5)

## 2015-03-25 ENCOUNTER — Encounter: Payer: Self-pay | Admitting: Pharmacotherapy

## 2015-03-25 ENCOUNTER — Ambulatory Visit (INDEPENDENT_AMBULATORY_CARE_PROVIDER_SITE_OTHER): Payer: Medicare Other | Admitting: Pharmacotherapy

## 2015-03-25 VITALS — BP 130/70 | HR 74 | Temp 97.9°F | Resp 18 | Ht 66.0 in | Wt 203.8 lb

## 2015-03-25 DIAGNOSIS — I509 Heart failure, unspecified: Secondary | ICD-10-CM

## 2015-03-25 DIAGNOSIS — E1149 Type 2 diabetes mellitus with other diabetic neurological complication: Secondary | ICD-10-CM

## 2015-03-25 DIAGNOSIS — E1141 Type 2 diabetes mellitus with diabetic mononeuropathy: Secondary | ICD-10-CM

## 2015-03-25 DIAGNOSIS — I110 Hypertensive heart disease with heart failure: Secondary | ICD-10-CM | POA: Diagnosis not present

## 2015-03-25 DIAGNOSIS — IMO0002 Reserved for concepts with insufficient information to code with codable children: Secondary | ICD-10-CM

## 2015-03-25 DIAGNOSIS — E1165 Type 2 diabetes mellitus with hyperglycemia: Principal | ICD-10-CM

## 2015-03-25 NOTE — Progress Notes (Signed)
  Subjective:    Eddie Graves is a 81 y.o.African American male who presents for follow-up of Type 2 diabetes mellitus.   His A1C is poor - 11.3%. Has not been taking his insulin prior to his OV with Dr. Mariea Clonts on 03/21/15. Also on Tradjenta. He is blind.  He did not bring his blood glucose meter. He still is not taking his insulin.   He does not qualify for home assistance - income too high. His niece is going to help with his care.  Has false teeth.  Plates not fitting well. Feet feel funny.  Some peripheral neuropathy. Has polydipsia. Down to one soda Nocturia all night. Polyuria noted. Denies peripheral edema.    Review of Systems A comprehensive review of systems was negative except for: Eyes: positive for blind Ears, nose, mouth, throat, and face: positive for dentures not fitting well Genitourinary: positive for nocturia Endocrine: positive for diabetic symptoms including increased fatigue, polydipsia and polyuria    Objective:    BP 130/70 mmHg  Pulse 74  Temp(Src) 97.9 F (36.6 C) (Oral)  Resp 18  Ht 5' 6"$  (1.676 m)  Wt 203 lb 12.8 oz (92.443 kg)  BMI 32.91 kg/m2  SpO2 95%  General:  alert, cooperative and no distress  Oropharynx: normal findings: lips normal without lesions   Eyes:  he is blind   Ears:  external ears normal        Lung: clear to auscultation bilaterally  Heart:  regular rate and rhythm     Extremities: no edema  Skin: dry     Neuro: mental status, speech normal, alert and oriented x3   Lab Review GLUCOSE (mg/dL)  Date Value  03/19/2015 191*  02/14/2015 174*  11/26/2014 140*   GLUCOSE, BLD (mg/dL)  Date Value  10/14/2014 284*  10/13/2014 338*  10/12/2014 260*   CO2 (mmol/L)  Date Value  03/19/2015 21  02/14/2015 22  11/26/2014 25   BUN (mg/dL)  Date Value  03/19/2015 15  02/14/2015 19  11/26/2014 11  10/14/2014 33*  10/13/2014 40*  10/12/2014 44*   CREATININE, SER (mg/dL)  Date Value  03/19/2015 1.27   02/14/2015 1.46*  11/26/2014 1.21       Assessment:    Diabetes Mellitus type II, under poor control. Due to not taking insulin. BP at goal <140/90   Plan:    1.  Rx changes: start 70/30 Mix insulin 30 units with breakfast and supper. 2.  Continue Tradjenta 61m daily. 3.  Counseled on complications of uncontrolled DM. 4.  Provided new One Touch Verio BGM. 5.  Counseled on importance of taking all medications exactly as prescribed. 6.  BP at goal <140/90 7.  RTC in 1 month with meter to assess insulin effectiveness.

## 2015-03-25 NOTE — Patient Instructions (Signed)
Take your insulin 30 units before breakfast and supper

## 2015-04-16 ENCOUNTER — Telehealth: Payer: Self-pay | Admitting: *Deleted

## 2015-04-16 NOTE — Telephone Encounter (Signed)
The hydrocodone was not working for him and that was why we tried something else.  I do not feel comfortable putting him on the medication his son is using and I had already explained that at his appt.  Also his kidney function does not allow me to use a long acting narcotic like oxycontin (what he took that was his son's medication).    Let's begin oxycodone IR 5mg  po q 6 hrs as needed for severe pain, #120.  He should be seen in 1-2 wks to check for efficacy and side effects.  He will need a UDS at that time (he does not need to be aware of that in advance).

## 2015-04-16 NOTE — Telephone Encounter (Signed)
Patient stated that the [facility] Patches were not covered by his insurance and was going to cost him $300. Patient requested the old pain medication Hydrocodone for pain. Patient stated that his son had some pain medication of some kind that he would take as he needed them for pain. Would like his own Rx and requesting the old Rx he used to be on. Please Advise.

## 2015-04-17 NOTE — Telephone Encounter (Signed)
LMOM to return call.

## 2015-04-18 MED ORDER — OXYCODONE HCL 5 MG PO TABS: ORAL_TABLET | ORAL | Status: DC

## 2015-04-18 NOTE — Telephone Encounter (Signed)
Patient Notified and agreed will pick up Rx and keep appointment.

## 2015-04-22 ENCOUNTER — Ambulatory Visit: Payer: Medicare Other | Admitting: Pharmacotherapy

## 2015-04-24 DIAGNOSIS — I10 Essential (primary) hypertension: Secondary | ICD-10-CM | POA: Diagnosis not present

## 2015-04-29 ENCOUNTER — Ambulatory Visit (INDEPENDENT_AMBULATORY_CARE_PROVIDER_SITE_OTHER): Payer: Medicare Other | Admitting: Internal Medicine

## 2015-04-29 ENCOUNTER — Encounter: Payer: Self-pay | Admitting: Internal Medicine

## 2015-04-29 VITALS — BP 124/82 | HR 79 | Temp 98.2°F | Resp 20 | Ht 66.0 in | Wt 215.0 lb

## 2015-04-29 DIAGNOSIS — F32A Depression, unspecified: Secondary | ICD-10-CM

## 2015-04-29 DIAGNOSIS — IMO0001 Reserved for inherently not codable concepts without codable children: Secondary | ICD-10-CM

## 2015-04-29 DIAGNOSIS — I110 Hypertensive heart disease with heart failure: Secondary | ICD-10-CM | POA: Diagnosis not present

## 2015-04-29 DIAGNOSIS — F329 Major depressive disorder, single episode, unspecified: Secondary | ICD-10-CM | POA: Diagnosis not present

## 2015-04-29 DIAGNOSIS — E1165 Type 2 diabetes mellitus with hyperglycemia: Secondary | ICD-10-CM

## 2015-04-29 DIAGNOSIS — J449 Chronic obstructive pulmonary disease, unspecified: Secondary | ICD-10-CM | POA: Diagnosis not present

## 2015-04-29 DIAGNOSIS — I509 Heart failure, unspecified: Secondary | ICD-10-CM | POA: Diagnosis not present

## 2015-04-29 DIAGNOSIS — IMO0002 Reserved for concepts with insufficient information to code with codable children: Secondary | ICD-10-CM

## 2015-04-29 DIAGNOSIS — I25118 Atherosclerotic heart disease of native coronary artery with other forms of angina pectoris: Secondary | ICD-10-CM | POA: Diagnosis not present

## 2015-04-29 DIAGNOSIS — E1141 Type 2 diabetes mellitus with diabetic mononeuropathy: Secondary | ICD-10-CM

## 2015-04-29 DIAGNOSIS — M48061 Spinal stenosis, lumbar region without neurogenic claudication: Secondary | ICD-10-CM

## 2015-04-29 DIAGNOSIS — M4806 Spinal stenosis, lumbar region: Secondary | ICD-10-CM

## 2015-04-29 DIAGNOSIS — E1149 Type 2 diabetes mellitus with other diabetic neurological complication: Secondary | ICD-10-CM

## 2015-04-29 MED ORDER — ALBUTEROL SULFATE HFA 108 (90 BASE) MCG/ACT IN AERS: 2.0000 | INHALATION_SPRAY | RESPIRATORY_TRACT | Status: DC | PRN

## 2015-04-29 MED ORDER — IPRATROPIUM-ALBUTEROL 0.5-2.5 (3) MG/3ML IN SOLN: 3.0000 mL | Freq: Four times a day (QID) | RESPIRATORY_TRACT | Status: DC | PRN

## 2015-04-29 NOTE — Progress Notes (Signed)
Patient ID: Eddie Graves, male   DOB: 21-Oct-1931, 81 y.o.   MRN: [id number]   Location:  [facility]  Code Status: full code Goals of Care: Advanced Directive information Does patient have an advance directive?: No, Would patient like information on creating an advanced directive?: Yes - Educational materials given   Chief Complaint  Patient presents with  . Medical Management of Chronic Issues    1 month follow-up,Discuss labs (copy printed )Patient says that he has never picked up his patch medication  because it cost to much not covered by insurance    HPI: Patient is a 81 y.o. black male seen in the office today for med mgt of chronic diseases--he was seen one month ago, but was not taking his meds or insulin properly.  He claims he is now and is getting his insulin and checking his glucose.  Too early to repeat labs.    Is now taking oxycodone twice a day.  This is giving him pain relief.    Still having urinary frequency.  Glucose readings--"doing a great job" per [employer].  126, 131.  Discussed taking his insulin if over 150.  Denies low ones.  Says lowest he recalls is 122.  Of course, they do not have the machine along.    Bowels are fluctuating between diarrhea and constipation, but good now for 2 wks.    Has always had some difficulty sleeping from time to time.  Has especially been a problem since his wife's death (even before he came to see me).  Reads at night, goes to sleep, but up to bathroom and has to repeat the process.  Has only one chair he doesn't fall asleep in.  Dreams a lot at night--terrible dreams no matter where he is sleeping.  No matter what pillows he sleeps on it's a problem.  Not as bad as they used to be--had been nightmares.    Only smokes when two of his friends that smoke come by.    Mood has been great generally except for today according to [name].    Still didn't get zostavax at pharmacy.  BP normal on  recheck.  Was low at first.  Has been low at home.  109/60s.  Denies feeling lightheaded or dizzy with those readings.    Has been wheezing a lot more lately.  He notices it most in bed lying down.  Tells me now that he used to be on oxygen at one time.  I had not known that this entire time.    Review of Systems:  Review of Systems  Constitutional: Positive for malaise/fatigue. Negative for fever.  HENT: Negative for congestion and hearing loss.   Eyes:       Blind  Respiratory: Positive for cough, shortness of breath and wheezing.   Cardiovascular: Negative for chest pain and leg swelling.  Gastrointestinal: Positive for constipation. Negative for abdominal pain.  Genitourinary: Positive for frequency. Negative for dysuria.  Musculoskeletal: Positive for myalgias, back pain and joint pain. Negative for falls.  Skin: Negative for rash.  Neurological: Negative for dizziness, loss of consciousness, weakness and headaches.  Psychiatric/Behavioral: Positive for depression and memory loss. The patient is nervous/anxious and has insomnia.     Past Medical History  Diagnosis Date  . DM (diabetes mellitus) type II controlled peripheral vascular disorder   . Essential hypertension, benign   . Blind   . Headache(784.0)   . Sebaceous cyst   .  Chronic airway obstruction, not elsewhere classified   . Cough   . Spinal stenosis, lumbar region, with neurogenic claudication   . Coronary atherosclerosis of native coronary artery   . Vitamin D deficiency   . Rash and other nonspecific skin eruption   . Other and unspecified hyperlipidemia   . Anxiety state, unspecified   . Depressive disorder, not elsewhere classified   . Legal blindness, as defined in [place]   . Reflux esophagitis   . Unspecified constipation   . Pain in joint, site unspecified   . Memory loss   . Palpitations   . Type II diabetes mellitus with neurological manifestations, uncontrolled   . H/O hiatal hernia     Past  Surgical History  Procedure Laterality Date  . Coronary angioplasty with stent placement    . Hernia repair    . Eye surgery      Allergies  Allergen Reactions  . Flexeril [Cyclobenzaprine] Other (See Comments)    delirium   Medications: Patient's Medications  New Prescriptions   IPRATROPIUM-ALBUTEROL (DUONEB) 0.5-2.5 (3) MG/3ML SOLN    Take 3 mLs by nebulization every 6 (six) hours as needed (wheezing).  Previous Medications   AMLODIPINE (NORVASC) 10 MG TABLET    Take 1 tablet (10 mg total) by mouth daily. For blood pressure   ASPIRIN EC 81 MG TABLET    Take 1 tablet (81 mg total) by mouth daily.   CHOLECALCIFEROL (VITAMIN D) 2000 UNITS CAPS    Take 1 capsule by mouth daily. For vitamin D repletion   CRESTOR 20 MG TABLET    Take 20 mg by mouth daily. For cholesterol   GARLIC PO    Take 75 mg by mouth daily.    GLUCOSE BLOOD TEST STRIP    Check glucose twice daily   GLUCOSE MONITORING KIT (FREESTYLE) MONITORING KIT    1 each by Does not apply route 4 (four) times daily - after meals and at bedtime. 1 month Diabetic Testing Supplies for QAC-QHS accuchecks.Any brand OK   INSULIN ASPART PROT & ASPART (NOVOLOG MIX 70/30 FLEXPEN) (70-30) 100 UNIT/ML PEN    Inject 30 Units into the skin 2 (two) times daily.   INSULIN SYRINGE-NEEDLE U-100 25G X 1" 1 ML MISC    Any brand, for 4 times a day insulin SQ, 1 month supply.   LEVETIRACETAM (KEPPRA XR) 500 MG 24 HR TABLET    Take 2 tablets (1,000 mg total) by mouth daily. For seizures   LINAGLIPTIN (TRADJENTA) 5 MG TABS TABLET    Take 1 tablet (5 mg total) by mouth daily. For diabetes   LISINOPRIL (PRINIVIL,ZESTRIL) 10 MG TABLET    take 1 tablet by mouth once daily for blood pressure   METOPROLOL SUCCINATE (TOPROL-XL) 25 MG 24 HR TABLET    Take 1 tablet (25 mg total) by mouth daily. For blood pressure   MULTIPLE VITAMIN (MULITIVITAMIN WITH MINERALS) TABS    Take 1 tablet by mouth daily.   NEXIUM 40 MG CAPSULE    TAKE 1 CAPULE BY MOUTH ONCE DAILY  BEFORE BREAKFAST   NITROGLYCERIN (NITRODUR - DOSED IN MG/24 HR) 0.4 MG/HR    Place 1 patch onto the skin daily. For chest pain   OMEGA 3 1200 MG CAPS    Take 2,400 mg by mouth daily. For cholesterol   OXYCODONE (ROXICODONE) 5 MG IMMEDIATE RELEASE TABLET    Take one tablet by mouth every 6 hours as needed for pain   SERTRALINE (ZOLOFT) 50 MG  TABLET    Take 0.5 tablets (25 mg total) by mouth daily.   ZOSTER VACCINE LIVE, PF, (ZOSTAVAX) 08144 UNT/0.65ML INJECTION    Inject 19,400 Units into the skin once.  Modified Medications   Modified Medication Previous Medication   ALBUTEROL (PROVENTIL HFA;VENTOLIN HFA) 108 (90 BASE) MCG/ACT INHALER albuterol (PROVENTIL HFA;VENTOLIN HFA) 108 (90 BASE) MCG/ACT inhaler      Inhale 2 puffs into the lungs every 2 (two) hours as needed for wheezing or shortness of breath.    Inhale 2 puffs into the lungs every 2 (two) hours as needed for wheezing or shortness of breath.  Discontinued Medications   BUPRENORPHINE (BUTRANS) 7.5 MCG/HR PTWK    Apply 1 patch weekly to lower back above buttocks   NICOTINE (NICODERM CQ - DOSED IN MG/24 HOURS) 14 MG/24HR PATCH    Place 1 patch (14 mg total) onto the skin daily. USE IN PLACE OF SMOKING    Physical Exam: Filed Vitals:   04/29/15 1516 04/29/15 1549 04/29/15 1601  BP: 98/76 124/82 124/82  Pulse: 79    Temp: 98.2 F (36.8 C)    TempSrc: Oral    Resp: 20    Height: _0  (1.676 m)    Weight: 215 lb (97.523 kg)    SpO2: 91%     Physical Exam  Constitutional: He appears well-developed and well-nourished. No distress.  Cardiovascular: Normal rate, regular rhythm, normal heart sounds and intact distal pulses.   Pulmonary/Chest: Effort normal. No respiratory distress. He has wheezes.  Abdominal: Soft. Bowel sounds are normal. He exhibits no distension and no mass. There is no tenderness.  Musculoskeletal: Normal range of motion. He exhibits no edema or tenderness.  Neurological: He is alert.  Skin: Skin is warm and dry.     Labs reviewed: Basic Metabolic Panel:  Recent Labs  08/19/14 1715  11/26/14 1156 02/14/15 1623 03/19/15 0905  NA  --   < > 140 139 139  K  --   < > 4.5 4.8 4.5  CL  --   < > 103 100 101  CO2  --   < > _1 GLUCOSE  --   < > 140* 174* 191*  BUN  --   < > _2 CREATININE 1.04  < > 1.21 1.46* 1.27  CALCIUM  --   < > 9.5 9.7 9.5  TSH 0.728  --   --   --   --   < > = values in this interval not displayed. Liver Function Tests:  Recent Labs  08/19/14 1715 08/20/14 1235 10/11/14 2148 11/26/14 1156 03/19/15 0905 03/21/15 1534  AST 23 30 39* 17 56* 46*  ALT 17 20 47 10 84* 64*  ALKPHOS 86 91 93 86 87 97  BILITOT 0.4 0.6 0.5 0.4 0.3 0.2  PROT 6.5 7.4 7.3 6.7 6.3 6.7  ALBUMIN 3.2* 3.7 3.6  --   --   --    No results for input(s): LIPASE, AMYLASE in the last 8760 hours. No results for input(s): AMMONIA in the last 8760 hours. CBC:  Recent Labs  10/11/14 2148 10/12/14 0358 11/26/14 1156 03/19/15 0905  WBC 8.3 9.6 5.7 6.8  NEUTROABS 5.8  --  2.9 3.8  HGB 13.3 13.6 12.2* 12.2*  HCT 39.5 39.6 37.5 37.3*  MCV 90.2 87.2 91 90  PLT 191 208 198  --    Lipid Panel:  Recent Labs  07/26/14 0947 11/26/14 1156 03/19/15 0905  CHOL  152 116 124  HDL 43 39* 48  LDLCALC 85 49 52  TRIG 120 138 122  CHOLHDL 3.5 3.0 2.6   Lab Results  Component Value Date   HGBA1C 11.3* 03/19/2015   Assessment/Plan 1. COPD bronchitis -discussed that this is due to his years of smoking and probably flared up by seasonal allergies as denies cold symptoms, fevers, chills -nebulizer machine order printed and to be faxed by [facility] to [facility] and additional form completed if needed, NPI was included  -RX also faxed for inhaler to his pharmacy and duoneb vials for use in neb machine--explained use to his son - albuterol (PROVENTIL HFA;VENTOLIN HFA) 108 (90 BASE) MCG/ACT inhaler; Inhale 2 puffs into the lungs every 2 (two) hours as needed for wheezing or shortness of breath.  Dispense:  18 g; Refill: 3  2. Type II diabetes mellitus with neurological manifestations, uncontrolled -cont bid insulin for cbgs over 150, has not had lows, also check cbgs twice a day for sure and should bring machine and not share with his son -avoid fast food and sweets, try to walk more  3. Coronary artery disease involving native coronary artery with other forms of angina pectoris -denies recent chest pains, has had dyspnea related to #1  4. Hypertensive heart disease with heart failure -bp at goal today, in fact low at first -advised to let me know if this remains a problem or he develops dizziness, lightheadedness associated with it  5. Spinal stenosis of lumbar region -cont oxycodone for pain twice a day which has been effective -will check UDS next time  6. Depression -cont zoloft which his son feels has helped his spirits.  Labs/tests ordered:   Orders Placed This Encounter  Procedures  . DME Nebulizer machine  . CBC with Differential/Platelet    Standing Status: Future     Number of Occurrences:      Standing Expiration Date: 10/30/2015  . Comprehensive metabolic panel    Standing Status: Future     Number of Occurrences:      Standing Expiration Date: 10/30/2015    Order Specific Question:  Has the patient fasted?    Answer:  Yes  . Hemoglobin A1c    Standing Status: Future     Number of Occurrences:      Standing Expiration Date: 10/30/2015  . Lipid panel    Standing Status: Future     Number of Occurrences:      Standing Expiration Date: 10/30/2015    Order Specific Question:  Has the patient fasted?    Answer:  Yes    Next appt:  3 mos, labs before and sooner prn  Corrie Brannen L. Asiel Chrostowski, D.O. [facility] [address], [place] [address] Cell Phone (Mon-Fri 8am-5pm):  [phone] On Call:  [phone] & follow prompts after 5pm & weekends Office Phone:  [phone] Office Fax:  [phone]

## 2015-05-19 ENCOUNTER — Emergency Department (HOSPITAL_COMMUNITY): Payer: Medicare Other

## 2015-05-19 ENCOUNTER — Encounter (HOSPITAL_COMMUNITY): Payer: Self-pay | Admitting: Physical Medicine and Rehabilitation

## 2015-05-19 ENCOUNTER — Observation Stay (HOSPITAL_COMMUNITY)
Admission: EM | Admit: 2015-05-19 | Discharge: 2015-05-20 | Disposition: A | Discharge status: Home / Self Care | Payer: Medicare Other | Attending: Internal Medicine | Admitting: Internal Medicine

## 2015-05-19 DIAGNOSIS — N183 Chronic kidney disease, stage 3 unspecified: Secondary | ICD-10-CM | POA: Diagnosis present

## 2015-05-19 DIAGNOSIS — R110 Nausea: Secondary | ICD-10-CM | POA: Diagnosis not present

## 2015-05-19 DIAGNOSIS — E1140 Type 2 diabetes mellitus with diabetic neuropathy, unspecified: Secondary | ICD-10-CM | POA: Insufficient documentation

## 2015-05-19 DIAGNOSIS — F329 Major depressive disorder, single episode, unspecified: Secondary | ICD-10-CM | POA: Diagnosis not present

## 2015-05-19 DIAGNOSIS — G40909 Epilepsy, unspecified, not intractable, without status epilepticus: Secondary | ICD-10-CM | POA: Diagnosis not present

## 2015-05-19 DIAGNOSIS — E669 Obesity, unspecified: Secondary | ICD-10-CM | POA: Diagnosis present

## 2015-05-19 DIAGNOSIS — I10 Essential (primary) hypertension: Secondary | ICD-10-CM | POA: Diagnosis present

## 2015-05-19 DIAGNOSIS — E785 Hyperlipidemia, unspecified: Secondary | ICD-10-CM | POA: Insufficient documentation

## 2015-05-19 DIAGNOSIS — E559 Vitamin D deficiency, unspecified: Secondary | ICD-10-CM | POA: Insufficient documentation

## 2015-05-19 DIAGNOSIS — Z886 Allergy status to analgesic agent status: Secondary | ICD-10-CM | POA: Diagnosis not present

## 2015-05-19 DIAGNOSIS — R413 Other amnesia: Secondary | ICD-10-CM | POA: Diagnosis not present

## 2015-05-19 DIAGNOSIS — F17210 Nicotine dependence, cigarettes, uncomplicated: Secondary | ICD-10-CM | POA: Diagnosis not present

## 2015-05-19 DIAGNOSIS — H548 Legal blindness, as defined in USA: Secondary | ICD-10-CM | POA: Insufficient documentation

## 2015-05-19 DIAGNOSIS — Z8619 Personal history of other infectious and parasitic diseases: Secondary | ICD-10-CM

## 2015-05-19 DIAGNOSIS — I9589 Other hypotension: Secondary | ICD-10-CM

## 2015-05-19 DIAGNOSIS — E1165 Type 2 diabetes mellitus with hyperglycemia: Secondary | ICD-10-CM | POA: Diagnosis not present

## 2015-05-19 DIAGNOSIS — Z7982 Long term (current) use of aspirin: Secondary | ICD-10-CM | POA: Diagnosis not present

## 2015-05-19 DIAGNOSIS — N179 Acute kidney failure, unspecified: Secondary | ICD-10-CM | POA: Diagnosis not present

## 2015-05-19 DIAGNOSIS — E1142 Type 2 diabetes mellitus with diabetic polyneuropathy: Secondary | ICD-10-CM | POA: Diagnosis not present

## 2015-05-19 DIAGNOSIS — I959 Hypotension, unspecified: Secondary | ICD-10-CM | POA: Diagnosis present

## 2015-05-19 DIAGNOSIS — M4806 Spinal stenosis, lumbar region: Secondary | ICD-10-CM | POA: Diagnosis not present

## 2015-05-19 DIAGNOSIS — Z8679 Personal history of other diseases of the circulatory system: Secondary | ICD-10-CM

## 2015-05-19 DIAGNOSIS — Z6833 Body mass index (BMI) 33.0-33.9, adult: Secondary | ICD-10-CM | POA: Diagnosis not present

## 2015-05-19 DIAGNOSIS — R0789 Other chest pain: Secondary | ICD-10-CM | POA: Diagnosis not present

## 2015-05-19 DIAGNOSIS — H547 Unspecified visual loss: Secondary | ICD-10-CM | POA: Diagnosis present

## 2015-05-19 DIAGNOSIS — F419 Anxiety disorder, unspecified: Secondary | ICD-10-CM | POA: Diagnosis not present

## 2015-05-19 DIAGNOSIS — I25119 Atherosclerotic heart disease of native coronary artery with unspecified angina pectoris: Secondary | ICD-10-CM | POA: Diagnosis not present

## 2015-05-19 DIAGNOSIS — J449 Chronic obstructive pulmonary disease, unspecified: Secondary | ICD-10-CM | POA: Diagnosis present

## 2015-05-19 DIAGNOSIS — Z794 Long term (current) use of insulin: Secondary | ICD-10-CM | POA: Diagnosis not present

## 2015-05-19 DIAGNOSIS — R079 Chest pain, unspecified: Principal | ICD-10-CM

## 2015-05-19 DIAGNOSIS — I129 Hypertensive chronic kidney disease with stage 1 through stage 4 chronic kidney disease, or unspecified chronic kidney disease: Secondary | ICD-10-CM | POA: Insufficient documentation

## 2015-05-19 DIAGNOSIS — Z79899 Other long term (current) drug therapy: Secondary | ICD-10-CM | POA: Insufficient documentation

## 2015-05-19 DIAGNOSIS — J9811 Atelectasis: Secondary | ICD-10-CM | POA: Diagnosis not present

## 2015-05-19 DIAGNOSIS — I2510 Atherosclerotic heart disease of native coronary artery without angina pectoris: Secondary | ICD-10-CM | POA: Diagnosis present

## 2015-05-19 DIAGNOSIS — N189 Chronic kidney disease, unspecified: Secondary | ICD-10-CM | POA: Insufficient documentation

## 2015-05-19 LAB — URINALYSIS, ROUTINE W REFLEX MICROSCOPIC
Bilirubin Urine: NEGATIVE
Glucose, UA: NEGATIVE mg/dL
Hgb urine dipstick: NEGATIVE
Ketones, ur: NEGATIVE mg/dL
Leukocytes, UA: NEGATIVE
Nitrite: NEGATIVE
Protein, ur: NEGATIVE mg/dL
Specific Gravity, Urine: 1.01 (ref 1.005–1.030)
Urobilinogen, UA: 0.2 mg/dL (ref 0.0–1.0)
pH: 6.5 (ref 5.0–8.0)

## 2015-05-19 LAB — COMPREHENSIVE METABOLIC PANEL
ALT: 15 U/L — ABNORMAL LOW (ref 17–63)
AST: 30 U/L (ref 15–41)
Albumin: 3.4 g/dL — ABNORMAL LOW (ref 3.5–5.0)
Alkaline Phosphatase: 81 U/L (ref 38–126)
Anion gap: 7 (ref 5–15)
BUN: 15 mg/dL (ref 6–20)
CO2: 27 mmol/L (ref 22–32)
Calcium: 9.7 mg/dL (ref 8.9–10.3)
Chloride: 102 mmol/L (ref 101–111)
Creatinine, Ser: 1.81 mg/dL — ABNORMAL HIGH (ref 0.61–1.24)
GFR calc Af Amer: 39 mL/min — ABNORMAL LOW (ref >60)
GFR calc non Af Amer: 33 mL/min — ABNORMAL LOW (ref >60)
Glucose, Bld: 196 mg/dL — ABNORMAL HIGH (ref 65–99)
Potassium: 5 mmol/L (ref 3.5–5.1)
Sodium: 136 mmol/L (ref 135–145)
Total Bilirubin: 0.7 mg/dL (ref 0.3–1.2)
Total Protein: 6.8 g/dL (ref 6.5–8.1)

## 2015-05-19 LAB — CBC
HEMATOCRIT: 37.6 % — AB (ref 39.0–52.0)
HEMOGLOBIN: 12.3 g/dL — AB (ref 13.0–17.0)
MCH: 29 pg (ref 26.0–34.0)
MCHC: 32.7 g/dL (ref 30.0–36.0)
MCV: 88.7 fL (ref 78.0–100.0)
PLATELETS: 201 10*3/uL (ref 150–400)
RBC: 4.24 MIL/uL (ref 4.22–5.81)
RDW: 13.5 % (ref 11.5–15.5)
WBC: 7.9 10*3/uL (ref 4.0–10.5)

## 2015-05-19 LAB — GLUCOSE, CAPILLARY
GLUCOSE-CAPILLARY: 105 mg/dL — AB (ref 65–99)
Glucose-Capillary: 154 mg/dL — ABNORMAL HIGH (ref 65–99)

## 2015-05-19 LAB — TROPONIN I
Troponin I: <0.03 ng/mL (ref <0.031)
Troponin I: <0.03 ng/mL (ref <0.031)

## 2015-05-19 LAB — I-STAT CG4 LACTIC ACID, ED: Lactic Acid, Venous: 0.99 mmol/L (ref 0.5–2.0)

## 2015-05-19 MED ORDER — GI COCKTAIL ~~LOC~~
30.0000 mL | Freq: Four times a day (QID) | ORAL | Status: DC | PRN
  Filled 2015-05-19: qty 30

## 2015-05-19 MED ORDER — ROSUVASTATIN CALCIUM 20 MG PO TABS
20.0000 mg | ORAL_TABLET | Freq: Every day | ORAL | Status: DC
  Administered 2015-05-20: 20 mg via ORAL
  Filled 2015-05-19: qty 1

## 2015-05-19 MED ORDER — ONDANSETRON HCL 4 MG/2ML IJ SOLN: 4.0000 mg | Freq: Four times a day (QID) | INTRAMUSCULAR | Status: DC | PRN

## 2015-05-19 MED ORDER — IPRATROPIUM-ALBUTEROL 0.5-2.5 (3) MG/3ML IN SOLN: 3.0000 mL | Freq: Four times a day (QID) | RESPIRATORY_TRACT | Status: DC | PRN

## 2015-05-19 MED ORDER — LEVETIRACETAM ER 500 MG PO TB24
1000.0000 mg | ORAL_TABLET | Freq: Every day | ORAL | Status: DC
  Administered 2015-05-20: 1000 mg via ORAL
  Filled 2015-05-19: qty 2

## 2015-05-19 MED ORDER — SODIUM CHLORIDE 0.9 % IV BOLUS (SEPSIS)
500.0000 mL | Freq: Once | INTRAVENOUS | Status: AC
  Administered 2015-05-19: 500 mL via INTRAVENOUS

## 2015-05-19 MED ORDER — SERTRALINE HCL 25 MG PO TABS
25.0000 mg | ORAL_TABLET | Freq: Every day | ORAL | Status: DC
  Administered 2015-05-19 – 2015-05-20 (×2): 25 mg via ORAL
  Filled 2015-05-19 (×2): qty 1

## 2015-05-19 MED ORDER — ENOXAPARIN SODIUM 40 MG/0.4ML ~~LOC~~ SOLN
40.0000 mg | SUBCUTANEOUS | Status: DC
  Administered 2015-05-19: 40 mg via SUBCUTANEOUS
  Filled 2015-05-19 (×2): qty 0.4

## 2015-05-19 MED ORDER — IPRATROPIUM BROMIDE 0.02 % IN SOLN
0.5000 mg | Freq: Once | RESPIRATORY_TRACT | Status: AC
  Administered 2015-05-19: 0.5 mg via RESPIRATORY_TRACT
  Filled 2015-05-19: qty 2.5

## 2015-05-19 MED ORDER — OXYCODONE HCL 5 MG PO TABS: 5.0000 mg | ORAL_TABLET | ORAL | Status: DC | PRN

## 2015-05-19 MED ORDER — OMEGA-3-ACID ETHYL ESTERS 1 G PO CAPS
2.0000 g | ORAL_CAPSULE | Freq: Two times a day (BID) | ORAL | Status: DC
  Administered 2015-05-20: 2 g via ORAL
  Filled 2015-05-19 (×2): qty 2

## 2015-05-19 MED ORDER — ALBUTEROL SULFATE (2.5 MG/3ML) 0.083% IN NEBU
5.0000 mg | INHALATION_SOLUTION | Freq: Once | RESPIRATORY_TRACT | Status: AC
  Administered 2015-05-19: 5 mg via RESPIRATORY_TRACT
  Filled 2015-05-19: qty 6

## 2015-05-19 MED ORDER — ALBUTEROL SULFATE HFA 108 (90 BASE) MCG/ACT IN AERS: 2.0000 | INHALATION_SPRAY | RESPIRATORY_TRACT | Status: DC | PRN

## 2015-05-19 MED ORDER — INSULIN ASPART 100 UNIT/ML ~~LOC~~ SOLN: 0.0000 [IU] | Freq: Three times a day (TID) | SUBCUTANEOUS | Status: DC

## 2015-05-19 MED ORDER — SODIUM CHLORIDE 0.9 % IV SOLN
INTRAVENOUS | Status: DC
  Administered 2015-05-19 (×2): via INTRAVENOUS

## 2015-05-19 MED ORDER — ASPIRIN EC 81 MG PO TBEC
81.0000 mg | DELAYED_RELEASE_TABLET | Freq: Every day | ORAL | Status: DC
  Administered 2015-05-19 – 2015-05-20 (×2): 81 mg via ORAL
  Filled 2015-05-19 (×2): qty 1

## 2015-05-19 MED ORDER — INSULIN ASPART 100 UNIT/ML ~~LOC~~ SOLN: 0.0000 [IU] | Freq: Every day | SUBCUTANEOUS | Status: DC

## 2015-05-19 MED ORDER — OMEGA 3 1200 MG PO CAPS: 2400.0000 mg | ORAL_CAPSULE | Freq: Every day | ORAL | Status: DC

## 2015-05-19 MED ORDER — INSULIN ASPART PROT & ASPART (70-30 MIX) 100 UNIT/ML PEN
40.0000 [IU] | PEN_INJECTOR | Freq: Every day | SUBCUTANEOUS | Status: DC
  Filled 2015-05-19: qty 3

## 2015-05-19 MED ORDER — ACETAMINOPHEN 325 MG PO TABS: 650.0000 mg | ORAL_TABLET | ORAL | Status: DC | PRN

## 2015-05-19 MED ORDER — MORPHINE SULFATE 2 MG/ML IJ SOLN: 2.0000 mg | INTRAMUSCULAR | Status: DC | PRN

## 2015-05-19 MED ORDER — LINAGLIPTIN 5 MG PO TABS
5.0000 mg | ORAL_TABLET | Freq: Every day | ORAL | Status: DC
  Administered 2015-05-20: 5 mg via ORAL
  Filled 2015-05-19: qty 1

## 2015-05-19 MED ORDER — PANTOPRAZOLE SODIUM 40 MG PO TBEC
40.0000 mg | DELAYED_RELEASE_TABLET | Freq: Every day | ORAL | Status: DC
  Administered 2015-05-19 – 2015-05-20 (×2): 40 mg via ORAL
  Filled 2015-05-19 (×2): qty 1

## 2015-05-19 NOTE — ED Notes (Signed)
Pt reports midsternal chest pressure radiating to abdomen, onset this morning after church. Pt also states nausea. Denies chest pain upon arrival. Respirations unlabored. Speaking complete sentences. Pt is alert and oriented x4. Skin warm and dry.

## 2015-05-19 NOTE — Progress Notes (Addendum)
Notfied pt son Jorja Loa of pt rm number [id number]. Barbera Setters RN

## 2015-05-19 NOTE — ED Notes (Addendum)
Pt presents to department from home via [facility] for evaluation of diffuse chest pain radiating to abdomen. Onset today. Denies chest pain upon arrival. Also states nausea. Pt is alert and oriented x4. 20g RAC. Received 324ASA and (1) nitroglycerin with relief. CBG 228.

## 2015-05-19 NOTE — H&P (Addendum)
Patient was seen, examined, treatment plan was discussed with the Physician extender. I have directly reviewed the clinical findings, lab, imaging studies and management of this patient in detail. I have made the necessary changes to the above noted documentation, and agree with the documentation, as recorded by the Physician extender.   Brief narrative: 81 year old male patient with past medical history of CAD, diabetes, COPD, blindness, dyslipidemia who presented to [facility] ED with reports of chest pain in the mid sternal area, radiating down to epigastric area, sharp, intermittent and about 7/10 in intensity at worst. Pain was present at rest and with ambulation. It did not resolve with analgesia at home. No associated shortness of breath, palpitations. No fevers or chills. In ED, initial BP was 139/99 but dropped to 74/52 with nitroglycerin. His pain however got better after sublingual nitro. He was then given IV fluids and BP improved to 118/58. CXR showed new basilar atelectasis. The troponin was WNL. The 12 lead EKG showed sinus rhythm with 1st degree AV block. He was admitted for further evaluation of chest pain.   Assessment & Plan  Principal Problem: Chest pain/known CAD and cardiac stents  -Heart score 4 - Has had nuclear med stress test in July 2015 without ischemia. He follows with Dr. Sharyn Lull outpt - Cycle cardiac enzymes. The first troponin was WNL. 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     [facility] History and Physical                                                                                     Eddie Graves, is a 81 y.o. male  MRN: [id number]   DOB - 18-Apr-1931  Admit Date - 05/19/2015  Outpatient Primary MD for the patient is REED, Elmarie Shiley, DO  Referring MD: Denton Lank / ER  With History of -  Past Medical History  Diagnosis Date  . DM (diabetes mellitus) type II controlled peripheral vascular disorder   . Essential hypertension, benign   . Blind   . Headache(784.0)   . Sebaceous cyst   . Chronic airway obstruction, not elsewhere classified   . Cough   . Spinal stenosis, lumbar region, with neurogenic claudication   . Coronary atherosclerosis of native coronary artery   . Vitamin D deficiency   . Rash and other nonspecific skin eruption   . Other and unspecified hyperlipidemia   . Anxiety state, unspecified   . Depressive disorder, not elsewhere classified   . Legal blindness, as defined in [place]   . Reflux esophagitis   . Unspecified constipation   . Pain in joint, site unspecified   . Memory loss   . Palpitations   . Type II diabetes mellitus with neurological manifestations, uncontrolled   . H/O hiatal hernia       Past  Surgical History  Procedure Laterality Date  . Coronary angioplasty with stent placement    . Hernia repair    . Eye surgery      in for   Chief Complaint  Patient presents with  . Chest Pain     HPI This is a 81 year old male patient with known CAD and report of stents placed around 2013 but no prior history of MI, diabetes, COPD, blindness, hypertension who presented to the hospital after developing chest discomfort at home. According to the patient after he returned from church this morning around 9 AM he was watching the news and noticed that he "felt bad". He noticed some mid chest pressure that then radiated down towards his epigastric and upper stomach region. He felt nauseous but was unable to vomit. Began shaking and felt very hot and sweaty. He reported the symptoms lasted about 30 minutes. He checked his  sugar which was 275 and subsequently took 30 units of his 70/30 insulin. He does not think he has any short acting insulin at home. When asking him if this was typical for his previous cardiac pain he said no. He did not have any shortness of breath with these symptoms. He also endorsed that last week he had some sharp colicky-type pains involving primarily the left lateral chest and rib cage region with similar but not as severe pains on the left side these waxed and waned for about 3-4 days and have subsequently disappeared. Patient reports that his son moved in with him recently and had been preparing food but over the past few weeks the son is not cooking as often as he used to and the patient reports eating out more fast food. Patient also informed me that several weeks ago he went was primary care physician and was informed he had gonorrhea and was given pills to take. He is primarily complaining now of some discomfort in the penis area.  When the patient first arrived to the ER he was normotensive with a BP of 139/99. He had been given nitroglycerin sublingual just prior to coming through the doors of the emergency department. By the time vital signs were repeated after the patient had been in the ER for several minutes his blood pressure had dropped to 78/49. It was later discovered that the patient had a nitroglycerin patch in place. This was removed by the EDP in the area cleansed of any residual drug. Patient was given a bolus of IV fluids with improvement in his blood pressure to greater than 90 systolic and most recent blood pressure is 121/70 with a pulse rate of 74. Because of the symptoms related to nitrates I asked the patient if he had been eating and drinking well but he states he probably hasn't been drinking as much fluid as he should be. His renal function was at baseline with a BUN of 15 and creatinine of 1.81, potassium borderline high at 5.0, albumen low at 3.4, ALT low at 15, troponin  negative reading of less than 0.03, lactic acid 0.99, white count 7900, hemoglobin 12.3 with a glucose of 196, EKG was sinus rhythm first degree AV block and PVCs unchanged from previous EKG in 2014. Chest x-ray was read as new bibasilar atelectasis but when compared to previous films no significant changes. One question about respiratory symptoms patient denies any symptoms.   Review of Systems   In addition to the HPI above,  No Fever-chills, myalgias or other constitutional symptoms No Headache, changes with Vision  or hearing, new weakness, tingling, numbness in any extremity, No problems swallowing food or Liquids, indigestion/reflux No Chest pain, Cough or Shortness of Breath, palpitations, orthopnea or DOE No Abdominal pain, N/V; no melena or hematochezia, no dark tarry stools, Bowel movements are regular, No dysuria, hematuria or flank pain No new skin rashes, lesions, masses or bruises, No new joints pains-aches No recent weight gain or loss No polyuria, polydypsia or polyphagia,  *A full 10 point Review of Systems was done, except as stated above, all other Review of Systems were negative.  Social History History  Substance Use Topics  . Smoking status: Light Tobacco Smoker -- 50 years    Types: Cigarettes    Last Attempt to Quit: 03/07/2013  . Smokeless tobacco: Never Used     Comment: Patient say he smokes 2 cigs. per day  . Alcohol Use: No    Resides at: Private residence  Lives with: Son  Ambulatory status: Without assistive devices although patient is legally blind   Family History Family History  Problem Relation Age of Onset  . Asthma Mother   . Diabetes Son   . Cancer Daughter   . Diabetes Son      Prior to Admission medications   Medication Sig Start Date End Date Taking? Authorizing Provider  albuterol (PROVENTIL HFA;VENTOLIN HFA) 108 (90 BASE) MCG/ACT inhaler Inhale 2 puffs into the lungs every 2 (two) hours as needed for wheezing or shortness of  breath. 04/29/15  Yes Tiffany L Reed, DO  amLODipine (NORVASC) 10 MG tablet Take 1 tablet (10 mg total) by mouth daily. For blood pressure 10/18/14  Yes Tiffany L Reed, DO  aspirin EC 81 MG tablet Take 1 tablet (81 mg total) by mouth daily. 10/18/14  Yes Tiffany L Reed, DO  glucose blood test strip Check glucose twice daily 11/29/14  Yes Tiffany L Reed, DO  Insulin Aspart Prot & Aspart (NOVOLOG MIX 70/30 FLEXPEN) (70-30) 100 UNIT/ML Pen Inject 30 Units into the skin 2 (two) times daily. Patient taking differently: Inject 40 Units into the skin daily with supper.  10/15/14  Yes Leroy Sea, MD  ipratropium-albuterol (DUONEB) 0.5-2.5 (3) MG/3ML SOLN Take 3 mLs by nebulization every 6 (six) hours as needed (wheezing). 04/29/15  Yes Tiffany L Reed, DO  levETIRAcetam (KEPPRA XR) 500 MG 24 hr tablet Take 2 tablets (1,000 mg total) by mouth daily. For seizures 03/21/15  Yes Tiffany L Reed, DO  linagliptin (TRADJENTA) 5 MG TABS tablet Take 1 tablet (5 mg total) by mouth daily. For diabetes 10/18/14  Yes Tiffany L Reed, DO  lisinopril (PRINIVIL,ZESTRIL) 10 MG tablet take 1 tablet by mouth once daily for blood pressure 01/14/15  Yes Tiffany L Reed, DO  metoprolol succinate (TOPROL-XL) 25 MG 24 hr tablet Take 1 tablet (25 mg total) by mouth daily. For blood pressure 10/18/14  Yes Tiffany L Reed, DO  NEXIUM 40 MG capsule TAKE 1 CAPULE BY MOUTH ONCE DAILY BEFORE BREAKFAST 10/23/14  Yes Tiffany L Reed, DO  oxyCODONE (ROXICODONE) 5 MG immediate release tablet Take one tablet by mouth every 6 hours as needed for pain 04/18/15  Yes Tiffany L Reed, DO  sertraline (ZOLOFT) 50 MG tablet Take 0.5 tablets (25 mg total) by mouth daily. 10/18/14  Yes Tiffany L Reed, DO  Cholecalciferol (VITAMIN D) 2000 UNITS CAPS Take 1 capsule by mouth daily. For vitamin D repletion    Historical Provider, MD  CRESTOR 20 MG tablet Take 20 mg by mouth daily. For cholesterol  08/13/14   Historical Provider, MD  GARLIC PO Take 75 mg by mouth  daily.     Historical Provider, MD  Multiple Vitamin (MULITIVITAMIN WITH MINERALS) TABS Take 1 tablet by mouth daily.    Historical Provider, MD  nitroGLYCERIN (NITRODUR - DOSED IN MG/24 HR) 0.4 mg/hr Place 1 patch onto the skin daily. For chest pain    Historical Provider, MD  Omega 3 1200 MG CAPS Take 2,400 mg by mouth daily. For cholesterol    Historical Provider, MD    Allergies  Allergen Reactions  . Flexeril [Cyclobenzaprine] Other (See Comments)    delirium    Physical Exam  Vitals  Blood pressure 121/70, pulse 41, temperature 98.2 F (36.8 C), temperature source Rectal, resp. rate 24, SpO2 92 %.   General:  In no acute distress, appears healthy and well nourished  Psych:  Normal affect, Denies Suicidal or Homicidal ideations, Awake Alert, Oriented X 3. Speech and thought patterns are clear and appropriate, no apparent short term memory deficits  Neuro:   No focal neurological deficits, CN II through XII intact except for previously documented legal blindness, Strength 5/5 all 4 extremities, Sensation intact all 4 extremities.  ENT:  Ears and Eyes appear Normal, Conjunctivae clear, PER. Moist oral mucosa without erythema or exudates.  Neck:  Supple, No lymphadenopathy appreciated  Respiratory:  Symmetrical chest wall movement, Good air movement bilaterally, CTAB. Room Air  Cardiac:  RRR, No Murmurs, no LE edema noted, no JVD, No carotid bruits, peripheral pulses palpable at 2+  Abdomen:  Positive bowel sounds, Soft, Non tender, Non distended,  No masses appreciated, no obvious hepatosplenomegaly  Genitourinary: Gross examination of external genitalia reveal no erythema, drainage or lesions  Skin:  No Cyanosis, Normal Skin Turgor, No Skin Rash or Bruise.  Extremities: Symmetrical without obvious trauma or injury,  no effusions.  Data Review  CBC  Recent Labs Lab 05/19/15 1201  WBC 7.9  HGB 12.3*  HCT 37.6*  PLT 201  MCV 88.7  MCH 29.0  MCHC 32.7  RDW 13.5     Chemistries   Recent Labs Lab 05/19/15 1201  NA 136  K 5.0  CL 102  CO2 27  GLUCOSE 196*  BUN 15  CREATININE 1.81*  CALCIUM 9.7  AST 30  ALT 15*  ALKPHOS 81  BILITOT 0.7    CrCl cannot be calculated (Unknown ideal weight.).  No results for input(s): TSH, T4TOTAL, T3FREE, THYROIDAB in the last 72 hours.  Invalid input(s): FREET3  Coagulation profile No results for input(s): INR, PROTIME in the last 168 hours.  No results for input(s): DDIMER in the last 72 hours.  Cardiac Enzymes  Recent Labs Lab 05/19/15 1201  TROPONINI <0.03    Invalid input(s): POCBNP  Urinalysis    Component Value Date/Time   COLORURINE YELLOW 10/12/2014 0048   APPEARANCEUR CLEAR 10/12/2014 0048   LABSPEC 1.030 10/12/2014 0048   PHURINE 5.0 10/12/2014 0048   GLUCOSEU >1000* 10/12/2014 0048   HGBUR NEGATIVE 10/12/2014 0048   BILIRUBINUR SMALL* 10/12/2014 0048   KETONESUR NEGATIVE 10/12/2014 0048   PROTEINUR NEGATIVE 10/12/2014 0048   UROBILINOGEN 0.2 10/12/2014 0048   NITRITE NEGATIVE 10/12/2014 0048   LEUKOCYTESUR NEGATIVE 10/12/2014 0048    Imaging results:   Dg Chest 2 View  05/19/2015   CLINICAL DATA:  Chest pressure  EXAM: CHEST - 2 VIEW  COMPARISON:  08/06/2013  FINDINGS: Cardiac shadow is within normal limits. The lungs are well aerated bilaterally. Mild bibasilar atelectatic changes  are seen. No focal confluent infiltrate is noted.  IMPRESSION: New bibasilar atelectasis.   Electronically Signed   By: Alcide Clever M.D.   On: 05/19/2015 13:05     EKG: (Independently reviewed) sinus rhythm with first degree AV block occasional focal PVCs, nonspecific ST segment changes in the septal lateral leads unchanged from previous EKG, left axis deviation   Assessment & Plan  Principal Problem:   Chest pain/known CAD and cardiac stents -Admit to telemetry/observation -Heart score 4 -Nuclear med stress test July 2015 without ischemia -Patient's primary cardiologist is Dr.  Tacy Dura add him to treatment team but no indications at this juncture for need to formally consult cardiology -Currently chest pain-free and nitroglycerin paste off secondary to recent hypotension from excess nitrates -We'll hold beta blocker until proven blood pressure has recovered -Continue aspirin, statin and omega-3 fatty acids -Last echo 2014 so check again this admission  Active Problems:   Hypotension -Secondary to excess statin and possible low-grade volume depletion -Blood pressure has improved with holding antihypertensives and removing topical nitrate as well as administration of one fluid bolus -Continue to monitor and slowly add back in patient's Toprol, lisinopril and Norvasc   COPD -Stable without wheezing or evidence of exacerbation    Essential hypertension, benign -Anti-hypertensive medications on hold secondary to recent hypotension (see above)    Diabetic peripheral neuropathy associated with type 2 diabetes mellitus -CBG mildly elevated at presentation at 196 -Check CBG AC/HS and provide SSI -Continue Tradjenta and 70/30 insulin but do not resume 70/30 insulin until tomorrow since patient reportedly gave himself a dose of 30 units prior to coming to the ER and he only takes this once a day in the evening -Check hemoglobin A1c- note was markedly elevated at 11.3 in April 2016; suspect ongoing dietary indiscretion as endorsed by patient with increase of intake of fast food contributing    CKD (chronic kidney disease), stage III -Renal function at baseline    Blind -Appropriate nursing interventions and adjustments to room to ensure patient's safety    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 ANP on 05/19/2015 at 3:04 PM  Between 7am to 7pm - Pager - [phone]  After 7pm go to [email or web address] - password [email or web address]  And look for the night coverage person covering me after hours  [facility]

## 2015-05-19 NOTE — ED Notes (Signed)
Hospitalist at bedside 

## 2015-05-19 NOTE — ED Notes (Signed)
Eddie Graves [phone]  Eddie Graves  [phone]

## 2015-05-19 NOTE — ED Provider Notes (Addendum)
CSN: [id number]     Arrival date & time 05/19/15  1135 History   First MD Initiated Contact with Patient 05/19/15 1139     Chief Complaint  Patient presents with  . Chest Pain     (Consider location/radiation/quality/duration/timing/severity/associated sxs/prior Treatment) Patient is a 81 y.o. male presenting with chest pain. The history is provided by the patient.  Chest Pain Associated symptoms: nausea   Associated symptoms: no abdominal pain, no back pain, no fever, no headache, no palpitations, no shortness of breath and not vomiting   Patient w hx iddm, cad, presents w mid chest pain onset at rest today. Was located lower mid to left chest/costal margin area. Lasted for several minutes. Nausea. No vomiting. No diaphoresis. Had asa and a ntg w relief of symptoms. Pt states similar pain in past, and states is unsure if same as prior cardiac chest pain. No pleuritic pain. Hx rad, uses albuterol mdi prn/daily. Occasional non prod cough, no recent change. No fever or chills. +smoker. Denies other recent cp or worsening of cp, other than episode today.     Past Medical History  Diagnosis Date  . DM (diabetes mellitus) type II controlled peripheral vascular disorder   . Essential hypertension, benign   . Blind   . Headache(784.0)   . Sebaceous cyst   . Chronic airway obstruction, not elsewhere classified   . Cough   . Spinal stenosis, lumbar region, with neurogenic claudication   . Coronary atherosclerosis of native coronary artery   . Vitamin D deficiency   . Rash and other nonspecific skin eruption   . Other and unspecified hyperlipidemia   . Anxiety state, unspecified   . Depressive disorder, not elsewhere classified   . Legal blindness, as defined in [place]   . Reflux esophagitis   . Unspecified constipation   . Pain in joint, site unspecified   . Memory loss   . Palpitations   . Type II diabetes mellitus with neurological manifestations, uncontrolled   . H/O hiatal hernia     Past Surgical History  Procedure Laterality Date  . Coronary angioplasty with stent placement    . Hernia repair    . Eye surgery     Family History  Problem Relation Age of Onset  . Asthma Mother   . Diabetes Son   . Cancer Daughter   . Diabetes Son    History  Substance Use Topics  . Smoking status: Light Tobacco Smoker -- 50 years    Types: Cigarettes    Last Attempt to Quit: 03/07/2013  . Smokeless tobacco: Never Used     Comment: Patient say he smokes 2 cigs. per day  . Alcohol Use: No    Review of Systems  Constitutional: Negative for fever and chills.  HENT: Negative for sore throat.   Eyes: Negative for redness.  Respiratory: Negative for shortness of breath.   Cardiovascular: Positive for chest pain. Negative for palpitations and leg swelling.  Gastrointestinal: Positive for nausea. Negative for vomiting and abdominal pain.  Genitourinary: Negative for flank pain.  Musculoskeletal: Negative for back pain and neck pain.  Skin: Negative for rash.  Neurological: Negative for headaches.  Hematological: Does not bruise/bleed easily.  Psychiatric/Behavioral: Negative for confusion.      Allergies  Flexeril  Home Medications   Prior to Admission medications   Medication Sig Start Date End Date Taking? Authorizing Provider  albuterol (PROVENTIL HFA;VENTOLIN HFA) 108 (90 BASE) MCG/ACT inhaler Inhale 2 puffs into the lungs every 2 (  two) hours as needed for wheezing or shortness of breath. 04/29/15   Tiffany L Reed, DO  amLODipine (NORVASC) 10 MG tablet Take 1 tablet (10 mg total) by mouth daily. For blood pressure 10/18/14   Tiffany L Reed, DO  aspirin EC 81 MG tablet Take 1 tablet (81 mg total) by mouth daily. 10/18/14   Tiffany L Reed, DO  Cholecalciferol (VITAMIN D) 2000 UNITS CAPS Take 1 capsule by mouth daily. For vitamin D repletion    Historical Provider, MD  CRESTOR 20 MG tablet Take 20 mg by mouth daily. For cholesterol 08/13/14   Historical Provider, MD   GARLIC PO Take 75 mg by mouth daily.     Historical Provider, MD  glucose blood test strip Check glucose twice daily 11/29/14   Tiffany L Reed, DO  glucose monitoring kit (FREESTYLE) monitoring kit 1 each by Does not apply route 4 (four) times daily - after meals and at bedtime. 1 month Diabetic Testing Supplies for QAC-QHS accuchecks.Any brand OK 10/15/14   Thurnell Lose, MD  Insulin Aspart Prot & Aspart (NOVOLOG MIX 70/30 FLEXPEN) (70-30) 100 UNIT/ML Pen Inject 30 Units into the skin 2 (two) times daily. Patient taking differently: Inject 40 Units into the skin daily with supper.  10/15/14   Thurnell Lose, MD  Insulin Syringe-Needle U-100 25G X 1" 1 ML MISC Any brand, for 4 times a day insulin SQ, 1 month supply. 10/15/14   Thurnell Lose, MD  ipratropium-albuterol (DUONEB) 0.5-2.5 (3) MG/3ML SOLN Take 3 mLs by nebulization every 6 (six) hours as needed (wheezing). 04/29/15   Tiffany L Reed, DO  levETIRAcetam (KEPPRA XR) 500 MG 24 hr tablet Take 2 tablets (1,000 mg total) by mouth daily. For seizures 03/21/15   Tiffany L Reed, DO  linagliptin (TRADJENTA) 5 MG TABS tablet Take 1 tablet (5 mg total) by mouth daily. For diabetes 10/18/14   Tiffany L Reed, DO  lisinopril (PRINIVIL,ZESTRIL) 10 MG tablet take 1 tablet by mouth once daily for blood pressure 01/14/15   Tiffany L Reed, DO  metoprolol succinate (TOPROL-XL) 25 MG 24 hr tablet Take 1 tablet (25 mg total) by mouth daily. For blood pressure 10/18/14   Tiffany L Reed, DO  Multiple Vitamin (MULITIVITAMIN WITH MINERALS) TABS Take 1 tablet by mouth daily.    Historical Provider, MD  NEXIUM 40 MG capsule TAKE 1 CAPULE BY MOUTH ONCE DAILY BEFORE BREAKFAST 10/23/14   Tiffany L Reed, DO  nitroGLYCERIN (NITRODUR - DOSED IN MG/24 HR) 0.4 mg/hr Place 1 patch onto the skin daily. For chest pain    Historical Provider, MD  Omega 3 1200 MG CAPS Take 2,400 mg by mouth daily. For cholesterol    Historical Provider, MD  oxyCODONE (ROXICODONE) 5 MG immediate  release tablet Take one tablet by mouth every 6 hours as needed for pain 04/18/15   Tiffany L Reed, DO  sertraline (ZOLOFT) 50 MG tablet Take 0.5 tablets (25 mg total) by mouth daily. 10/18/14   Tiffany L Reed, DO  zoster vaccine live, PF, (ZOSTAVAX) 37858 UNT/0.65ML injection Inject 19,400 Units into the skin once. Patient not taking: Reported on 04/29/2015 03/21/15   Tiffany L Reed, DO   BP 139/99 mmHg  Pulse 71  Temp(Src) 97.7 F (36.5 C) (Oral)  Resp 17  SpO2 95% Physical Exam  Constitutional: He is oriented to person, place, and time. He appears well-developed and well-nourished. No distress.  HENT:  Mouth/Throat: Oropharynx is clear and moist.  Eyes: Conjunctivae are  normal. No scleral icterus.  Neck: Neck supple. No JVD present. No tracheal deviation present.  Cardiovascular: Normal rate, normal heart sounds and intact distal pulses.  Exam reveals no gallop and no friction rub.   No murmur heard. Pulmonary/Chest: Effort normal and breath sounds normal. No accessory muscle usage. No respiratory distress. He exhibits no tenderness.  Abdominal: Soft. Bowel sounds are normal. He exhibits no distension. There is no tenderness.  Musculoskeletal: Normal range of motion. He exhibits no edema or tenderness.  Neurological: He is alert and oriented to person, place, and time.  Skin: Skin is warm and dry. He is not diaphoretic.  Psychiatric: He has a normal mood and affect.  Nursing note and vitals reviewed.   ED Course  Procedures (including critical care time) Labs Review  Results for orders placed or performed during the hospital encounter of 05/19/15  CBC  Result Value Ref Range   WBC 7.9 4.0 - 10.5 K/uL   RBC 4.24 4.22 - 5.81 MIL/uL   Hemoglobin 12.3 (L) 13.0 - 17.0 g/dL   HCT 37.6 (L) 39.0 - 52.0 %   MCV 88.7 78.0 - 100.0 fL   MCH 29.0 26.0 - 34.0 pg   MCHC 32.7 30.0 - 36.0 g/dL   RDW 13.5 11.5 - 15.5 %   Platelets 201 150 - 400 K/uL  Comprehensive metabolic panel  Result  Value Ref Range   Sodium 136 135 - 145 mmol/L   Potassium 5.0 3.5 - 5.1 mmol/L   Chloride 102 101 - 111 mmol/L   CO2 27 22 - 32 mmol/L   Glucose, Bld 196 (H) 65 - 99 mg/dL   BUN 15 6 - 20 mg/dL   Creatinine, Ser 1.81 (H) 0.61 - 1.24 mg/dL   Calcium 9.7 8.9 - 10.3 mg/dL   Total Protein 6.8 6.5 - 8.1 g/dL   Albumin 3.4 (L) 3.5 - 5.0 g/dL   AST 30 15 - 41 U/L   ALT 15 (L) 17 - 63 U/L   Alkaline Phosphatase 81 38 - 126 U/L   Total Bilirubin 0.7 0.3 - 1.2 mg/dL   GFR calc non Af Amer 33 (L) >60 mL/min   GFR calc Af Amer 39 (L) >60 mL/min   Anion gap 7 5 - 15  Troponin I  Result Value Ref Range   Troponin I <0.03 <0.031 ng/mL   Dg Chest 2 View  05/19/2015   CLINICAL DATA:  Chest pressure  EXAM: CHEST - 2 VIEW  COMPARISON:  08/06/2013  FINDINGS: Cardiac shadow is within normal limits. The lungs are well aerated bilaterally. Mild bibasilar atelectatic changes are seen. No focal confluent infiltrate is noted.  IMPRESSION: New bibasilar atelectasis.   Electronically Signed   By: Inez Catalina M.D.   On: 05/19/2015 13:05        EKG Interpretation   Date/Time:  Sunday May 19 2015 11:42:03 EDT Ventricular Rate:  79 PR Interval:  267 QRS Duration: 120 QT Interval:  434 QTC Calculation: 497 R Axis:   -26 Text Interpretation:  Sinus rhythm with 1st degree A-V block Left axis  deviation Premature ventricular complexes Nonspecific intraventricular  conduction delay Nonspecific T wave abnormality No significant change  since last tracing Confirmed by Ashok Cordia  MD, Lennette Bihari ([id number]) on 05/19/2015  11:49:31 AM      MDM   Iv ns bolus. Continuous pulse ox and monitor. Labs. Cxr. Ecg.  Reviewed nursing notes and prior charts for additional history.   Recheck no cp.  Given hx  cad, acute chest pain, worse than prior, relieved w ntg, will admit to med service for r/o.   On recheck, pt on repeat vitals hypotensive.  Pt denies faintness or lightheadedness. No syncope. Pt denies fever or chills.  Denies cough or uri c/o. Denies gu c/o. abd soft nt. No new c/o, no pain.    500 cc ns bolus. Will check rectal temp, added lactate to labs.  Pt did get sl ntg just pta, and on recheck is wearing nitropatch - nitropatch removed, and skin cleaned. Delta trop pending.   Hospitalists paged for admit (pcp is psc) - will admit.       Lajean Saver, MD 05/19/15 [phone]

## 2015-05-20 ENCOUNTER — Inpatient Hospital Stay (HOSPITAL_BASED_OUTPATIENT_CLINIC_OR_DEPARTMENT_OTHER): Payer: Medicare Other

## 2015-05-20 ENCOUNTER — Ambulatory Visit: Payer: Medicare Other | Admitting: Pharmacotherapy

## 2015-05-20 DIAGNOSIS — N179 Acute kidney failure, unspecified: Secondary | ICD-10-CM | POA: Diagnosis not present

## 2015-05-20 DIAGNOSIS — R079 Chest pain, unspecified: Secondary | ICD-10-CM | POA: Diagnosis not present

## 2015-05-20 DIAGNOSIS — H540 Blindness, both eyes: Secondary | ICD-10-CM | POA: Diagnosis not present

## 2015-05-20 DIAGNOSIS — N189 Chronic kidney disease, unspecified: Secondary | ICD-10-CM | POA: Insufficient documentation

## 2015-05-20 DIAGNOSIS — G629 Polyneuropathy, unspecified: Secondary | ICD-10-CM

## 2015-05-20 DIAGNOSIS — R0789 Other chest pain: Secondary | ICD-10-CM

## 2015-05-20 DIAGNOSIS — N183 Chronic kidney disease, stage 3 (moderate): Secondary | ICD-10-CM | POA: Diagnosis not present

## 2015-05-20 DIAGNOSIS — E1142 Type 2 diabetes mellitus with diabetic polyneuropathy: Secondary | ICD-10-CM

## 2015-05-20 DIAGNOSIS — I10 Essential (primary) hypertension: Secondary | ICD-10-CM

## 2015-05-20 LAB — BASIC METABOLIC PANEL
ANION GAP: 6 (ref 5–15)
BUN: 12 mg/dL (ref 6–20)
CALCIUM: 9.3 mg/dL (ref 8.9–10.3)
CO2: 27 mmol/L (ref 22–32)
CREATININE: 1.3 mg/dL — AB (ref 0.61–1.24)
Chloride: 107 mmol/L (ref 101–111)
GFR calc Af Amer: 58 mL/min — ABNORMAL LOW (ref >60)
GFR calc non Af Amer: 50 mL/min — ABNORMAL LOW (ref >60)
GLUCOSE: 121 mg/dL — AB (ref 65–99)
Potassium: 4.5 mmol/L (ref 3.5–5.1)
Sodium: 140 mmol/L (ref 135–145)

## 2015-05-20 LAB — HEMOGLOBIN A1C
Hgb A1c MFr Bld: 8.4 % — ABNORMAL HIGH (ref 4.8–5.6)
Mean Plasma Glucose: 194 mg/dL

## 2015-05-20 LAB — GLUCOSE, CAPILLARY
GLUCOSE-CAPILLARY: 122 mg/dL — AB (ref 65–99)
Glucose-Capillary: 143 mg/dL — ABNORMAL HIGH (ref 65–99)

## 2015-05-20 LAB — URINE CULTURE
COLONY COUNT: NO GROWTH
CULTURE: NO GROWTH

## 2015-05-20 LAB — TROPONIN I
Troponin I: <0.03 ng/mL (ref <0.031)
Troponin I: <0.03 ng/mL (ref <0.031)

## 2015-05-20 NOTE — Discharge Summary (Signed)
Physician Discharge Summary  Eddie Graves RFX:[id number] DOB: 1931/06/25 DOA: 05/19/2015  PCP: Bufford Spikes, DO  Admit date: 05/19/2015 Discharge date: 05/20/2015  Time spent: 30 minutes  Recommendations for Outpatient Follow-up:  1. Follow discharge instructions as given. 2. Follow-up with Dr. Sharyn Lull this week, keep your scheduled appointment. 3. A social worker is assessing the possibility of home health assistance for CBG's and checking living situation/general well-being. 4. 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   History of gonorrhea   Hypotension   CKD (chronic kidney disease), stage III   AKI (acute kidney injury)   Chest Pain with history of CAD - No chest pain today - Troponin negative x 3 sets - 2D echo did not show regional wall abnormalities, having EF of 55-60% - Patient will follow up with his cardiologist in 1 week  Stage III CKD with AKI - Creatinine has decreased 1.81 >>1.30 - Kidney function stable - Discontinue IV fluids  Hypotension - Hypotension due to nitroglycerin dosing has resolved, BP to back to 138/66 this morning - Denies dizziness, light-headedness, trouble standing/walking  HTN - BP has been stable since presenting with exception of additional dose of nitroglycerin which dropped it to 78/49, now resolved -Will resume his home blood pressure med son discharge  DM2, not well controlled - In discussing living situation patient he states he has trouble monitoring blood glucose because of blindness - Son temporarily helping, ordering dinners out, [name] needs a  more permanent solution - Discussed home health options with SW, will see what assistance can be provided with CBG and meals -Home Health services set up for RN for assistance with diabetes management.   COPD - Breathing stable, denies any symptoms other than one cough this morning - Quit smoking 3 weeks ago - Continue Atrovent, Duoneb  Hyperlipidemia - Continue amlodipine and rosuvastatin  Seizure disorder - Continue levetiracetam     Discharge Condition: Stable.  Diet recommendation: Carb-modified diet for DM  Filed Weights   05/19/15 1522  Weight: 93.305 kg (205 lb 11.2 oz)    History of present illness:  [name] is an 81 y.o. male with PMH significant for CAD, COPD, HTN, DM2, HLD and CKD who presented to the ED on 05/19/2015 with sharp, intermittent substernal chest pain that radiated to the epigastric area. Today he reports his pain has been gone since he left home yesterday, he denies headache, dizziness, abdominal pain, nausea, new weakness tingling or numbness. Coughed once this AM with sputum production, unable to determine if purulent due to patient's blindness. States he quit smoking 3 weeks ago.  Hospital Course:  [name] was admitted yesterday for evaluation of chest pain. The hypotension that began in the ED resolved with IV fluids. There was no recurrence of the pain and a 2D echo was ordered to survey his cardiac function. He has improved, has no current symptoms, and will follow-up this week with his cardiologist Dr. Sharyn Lull.  Procedures:  2D Echocardiogram Impression Left ventricle: The cavity size was normal. There was moderate focal basal hypertrophy. Systolic function was normal. The estimated ejection fraction was in the  range of 55% to 60%. Wall motion was normal; there were no regional wall motion abnormalities. Features are consistent with a pseudonormal left ventricular filling pattern, with concomitant abnormal relaxation and  increased filling pressure (grade 2 diastolic dysfunction).   Discharge Exam: Filed Vitals:   05/20/15 0436  BP: 138/66  Pulse: 69  Temp: 98.4 F (36.9 C)  Resp: 16    General: In no acute distress, appears healthy and well nourished, eating breakfast. HENT: Normocephalic, moist oral mucosa without erythema or exudates. Neck: Supple, no lymphadenopathy  Cardiac: RRR, no Murmurs, no LE edema noted, no JVD, peripheral pulses palpable at 2+ Respiratory: Diffuse rhonchi Abdomen: Positive bowel sounds, soft, non tender, non-distended, no masses Skin:No rashes, lesions noted Extremities: Symmetric without obvious trauma or injury, intact ROM  Discharge Instructions   Discharge Instructions    Call MD for:  difficulty breathing, headache or visual disturbances    Complete by:  As directed      Call MD for:  extreme fatigue    Complete by:  As directed      Call MD for:  hives    Complete by:  As directed      Call MD for:  persistant dizziness or light-headedness    Complete by:  As directed      Call MD for:  persistant nausea and vomiting    Complete by:  As directed      Call MD for:  redness, tenderness, or signs of infection (pain, swelling, redness, odor or green/yellow discharge around incision site)    Complete by:  As directed      Call MD for:  severe uncontrolled pain    Complete by:  As directed      Call MD for:  temperature >100.4    Complete by:  As directed      Call MD for:    Complete by:  As directed      Diet - low sodium heart healthy    Complete by:  As directed      Increase activity slowly    Complete by:  As directed           Current Discharge Medication List    CONTINUE these medications which have NOT CHANGED   Details  albuterol (PROVENTIL HFA;VENTOLIN HFA) 108 (90 BASE) MCG/ACT inhaler Inhale 2 puffs into the lungs every 2 (two) hours as needed for wheezing or shortness of breath. Qty: 18 g, Refills: 3   Associated Diagnoses: COPD  bronchitis    amLODipine (NORVASC) 10 MG tablet Take 1 tablet (10 mg total) by mouth daily. For blood pressure Qty: 30 tablet, Refills: 3    aspirin EC 81 MG tablet Take 1 tablet (81 mg total) by mouth daily. Qty: 30 tablet, Refills: 3   Associated Diagnoses: Coronary artery disease involving native coronary artery with other forms of angina pectoris    glucose blood test strip Check glucose twice daily Qty: 100 each, Refills: 12   Associated Diagnoses: Type II diabetes mellitus with neurological manifestations, uncontrolled    Insulin Aspart Prot & Aspart (NOVOLOG MIX 70/30 FLEXPEN) (70-30) 100 UNIT/ML Pen Inject 30 Units into the skin 2 (two) times daily. Qty: 15 mL, Refills: 11    ipratropium-albuterol (DUONEB) 0.5-2.5 (3) MG/3ML SOLN Take 3 mLs by nebulization every 6 (six) hours as needed (wheezing). Qty: 360 mL, Refills: 3   Associated Diagnoses: COPD bronchitis    levETIRAcetam (KEPPRA XR) 500 MG 24 hr tablet Take 2 tablets (  1,000 mg total) by mouth daily. For seizures Qty: 180 tablet, Refills: 1   Associated Diagnoses: Focal seizure    linagliptin (TRADJENTA) 5 MG TABS tablet Take 1 tablet (5 mg total) by mouth daily. For diabetes Qty: 30 tablet, Refills: 3   Associated Diagnoses: Type II diabetes mellitus with neurological manifestations, uncontrolled; Diabetic peripheral neuropathy associated with type 2 diabetes mellitus    lisinopril (PRINIVIL,ZESTRIL) 10 MG tablet take 1 tablet by mouth once daily for blood pressure Qty: 30 tablet, Refills: 5    metoprolol succinate (TOPROL-XL) 25 MG 24 hr tablet Take 1 tablet (25 mg total) by mouth daily. For blood pressure Qty: 30 tablet, Refills: 3    NEXIUM 40 MG capsule TAKE 1 CAPULE BY MOUTH ONCE DAILY BEFORE BREAKFAST Qty: 30 capsule, Refills: 5    oxyCODONE (ROXICODONE) 5 MG immediate release tablet Take one tablet by mouth every 6 hours as needed for pain Qty: 120 tablet, Refills: 0    sertraline (ZOLOFT) 50 MG tablet  Take 0.5 tablets (25 mg total) by mouth daily. Qty: 30 tablet, Refills: 3    Cholecalciferol (VITAMIN D) 2000 UNITS CAPS Take 1 capsule by mouth daily. For vitamin D repletion    CRESTOR 20 MG tablet Take 20 mg by mouth daily. For cholesterol    GARLIC PO Take 75 mg by mouth daily.     Multiple Vitamin (MULITIVITAMIN WITH MINERALS) TABS Take 1 tablet by mouth daily.    nitroGLYCERIN (NITRODUR - DOSED IN MG/24 HR) 0.4 mg/hr Place 1 patch onto the skin daily. For chest pain    Omega 3 1200 MG CAPS Take 2,400 mg by mouth daily. For cholesterol       Allergies  Allergen Reactions  . Flexeril [Cyclobenzaprine] Other (See Comments)    delirium   Follow-up Information    Follow up with REED, TIFFANY, DO In 1 week.   Specialty:  Geriatric Medicine   Contact information:   [address] [place] [address] [phone]       Follow up with Rinaldo Cloud, MD In 1 week.   Specialty:  Cardiology   Contact information:   [address] [place] [address] [phone]       The results of significant diagnostics from this hospitalization (including imaging, microbiology, ancillary and laboratory) are listed below for reference.    Significant Diagnostic Studies: Dg Chest 2 View  05/19/2015   CLINICAL DATA:  Chest pressure  EXAM: CHEST - 2 VIEW  COMPARISON:  08/06/2013  FINDINGS: Cardiac shadow is within normal limits. The lungs are well aerated bilaterally. Mild bibasilar atelectatic changes are seen. No focal confluent infiltrate is noted.  IMPRESSION: New bibasilar atelectasis.   Electronically Signed   By: Alcide Clever M.D.   On: 05/19/2015 13:05    Microbiology: No results found for this or any previous visit (from the past 240 hour(s)).   Labs: Basic Metabolic Panel:  Recent Labs Lab 05/19/15 1201 05/20/15 0534  NA 136 140  K 5.0 4.5  CL 102 107  CO2 27 27  GLUCOSE 196* 121*  BUN 15 12  CREATININE 1.81* 1.30*  CALCIUM 9.7 9.3   Liver  Function Tests:  Recent Labs Lab 05/19/15 1201  AST 30  ALT 15*  ALKPHOS 81  BILITOT 0.7  PROT 6.8  ALBUMIN 3.4*   No results for input(s): LIPASE, AMYLASE in the last 168 hours. No results for input(s): AMMONIA in the last 168 hours. CBC:  Recent  Labs Lab 05/19/15 1201  WBC 7.9  HGB 12.3*  HCT 37.6*  MCV 88.7  PLT 201   Cardiac Enzymes:  Recent Labs Lab 05/19/15 1201 05/19/15 1846 05/19/15 2339 05/20/15 0534  TROPONINI <0.03 <0.03 <0.03 <0.03   BNP: BNP (last 3 results) No results for input(s): BNP in the last 8760 hours.  ProBNP (last 3 results) No results for input(s): PROBNP in the last 8760 hours.  CBG:  Recent Labs Lab 05/19/15 1613 05/19/15 2059 05/20/15 0615 05/20/15 1131  GLUCAP 105* 154* 122* 143*    Shana Hornbeck, PA-S  Signed:  Jeralyn Bennett  [facility] 05/20/2015, 12:02 PM   Addendum  I personally evaluated patient on 05/19/2016 discussed plan with PA student and with SW/CM. I agree with the above findings. He presented with complaints of CP resolving by the time he got to the ER. Serial enzymes remained negative x 3 set. Echo did not show wall motion abnormalities. He did not have further episodes of chest pain. On exam he had a few expiratory wheezes with rhonchi, otherwise no edema or evidence of CHF. SW and CM was were consulted to provide support services since he lives alone and is blind. He has meals on wheels and a son who comes by in the evenings.

## 2015-05-20 NOTE — Progress Notes (Signed)
UR Completed. Sina Sumpter, RN, BSN.  [phone] 

## 2015-05-20 NOTE — Care Management Note (Addendum)
Case Management Note  Patient Details  Name: Eddie Graves MRN: [id number] Date of Birth: 12-27-1930  Subjective/Objective:  Pt admitted with chest pain                Action/Plan:  Pt has discharge orders. Pt is blind.  MD requested [facility] for disease management and for CM to check with SW for possible programs in the community to help assist pt with daily blood glucose checks and other daily needs.   Expected Discharge Date:  05/21/15               Expected Discharge Plan:  Home w Home Health Services  In-House Referral:     Discharge planning Services  CM Consult, SW Consult  Post Acute Care Choice:    Choice offered to:     DME Arranged:    DME Agency:     HH Arranged:  RN, Disease Management, Social Work [employer] Agency:  [facility]  Status of Service:  Complete, will sign off Medicare Important Message Given:  Yes Date Medicare IM Given:  05/20/15 Medicare IM give by:  Eddie Graves Date Additional Medicare IM Given:    Additional Medicare Important Message give by:     If discussed at Long Length of Stay Meetings, dates discussed:    Additional Comments: 05/20/15 Eddie Blend, RN, BSN [phone] CM contacted in house SW in response to pts concerns and was informed that the [facility] SW would be the best resource for community programs to assist pt.  CM contacted Eddie Mosses RN with [facility] and requested possible programs that could assist pt with his daily glucose monitoring needs.  Eddie Graves stated; from current assessment pt does not qualify for assistance however she will contact pt via phone 05/21/15 and reassess to determine which program if any could further assist pt with any outstanding need.  CM communicated this to pt and told him to expect a phone call from the [facility] nurse tomorrow.  CM was informed by pt that he didn't need any DME and that he was mostly self sufficient in the home. CM offered choice, [facility], [facility] for disease management and SW referral  was accepted by [facility].  NO other CM needs    Eddie Parr, RN 05/20/2015, 11:53 AM

## 2015-05-20 NOTE — Progress Notes (Signed)
  Echocardiogram 2D Echocardiogram has been performed.  Eddie Graves 05/20/2015, 12:29 PM

## 2015-05-21 ENCOUNTER — Other Ambulatory Visit: Payer: Self-pay | Admitting: *Deleted

## 2015-05-21 DIAGNOSIS — E1165 Type 2 diabetes mellitus with hyperglycemia: Secondary | ICD-10-CM | POA: Diagnosis not present

## 2015-05-21 DIAGNOSIS — I10 Essential (primary) hypertension: Secondary | ICD-10-CM | POA: Diagnosis not present

## 2015-05-21 DIAGNOSIS — H540 Blindness, both eyes: Secondary | ICD-10-CM | POA: Diagnosis not present

## 2015-05-21 DIAGNOSIS — Z794 Long term (current) use of insulin: Secondary | ICD-10-CM | POA: Diagnosis not present

## 2015-05-21 DIAGNOSIS — E1149 Type 2 diabetes mellitus with other diabetic neurological complication: Secondary | ICD-10-CM

## 2015-05-21 DIAGNOSIS — IMO0002 Reserved for concepts with insufficient information to code with codable children: Secondary | ICD-10-CM

## 2015-05-21 DIAGNOSIS — E1140 Type 2 diabetes mellitus with diabetic neuropathy, unspecified: Secondary | ICD-10-CM | POA: Diagnosis not present

## 2015-05-21 DIAGNOSIS — I2510 Atherosclerotic heart disease of native coronary artery without angina pectoris: Secondary | ICD-10-CM | POA: Diagnosis not present

## 2015-05-21 DIAGNOSIS — J449 Chronic obstructive pulmonary disease, unspecified: Secondary | ICD-10-CM | POA: Diagnosis not present

## 2015-05-21 DIAGNOSIS — M4806 Spinal stenosis, lumbar region: Secondary | ICD-10-CM | POA: Diagnosis not present

## 2015-05-21 MED ORDER — GLUCOSE BLOOD VI STRP: ORAL_STRIP | Status: DC

## 2015-05-21 NOTE — Telephone Encounter (Signed)
Patient called and requested test strips to be faxed to [facility]. Faxed and patient notified.

## 2015-05-22 DIAGNOSIS — I2510 Atherosclerotic heart disease of native coronary artery without angina pectoris: Secondary | ICD-10-CM | POA: Diagnosis not present

## 2015-05-22 DIAGNOSIS — E1140 Type 2 diabetes mellitus with diabetic neuropathy, unspecified: Secondary | ICD-10-CM | POA: Diagnosis not present

## 2015-05-22 DIAGNOSIS — M4806 Spinal stenosis, lumbar region: Secondary | ICD-10-CM | POA: Diagnosis not present

## 2015-05-22 DIAGNOSIS — E1165 Type 2 diabetes mellitus with hyperglycemia: Secondary | ICD-10-CM | POA: Diagnosis not present

## 2015-05-22 DIAGNOSIS — I10 Essential (primary) hypertension: Secondary | ICD-10-CM | POA: Diagnosis not present

## 2015-05-22 DIAGNOSIS — J449 Chronic obstructive pulmonary disease, unspecified: Secondary | ICD-10-CM | POA: Diagnosis not present

## 2015-05-22 DIAGNOSIS — H540 Blindness, both eyes: Secondary | ICD-10-CM | POA: Diagnosis not present

## 2015-05-22 DIAGNOSIS — Z794 Long term (current) use of insulin: Secondary | ICD-10-CM | POA: Diagnosis not present

## 2015-05-23 DIAGNOSIS — I10 Essential (primary) hypertension: Secondary | ICD-10-CM | POA: Diagnosis not present

## 2015-05-23 DIAGNOSIS — E1140 Type 2 diabetes mellitus with diabetic neuropathy, unspecified: Secondary | ICD-10-CM | POA: Diagnosis not present

## 2015-05-23 DIAGNOSIS — J449 Chronic obstructive pulmonary disease, unspecified: Secondary | ICD-10-CM | POA: Diagnosis not present

## 2015-05-23 DIAGNOSIS — E1165 Type 2 diabetes mellitus with hyperglycemia: Secondary | ICD-10-CM | POA: Diagnosis not present

## 2015-05-23 DIAGNOSIS — I2510 Atherosclerotic heart disease of native coronary artery without angina pectoris: Secondary | ICD-10-CM | POA: Diagnosis not present

## 2015-05-23 DIAGNOSIS — M4806 Spinal stenosis, lumbar region: Secondary | ICD-10-CM | POA: Diagnosis not present

## 2015-05-23 DIAGNOSIS — Z794 Long term (current) use of insulin: Secondary | ICD-10-CM | POA: Diagnosis not present

## 2015-05-23 DIAGNOSIS — H540 Blindness, both eyes: Secondary | ICD-10-CM | POA: Diagnosis not present

## 2015-05-24 DIAGNOSIS — H540 Blindness, both eyes: Secondary | ICD-10-CM | POA: Diagnosis not present

## 2015-05-24 DIAGNOSIS — I2510 Atherosclerotic heart disease of native coronary artery without angina pectoris: Secondary | ICD-10-CM | POA: Diagnosis not present

## 2015-05-24 DIAGNOSIS — I10 Essential (primary) hypertension: Secondary | ICD-10-CM | POA: Diagnosis not present

## 2015-05-24 DIAGNOSIS — Z794 Long term (current) use of insulin: Secondary | ICD-10-CM | POA: Diagnosis not present

## 2015-05-24 DIAGNOSIS — M4806 Spinal stenosis, lumbar region: Secondary | ICD-10-CM | POA: Diagnosis not present

## 2015-05-24 DIAGNOSIS — E1165 Type 2 diabetes mellitus with hyperglycemia: Secondary | ICD-10-CM | POA: Diagnosis not present

## 2015-05-24 DIAGNOSIS — J449 Chronic obstructive pulmonary disease, unspecified: Secondary | ICD-10-CM | POA: Diagnosis not present

## 2015-05-24 DIAGNOSIS — E1140 Type 2 diabetes mellitus with diabetic neuropathy, unspecified: Secondary | ICD-10-CM | POA: Diagnosis not present

## 2015-05-27 ENCOUNTER — Ambulatory Visit (INDEPENDENT_AMBULATORY_CARE_PROVIDER_SITE_OTHER): Payer: Medicare Other | Admitting: Pharmacotherapy

## 2015-05-27 ENCOUNTER — Encounter: Payer: Self-pay | Admitting: Pharmacotherapy

## 2015-05-27 VITALS — BP 118/70 | HR 77 | Temp 98.1°F | Resp 18 | Ht 66.0 in | Wt 210.8 lb

## 2015-05-27 DIAGNOSIS — Z794 Long term (current) use of insulin: Secondary | ICD-10-CM | POA: Diagnosis not present

## 2015-05-27 DIAGNOSIS — E1165 Type 2 diabetes mellitus with hyperglycemia: Secondary | ICD-10-CM | POA: Diagnosis not present

## 2015-05-27 DIAGNOSIS — J449 Chronic obstructive pulmonary disease, unspecified: Secondary | ICD-10-CM | POA: Diagnosis not present

## 2015-05-27 DIAGNOSIS — I10 Essential (primary) hypertension: Secondary | ICD-10-CM

## 2015-05-27 DIAGNOSIS — E1141 Type 2 diabetes mellitus with diabetic mononeuropathy: Secondary | ICD-10-CM

## 2015-05-27 DIAGNOSIS — H540 Blindness, both eyes: Secondary | ICD-10-CM | POA: Diagnosis not present

## 2015-05-27 DIAGNOSIS — IMO0002 Reserved for concepts with insufficient information to code with codable children: Secondary | ICD-10-CM

## 2015-05-27 DIAGNOSIS — E1140 Type 2 diabetes mellitus with diabetic neuropathy, unspecified: Secondary | ICD-10-CM | POA: Diagnosis not present

## 2015-05-27 DIAGNOSIS — I2510 Atherosclerotic heart disease of native coronary artery without angina pectoris: Secondary | ICD-10-CM | POA: Diagnosis not present

## 2015-05-27 DIAGNOSIS — M4806 Spinal stenosis, lumbar region: Secondary | ICD-10-CM | POA: Diagnosis not present

## 2015-05-27 DIAGNOSIS — E1149 Type 2 diabetes mellitus with other diabetic neurological complication: Secondary | ICD-10-CM

## 2015-05-27 MED ORDER — INSULIN ASPART PROT & ASPART (70-30 MIX) 100 UNIT/ML PEN: 36.0000 [IU] | PEN_INJECTOR | Freq: Two times a day (BID) | SUBCUTANEOUS | Status: DC

## 2015-05-27 MED ORDER — OXYCODONE HCL 5 MG PO TABS: ORAL_TABLET | ORAL | Status: DC

## 2015-05-27 NOTE — Patient Instructions (Signed)
Increase insulin to 36 units twice daily

## 2015-05-27 NOTE — Progress Notes (Signed)
  Subjective:    Eddie Graves is a 81 y.o.African American male who presents for follow-up of Type 2 diabetes mellitus.   He was in the hospital 05/19/15 - 05/20/15 with CP.  Work up was positive for hypotension - corrected with IVF. A1C improved to 8.4% (was 11.3%) He started pre-mixed insulin 2 months ago.  Has been feeling OK since discharge from hospital. No medication changes.  He is injecting 30 units twice daily. He is SMBG twice per day. Forgot to bring his BG meter. He says BG is 150's in the morning. Can't remember his afternoon BG. Denies hypoglycemia.  Lowest BG:  97mg /dl  He is blind. Denies problems with feet.  Feel better. Some peripheral edema. Nocturia several times per night.  No exercise. Struggles with healthy eating.  Hospital sent Home Health RN to help with DM.  He is now able to check his BG by himself.  Continues to smoke - 2-3 cigarettes per day.  He has been trying to cut back.  Review of Systems A comprehensive review of systems was negative except for: Eyes: positive for blind Cardiovascular: positive for lower extremity edema Genitourinary: positive for nocturia    Objective:    BP 118/70 mmHg  Pulse 77  Temp(Src) 98.1 F (36.7 C) (Oral)  Resp 18  Ht 5\' 6"  (1.676 m)  Wt 210 lb 12.8 oz (95.618 kg)  BMI 34.04 kg/m2  SpO2 92%  General:  alert, cooperative and no distress  Oropharynx: normal findings: lips normal without lesions and gums healthy and abnormal findings: dentition: upper and lower dentures   Eyes:  negative findings: lids and lashes normal, positive findings: blind   Ears:  external ears normal        Lung: rales bilaterally  Heart:  regular rate and rhythm     Extremities: edema bilateral lower extremities   Skin: dry     Neuro: mental status, speech normal, alert and oriented x3   Lab Review GLUCOSE (mg/dL)  Date Value  191*  02/14/2015 174*  11/26/2014 140*   GLUCOSE, BLD (mg/dL)  Date Value   04/16/2015 121*  05/19/2015 196*  10/14/2014 284*   CO2 (mmol/L)  Date Value  05/20/2015 27  05/19/2015 27  03/19/2015 21   BUN (mg/dL)  Date Value  07/19/2015 12  05/19/2015 15  03/19/2015 15  02/14/2015 19  11/26/2014 11  10/14/2014 33*   CREATININE, SER (mg/dL)  Date Value  11/28/2014 1.30*  05/19/2015 1.81*  03/19/2015 1.27       Assessment:    Diabetes Mellitus type II, under fair control. A1C above target, but much improved - down 3% BP at goal <140/90   Plan:    1.  Rx changes: Increase insulin to 36 units twice daily 2.  Continue Tradjenta 5mg  QD. 3.  Continue to SMBG twice daily. 4.  Counseled on nutrition goals. 5.  Exercise goal is 30-45 minutes 5 x week. 6.  Counseled on hypoglycemia 7.  BP at goal <140/90 8.  Counseled on smoking cessation.

## 2015-05-30 ENCOUNTER — Encounter: Payer: Self-pay | Admitting: Internal Medicine

## 2015-05-30 ENCOUNTER — Ambulatory Visit (INDEPENDENT_AMBULATORY_CARE_PROVIDER_SITE_OTHER): Payer: Medicare Other | Admitting: Internal Medicine

## 2015-05-30 VITALS — BP 110/64 | HR 68 | Temp 97.4°F | Resp 20 | Ht 66.0 in | Wt 211.6 lb

## 2015-05-30 DIAGNOSIS — E1165 Type 2 diabetes mellitus with hyperglycemia: Secondary | ICD-10-CM | POA: Diagnosis not present

## 2015-05-30 DIAGNOSIS — E1140 Type 2 diabetes mellitus with diabetic neuropathy, unspecified: Secondary | ICD-10-CM | POA: Diagnosis not present

## 2015-05-30 DIAGNOSIS — E1141 Type 2 diabetes mellitus with diabetic mononeuropathy: Secondary | ICD-10-CM | POA: Diagnosis not present

## 2015-05-30 DIAGNOSIS — I25118 Atherosclerotic heart disease of native coronary artery with other forms of angina pectoris: Secondary | ICD-10-CM

## 2015-05-30 DIAGNOSIS — Z794 Long term (current) use of insulin: Secondary | ICD-10-CM | POA: Diagnosis not present

## 2015-05-30 DIAGNOSIS — Z720 Tobacco use: Secondary | ICD-10-CM

## 2015-05-30 DIAGNOSIS — I10 Essential (primary) hypertension: Secondary | ICD-10-CM | POA: Diagnosis not present

## 2015-05-30 DIAGNOSIS — I2510 Atherosclerotic heart disease of native coronary artery without angina pectoris: Secondary | ICD-10-CM | POA: Diagnosis not present

## 2015-05-30 DIAGNOSIS — E1149 Type 2 diabetes mellitus with other diabetic neurological complication: Secondary | ICD-10-CM

## 2015-05-30 DIAGNOSIS — M4806 Spinal stenosis, lumbar region: Secondary | ICD-10-CM | POA: Diagnosis not present

## 2015-05-30 DIAGNOSIS — H540 Blindness, both eyes: Secondary | ICD-10-CM | POA: Diagnosis not present

## 2015-05-30 DIAGNOSIS — IMO0002 Reserved for concepts with insufficient information to code with codable children: Secondary | ICD-10-CM

## 2015-05-30 DIAGNOSIS — J449 Chronic obstructive pulmonary disease, unspecified: Secondary | ICD-10-CM | POA: Diagnosis not present

## 2015-05-30 DIAGNOSIS — IMO0001 Reserved for inherently not codable concepts without codable children: Secondary | ICD-10-CM

## 2015-05-30 MED ORDER — ALBUTEROL SULFATE (2.5 MG/3ML) 0.083% IN NEBU: 2.5000 mg | INHALATION_SOLUTION | Freq: Four times a day (QID) | RESPIRATORY_TRACT | Status: DC | PRN

## 2015-05-30 NOTE — Progress Notes (Signed)
Patient ID: Eddie Graves, male   DOB: 1931-10-07, 81 y.o.   MRN: [id number]   Location:  [facility]  Goals of Care: Advanced Directive information Does patient have an advance directive?: No, Would patient like information on creating an advanced directive?: No - patient declined information   Chief Complaint  Patient presents with  . Hospitalization Follow-up    patient is wheezing, has no nebulizer machine    HPI: Patient is a 81 y.o. black male seen in the office today for hospital f/u.    Went to hospital after he'd been to church and laid down, but then woke up with chest pain and abdominal pain.  By the time he got to the hospital it was gone and he's had none since.  Had ekg, cardiac enzymes which were unremarkable and repeat echo as below.  No acute findings.  Has his f/u appt with Dr. Sharyn Lull on Tuesday.    Lady came to house and she showed him a machine to try to check his glucose, but strips were hard to locate.  Got them at [facility].  They were going to try to find a way to get them sent out to him every three months.  Says he is now able to check his glucose himself.  Demonstrated for me today and got 253.  Has been checking twice a day.   Had a cinnamon roll, juice, eggs and sausage for breakfast.   Ran out of insulin yesterday.    BP is actually at goal.    Review of Systems:  Review of Systems  Constitutional: Negative for fever, chills and malaise/fatigue.  HENT: Negative for congestion and hearing loss.   Eyes:       Blind  Respiratory: Negative for shortness of breath.   Cardiovascular: Positive for chest pain. Negative for palpitations.  Gastrointestinal: Positive for abdominal pain and constipation. Negative for blood in stool and melena.  Genitourinary: Negative for dysuria.  Musculoskeletal: Positive for back pain. Negative for falls.  Skin: Negative for rash.  Neurological: Negative for dizziness, loss of  consciousness, weakness and headaches.  Psychiatric/Behavioral: Positive for depression and memory loss.    Past Medical History  Diagnosis Date  . DM (diabetes mellitus) type II controlled peripheral vascular disorder   . Essential hypertension, benign   . Blind   . Headache(784.0)   . Sebaceous cyst   . Chronic airway obstruction, not elsewhere classified   . Cough   . Spinal stenosis, lumbar region, with neurogenic claudication   . Coronary atherosclerosis of native coronary artery   . Vitamin D deficiency   . Rash and other nonspecific skin eruption   . Other and unspecified hyperlipidemia   . Anxiety state, unspecified   . Depressive disorder, not elsewhere classified   . Legal blindness, as defined in [place]   . Reflux esophagitis   . Unspecified constipation   . Pain in joint, site unspecified   . Memory loss   . Palpitations   . Type II diabetes mellitus with neurological manifestations, uncontrolled   . H/O hiatal hernia     Past Surgical History  Procedure Laterality Date  . Coronary angioplasty with stent placement    . Hernia repair    . Eye surgery      Allergies  Allergen Reactions  . Flexeril [Cyclobenzaprine] Other (See Comments)    delirium   Medications: Patient's Medications  New Prescriptions   ALBUTEROL (PROVENTIL) (2.5 MG/3ML) 0.083% NEBULIZER  SOLUTION    Take 3 mLs (2.5 mg total) by nebulization every 6 (six) hours as needed for wheezing or shortness of breath.  Previous Medications   ALBUTEROL (PROVENTIL HFA;VENTOLIN HFA) 108 (90 BASE) MCG/ACT INHALER    Inhale 2 puffs into the lungs every 2 (two) hours as needed for wheezing or shortness of breath.   AMLODIPINE (NORVASC) 10 MG TABLET    Take 1 tablet (10 mg total) by mouth daily. For blood pressure   ASPIRIN EC 81 MG TABLET    Take 1 tablet (81 mg total) by mouth daily.   CHOLECALCIFEROL (VITAMIN D) 2000 UNITS CAPS    Take 1 capsule by mouth daily. For vitamin D repletion   CRESTOR 20 MG TABLET     Take 20 mg by mouth daily. For cholesterol   GARLIC PO    Take 75 mg by mouth daily.    GLUCOSE BLOOD TEST STRIP    Use to check blood sugar twice daily. Dx: E11.41   INSULIN ASPART PROTAMINE - ASPART (NOVOLOG MIX 70/30 FLEXPEN) (70-30) 100 UNIT/ML FLEXPEN    Inject 0.36 mLs (36 Units total) into the skin 2 (two) times daily.   LEVETIRACETAM (KEPPRA XR) 500 MG 24 HR TABLET    Take 2 tablets (1,000 mg total) by mouth daily. For seizures   LINAGLIPTIN (TRADJENTA) 5 MG TABS TABLET    Take 1 tablet (5 mg total) by mouth daily. For diabetes   LISINOPRIL (PRINIVIL,ZESTRIL) 10 MG TABLET    take 1 tablet by mouth once daily for blood pressure   METOPROLOL SUCCINATE (TOPROL-XL) 25 MG 24 HR TABLET    Take 1 tablet (25 mg total) by mouth daily. For blood pressure   MULTIPLE VITAMIN (MULITIVITAMIN WITH MINERALS) TABS    Take 1 tablet by mouth daily.   NEXIUM 40 MG CAPSULE    TAKE 1 CAPULE BY MOUTH ONCE DAILY BEFORE BREAKFAST   NITROGLYCERIN (NITRODUR - DOSED IN MG/24 HR) 0.4 MG/HR    Place 1 patch onto the skin daily. For chest pain   OMEGA 3 1200 MG CAPS    Take 2,400 mg by mouth daily. For cholesterol   OXYCODONE (ROXICODONE) 5 MG IMMEDIATE RELEASE TABLET    Take one tablet by mouth every 6 hours as needed for pain   SERTRALINE (ZOLOFT) 50 MG TABLET    Take 0.5 tablets (25 mg total) by mouth daily.  Modified Medications   No medications on file  Discontinued Medications   No medications on file    Physical Exam: Filed Vitals:   05/30/15 1526  BP: 110/64  Pulse: 68  Temp: 97.4 F (36.3 C)  TempSrc: Oral  Resp: 20  Height: 5\' 6"  (1.676 m)  Weight: 211 lb 9.6 oz (95.981 kg)  SpO2: 93%   Physical Exam  Constitutional: He is oriented to person, place, and time. He appears well-developed and well-nourished. No distress.  Overweight black male  HENT:  Head: Normocephalic and atraumatic.  Eyes:  blind  Cardiovascular: Normal rate and regular rhythm.   Pulmonary/Chest: Effort normal and  breath sounds normal.  Abdominal: Soft. Bowel sounds are normal.  Musculoskeletal:  Walks with cane and his son on his side  Neurological: He is alert and oriented to person, place, and time.  Skin: Skin is warm and dry.    Labs reviewed: Basic Metabolic Panel:  Recent Labs  1715  03/19/15 0905 05/19/15 1201 05/20/15 0534  NA  --   < > 139 136 140  K  --   < > 4.5 5.0 4.5  CL  --   < > 101 102 107  CO2  --   < > 21 27 27   GLUCOSE  --   < > 191* 196* 121*  BUN  --   < > 15 15 12   CREATININE 1.04  < > 1.27 1.81* 1.30*  CALCIUM  --   < > 9.5 9.7 9.3  TSH 0.728  --   --   --   --   < > = values in this interval not displayed. Liver Function Tests:  Recent Labs  08/20/14 1235 10/11/14 2148  03/19/15 0905 03/21/15 1534 05/19/15 1201  AST 30 39*  < > 56* 46* 30  ALT 20 47  < > 84* 64* 15*  ALKPHOS 91 93  < > 87 97 81  BILITOT 0.6 0.5  < > 0.3 0.2 0.7  PROT 7.4 7.3  < > 6.3 6.7 6.8  ALBUMIN 3.7 3.6  --   --   --  3.4*  < > = values in this interval not displayed. No results for input(s): LIPASE, AMYLASE in the last 8760 hours. No results for input(s): AMMONIA in the last 8760 hours. CBC:  Recent Labs  10/11/14 2148 10/12/14 0358 11/26/14 1156 03/19/15 0905 05/19/15 1201  WBC 8.3 9.6 5.7 6.8 7.9  NEUTROABS 5.8  --  2.9 3.8  --   HGB 13.3 13.6 12.2* 12.2* 12.3*  HCT 39.5 39.6 37.5 37.3* 37.6*  MCV 90.2 87.2 91 90 88.7  PLT 191 208 198  --  201   Lipid Panel:  Recent Labs  07/26/14 0947 11/26/14 1156 03/19/15 0905  CHOL 152 116 124  HDL 43 39* 48  LDLCALC 85 49 52  TRIG 120 138 122  CHOLHDL 3.5 3.0 2.6   Lab Results  Component Value Date   HGBA1C 8.4* 05/19/2015    Procedures since last visit: CXR reviewed 05/19/15 with bibasilar atelectasis Echo reviewed 05/20/15 EF 55-60%; grade 2 diastolic dysfunction  Assessment/Plan 1. Type II diabetes mellitus with neurological manifestations, uncontrolled -is actually checking his own glucose  twice a day with the talking meter he got a few years ago, but would never use -now just needs to eat better and take insulin as directed consistently -did demonstrate to me that he could use the meter properly at my request  2. COPD bronchitis - pt still does not have nebulizer machine--[facility] and [facility] given--vials were at pharmacy, but he didn't pick up SO New Rx was sent to pharmacy [facility]) for the nebs AND new rx was printed in office and given to CMA for use to get him a neb machine through [facility]--this was explained in detail to pt and also reviewed that R[facility] not [facility] b/c that's where they wanted something sent last time [facility] also sent his novolog there) - albuterol (PROVENTIL) (2.5 MG/3ML) 0.083% nebulizer solution; Take 3 mLs (2.5 mg total) by nebulization every 6 (six) hours as needed for wheezing or shortness of breath.  Dispense: 75 mL; Refill: 12 - DME Nebulizer machine  3. Essential hypertension, benign -bp actually at goal today also--has actually taken all of his bp medications and has them all -cont these w/o changes  4. Coronary artery disease involving native coronary artery with other forms of angina pectoris -cont secondary prevention of MI--is to be taking all of the appropriate  meds  5. Tobacco abuse -is still smoking off and on despite my counseling and his known problems from it--admits he's addicted  Labs/tests ordered: Orders Placed This Encounter  Procedures  . DME Nebulizer machine    Use every 6 hours as needed for shortness of breath or wheezing    Next appt:  Keep regular appt as scheduled  Suzan Manon L. Kataleena Holsapple, D.O. Geriatrics [facility] [address], [place] [address] Cell Phone (Mon-Fri 8am-5pm):  [phone] On Call:  [phone] & follow prompts after 5pm & weekends Office Phone:  [phone] Office Fax:   [phone]

## 2015-05-31 ENCOUNTER — Telehealth: Payer: Self-pay | Admitting: Internal Medicine

## 2015-05-31 ENCOUNTER — Telehealth: Payer: Self-pay | Admitting: *Deleted

## 2015-05-31 NOTE — Telephone Encounter (Signed)
[name] called, he needed authorization for the medication.  I called [facility] for a authorization request. Received authorization from pharmacy for Novolog from [place].  Given to [place] to follow up on authorization....[facility]

## 2015-05-31 NOTE — Telephone Encounter (Signed)
Aram Beecham called patient and informed him we are waiting on determination.

## 2015-05-31 NOTE — Telephone Encounter (Signed)
Prior Auth initiated for Novolog Mix 70/30 FlexPen. #:[id number]- Called and spoke with [facility]. Case #: [id number]. Clinical Review, determination in 48-72 hours. Member ID#: [id number]

## 2015-06-02 NOTE — Addendum Note (Signed)
Addended by: Bufford Spikes L on: 06/02/2015 12:20 PM   Modules accepted: Level of Service

## 2015-06-03 ENCOUNTER — Telehealth: Payer: Self-pay | Admitting: *Deleted

## 2015-06-03 DIAGNOSIS — Z794 Long term (current) use of insulin: Secondary | ICD-10-CM | POA: Diagnosis not present

## 2015-06-03 DIAGNOSIS — I10 Essential (primary) hypertension: Secondary | ICD-10-CM | POA: Diagnosis not present

## 2015-06-03 DIAGNOSIS — H540 Blindness, both eyes: Secondary | ICD-10-CM | POA: Diagnosis not present

## 2015-06-03 DIAGNOSIS — E1141 Type 2 diabetes mellitus with diabetic mononeuropathy: Secondary | ICD-10-CM | POA: Diagnosis not present

## 2015-06-03 DIAGNOSIS — I2510 Atherosclerotic heart disease of native coronary artery without angina pectoris: Secondary | ICD-10-CM | POA: Diagnosis not present

## 2015-06-03 DIAGNOSIS — M4806 Spinal stenosis, lumbar region: Secondary | ICD-10-CM | POA: Diagnosis not present

## 2015-06-03 DIAGNOSIS — E1165 Type 2 diabetes mellitus with hyperglycemia: Secondary | ICD-10-CM | POA: Diagnosis not present

## 2015-06-03 DIAGNOSIS — J449 Chronic obstructive pulmonary disease, unspecified: Secondary | ICD-10-CM | POA: Diagnosis not present

## 2015-06-03 DIAGNOSIS — E1140 Type 2 diabetes mellitus with diabetic neuropathy, unspecified: Secondary | ICD-10-CM | POA: Diagnosis not present

## 2015-06-03 MED ORDER — INSULIN LISPRO PROT & LISPRO (75-25 MIX) 100 UNIT/ML ~~LOC~~ SUSP: SUBCUTANEOUS | Status: DC

## 2015-06-03 NOTE — Telephone Encounter (Signed)
[facility] states that we have to try and fail [name] 75/25 Kwikpen. Per Dr. Adair Laundry to Humalog 75/25 Stephanie Coup same directions as Novolog.  Faxed to pharmacy and patient notified. Bethann Berkshire with [facility] was at patient's home, so directions and information was given to her and she stated that she would explain to patient. Agreed.

## 2015-06-03 NOTE — Telephone Encounter (Signed)
Received fax for additional information for prior Authorization for Novolog. Given to Dr. Renato Gails to review and sign. Printed OV notes to fax back with it. [facility] Fax: [phone]

## 2015-06-03 NOTE — Telephone Encounter (Signed)
Bethann Berkshire with [facility] called wanting recert orders for Homehealth. Gave her verbal orders. She was also in patients home when we spoke so I also gave her the order for patient's new insulin. We have to switch from Novolog to Humalog due to insurance not covering the Novolog per Dr. Shirleen Schirmer directions as novolog. Nurse will explain to patient. To fax to [facility].

## 2015-06-04 ENCOUNTER — Other Ambulatory Visit: Payer: Self-pay | Admitting: *Deleted

## 2015-06-04 DIAGNOSIS — I209 Angina pectoris, unspecified: Secondary | ICD-10-CM | POA: Diagnosis not present

## 2015-06-04 DIAGNOSIS — E1140 Type 2 diabetes mellitus with diabetic neuropathy, unspecified: Secondary | ICD-10-CM | POA: Diagnosis not present

## 2015-06-04 DIAGNOSIS — J449 Chronic obstructive pulmonary disease, unspecified: Secondary | ICD-10-CM | POA: Diagnosis not present

## 2015-06-04 DIAGNOSIS — K219 Gastro-esophageal reflux disease without esophagitis: Secondary | ICD-10-CM | POA: Diagnosis not present

## 2015-06-04 DIAGNOSIS — I2510 Atherosclerotic heart disease of native coronary artery without angina pectoris: Secondary | ICD-10-CM | POA: Diagnosis not present

## 2015-06-04 DIAGNOSIS — E119 Type 2 diabetes mellitus without complications: Secondary | ICD-10-CM | POA: Diagnosis not present

## 2015-06-04 MED ORDER — INSULIN LISPRO PROT & LISPRO (75-25 MIX) 100 UNIT/ML KWIKPEN: PEN_INJECTOR | SUBCUTANEOUS | Status: DC

## 2015-06-04 MED ORDER — ESOMEPRAZOLE MAGNESIUM 40 MG PO CPDR: DELAYED_RELEASE_CAPSULE | ORAL | Status: DC

## 2015-06-04 NOTE — Telephone Encounter (Signed)
[facility] 

## 2015-06-06 ENCOUNTER — Telehealth: Payer: Self-pay | Admitting: *Deleted

## 2015-06-06 DIAGNOSIS — Z794 Long term (current) use of insulin: Secondary | ICD-10-CM | POA: Diagnosis not present

## 2015-06-06 DIAGNOSIS — E1165 Type 2 diabetes mellitus with hyperglycemia: Secondary | ICD-10-CM | POA: Diagnosis not present

## 2015-06-06 DIAGNOSIS — I2510 Atherosclerotic heart disease of native coronary artery without angina pectoris: Secondary | ICD-10-CM | POA: Diagnosis not present

## 2015-06-06 DIAGNOSIS — J449 Chronic obstructive pulmonary disease, unspecified: Secondary | ICD-10-CM | POA: Diagnosis not present

## 2015-06-06 DIAGNOSIS — E1140 Type 2 diabetes mellitus with diabetic neuropathy, unspecified: Secondary | ICD-10-CM | POA: Diagnosis not present

## 2015-06-06 DIAGNOSIS — I10 Essential (primary) hypertension: Secondary | ICD-10-CM | POA: Diagnosis not present

## 2015-06-06 DIAGNOSIS — H540 Blindness, both eyes: Secondary | ICD-10-CM | POA: Diagnosis not present

## 2015-06-06 DIAGNOSIS — M4806 Spinal stenosis, lumbar region: Secondary | ICD-10-CM | POA: Diagnosis not present

## 2015-06-06 MED ORDER — METOPROLOL SUCCINATE ER 100 MG PO TB24: ORAL_TABLET | ORAL | Status: AC

## 2015-06-06 MED ORDER — AMLODIPINE BESYLATE 2.5 MG PO TABS: ORAL_TABLET | ORAL | Status: DC

## 2015-06-06 NOTE — Telephone Encounter (Signed)
Bethann Berkshire with [facility] called needing clarification on patient's medication. Stated that patient is taking Amlodipine 2.5mg  once daily instead of 10mg  we have on file and patient is taking Metoprolol 100mg  instead of the 25mg  we have on file. Nurse stated that patient's BP have been good. Do we need to continue what patient has been taking or change to what he is suppose to be on. Please Advise.

## 2015-06-06 NOTE — Telephone Encounter (Signed)
Bethann Berkshire Notified and agreed. I changed medication in list

## 2015-06-06 NOTE — Telephone Encounter (Signed)
Let's fix his list to how he's taking it.  His bp was actually good when he was here last, also.  Please inform them that he usually has his meds messed up.

## 2015-06-13 DIAGNOSIS — I2510 Atherosclerotic heart disease of native coronary artery without angina pectoris: Secondary | ICD-10-CM | POA: Diagnosis not present

## 2015-06-13 DIAGNOSIS — H540 Blindness, both eyes: Secondary | ICD-10-CM | POA: Diagnosis not present

## 2015-06-13 DIAGNOSIS — Z794 Long term (current) use of insulin: Secondary | ICD-10-CM | POA: Diagnosis not present

## 2015-06-13 DIAGNOSIS — I10 Essential (primary) hypertension: Secondary | ICD-10-CM | POA: Diagnosis not present

## 2015-06-13 DIAGNOSIS — E1140 Type 2 diabetes mellitus with diabetic neuropathy, unspecified: Secondary | ICD-10-CM | POA: Diagnosis not present

## 2015-06-13 DIAGNOSIS — J449 Chronic obstructive pulmonary disease, unspecified: Secondary | ICD-10-CM | POA: Diagnosis not present

## 2015-06-13 DIAGNOSIS — M4806 Spinal stenosis, lumbar region: Secondary | ICD-10-CM | POA: Diagnosis not present

## 2015-06-13 DIAGNOSIS — E1165 Type 2 diabetes mellitus with hyperglycemia: Secondary | ICD-10-CM | POA: Diagnosis not present

## 2015-06-17 DIAGNOSIS — I2510 Atherosclerotic heart disease of native coronary artery without angina pectoris: Secondary | ICD-10-CM | POA: Diagnosis not present

## 2015-06-17 DIAGNOSIS — E1165 Type 2 diabetes mellitus with hyperglycemia: Secondary | ICD-10-CM | POA: Diagnosis not present

## 2015-06-17 DIAGNOSIS — I10 Essential (primary) hypertension: Secondary | ICD-10-CM | POA: Diagnosis not present

## 2015-06-17 DIAGNOSIS — H540 Blindness, both eyes: Secondary | ICD-10-CM | POA: Diagnosis not present

## 2015-06-17 DIAGNOSIS — M4806 Spinal stenosis, lumbar region: Secondary | ICD-10-CM | POA: Diagnosis not present

## 2015-06-17 DIAGNOSIS — J449 Chronic obstructive pulmonary disease, unspecified: Secondary | ICD-10-CM | POA: Diagnosis not present

## 2015-06-17 DIAGNOSIS — Z794 Long term (current) use of insulin: Secondary | ICD-10-CM | POA: Diagnosis not present

## 2015-06-17 DIAGNOSIS — E1140 Type 2 diabetes mellitus with diabetic neuropathy, unspecified: Secondary | ICD-10-CM | POA: Diagnosis not present

## 2015-06-19 DIAGNOSIS — M179 Osteoarthritis of knee, unspecified: Secondary | ICD-10-CM | POA: Diagnosis not present

## 2015-06-25 DIAGNOSIS — M4806 Spinal stenosis, lumbar region: Secondary | ICD-10-CM | POA: Diagnosis not present

## 2015-06-25 DIAGNOSIS — I2510 Atherosclerotic heart disease of native coronary artery without angina pectoris: Secondary | ICD-10-CM | POA: Diagnosis not present

## 2015-06-25 DIAGNOSIS — E1165 Type 2 diabetes mellitus with hyperglycemia: Secondary | ICD-10-CM | POA: Diagnosis not present

## 2015-06-25 DIAGNOSIS — Z794 Long term (current) use of insulin: Secondary | ICD-10-CM | POA: Diagnosis not present

## 2015-06-25 DIAGNOSIS — H540 Blindness, both eyes: Secondary | ICD-10-CM | POA: Diagnosis not present

## 2015-06-25 DIAGNOSIS — I10 Essential (primary) hypertension: Secondary | ICD-10-CM | POA: Diagnosis not present

## 2015-06-25 DIAGNOSIS — E1140 Type 2 diabetes mellitus with diabetic neuropathy, unspecified: Secondary | ICD-10-CM | POA: Diagnosis not present

## 2015-06-25 DIAGNOSIS — J449 Chronic obstructive pulmonary disease, unspecified: Secondary | ICD-10-CM | POA: Diagnosis not present

## 2015-07-03 DIAGNOSIS — E1165 Type 2 diabetes mellitus with hyperglycemia: Secondary | ICD-10-CM | POA: Diagnosis not present

## 2015-07-03 DIAGNOSIS — I10 Essential (primary) hypertension: Secondary | ICD-10-CM | POA: Diagnosis not present

## 2015-07-03 DIAGNOSIS — M4806 Spinal stenosis, lumbar region: Secondary | ICD-10-CM | POA: Diagnosis not present

## 2015-07-03 DIAGNOSIS — H540 Blindness, both eyes: Secondary | ICD-10-CM | POA: Diagnosis not present

## 2015-07-03 DIAGNOSIS — J449 Chronic obstructive pulmonary disease, unspecified: Secondary | ICD-10-CM | POA: Diagnosis not present

## 2015-07-03 DIAGNOSIS — Z794 Long term (current) use of insulin: Secondary | ICD-10-CM | POA: Diagnosis not present

## 2015-07-03 DIAGNOSIS — I2510 Atherosclerotic heart disease of native coronary artery without angina pectoris: Secondary | ICD-10-CM | POA: Diagnosis not present

## 2015-07-03 DIAGNOSIS — E1140 Type 2 diabetes mellitus with diabetic neuropathy, unspecified: Secondary | ICD-10-CM | POA: Diagnosis not present

## 2015-07-09 ENCOUNTER — Other Ambulatory Visit: Payer: Self-pay | Admitting: *Deleted

## 2015-07-09 MED ORDER — LISINOPRIL 10 MG PO TABS: ORAL_TABLET | ORAL | Status: DC

## 2015-07-09 NOTE — Telephone Encounter (Signed)
[facility]

## 2015-07-10 DIAGNOSIS — I2510 Atherosclerotic heart disease of native coronary artery without angina pectoris: Secondary | ICD-10-CM | POA: Diagnosis not present

## 2015-07-10 DIAGNOSIS — I10 Essential (primary) hypertension: Secondary | ICD-10-CM | POA: Diagnosis not present

## 2015-07-10 DIAGNOSIS — E1165 Type 2 diabetes mellitus with hyperglycemia: Secondary | ICD-10-CM | POA: Diagnosis not present

## 2015-07-10 DIAGNOSIS — E1140 Type 2 diabetes mellitus with diabetic neuropathy, unspecified: Secondary | ICD-10-CM | POA: Diagnosis not present

## 2015-07-10 DIAGNOSIS — M4806 Spinal stenosis, lumbar region: Secondary | ICD-10-CM | POA: Diagnosis not present

## 2015-07-10 DIAGNOSIS — H540 Blindness, both eyes: Secondary | ICD-10-CM | POA: Diagnosis not present

## 2015-07-10 DIAGNOSIS — J449 Chronic obstructive pulmonary disease, unspecified: Secondary | ICD-10-CM | POA: Diagnosis not present

## 2015-07-10 DIAGNOSIS — Z794 Long term (current) use of insulin: Secondary | ICD-10-CM | POA: Diagnosis not present

## 2015-07-11 DIAGNOSIS — M169 Osteoarthritis of hip, unspecified: Secondary | ICD-10-CM | POA: Diagnosis not present

## 2015-07-15 ENCOUNTER — Other Ambulatory Visit: Payer: Self-pay | Admitting: *Deleted

## 2015-07-15 DIAGNOSIS — E1149 Type 2 diabetes mellitus with other diabetic neurological complication: Secondary | ICD-10-CM

## 2015-07-15 DIAGNOSIS — E1142 Type 2 diabetes mellitus with diabetic polyneuropathy: Secondary | ICD-10-CM

## 2015-07-15 DIAGNOSIS — IMO0002 Reserved for concepts with insufficient information to code with codable children: Secondary | ICD-10-CM

## 2015-07-15 DIAGNOSIS — E1165 Type 2 diabetes mellitus with hyperglycemia: Principal | ICD-10-CM

## 2015-07-15 MED ORDER — LINAGLIPTIN 5 MG PO TABS: 5.0000 mg | ORAL_TABLET | Freq: Every day | ORAL | Status: DC

## 2015-07-15 NOTE — Telephone Encounter (Signed)
[facility] 

## 2015-07-16 ENCOUNTER — Other Ambulatory Visit: Payer: Self-pay | Admitting: *Deleted

## 2015-07-16 MED ORDER — OXYCODONE HCL 5 MG PO TABS: ORAL_TABLET | ORAL | Status: DC

## 2015-07-16 NOTE — Telephone Encounter (Signed)
Patient requested and will pick up 

## 2015-07-17 DIAGNOSIS — Z794 Long term (current) use of insulin: Secondary | ICD-10-CM | POA: Diagnosis not present

## 2015-07-17 DIAGNOSIS — E1165 Type 2 diabetes mellitus with hyperglycemia: Secondary | ICD-10-CM | POA: Diagnosis not present

## 2015-07-17 DIAGNOSIS — J449 Chronic obstructive pulmonary disease, unspecified: Secondary | ICD-10-CM | POA: Diagnosis not present

## 2015-07-17 DIAGNOSIS — M4806 Spinal stenosis, lumbar region: Secondary | ICD-10-CM | POA: Diagnosis not present

## 2015-07-17 DIAGNOSIS — H540 Blindness, both eyes: Secondary | ICD-10-CM | POA: Diagnosis not present

## 2015-07-17 DIAGNOSIS — I2510 Atherosclerotic heart disease of native coronary artery without angina pectoris: Secondary | ICD-10-CM | POA: Diagnosis not present

## 2015-07-17 DIAGNOSIS — M169 Osteoarthritis of hip, unspecified: Secondary | ICD-10-CM | POA: Diagnosis not present

## 2015-07-17 DIAGNOSIS — I10 Essential (primary) hypertension: Secondary | ICD-10-CM | POA: Diagnosis not present

## 2015-07-17 DIAGNOSIS — E1140 Type 2 diabetes mellitus with diabetic neuropathy, unspecified: Secondary | ICD-10-CM | POA: Diagnosis not present

## 2015-07-24 DIAGNOSIS — J449 Chronic obstructive pulmonary disease, unspecified: Secondary | ICD-10-CM | POA: Diagnosis not present

## 2015-07-24 DIAGNOSIS — H540 Blindness, both eyes: Secondary | ICD-10-CM | POA: Diagnosis not present

## 2015-07-24 DIAGNOSIS — I10 Essential (primary) hypertension: Secondary | ICD-10-CM | POA: Diagnosis not present

## 2015-07-24 DIAGNOSIS — E1165 Type 2 diabetes mellitus with hyperglycemia: Secondary | ICD-10-CM | POA: Diagnosis not present

## 2015-07-24 DIAGNOSIS — Z794 Long term (current) use of insulin: Secondary | ICD-10-CM | POA: Diagnosis not present

## 2015-07-24 DIAGNOSIS — E1140 Type 2 diabetes mellitus with diabetic neuropathy, unspecified: Secondary | ICD-10-CM | POA: Diagnosis not present

## 2015-07-24 DIAGNOSIS — M4806 Spinal stenosis, lumbar region: Secondary | ICD-10-CM | POA: Diagnosis not present

## 2015-07-24 DIAGNOSIS — I2510 Atherosclerotic heart disease of native coronary artery without angina pectoris: Secondary | ICD-10-CM | POA: Diagnosis not present

## 2015-07-25 ENCOUNTER — Other Ambulatory Visit: Payer: Medicare Other

## 2015-07-29 ENCOUNTER — Encounter: Payer: Self-pay | Admitting: Internal Medicine

## 2015-07-29 ENCOUNTER — Ambulatory Visit: Payer: Medicare Other | Admitting: Internal Medicine

## 2015-07-31 DIAGNOSIS — I2510 Atherosclerotic heart disease of native coronary artery without angina pectoris: Secondary | ICD-10-CM | POA: Diagnosis not present

## 2015-07-31 DIAGNOSIS — J449 Chronic obstructive pulmonary disease, unspecified: Secondary | ICD-10-CM | POA: Diagnosis not present

## 2015-07-31 DIAGNOSIS — E1140 Type 2 diabetes mellitus with diabetic neuropathy, unspecified: Secondary | ICD-10-CM | POA: Diagnosis not present

## 2015-07-31 DIAGNOSIS — I10 Essential (primary) hypertension: Secondary | ICD-10-CM | POA: Diagnosis not present

## 2015-07-31 DIAGNOSIS — Z794 Long term (current) use of insulin: Secondary | ICD-10-CM | POA: Diagnosis not present

## 2015-07-31 DIAGNOSIS — H540 Blindness, both eyes: Secondary | ICD-10-CM | POA: Diagnosis not present

## 2015-07-31 DIAGNOSIS — M4806 Spinal stenosis, lumbar region: Secondary | ICD-10-CM | POA: Diagnosis not present

## 2015-07-31 DIAGNOSIS — E1165 Type 2 diabetes mellitus with hyperglycemia: Secondary | ICD-10-CM | POA: Diagnosis not present

## 2015-08-08 ENCOUNTER — Encounter: Payer: Self-pay | Admitting: Internal Medicine

## 2015-08-08 ENCOUNTER — Ambulatory Visit: Payer: Medicare Other | Admitting: Internal Medicine

## 2015-08-09 DIAGNOSIS — I2510 Atherosclerotic heart disease of native coronary artery without angina pectoris: Secondary | ICD-10-CM | POA: Diagnosis not present

## 2015-08-09 DIAGNOSIS — J449 Chronic obstructive pulmonary disease, unspecified: Secondary | ICD-10-CM | POA: Diagnosis not present

## 2015-08-09 DIAGNOSIS — M4806 Spinal stenosis, lumbar region: Secondary | ICD-10-CM | POA: Diagnosis not present

## 2015-08-09 DIAGNOSIS — Z794 Long term (current) use of insulin: Secondary | ICD-10-CM | POA: Diagnosis not present

## 2015-08-09 DIAGNOSIS — E1140 Type 2 diabetes mellitus with diabetic neuropathy, unspecified: Secondary | ICD-10-CM | POA: Diagnosis not present

## 2015-08-09 DIAGNOSIS — I10 Essential (primary) hypertension: Secondary | ICD-10-CM | POA: Diagnosis not present

## 2015-08-09 DIAGNOSIS — H540 Blindness, both eyes: Secondary | ICD-10-CM | POA: Diagnosis not present

## 2015-08-09 DIAGNOSIS — E1165 Type 2 diabetes mellitus with hyperglycemia: Secondary | ICD-10-CM | POA: Diagnosis not present

## 2015-08-15 DIAGNOSIS — I10 Essential (primary) hypertension: Secondary | ICD-10-CM | POA: Diagnosis not present

## 2015-08-15 DIAGNOSIS — M4806 Spinal stenosis, lumbar region: Secondary | ICD-10-CM | POA: Diagnosis not present

## 2015-08-15 DIAGNOSIS — E1140 Type 2 diabetes mellitus with diabetic neuropathy, unspecified: Secondary | ICD-10-CM | POA: Diagnosis not present

## 2015-08-15 DIAGNOSIS — E1165 Type 2 diabetes mellitus with hyperglycemia: Secondary | ICD-10-CM | POA: Diagnosis not present

## 2015-08-15 DIAGNOSIS — J449 Chronic obstructive pulmonary disease, unspecified: Secondary | ICD-10-CM | POA: Diagnosis not present

## 2015-08-15 DIAGNOSIS — H540 Blindness, both eyes: Secondary | ICD-10-CM | POA: Diagnosis not present

## 2015-08-15 DIAGNOSIS — I2510 Atherosclerotic heart disease of native coronary artery without angina pectoris: Secondary | ICD-10-CM | POA: Diagnosis not present

## 2015-08-15 DIAGNOSIS — Z794 Long term (current) use of insulin: Secondary | ICD-10-CM | POA: Diagnosis not present

## 2015-08-16 ENCOUNTER — Encounter: Payer: Self-pay | Admitting: Internal Medicine

## 2015-08-16 ENCOUNTER — Ambulatory Visit (INDEPENDENT_AMBULATORY_CARE_PROVIDER_SITE_OTHER): Payer: Medicare Other | Admitting: Internal Medicine

## 2015-08-16 VITALS — BP 138/80 | HR 87 | Temp 98.1°F | Resp 16 | Ht 66.0 in | Wt 214.4 lb

## 2015-08-16 DIAGNOSIS — J449 Chronic obstructive pulmonary disease, unspecified: Secondary | ICD-10-CM | POA: Diagnosis not present

## 2015-08-16 DIAGNOSIS — E1165 Type 2 diabetes mellitus with hyperglycemia: Secondary | ICD-10-CM

## 2015-08-16 DIAGNOSIS — Z720 Tobacco use: Secondary | ICD-10-CM | POA: Diagnosis not present

## 2015-08-16 DIAGNOSIS — M5030 Other cervical disc degeneration, unspecified cervical region: Secondary | ICD-10-CM | POA: Diagnosis not present

## 2015-08-16 DIAGNOSIS — IMO0001 Reserved for inherently not codable concepts without codable children: Secondary | ICD-10-CM

## 2015-08-16 DIAGNOSIS — B351 Tinea unguium: Secondary | ICD-10-CM | POA: Diagnosis not present

## 2015-08-16 DIAGNOSIS — E1141 Type 2 diabetes mellitus with diabetic mononeuropathy: Secondary | ICD-10-CM

## 2015-08-16 DIAGNOSIS — F329 Major depressive disorder, single episode, unspecified: Secondary | ICD-10-CM | POA: Diagnosis not present

## 2015-08-16 DIAGNOSIS — I25118 Atherosclerotic heart disease of native coronary artery with other forms of angina pectoris: Secondary | ICD-10-CM | POA: Diagnosis not present

## 2015-08-16 DIAGNOSIS — F32A Depression, unspecified: Secondary | ICD-10-CM

## 2015-08-16 DIAGNOSIS — M159 Polyosteoarthritis, unspecified: Secondary | ICD-10-CM

## 2015-08-16 DIAGNOSIS — E1149 Type 2 diabetes mellitus with other diabetic neurological complication: Secondary | ICD-10-CM

## 2015-08-16 DIAGNOSIS — Z23 Encounter for immunization: Secondary | ICD-10-CM

## 2015-08-16 DIAGNOSIS — IMO0002 Reserved for concepts with insufficient information to code with codable children: Secondary | ICD-10-CM

## 2015-08-16 MED ORDER — SERTRALINE HCL 50 MG PO TABS: 50.0000 mg | ORAL_TABLET | Freq: Every day | ORAL | Status: DC

## 2015-08-16 MED ORDER — ALBUTEROL SULFATE HFA 108 (90 BASE) MCG/ACT IN AERS: 2.0000 | INHALATION_SPRAY | RESPIRATORY_TRACT | Status: DC | PRN

## 2015-08-16 NOTE — Progress Notes (Signed)
Patient ID: Eddie Graves, male   DOB: January 16, 1931, 81 y.o.   MRN: [id number]   Location:  [facility]  Code Status: full code Goals of Care: Advanced Directive information Does patient have an advance directive?: No, Would patient like information on creating an advanced directive?: Yes - Educational materials given (at a prior appt)   Chief Complaint  Patient presents with  . Medical Management of Chronic Issues    3 month follow-up, not fasting today. Patient has BS machine to review reading. Patiient has cuff for comparison   . Pruritis    Itching on back x long time. Back itches so bad that it hurts   . Immunizations    Flu Vaccine   . Hand Pain    Bilateral hand pain, request for tylenol rx and gabapentin     HPI: Patient is a 81 y.o. black male seen in the office today for med mgt of chronic diseases and a couple of acute concerns including itching and hand pain.  Has a nurse coming to his house.  Wonders if he can get the nurse to set up his pillbox for him.   [facility]     DMII:  Reviewed readings in glucometer.  Taking glucose in the mornings fasting only.  All readings were less than 200 except 2 just over 200 and one of 500 after a chocolate candy binge.    Depression:  Is out of his zoloft. Eddie Graves is getting overwhelmed (his son) with helping him.  Says he worries and worries all of the time.  Itching on back remains severe.  Does ok if he has someone to put vaseline on his back which helps.   Bilateral hand pain.  Went to Dr. Myrtie Neither b/c fingers were locking up.  All the way across the palms and mcps hurts.  He gave him a candlewax type thing that you melt and put hands in a glove.  Was worse on right hand at first and now on left hand.  Is able to use them.  Has another appt in 3 wks.    Onychymycosis of left fingernails.    Hips hurt him in the morning--oxycodone and at night so takes twice daily  pain medication.  Also taking pain pill again from Dr. Montez Morita since Thursday.  Unclear what that is.  He's been better since taking it (? Another nsaid)  Sometimes when he wakes up, his left arm tingles for a bit, then it goes away.  Review of Systems:  Review of Systems  Constitutional: Negative for fever, chills and malaise/fatigue.  HENT: Negative for congestion and hearing loss.   Eyes:       Blind  Respiratory: Negative for shortness of breath.   Cardiovascular: Positive for leg swelling. Negative for chest pain and palpitations.  Gastrointestinal: Positive for constipation. Negative for abdominal pain, blood in stool and melena.  Genitourinary: Negative for dysuria and urgency.  Musculoskeletal: Positive for back pain and joint pain. Negative for falls.  Skin: Negative for itching and rash.  Neurological: Positive for tingling and sensory change. Negative for weakness and headaches.  Psychiatric/Behavioral: Positive for depression and memory loss.    Past Medical History  Diagnosis Date  . DM (diabetes mellitus) type II controlled peripheral vascular disorder   . Essential hypertension, benign   . Blind   . Headache(784.0)   . Sebaceous cyst   . Chronic airway obstruction, not elsewhere  classified   . Cough   . Spinal stenosis, lumbar region, with neurogenic claudication   . Coronary atherosclerosis of native coronary artery   . Vitamin D deficiency   . Rash and other nonspecific skin eruption   . Other and unspecified hyperlipidemia   . Anxiety state, unspecified   . Depressive disorder, not elsewhere classified   . Legal blindness, as defined in [place]   . Reflux esophagitis   . Unspecified constipation   . Pain in joint, site unspecified   . Memory loss   . Palpitations   . Type II diabetes mellitus with neurological manifestations, uncontrolled   . H/O hiatal hernia     Past Surgical History  Procedure Laterality Date  . Coronary angioplasty with stent placement     . Hernia repair    . Eye surgery      Allergies  Allergen Reactions  . Flexeril [Cyclobenzaprine] Other (See Comments)    delirium   Medications: Patient's Medications  New Prescriptions   No medications on file  Previous Medications   ALBUTEROL (PROVENTIL) (2.5 MG/3ML) 0.083% NEBULIZER SOLUTION    Take 3 mLs (2.5 mg total) by nebulization every 6 (six) hours as needed for wheezing or shortness of breath.   AMLODIPINE (NORVASC) 2.5 MG TABLET    Take one tablet by mouth once daily for blood pressure   ASPIRIN EC 81 MG TABLET    Take 1 tablet (81 mg total) by mouth daily.   CHOLECALCIFEROL (VITAMIN D) 2000 UNITS CAPS    Take 1 capsule by mouth daily. For vitamin D repletion   CRESTOR 20 MG TABLET    Take 20 mg by mouth daily. For cholesterol   ESOMEPRAZOLE (NEXIUM) 40 MG CAPSULE    Take one capsule by mouth once daily before breakfast for stomach   GARLIC PO    Take 75 mg by mouth daily.    GLUCOSE BLOOD TEST STRIP    Use to check blood sugar twice daily. Dx: E11.41   INSULIN LISPRO PROT & LISPRO ([name] 75/25 KWIKPEN) (75-25) 100 UNIT/ML KWIKPEN    Inject 36 units into the skin 2 times daily to control blood sugar.   LEVETIRACETAM (KEPPRA XR) 500 MG 24 HR TABLET    Take 2 tablets (1,000 mg total) by mouth daily. For seizures   LINAGLIPTIN (TRADJENTA) 5 MG TABS TABLET    Take 1 tablet (5 mg total) by mouth daily. For diabetes   LISINOPRIL (PRINIVIL,ZESTRIL) 10 MG TABLET    Take one tablet by mouth once daily for blood pressure   METOPROLOL SUCCINATE (TOPROL-XL) 100 MG 24 HR TABLET    Take one tablet by mouth once daily for blood pressure   MULTIPLE VITAMIN (MULITIVITAMIN WITH MINERALS) TABS    Take 1 tablet by mouth daily.   NITROGLYCERIN (NITRODUR - DOSED IN MG/24 HR) 0.4 MG/HR    Place 1 patch onto the skin daily. For chest pain   OMEGA 3 1200 MG CAPS    Take 2,400 mg by mouth daily. For cholesterol   OXYCODONE (ROXICODONE) 5 MG IMMEDIATE RELEASE TABLET    Take one tablet by  mouth every 6 hours as needed for pain  Modified Medications   Modified Medication Previous Medication   ALBUTEROL (PROVENTIL HFA;VENTOLIN HFA) 108 (90 BASE) MCG/ACT INHALER albuterol (PROVENTIL HFA;VENTOLIN HFA) 108 (90 BASE) MCG/ACT inhaler      Inhale 2 puffs into the lungs every 2 (two) hours as needed for wheezing or shortness of breath.  Inhale 2 puffs into the lungs every 2 (two) hours as needed for wheezing or shortness of breath.   SERTRALINE (ZOLOFT) 50 MG TABLET sertraline (ZOLOFT) 50 MG tablet      Take 1 tablet (50 mg total) by mouth daily.    Take 0.5 tablets (25 mg total) by mouth daily.  Discontinued Medications   No medications on file    Physical Exam: Filed Vitals:   08/16/15 0908  BP: 138/80  Pulse: 87  Temp: 98.1 F (36.7 C)  TempSrc: Oral  Resp: 16  Height: 5\' 6"  (1.676 m)  Weight: 214 lb 6.4 oz (97.251 kg)  SpO2: 97%   Physical Exam  Constitutional: He is oriented to person, place, and time. He appears well-developed and well-nourished. No distress.  Cardiovascular: Normal rate, regular rhythm, normal heart sounds and intact distal pulses.   Pulmonary/Chest: Effort normal. No respiratory distress. He has wheezes.  Abdominal: Soft. Bowel sounds are normal. He exhibits no distension. There is no tenderness.  Musculoskeletal: Normal range of motion.  Some tenderness and swelling over his MCPs on right >left hands  Neurological: He is alert and oriented to person, place, and time.  Skin: Skin is warm and dry.  Dry scaly skin on his back; left hand fingernails are thickened and dark and he's filed and nibbled them 1/2 way down the nailbed  Psychiatric:  Flatter affect today--just had argument with grandson    Labs reviewed: Basic Metabolic Panel:  Recent Labs  1715  03/19/15 0905 05/19/15 1201 05/20/15 0534  NA  --   < > 139 136 140  K  --   < > 4.5 5.0 4.5  CL  --   < > 101 102 107  CO2  --   < > 21 27 27   GLUCOSE  --   < > 191* 196*  121*  BUN  --   < > 15 15 12   CREATININE 1.04  < > 1.27 1.81* 1.30*  CALCIUM  --   < > 9.5 9.7 9.3  TSH 0.728  --   --   --   --   < > = values in this interval not displayed. Liver Function Tests:  Recent Labs  08/20/14 1235 10/11/14 2148  03/19/15 0905 03/21/15 1534 05/19/15 1201  AST 30 39*  < > 56* 46* 30  ALT 20 47  < > 84* 64* 15*  ALKPHOS 91 93  < > 87 97 81  BILITOT 0.6 0.5  < > 0.3 0.2 0.7  PROT 7.4 7.3  < > 6.3 6.7 6.8  ALBUMIN 3.7 3.6  --   --   --  3.4*  < > = values in this interval not displayed. No results for input(s): LIPASE, AMYLASE in the last 8760 hours. No results for input(s): AMMONIA in the last 8760 hours. CBC:  Recent Labs  10/11/14 2148 10/12/14 0358 11/26/14 1156 03/19/15 0905 05/19/15 1201  WBC 8.3 9.6 5.7 6.8 7.9  NEUTROABS 5.8  --  2.9 3.8  --   HGB 13.3 13.6 12.2* 12.2* 12.3*  HCT 39.5 39.6 37.5 37.3* 37.6*  MCV 90.2 87.2 91 90 88.7  PLT 191 208 198  --  201   Lipid Panel:  Recent Labs  11/26/14 1156 03/19/15 0905  CHOL 116 124  HDL 39* 48  LDLCALC 49 52  TRIG 138 122  CHOLHDL 3.0 2.6   Lab Results  Component Value Date   HGBA1C 8.4* 05/19/2015   Assessment/Plan  1. Depression - has run out of his zoloft and actually more depressed lately so will increase to 50mg  again--seems he did better on a higher dosage before anyway -has increased family stressors with son and grandson going to college and does not appreciate what he's done for him (bought him car!) - sertraline (ZOLOFT) 50 MG tablet; Take 1 tablet (50 mg total) by mouth daily.  Dispense: 30 tablet; Refill: 3  2. Generalized osteoarthritis of hand - keep f/u with Dr. Myrtie Neither re: thin--apparently he had suggested a paraffin wax type of machine to help his hands which was too expensive for him to afford -seems he has some trigger fingers developing on right greater than left hands--?steroid injection for this if worsens would be ideal, but does have difficult to  control diabetes (primarily due to noncompliance of his family who buy him his food and don't fill his pillbox)  3. Type II diabetes mellitus with neurological manifestations, uncontrolled - has been uncontrolled, but a bit improved, will recheck hba1c today b/c I'm unsure if he'll show for the visit before his appt with Cathey--can't check lipids b/c he did eat breakfast -is checking cbgs only fasting, none later in day, but, with the exception of one extreme high after eating chocolate, have been good -supposedly he is getting tradjenta and mixed insulin twice daily - Hemoglobin A1c  4. Onychomycosis -of fingernails of left hand -counseled on avoiding picking/nibbling the nails -use otc antifungal nailpolish daily for best results -is not a good candidate for lamisil pills  5. Coronary artery disease involving native coronary artery with other forms of angina pectoris - no recent angina, sob, edema -has been stable with current regimen including his crestor, metoprolol, lisinopril, asa 81 - Comprehensive metabolic panel  6. Degenerative disc disease, cervical -continues with occasional neuropathic pain down his left arm from his neck in the morning, but very short-lived, and does not bother him ongoing -continues on oxycodone for his chronic back pain with benefit bid and is apparently on something else once again from Dr. Myrtie Neither, hopefully not another nsaid  7. Tobacco abuse -ongoing--family members don't fill his pillbox or buy him salads, but have been buying him his cigarettes -is smoking more due to family stressors  8. Need for prophylactic vaccination and inoculation against influenza - Flu Vaccine QUAD 36+ mos PF IM (Fluarix & Fluzone Quad PF) was given  9. COPD bronchitis -ongoing--breathing has been tighter with wheezing b/c he's smoking again and has not had any albuterol inhalers so I renewed his Rx - albuterol (PROVENTIL HFA;VENTOLIN HFA) 108 (90 BASE) MCG/ACT  inhaler; Inhale 2 puffs into the lungs every 2 (two) hours as needed for wheezing or shortness of breath.  Dispense: 18 g; Refill: 3  Labs/tests ordered:  hba1c today Next appt:  6 wks for med mgt and keep upcoming visit with Cathey  Casyn Becvar L. Tasmin Exantus, D.O. Geriatrics [facility] [address], [place] [address] Cell Phone (Mon-Fri 8am-5pm):  [phone] On Call:  [phone] & follow prompts after 5pm & weekends Office Phone:  [phone] Office Fax:  [phone]

## 2015-08-17 LAB — COMPREHENSIVE METABOLIC PANEL
ALT: 13 IU/L (ref 0–44)
AST: 22 IU/L (ref 0–40)
Albumin/Globulin Ratio: 1.4 (ref 1.1–2.5)
Albumin: 4.2 g/dL (ref 3.5–4.7)
Alkaline Phosphatase: 110 IU/L (ref 39–117)
BUN/Creatinine Ratio: 10 (ref 10–22)
BUN: 14 mg/dL (ref 8–27)
Bilirubin Total: 0.3 mg/dL (ref 0.0–1.2)
CO2: 23 mmol/L (ref 18–29)
Calcium: 9.8 mg/dL (ref 8.6–10.2)
Chloride: 97 mmol/L (ref 97–108)
Creatinine, Ser: 1.39 mg/dL — ABNORMAL HIGH (ref 0.76–1.27)
GFR calc Af Amer: 54 mL/min/{1.73_m2} — ABNORMAL LOW (ref >59)
GFR calc non Af Amer: 47 mL/min/{1.73_m2} — ABNORMAL LOW (ref >59)
Globulin, Total: 3 g/dL (ref 1.5–4.5)
Glucose: 151 mg/dL — ABNORMAL HIGH (ref 65–99)
Potassium: 4.6 mmol/L (ref 3.5–5.2)
Sodium: 138 mmol/L (ref 134–144)
Total Protein: 7.2 g/dL (ref 6.0–8.5)

## 2015-08-17 LAB — HEMOGLOBIN A1C
Est. average glucose Bld gHb Est-mCnc: 194 mg/dL
Hgb A1c MFr Bld: 8.4 % — ABNORMAL HIGH (ref 4.8–5.6)

## 2015-08-21 DIAGNOSIS — I10 Essential (primary) hypertension: Secondary | ICD-10-CM | POA: Diagnosis not present

## 2015-08-21 DIAGNOSIS — M4806 Spinal stenosis, lumbar region: Secondary | ICD-10-CM | POA: Diagnosis not present

## 2015-08-21 DIAGNOSIS — E1140 Type 2 diabetes mellitus with diabetic neuropathy, unspecified: Secondary | ICD-10-CM | POA: Diagnosis not present

## 2015-08-21 DIAGNOSIS — Z794 Long term (current) use of insulin: Secondary | ICD-10-CM | POA: Diagnosis not present

## 2015-08-21 DIAGNOSIS — H540 Blindness, both eyes: Secondary | ICD-10-CM | POA: Diagnosis not present

## 2015-08-21 DIAGNOSIS — E1165 Type 2 diabetes mellitus with hyperglycemia: Secondary | ICD-10-CM | POA: Diagnosis not present

## 2015-08-21 DIAGNOSIS — J449 Chronic obstructive pulmonary disease, unspecified: Secondary | ICD-10-CM | POA: Diagnosis not present

## 2015-08-21 DIAGNOSIS — I2510 Atherosclerotic heart disease of native coronary artery without angina pectoris: Secondary | ICD-10-CM | POA: Diagnosis not present

## 2015-08-23 ENCOUNTER — Other Ambulatory Visit: Payer: Self-pay | Admitting: *Deleted

## 2015-08-23 MED ORDER — OXYCODONE HCL 5 MG PO TABS: ORAL_TABLET | ORAL | Status: DC

## 2015-08-23 NOTE — Telephone Encounter (Signed)
Patient requested and will pick up 

## 2015-08-26 DIAGNOSIS — E1141 Type 2 diabetes mellitus with diabetic mononeuropathy: Secondary | ICD-10-CM | POA: Diagnosis not present

## 2015-08-29 ENCOUNTER — Other Ambulatory Visit: Payer: Medicare Other

## 2015-08-30 ENCOUNTER — Other Ambulatory Visit: Payer: Medicare Other

## 2015-08-30 DIAGNOSIS — I25118 Atherosclerotic heart disease of native coronary artery with other forms of angina pectoris: Secondary | ICD-10-CM | POA: Diagnosis not present

## 2015-08-30 DIAGNOSIS — IMO0002 Reserved for concepts with insufficient information to code with codable children: Secondary | ICD-10-CM

## 2015-08-30 DIAGNOSIS — E1165 Type 2 diabetes mellitus with hyperglycemia: Principal | ICD-10-CM

## 2015-08-30 DIAGNOSIS — E1149 Type 2 diabetes mellitus with other diabetic neurological complication: Secondary | ICD-10-CM

## 2015-08-30 DIAGNOSIS — E1141 Type 2 diabetes mellitus with diabetic mononeuropathy: Secondary | ICD-10-CM | POA: Diagnosis not present

## 2015-08-31 LAB — LIPID PANEL
Chol/HDL Ratio: 3.1 ratio units (ref 0.0–5.0)
Cholesterol, Total: 107 mg/dL (ref 100–199)
HDL: 35 mg/dL — ABNORMAL LOW (ref >39)
LDL Calculated: 52 mg/dL (ref 0–99)
Triglycerides: 99 mg/dL (ref 0–149)
VLDL Cholesterol Cal: 20 mg/dL (ref 5–40)

## 2015-08-31 LAB — COMPREHENSIVE METABOLIC PANEL
ALT: 18 IU/L (ref 0–44)
AST: 26 IU/L (ref 0–40)
Albumin/Globulin Ratio: 1.4 (ref 1.1–2.5)
Albumin: 4.2 g/dL (ref 3.5–4.7)
Alkaline Phosphatase: 104 IU/L (ref 39–117)
BUN/Creatinine Ratio: 14 (ref 10–22)
BUN: 21 mg/dL (ref 8–27)
Bilirubin Total: 0.3 mg/dL (ref 0.0–1.2)
CO2: 22 mmol/L (ref 18–29)
Calcium: 9.6 mg/dL (ref 8.6–10.2)
Chloride: 99 mmol/L (ref 97–108)
Creatinine, Ser: 1.46 mg/dL — ABNORMAL HIGH (ref 0.76–1.27)
GFR calc Af Amer: 51 mL/min/{1.73_m2} — ABNORMAL LOW (ref >59)
GFR calc non Af Amer: 44 mL/min/{1.73_m2} — ABNORMAL LOW (ref >59)
Globulin, Total: 3 g/dL (ref 1.5–4.5)
Glucose: 159 mg/dL — ABNORMAL HIGH (ref 65–99)
Potassium: 4.1 mmol/L (ref 3.5–5.2)
Sodium: 138 mmol/L (ref 134–144)
Total Protein: 7.2 g/dL (ref 6.0–8.5)

## 2015-08-31 LAB — CBC WITH DIFFERENTIAL/PLATELET
Basophils Absolute: 0 10*3/uL (ref 0.0–0.2)
Basos: 1 %
EOS (ABSOLUTE): 0.4 10*3/uL (ref 0.0–0.4)
Eos: 5 %
Hematocrit: 39.8 % (ref 37.5–51.0)
Hemoglobin: 12.8 g/dL (ref 12.6–17.7)
Immature Grans (Abs): 0 10*3/uL (ref 0.0–0.1)
Immature Granulocytes: 0 %
Lymphocytes Absolute: 2.5 10*3/uL (ref 0.7–3.1)
Lymphs: 32 %
MCH: 27.1 pg (ref 26.6–33.0)
MCHC: 32.2 g/dL (ref 31.5–35.7)
MCV: 84 fL (ref 79–97)
Monocytes Absolute: 0.8 10*3/uL (ref 0.1–0.9)
Monocytes: 11 %
Neutrophils Absolute: 4 10*3/uL (ref 1.4–7.0)
Neutrophils: 51 %
Platelets: 209 10*3/uL (ref 150–379)
RBC: 4.72 x10E6/uL (ref 4.14–5.80)
RDW: 14.2 % (ref 12.3–15.4)
WBC: 7.8 10*3/uL (ref 3.4–10.8)

## 2015-08-31 LAB — HEMOGLOBIN A1C
Est. average glucose Bld gHb Est-mCnc: 203 mg/dL
Hgb A1c MFr Bld: 8.7 % — ABNORMAL HIGH (ref 4.8–5.6)

## 2015-09-02 ENCOUNTER — Encounter: Payer: Self-pay | Admitting: Pharmacotherapy

## 2015-09-02 ENCOUNTER — Ambulatory Visit (INDEPENDENT_AMBULATORY_CARE_PROVIDER_SITE_OTHER): Payer: Medicare Other | Admitting: Pharmacotherapy

## 2015-09-02 VITALS — BP 122/66 | HR 81 | Temp 97.9°F | Resp 20 | Ht 66.0 in | Wt 213.8 lb

## 2015-09-02 DIAGNOSIS — E1165 Type 2 diabetes mellitus with hyperglycemia: Principal | ICD-10-CM

## 2015-09-02 DIAGNOSIS — IMO0002 Reserved for concepts with insufficient information to code with codable children: Secondary | ICD-10-CM

## 2015-09-02 DIAGNOSIS — E1149 Type 2 diabetes mellitus with other diabetic neurological complication: Secondary | ICD-10-CM

## 2015-09-02 DIAGNOSIS — E1141 Type 2 diabetes mellitus with diabetic mononeuropathy: Secondary | ICD-10-CM | POA: Diagnosis not present

## 2015-09-02 DIAGNOSIS — I10 Essential (primary) hypertension: Secondary | ICD-10-CM | POA: Diagnosis not present

## 2015-09-02 NOTE — Progress Notes (Signed)
  Subjective:    Eddie Graves is a 81 y.o.African American male who presents for follow-up of Type 2 diabetes mellitus.   A1C now up to 8.7% (was 8.4%)  He is having problems getting his insulin twice daily.  He doesn't have help, he doesn't always take his insulin.  Admits to missing insulin doses at least 3-4 times per week. No hypoglycemia.  Not making healthy food choices. No exercise.  Getting to the [facility] is a problem. He is blind. Denies problems with feet. Has peripheral edema. Nocturia 4-5 times He is trying to drink more water.  Review of Systems A comprehensive review of systems was negative except for: Eyes: positive for he is blind Cardiovascular: positive for lower extremity edema Genitourinary: positive for nocturia    Objective:    BP 122/66 mmHg  Pulse 81  Temp(Src) 97.9 F (36.6 C) (Oral)  Resp 20  Ht 5\' 6"  (1.676 m)  Wt 213 lb 12.8 oz (96.979 kg)  BMI 34.52 kg/m2  SpO2 95%  General:  alert, cooperative and no distress  Oropharynx: normal findings: lips normal without lesions and gums healthy   Eyes:  negative findings: lids and lashes normal, positive findings: he is blind   Ears:  external ears normal        Lung: clear to auscultation bilaterally  Heart:  regular rate and rhythm     Extremities: edema bilateral lower extremities  Skin: dry     Neuro: mental status, speech normal, alert and oriented x3   Lab Review GLUCOSE (mg/dL)  Date Value  159*  08/16/2015 151*  03/19/2015 191*   GLUCOSE, BLD (mg/dL)  Date Value  05/19/2015 121*  05/19/2015 196*  10/14/2014 284*   CO2 (mmol/L)  Date Value  08/30/2015 22  08/16/2015 23  05/20/2015 27   BUN (mg/dL)  Date Value  05/22/2015 21  08/16/2015 14  05/20/2015 12  05/19/2015 15  03/19/2015 15  10/14/2014 33*   CREATININE, SER (mg/dL)  Date Value  13/07/2014 1.46*  08/16/2015 1.39*  05/20/2015 1.30*       Assessment:    Diabetes Mellitus type II, under poor  control. due to missed doses of insulin A1C above goal <7% BP at goal <140/90   Plan:    1.  Rx changes: none.  Needs to take insulin as prescribed.  Only hold dose if BG <80. 2.  Continue [name] 75/25 36 units twice daily.  3.  Counseled on nutrition goals. 4.  Counseled on need for routine exercise.  Goal is 30-45 minutes 5 x week. 5.  Son is to continue to fill pillbox for him to assist with compliance. 6.  BP at goal <140/90.

## 2015-09-02 NOTE — Patient Instructions (Signed)
Take insulin 36 units twice daily Only hold insulin if sugar <80

## 2015-09-06 ENCOUNTER — Telehealth: Payer: Self-pay | Admitting: *Deleted

## 2015-09-06 DIAGNOSIS — J449 Chronic obstructive pulmonary disease, unspecified: Secondary | ICD-10-CM | POA: Diagnosis not present

## 2015-09-06 DIAGNOSIS — H540 Blindness, both eyes: Secondary | ICD-10-CM | POA: Diagnosis not present

## 2015-09-06 DIAGNOSIS — E1165 Type 2 diabetes mellitus with hyperglycemia: Secondary | ICD-10-CM | POA: Diagnosis not present

## 2015-09-06 DIAGNOSIS — M4806 Spinal stenosis, lumbar region: Secondary | ICD-10-CM | POA: Diagnosis not present

## 2015-09-06 DIAGNOSIS — I10 Essential (primary) hypertension: Secondary | ICD-10-CM | POA: Diagnosis not present

## 2015-09-06 DIAGNOSIS — Z794 Long term (current) use of insulin: Secondary | ICD-10-CM | POA: Diagnosis not present

## 2015-09-06 DIAGNOSIS — E1140 Type 2 diabetes mellitus with diabetic neuropathy, unspecified: Secondary | ICD-10-CM | POA: Diagnosis not present

## 2015-09-06 DIAGNOSIS — I2510 Atherosclerotic heart disease of native coronary artery without angina pectoris: Secondary | ICD-10-CM | POA: Diagnosis not present

## 2015-09-06 NOTE — Telephone Encounter (Signed)
Bethann Berkshire with [facility] called with clarifications on patient's insulin. Informed her that he is to take [name] 36 units twice daily and Hold if BS <80. Nurse agreed and will give to patient as instructed.

## 2015-09-11 DIAGNOSIS — I2510 Atherosclerotic heart disease of native coronary artery without angina pectoris: Secondary | ICD-10-CM | POA: Diagnosis not present

## 2015-09-11 DIAGNOSIS — E1165 Type 2 diabetes mellitus with hyperglycemia: Secondary | ICD-10-CM | POA: Diagnosis not present

## 2015-09-11 DIAGNOSIS — H540 Blindness, both eyes: Secondary | ICD-10-CM | POA: Diagnosis not present

## 2015-09-11 DIAGNOSIS — J449 Chronic obstructive pulmonary disease, unspecified: Secondary | ICD-10-CM | POA: Diagnosis not present

## 2015-09-11 DIAGNOSIS — Z794 Long term (current) use of insulin: Secondary | ICD-10-CM | POA: Diagnosis not present

## 2015-09-11 DIAGNOSIS — M4806 Spinal stenosis, lumbar region: Secondary | ICD-10-CM | POA: Diagnosis not present

## 2015-09-11 DIAGNOSIS — E1140 Type 2 diabetes mellitus with diabetic neuropathy, unspecified: Secondary | ICD-10-CM | POA: Diagnosis not present

## 2015-09-11 DIAGNOSIS — I10 Essential (primary) hypertension: Secondary | ICD-10-CM | POA: Diagnosis not present

## 2015-09-12 ENCOUNTER — Other Ambulatory Visit: Payer: Self-pay

## 2015-09-12 ENCOUNTER — Telehealth: Payer: Self-pay

## 2015-09-12 DIAGNOSIS — F32A Depression, unspecified: Secondary | ICD-10-CM

## 2015-09-12 DIAGNOSIS — F329 Major depressive disorder, single episode, unspecified: Secondary | ICD-10-CM

## 2015-09-12 DIAGNOSIS — R569 Unspecified convulsions: Secondary | ICD-10-CM

## 2015-09-12 MED ORDER — LEVETIRACETAM ER 500 MG PO TB24
1000.0000 mg | ORAL_TABLET | Freq: Every day | ORAL | Status: DC
Start: 2015-09-12 — End: 2016-02-14

## 2015-09-12 MED ORDER — SERTRALINE HCL 50 MG PO TABS: 50.0000 mg | ORAL_TABLET | Freq: Every day | ORAL | Status: DC

## 2015-09-12 NOTE — Addendum Note (Signed)
Addended by: Particia Lather C on: 09/12/2015 02:50 PM   Modules accepted: Orders

## 2015-09-12 NOTE — Telephone Encounter (Signed)
Called nurse Eddie Graves back informed her of what Dr. Renato Gails said and that she believed that he needed the Zoloft, Eddie Graves   said she would discuss this with patient at her next visit with him. Prescription sent

## 2015-09-12 NOTE — Telephone Encounter (Signed)
Eddie Graves the nurse called asked for refill of Keppra and to send prescription to [facility], prescription faxed. Nurse wanted to know if you had stopped the  patients Zolft because the patient had stopped taking this medication and said you were aware of this from his last visit when he said it made him dizzy. Please advise

## 2015-09-12 NOTE — Telephone Encounter (Signed)
Pt had told me he was out of his zoloft not that he got dizzy from it.  I think he is dizzy from uncontrolled diabetes.  He needs the antidepressant desperately.

## 2015-09-18 ENCOUNTER — Telehealth: Payer: Self-pay

## 2015-09-18 DIAGNOSIS — E1140 Type 2 diabetes mellitus with diabetic neuropathy, unspecified: Secondary | ICD-10-CM | POA: Diagnosis not present

## 2015-09-18 DIAGNOSIS — J449 Chronic obstructive pulmonary disease, unspecified: Secondary | ICD-10-CM | POA: Diagnosis not present

## 2015-09-18 DIAGNOSIS — H540 Blindness, both eyes: Secondary | ICD-10-CM | POA: Diagnosis not present

## 2015-09-18 DIAGNOSIS — Z794 Long term (current) use of insulin: Secondary | ICD-10-CM | POA: Diagnosis not present

## 2015-09-18 DIAGNOSIS — I10 Essential (primary) hypertension: Secondary | ICD-10-CM | POA: Diagnosis not present

## 2015-09-18 DIAGNOSIS — E1165 Type 2 diabetes mellitus with hyperglycemia: Secondary | ICD-10-CM | POA: Diagnosis not present

## 2015-09-18 DIAGNOSIS — M169 Osteoarthritis of hip, unspecified: Secondary | ICD-10-CM | POA: Diagnosis not present

## 2015-09-18 DIAGNOSIS — I2510 Atherosclerotic heart disease of native coronary artery without angina pectoris: Secondary | ICD-10-CM | POA: Diagnosis not present

## 2015-09-18 DIAGNOSIS — M4806 Spinal stenosis, lumbar region: Secondary | ICD-10-CM | POA: Diagnosis not present

## 2015-09-18 NOTE — Telephone Encounter (Signed)
Great, I'm glad they are still seeing him.

## 2015-09-18 NOTE — Telephone Encounter (Signed)
Kim from [facility] called for verbal order to re-certify patient for skilled nursing. Verbal order given per standing protocol at [facility].   Message will be sent to PCP as a [place]

## 2015-09-25 DIAGNOSIS — E1140 Type 2 diabetes mellitus with diabetic neuropathy, unspecified: Secondary | ICD-10-CM | POA: Diagnosis not present

## 2015-09-25 DIAGNOSIS — H540 Blindness, both eyes: Secondary | ICD-10-CM | POA: Diagnosis not present

## 2015-09-25 DIAGNOSIS — I2510 Atherosclerotic heart disease of native coronary artery without angina pectoris: Secondary | ICD-10-CM | POA: Diagnosis not present

## 2015-09-25 DIAGNOSIS — J449 Chronic obstructive pulmonary disease, unspecified: Secondary | ICD-10-CM | POA: Diagnosis not present

## 2015-09-25 DIAGNOSIS — E1165 Type 2 diabetes mellitus with hyperglycemia: Secondary | ICD-10-CM | POA: Diagnosis not present

## 2015-09-25 DIAGNOSIS — I10 Essential (primary) hypertension: Secondary | ICD-10-CM | POA: Diagnosis not present

## 2015-09-25 DIAGNOSIS — M4806 Spinal stenosis, lumbar region: Secondary | ICD-10-CM | POA: Diagnosis not present

## 2015-09-25 DIAGNOSIS — Z794 Long term (current) use of insulin: Secondary | ICD-10-CM | POA: Diagnosis not present

## 2015-10-03 DIAGNOSIS — I10 Essential (primary) hypertension: Secondary | ICD-10-CM | POA: Diagnosis not present

## 2015-10-03 DIAGNOSIS — E1140 Type 2 diabetes mellitus with diabetic neuropathy, unspecified: Secondary | ICD-10-CM | POA: Diagnosis not present

## 2015-10-03 DIAGNOSIS — E119 Type 2 diabetes mellitus without complications: Secondary | ICD-10-CM | POA: Diagnosis not present

## 2015-10-03 DIAGNOSIS — I209 Angina pectoris, unspecified: Secondary | ICD-10-CM | POA: Diagnosis not present

## 2015-10-03 DIAGNOSIS — I2510 Atherosclerotic heart disease of native coronary artery without angina pectoris: Secondary | ICD-10-CM | POA: Diagnosis not present

## 2015-10-04 ENCOUNTER — Encounter: Payer: Self-pay | Admitting: Internal Medicine

## 2015-10-04 ENCOUNTER — Ambulatory Visit (INDEPENDENT_AMBULATORY_CARE_PROVIDER_SITE_OTHER): Payer: Medicare Other | Admitting: Internal Medicine

## 2015-10-04 VITALS — BP 126/78 | HR 82 | Temp 97.8°F | Ht 66.0 in | Wt 213.0 lb

## 2015-10-04 DIAGNOSIS — F32A Depression, unspecified: Secondary | ICD-10-CM

## 2015-10-04 DIAGNOSIS — IMO0002 Reserved for concepts with insufficient information to code with codable children: Secondary | ICD-10-CM

## 2015-10-04 DIAGNOSIS — M4806 Spinal stenosis, lumbar region: Secondary | ICD-10-CM | POA: Diagnosis not present

## 2015-10-04 DIAGNOSIS — Z794 Long term (current) use of insulin: Secondary | ICD-10-CM | POA: Diagnosis not present

## 2015-10-04 DIAGNOSIS — F329 Major depressive disorder, single episode, unspecified: Secondary | ICD-10-CM | POA: Diagnosis not present

## 2015-10-04 DIAGNOSIS — Z716 Tobacco abuse counseling: Secondary | ICD-10-CM

## 2015-10-04 DIAGNOSIS — M159 Polyosteoarthritis, unspecified: Secondary | ICD-10-CM

## 2015-10-04 DIAGNOSIS — E1165 Type 2 diabetes mellitus with hyperglycemia: Secondary | ICD-10-CM

## 2015-10-04 DIAGNOSIS — I25118 Atherosclerotic heart disease of native coronary artery with other forms of angina pectoris: Secondary | ICD-10-CM | POA: Diagnosis not present

## 2015-10-04 DIAGNOSIS — Z720 Tobacco use: Secondary | ICD-10-CM

## 2015-10-04 DIAGNOSIS — E1142 Type 2 diabetes mellitus with diabetic polyneuropathy: Secondary | ICD-10-CM

## 2015-10-04 DIAGNOSIS — E1140 Type 2 diabetes mellitus with diabetic neuropathy, unspecified: Secondary | ICD-10-CM | POA: Diagnosis not present

## 2015-10-04 DIAGNOSIS — I10 Essential (primary) hypertension: Secondary | ICD-10-CM | POA: Diagnosis not present

## 2015-10-04 DIAGNOSIS — J449 Chronic obstructive pulmonary disease, unspecified: Secondary | ICD-10-CM | POA: Diagnosis not present

## 2015-10-04 DIAGNOSIS — I2510 Atherosclerotic heart disease of native coronary artery without angina pectoris: Secondary | ICD-10-CM | POA: Diagnosis not present

## 2015-10-04 DIAGNOSIS — H540 Blindness, both eyes: Secondary | ICD-10-CM | POA: Diagnosis not present

## 2015-10-04 MED ORDER — OXYCODONE HCL 5 MG PO TABS: ORAL_TABLET | ORAL | Status: DC

## 2015-10-04 NOTE — Patient Instructions (Addendum)
Please call back with name of new pill 1/2 tablet.  Do not smoke when using the nicotine patches.

## 2015-10-04 NOTE — Progress Notes (Signed)
Patient ID: Eddie Graves, male   DOB: [birth year], 81 y.o.   MRN: [id number]   Location: [facility] Provider: Gwenith Spitz. Renato Gails, D.O., C.M.D.   Goals of Care: Advanced Directive information Does patient have an advance directive?: No, Would patient like information on creating an advanced directive?: Yes - Educational materials given (Given at previous visit )  Chief Complaint  Patient presents with  . Medical Management of Chronic Issues    6 week follow-up on DM and HTN, patient here alone, patient's daughter dropped him off.     HPI: Patient is a 81 y.o. male seen in the office today for medical mgt of chronic diseases including his diabetes and hypertension.  He's here alone today b/c his son is at dialysis and his daughter dropped him off here.    Doing well past few weeks, he says.    Worries a lot.  Says that's been chronic for him.  Still taking zoloft for his spirits.  Has had several friends die recently.    Dr. Sharyn Lull gave him nitroglycerin patches for his chest pains.    [facility]  Also needs to eat proper meals and eat like he should.    Getting his insulin.  Sometimes he can do the testing and sometimes, he tries and tries and he can't get the blood.  Could not check two days ago b/c he was alone and couldn't get the sample himself.    4th finger on right hand is painful when he straightens it out and other fingers are getting affected.  Twice a day, he soaks his hands in hot water and epsom salts and rubs them with alcohol.    Weight is stable.    He notes his daughter will not help him.  She will occasionally drive him, but doesn't otherwise take care of herself well, he says.  Has a family.    Review of Systems:  Review of Systems  Constitutional: Negative for fever and chills.  Respiratory: Positive for shortness of breath. Negative for wheezing.   Cardiovascular: Negative for chest pain.       No episodes  since ntg patch started  Gastrointestinal: Negative for abdominal pain, constipation, blood in stool and melena.  Genitourinary: Negative for dysuria.  Musculoskeletal: Positive for back pain and joint pain. Negative for falls.  Skin: Negative for rash.  Neurological: Negative for dizziness and loss of consciousness.  Psychiatric/Behavioral: Positive for depression and memory loss. The patient is nervous/anxious.     Past Medical History  Diagnosis Date  . DM (diabetes mellitus) type II controlled peripheral vascular disorder (HCC)   . Essential hypertension, benign   . Blind   . Headache(784.0)   . Sebaceous cyst   . Chronic airway obstruction, not elsewhere classified   . Cough   . Spinal stenosis, lumbar region, with neurogenic claudication   . Coronary atherosclerosis of native coronary artery   . Vitamin D deficiency   . Rash and other nonspecific skin eruption   . Other and unspecified hyperlipidemia   . Anxiety state, unspecified   . Depressive disorder, not elsewhere classified   . Legal blindness, as defined in [place]   . Reflux esophagitis   . Unspecified constipation   . Pain in joint, site unspecified   . Memory loss   . Palpitations   . Type II diabetes mellitus with neurological manifestations, uncontrolled (HCC)   . H/O hiatal hernia  Past Surgical History  Procedure Laterality Date  . Coronary angioplasty with stent placement    . Hernia repair    . Eye surgery      Allergies  Allergen Reactions  . Flexeril [Cyclobenzaprine] Other (See Comments)    delirium      Medication List       This list is accurate as of: 10/04/15  8:49 AM.  Always use your most recent med list.               albuterol (2.5 MG/3ML) 0.083% nebulizer solution  Commonly known as:  PROVENTIL  Take 3 mLs (2.5 mg total) by nebulization every 6 (six) hours as needed for wheezing or shortness of breath.     albuterol 108 (90 BASE) MCG/ACT inhaler  Commonly known as:   PROVENTIL HFA;VENTOLIN HFA  Inhale 2 puffs into the lungs every 2 (two) hours as needed for wheezing or shortness of breath.     amLODipine 2.5 MG tablet  Commonly known as:  NORVASC  Take one tablet by mouth once daily for blood pressure     aspirin EC 81 MG tablet  Take 1 tablet (81 mg total) by mouth daily.     CRESTOR 20 MG tablet  Generic drug:  rosuvastatin  Take 20 mg by mouth daily. For cholesterol     esomeprazole 40 MG capsule  Commonly known as:  NEXIUM  Take one capsule by mouth once daily before breakfast for stomach     GARLIC PO  Take 75 mg by mouth daily.     glucose blood test strip  Use to check blood sugar twice daily. Dx: E11.41     Insulin Lispro Prot & Lispro (75-25) 100 UNIT/ML Kwikpen  Commonly known as:  [name] 75/25 KWIKPEN  Inject 36 units into the skin 2 times daily to control blood sugar.     levETIRAcetam 500 MG 24 hr tablet  Commonly known as:  KEPPRA XR  Take 2 tablets (1,000 mg total) by mouth daily. For seizures     linagliptin 5 MG Tabs tablet  Commonly known as:  TRADJENTA  Take 1 tablet (5 mg total) by mouth daily. For diabetes     lisinopril 10 MG tablet  Commonly known as:  PRINIVIL,ZESTRIL  Take one tablet by mouth once daily for blood pressure     metoprolol succinate 100 MG 24 hr tablet  Commonly known as:  TOPROL-XL  Take one tablet by mouth once daily for blood pressure     multivitamin with minerals Tabs tablet  Take 1 tablet by mouth daily.     nitroGLYCERIN 0.4 mg/hr patch  Commonly known as:  NITRODUR - Dosed in mg/24 hr  Place 1 patch onto the skin daily. For chest pain     Omega 3 1200 MG Caps  Take 2,400 mg by mouth daily. For cholesterol     oxyCODONE 5 MG immediate release tablet  Commonly known as:  ROXICODONE  Take one tablet by mouth every 6 hours as needed for pain     sertraline 50 MG tablet  Commonly known as:  ZOLOFT  Take 1 tablet (50 mg total) by mouth daily.     Vitamin D 2000 UNITS Caps    Take 1 capsule by mouth daily. For vitamin D repletion        Health Maintenance  Topic Date Due  . ZOSTAVAX  06/02/1993  . FOOT EXAM  04/27/2015  . OPHTHALMOLOGY EXAM  09/07/2015  .  HEMOGLOBIN A1C  02/27/2016  . INFLUENZA VACCINE  07/07/2016  . TETANUS/TDAP  12/30/2021  . PNA vac Low Risk Adult  Completed    Physical Exam: Filed Vitals:   10/04/15 0829  BP: 126/78  Pulse: 82  Temp: 97.8 F (36.6 C)  TempSrc: Oral  Height: 5\' 6"  (1.676 m)  Weight: 213 lb (96.616 kg)  SpO2: 95%   Body mass index is 34.4 kg/(m^2). Physical Exam  Constitutional: He is oriented to person, place, and time. He appears well-developed and well-nourished. No distress.  Eyes:  blind  Cardiovascular: Normal rate, regular rhythm, normal heart sounds and intact distal pulses.   Pulmonary/Chest: Effort normal and breath sounds normal. No respiratory distress. He has no wheezes.  Abdominal: Soft. Bowel sounds are normal. He exhibits no distension and no mass. There is no tenderness.  Musculoskeletal: Normal range of motion. He exhibits tenderness.  Of fingers with some deformity present; uses walking stick  Neurological: He is alert and oriented to person, place, and time.  Skin: Skin is warm and dry.  Psychiatric: He has a normal mood and affect.    Labs reviewed: Basic Metabolic Panel:  Recent Labs  0534 08/16/15 1021 08/30/15 1006  NA 140 138 138  K 4.5 4.6 4.1  CL 107 97 99  CO2 27 23 22   GLUCOSE 121* 151* 159*  BUN 12 14 21   CREATININE 1.30* 1.39* 1.46*  CALCIUM 9.3 9.8 9.6   Liver Function Tests:  Recent Labs  05/19/15 1201 08/16/15 1021 08/30/15 1006  AST 30 22 26   ALT 15* 13 18  ALKPHOS 81 110 104  BILITOT 0.7 0.3 0.3  PROT 6.8 7.2 7.2  ALBUMIN 3.4* 4.2 4.2   No results for input(s): LIPASE, AMYLASE in the last 8760 hours. No results for input(s): AMMONIA in the last 8760 hours. CBC:  Recent Labs  10/12/14 0358  11/26/14 1156 03/19/15 0905  05/19/15 1201 08/30/15 1006  WBC 9.6  < > 5.7 6.8 7.9 7.8  NEUTROABS  --   --  2.9 3.8  --  4.0  HGB 13.6  --  12.2* 12.2* 12.3*  --   HCT 39.6  --  37.5 37.3* 37.6* 39.8  MCV 87.2  --  91 90 88.7  --   PLT 208  --  198  --  201  --   < > = values in this interval not displayed. Lipid Panel:  Recent Labs  11/26/14 1156 03/19/15 0905 08/30/15 1006  CHOL 116 124 107  HDL 39* 48 35*  LDLCALC 49 52 52  TRIG 138 122 99  CHOLHDL 3.0 2.6 3.1   Lab Results  Component Value Date   HGBA1C 8.7* 08/30/2015    Assessment/Plan 1. Depression -seems to be fairly well controlled considering his personal situation and lack of social support -cont zoloft  2. Uncontrolled type 2 diabetes mellitus with diabetic polyneuropathy, with long-term current use of insulin (HCC) -will never be controlled if he does not get help to check his glucose and take his insulin -has improved with nursing coming out to the home, but apparently has last visit today so we'll be back to square one b/c he cannot always get enough blood on the test strip to check his glucose -son is only there part time, going through a divorce and has two kids and daughter just drives him around, but does not otherwise help -continue humalog mix bid (for cost and convenience) and tradjenta and ace and statin  3. Tobacco abuse -ongoing, wants to quit, but each time he does, he goes back to it -will try to refer to smoking cessation program, but I doubt he will be able to get transportation -ideally, should have scat and I think I completed it for him before - Ambulatory referral to Smoking Cessation Program  4. Tobacco abuse counseling - he was counseled today again about benefits of quitting and he knows he feels better off of cigarettes, but keeps picking them back up - Ambulatory referral to Smoking Cessation Program  5. Coronary artery disease involving native coronary artery with other forms of angina pectoris (HCC) -cont  ntg patches per Dr. Sharyn Lull, metoprolol succinate, lisinopril, , crestor, norvasc and monitor -smoking cessation  6. Generalized osteoarthritis of hand -just saw Dr. Montez Morita who started him on 1/2 of some pill, but pt doesn't know what--advised to call back with the name of it b/c it's probably another nsaid which he can't take with his diabetic renal disease, use of ace and inadequate hydration with water  Labs/tests ordered:   Orders Placed This Encounter  Procedures  . Ambulatory referral to Smoking Cessation Program    Referral Priority:  Routine    Referral Type:  Consultation    Referral Reason:  Specialty Services Required    Requested Specialty:  Addiction Medicine    Number of Visits Requested:  1    Next appt:  6 wks for med mgt--11/14/2015 Possible scat bus form, who can help with cbgs?    Chandani Rogowski L. Demian Maisel, D.O. Geriatrics [facility] [address], [place] [address] Cell Phone (Mon-Fri 8am-5pm):  [phone] On Call:  [phone] & follow prompts after 5pm & weekends Office Phone:  [phone] Office Fax:  [phone]

## 2015-10-09 DIAGNOSIS — E1140 Type 2 diabetes mellitus with diabetic neuropathy, unspecified: Secondary | ICD-10-CM | POA: Diagnosis not present

## 2015-10-09 DIAGNOSIS — Z794 Long term (current) use of insulin: Secondary | ICD-10-CM | POA: Diagnosis not present

## 2015-10-09 DIAGNOSIS — J449 Chronic obstructive pulmonary disease, unspecified: Secondary | ICD-10-CM | POA: Diagnosis not present

## 2015-10-09 DIAGNOSIS — I2510 Atherosclerotic heart disease of native coronary artery without angina pectoris: Secondary | ICD-10-CM | POA: Diagnosis not present

## 2015-10-09 DIAGNOSIS — E1165 Type 2 diabetes mellitus with hyperglycemia: Secondary | ICD-10-CM | POA: Diagnosis not present

## 2015-10-09 DIAGNOSIS — I10 Essential (primary) hypertension: Secondary | ICD-10-CM | POA: Diagnosis not present

## 2015-10-09 DIAGNOSIS — M4806 Spinal stenosis, lumbar region: Secondary | ICD-10-CM | POA: Diagnosis not present

## 2015-10-09 DIAGNOSIS — H540 Blindness, both eyes: Secondary | ICD-10-CM | POA: Diagnosis not present

## 2015-10-14 DIAGNOSIS — J449 Chronic obstructive pulmonary disease, unspecified: Secondary | ICD-10-CM | POA: Diagnosis not present

## 2015-10-14 DIAGNOSIS — E1140 Type 2 diabetes mellitus with diabetic neuropathy, unspecified: Secondary | ICD-10-CM | POA: Diagnosis not present

## 2015-10-14 DIAGNOSIS — I2510 Atherosclerotic heart disease of native coronary artery without angina pectoris: Secondary | ICD-10-CM | POA: Diagnosis not present

## 2015-10-14 DIAGNOSIS — I10 Essential (primary) hypertension: Secondary | ICD-10-CM | POA: Diagnosis not present

## 2015-10-14 DIAGNOSIS — M4806 Spinal stenosis, lumbar region: Secondary | ICD-10-CM | POA: Diagnosis not present

## 2015-10-14 DIAGNOSIS — E1165 Type 2 diabetes mellitus with hyperglycemia: Secondary | ICD-10-CM | POA: Diagnosis not present

## 2015-10-14 DIAGNOSIS — Z794 Long term (current) use of insulin: Secondary | ICD-10-CM | POA: Diagnosis not present

## 2015-10-14 DIAGNOSIS — H540 Blindness, both eyes: Secondary | ICD-10-CM | POA: Diagnosis not present

## 2015-10-22 DIAGNOSIS — I2510 Atherosclerotic heart disease of native coronary artery without angina pectoris: Secondary | ICD-10-CM | POA: Diagnosis not present

## 2015-10-22 DIAGNOSIS — E1165 Type 2 diabetes mellitus with hyperglycemia: Secondary | ICD-10-CM | POA: Diagnosis not present

## 2015-10-22 DIAGNOSIS — M4806 Spinal stenosis, lumbar region: Secondary | ICD-10-CM | POA: Diagnosis not present

## 2015-10-22 DIAGNOSIS — J449 Chronic obstructive pulmonary disease, unspecified: Secondary | ICD-10-CM | POA: Diagnosis not present

## 2015-10-22 DIAGNOSIS — I10 Essential (primary) hypertension: Secondary | ICD-10-CM | POA: Diagnosis not present

## 2015-10-22 DIAGNOSIS — Z794 Long term (current) use of insulin: Secondary | ICD-10-CM | POA: Diagnosis not present

## 2015-10-22 DIAGNOSIS — E1140 Type 2 diabetes mellitus with diabetic neuropathy, unspecified: Secondary | ICD-10-CM | POA: Diagnosis not present

## 2015-10-22 DIAGNOSIS — H540 Blindness, both eyes: Secondary | ICD-10-CM | POA: Diagnosis not present

## 2015-10-29 DIAGNOSIS — Z794 Long term (current) use of insulin: Secondary | ICD-10-CM | POA: Diagnosis not present

## 2015-10-29 DIAGNOSIS — I10 Essential (primary) hypertension: Secondary | ICD-10-CM | POA: Diagnosis not present

## 2015-10-29 DIAGNOSIS — H540 Blindness, both eyes: Secondary | ICD-10-CM | POA: Diagnosis not present

## 2015-10-29 DIAGNOSIS — I2510 Atherosclerotic heart disease of native coronary artery without angina pectoris: Secondary | ICD-10-CM | POA: Diagnosis not present

## 2015-10-29 DIAGNOSIS — J449 Chronic obstructive pulmonary disease, unspecified: Secondary | ICD-10-CM | POA: Diagnosis not present

## 2015-10-29 DIAGNOSIS — E1165 Type 2 diabetes mellitus with hyperglycemia: Secondary | ICD-10-CM | POA: Diagnosis not present

## 2015-10-29 DIAGNOSIS — M4806 Spinal stenosis, lumbar region: Secondary | ICD-10-CM | POA: Diagnosis not present

## 2015-10-29 DIAGNOSIS — E1140 Type 2 diabetes mellitus with diabetic neuropathy, unspecified: Secondary | ICD-10-CM | POA: Diagnosis not present

## 2015-11-05 DIAGNOSIS — H540 Blindness, both eyes: Secondary | ICD-10-CM | POA: Diagnosis not present

## 2015-11-05 DIAGNOSIS — I2510 Atherosclerotic heart disease of native coronary artery without angina pectoris: Secondary | ICD-10-CM | POA: Diagnosis not present

## 2015-11-05 DIAGNOSIS — J449 Chronic obstructive pulmonary disease, unspecified: Secondary | ICD-10-CM | POA: Diagnosis not present

## 2015-11-05 DIAGNOSIS — E1165 Type 2 diabetes mellitus with hyperglycemia: Secondary | ICD-10-CM | POA: Diagnosis not present

## 2015-11-05 DIAGNOSIS — E1140 Type 2 diabetes mellitus with diabetic neuropathy, unspecified: Secondary | ICD-10-CM | POA: Diagnosis not present

## 2015-11-05 DIAGNOSIS — M4806 Spinal stenosis, lumbar region: Secondary | ICD-10-CM | POA: Diagnosis not present

## 2015-11-05 DIAGNOSIS — Z794 Long term (current) use of insulin: Secondary | ICD-10-CM | POA: Diagnosis not present

## 2015-11-05 DIAGNOSIS — I10 Essential (primary) hypertension: Secondary | ICD-10-CM | POA: Diagnosis not present

## 2015-11-06 ENCOUNTER — Encounter (HOSPITAL_COMMUNITY): Payer: Self-pay | Admitting: Cardiology

## 2015-11-06 ENCOUNTER — Emergency Department (HOSPITAL_COMMUNITY)
Admission: EM | Admit: 2015-11-06 | Discharge: 2015-11-06 | Disposition: A | Discharge status: Home / Self Care | Payer: Medicare Other | Attending: Emergency Medicine | Admitting: Emergency Medicine

## 2015-11-06 ENCOUNTER — Emergency Department (HOSPITAL_COMMUNITY): Payer: Medicare Other

## 2015-11-06 DIAGNOSIS — K219 Gastro-esophageal reflux disease without esophagitis: Secondary | ICD-10-CM | POA: Diagnosis not present

## 2015-11-06 DIAGNOSIS — F419 Anxiety disorder, unspecified: Secondary | ICD-10-CM | POA: Insufficient documentation

## 2015-11-06 DIAGNOSIS — E1142 Type 2 diabetes mellitus with diabetic polyneuropathy: Secondary | ICD-10-CM | POA: Diagnosis present

## 2015-11-06 DIAGNOSIS — R079 Chest pain, unspecified: Secondary | ICD-10-CM

## 2015-11-06 DIAGNOSIS — I10 Essential (primary) hypertension: Secondary | ICD-10-CM | POA: Insufficient documentation

## 2015-11-06 DIAGNOSIS — M48062 Spinal stenosis, lumbar region with neurogenic claudication: Secondary | ICD-10-CM | POA: Diagnosis present

## 2015-11-06 DIAGNOSIS — I5032 Chronic diastolic (congestive) heart failure: Secondary | ICD-10-CM

## 2015-11-06 DIAGNOSIS — R29818 Other symptoms and signs involving the nervous system: Secondary | ICD-10-CM | POA: Diagnosis not present

## 2015-11-06 DIAGNOSIS — I5030 Unspecified diastolic (congestive) heart failure: Secondary | ICD-10-CM | POA: Diagnosis present

## 2015-11-06 DIAGNOSIS — E559 Vitamin D deficiency, unspecified: Secondary | ICD-10-CM | POA: Insufficient documentation

## 2015-11-06 DIAGNOSIS — R0602 Shortness of breath: Secondary | ICD-10-CM | POA: Diagnosis not present

## 2015-11-06 DIAGNOSIS — F17210 Nicotine dependence, cigarettes, uncomplicated: Secondary | ICD-10-CM | POA: Insufficient documentation

## 2015-11-06 DIAGNOSIS — R531 Weakness: Secondary | ICD-10-CM | POA: Diagnosis present

## 2015-11-06 DIAGNOSIS — Z872 Personal history of diseases of the skin and subcutaneous tissue: Secondary | ICD-10-CM | POA: Insufficient documentation

## 2015-11-06 DIAGNOSIS — E785 Hyperlipidemia, unspecified: Secondary | ICD-10-CM | POA: Insufficient documentation

## 2015-11-06 DIAGNOSIS — F329 Major depressive disorder, single episode, unspecified: Secondary | ICD-10-CM | POA: Diagnosis not present

## 2015-11-06 DIAGNOSIS — R200 Anesthesia of skin: Secondary | ICD-10-CM | POA: Insufficient documentation

## 2015-11-06 DIAGNOSIS — H547 Unspecified visual loss: Secondary | ICD-10-CM | POA: Diagnosis present

## 2015-11-06 DIAGNOSIS — J449 Chronic obstructive pulmonary disease, unspecified: Secondary | ICD-10-CM | POA: Diagnosis not present

## 2015-11-06 DIAGNOSIS — E119 Type 2 diabetes mellitus without complications: Secondary | ICD-10-CM | POA: Insufficient documentation

## 2015-11-06 DIAGNOSIS — R0789 Other chest pain: Secondary | ICD-10-CM | POA: Insufficient documentation

## 2015-11-06 DIAGNOSIS — I2510 Atherosclerotic heart disease of native coronary artery without angina pectoris: Secondary | ICD-10-CM | POA: Insufficient documentation

## 2015-11-06 DIAGNOSIS — N183 Chronic kidney disease, stage 3 unspecified: Secondary | ICD-10-CM | POA: Diagnosis present

## 2015-11-06 DIAGNOSIS — Z8739 Personal history of other diseases of the musculoskeletal system and connective tissue: Secondary | ICD-10-CM | POA: Diagnosis not present

## 2015-11-06 DIAGNOSIS — Z794 Long term (current) use of insulin: Secondary | ICD-10-CM | POA: Insufficient documentation

## 2015-11-06 DIAGNOSIS — R208 Other disturbances of skin sensation: Secondary | ICD-10-CM

## 2015-11-06 DIAGNOSIS — H540 Blindness, both eyes: Secondary | ICD-10-CM | POA: Diagnosis not present

## 2015-11-06 DIAGNOSIS — R51 Headache: Secondary | ICD-10-CM | POA: Diagnosis not present

## 2015-11-06 DIAGNOSIS — Z7982 Long term (current) use of aspirin: Secondary | ICD-10-CM | POA: Insufficient documentation

## 2015-11-06 DIAGNOSIS — Z79899 Other long term (current) drug therapy: Secondary | ICD-10-CM | POA: Diagnosis not present

## 2015-11-06 DIAGNOSIS — J209 Acute bronchitis, unspecified: Secondary | ICD-10-CM

## 2015-11-06 DIAGNOSIS — J440 Chronic obstructive pulmonary disease with acute lower respiratory infection: Secondary | ICD-10-CM

## 2015-11-06 LAB — BASIC METABOLIC PANEL
Anion gap: 7 (ref 5–15)
BUN: 12 mg/dL (ref 6–20)
CHLORIDE: 103 mmol/L (ref 101–111)
CO2: 27 mmol/L (ref 22–32)
CREATININE: 1.28 mg/dL — AB (ref 0.61–1.24)
Calcium: 9.5 mg/dL (ref 8.9–10.3)
GFR calc non Af Amer: 50 mL/min — ABNORMAL LOW (ref >60)
GFR, EST AFRICAN AMERICAN: 58 mL/min — AB (ref >60)
GLUCOSE: 144 mg/dL — AB (ref 65–99)
Potassium: 4.6 mmol/L (ref 3.5–5.1)
Sodium: 137 mmol/L (ref 135–145)

## 2015-11-06 LAB — CBC
HCT: 41.2 % (ref 39.0–52.0)
HEMOGLOBIN: 13.2 g/dL (ref 13.0–17.0)
MCH: 27.8 pg (ref 26.0–34.0)
MCHC: 32 g/dL (ref 30.0–36.0)
MCV: 86.9 fL (ref 78.0–100.0)
PLATELETS: 189 10*3/uL (ref 150–400)
RBC: 4.74 MIL/uL (ref 4.22–5.81)
RDW: 14.5 % (ref 11.5–15.5)
WBC: 6.7 10*3/uL (ref 4.0–10.5)

## 2015-11-06 LAB — I-STAT TROPONIN, ED
Troponin i, poc: 0 ng/mL (ref 0.00–0.08)
Troponin i, poc: 0.01 ng/mL (ref 0.00–0.08)

## 2015-11-06 MED ORDER — AZITHROMYCIN 250 MG PO TABS
500.0000 mg | ORAL_TABLET | Freq: Every day | ORAL | Status: DC
  Administered 2015-11-06: 500 mg via ORAL
  Filled 2015-11-06: qty 2

## 2015-11-06 MED ORDER — FENTANYL CITRATE (PF) 100 MCG/2ML IJ SOLN: 50.0000 ug | INTRAMUSCULAR | Status: DC | PRN

## 2015-11-06 MED ORDER — PREDNISONE 20 MG PO TABS
40.0000 mg | ORAL_TABLET | Freq: Every day | ORAL | Status: DC
  Administered 2015-11-06: 40 mg via ORAL
  Filled 2015-11-06: qty 2

## 2015-11-06 MED ORDER — IPRATROPIUM-ALBUTEROL 0.5-2.5 (3) MG/3ML IN SOLN
3.0000 mL | Freq: Once | RESPIRATORY_TRACT | Status: AC
  Administered 2015-11-06: 3 mL via RESPIRATORY_TRACT
  Filled 2015-11-06: qty 3

## 2015-11-06 MED ORDER — ASPIRIN 81 MG PO CHEW: 324.0000 mg | CHEWABLE_TABLET | Freq: Once | ORAL | Status: DC

## 2015-11-06 MED ORDER — ALBUTEROL SULFATE HFA 108 (90 BASE) MCG/ACT IN AERS
1.0000 | INHALATION_SPRAY | RESPIRATORY_TRACT | Status: DC | PRN
  Administered 2015-11-06: 2 via RESPIRATORY_TRACT
  Filled 2015-11-06: qty 6.7

## 2015-11-06 NOTE — ED Notes (Signed)
Pt to ct scan.

## 2015-11-06 NOTE — ED Notes (Signed)
Brought patient back to room via wheelchair; myself and Lillia Abed, RN undressed patient, placed in gown, on monitor, continuous pulse oximetry and blood pressure cuff

## 2015-11-06 NOTE — Care Management Note (Signed)
Case Management Note  Patient Details  Name: Eddie Graves MRN: [id number] Date of Birth: [birth year]  Subjective/Objective:                  81 y.o. male, with type 2 diabetes, blindness, memory loss, hypertension, spinal stenosis, who was at home alone. He presents to the emergency department with left facial tingling and chest pain.//Home alone, children nearby.   Action/Plan: Follow for disposition needs.   Expected Discharge Date:            11/06/15      Expected Discharge Plan:     In-House Referral:     Discharge planning Services     Post Acute Care Choice:    Choice offered to:     DME Arranged:    DME Agency:     HH Arranged:    HH Agency:     Status of Service:     Medicare Important Message Given:    Date Medicare IM Given:    Medicare IM give by:    Date Additional Medicare IM Given:    Additional Medicare Important Message give by:     If discussed at Long Length of Stay Meetings, dates discussed:    Additional Comments: Pt currently active with [facility] services.  Resumption of care requested. [name] of [facility] notified.  No DME needs identified at this time.  [facility] consulted to review for additional home services.  Oletta Cohn, RN 11/06/2015, 2:12 PM

## 2015-11-06 NOTE — ED Notes (Signed)
States when "I woke up I was laying on the left side of my face, and I could not raise my head up, right arm tingling, left arm hurts to lift. Feet feel tingly also. "

## 2015-11-06 NOTE — ED Notes (Signed)
Reports he was weak this morning when he woke up and had trouble getting out of bed. Also reports chest pain that started a couple of days ago. Also having some sOB.

## 2015-11-06 NOTE — ED Provider Notes (Signed)
CSN: [id number]     Arrival date & time 11/06/15  6378 History   First MD Initiated Contact with Patient 11/06/15 0813     Chief Complaint  Patient presents with  . Chest Pain  . Weakness     (Consider location/radiation/quality/duration/timing/severity/associated sxs/prior Treatment) HPI Comments: Patient with significant history of COPD, blindness, high blood pressure, subarachnoid hemorrhage, kidney disease presents with 2 complaints. Patient has had intermittent chest pain usually goes away after nitroglycerin or brief time.  Today patient had overall similar left-sided chest pain mild left shoulder involvement that went away after approximately one hour lasted a little longer than normal. Patient had left-sided facial numbness and also resolved on its own after approximately 1-2 hours. Patient denies any current weakness or numbness in the arms legs or face. No stroke history known. No head injury. No headache. This possibly feels similar to his subarachnoid hemorrhage however he has no headache and currently no neurologic symptoms. Patient does take baby aspirin daily. Patient does get chest pain with exertion however he also has chest pain forof dissociation or reason. Patient has CAD history.  Patient is a 81 y.o. male presenting with chest pain and weakness. The history is provided by the patient.  Chest Pain Associated symptoms: numbness and weakness (general)   Associated symptoms: no abdominal pain, no back pain, no fever, no headache, no shortness of breath and not vomiting   Weakness Associated symptoms include chest pain. Pertinent negatives include no abdominal pain, no headaches and no shortness of breath.    Past Medical History  Diagnosis Date  . DM (diabetes mellitus) type II controlled peripheral vascular disorder (HCC)   . Essential hypertension, benign   . Blind   . Headache(784.0)   . Sebaceous cyst   . Chronic airway obstruction, not elsewhere classified   .  Cough   . Spinal stenosis, lumbar region, with neurogenic claudication   . Coronary atherosclerosis of native coronary artery   . Vitamin D deficiency   . Rash and other nonspecific skin eruption   . Other and unspecified hyperlipidemia   . Anxiety state, unspecified   . Depressive disorder, not elsewhere classified   . Legal blindness, as defined in [place]   . Reflux esophagitis   . Unspecified constipation   . Pain in joint, site unspecified   . Memory loss   . Palpitations   . Type II diabetes mellitus with neurological manifestations, uncontrolled (HCC)   . H/O hiatal hernia    Past Surgical History  Procedure Laterality Date  . Coronary angioplasty with stent placement    . Hernia repair    . Eye surgery     Family History  Problem Relation Age of Onset  . Asthma Mother   . Diabetes Son   . Cancer Daughter   . Diabetes Son    Social History  Substance Use Topics  . Smoking status: Light Tobacco Smoker -- 50 years    Types: Cigarettes    Last Attempt to Quit: 03/07/2013  . Smokeless tobacco: Never Used     Comment: Patient say he smokes 4 cigs. per day  . Alcohol Use: No    Review of Systems  Constitutional: Negative for fever and chills.  HENT: Negative for congestion.   Eyes: Negative for visual disturbance.  Respiratory: Negative for shortness of breath.   Cardiovascular: Positive for chest pain.  Gastrointestinal: Negative for vomiting and abdominal pain.  Genitourinary: Negative for dysuria and flank pain.  Musculoskeletal: Positive for  arthralgias. Negative for back pain, neck pain and neck stiffness.  Skin: Negative for rash.  Neurological: Positive for weakness (general) and numbness. Negative for syncope, light-headedness and headaches.      Allergies  Flexeril  Home Medications   Prior to Admission medications   Medication Sig Start Date End Date Taking? Authorizing Provider  albuterol (PROVENTIL HFA;VENTOLIN HFA) 108 (90 BASE) MCG/ACT inhaler  Inhale 2 puffs into the lungs every 2 (two) hours as needed for wheezing or shortness of breath. 08/16/15   Tiffany L Reed, DO  albuterol (PROVENTIL) (2.5 MG/3ML) 0.083% nebulizer solution Take 3 mLs (2.5 mg total) by nebulization every 6 (six) hours as needed for wheezing or shortness of breath. 05/30/15   Tiffany L Reed, DO  amLODipine (NORVASC) 2.5 MG tablet Take one tablet by mouth once daily for blood pressure 06/06/15   Tiffany L Reed, DO  aspirin EC 81 MG tablet Take 1 tablet (81 mg total) by mouth daily. 10/18/14   Tiffany L Reed, DO  Cholecalciferol (VITAMIN D) 2000 UNITS CAPS Take 1 capsule by mouth daily. For vitamin D repletion    Historical Provider, MD  CRESTOR 20 MG tablet Take 20 mg by mouth daily. For cholesterol 08/13/14   Historical Provider, MD  esomeprazole (NEXIUM) 40 MG capsule Take one capsule by mouth once daily before breakfast for stomach 06/04/15   Kimber Relic, MD  GARLIC PO Take 75 mg by mouth daily.     Historical Provider, MD  glucose blood test strip Use to check blood sugar twice daily. Dx: E11.41 05/21/15   Tiffany L Reed, DO  Insulin Lispro Prot & Lispro ([name] 75/25 KWIKPEN) (75-25) 100 UNIT/ML Kwikpen Inject 36 units into the skin 2 times daily to control blood sugar. 06/04/15   Tiffany L Reed, DO  levETIRAcetam (KEPPRA XR) 500 MG 24 hr tablet Take 2 tablets (1,000 mg total) by mouth daily. For seizures 09/12/15   Tiffany L Reed, DO  linagliptin (TRADJENTA) 5 MG TABS tablet Take 1 tablet (5 mg total) by mouth daily. For diabetes 07/15/15   Kermit Balo, DO  lisinopril (PRINIVIL,ZESTRIL) 10 MG tablet Take one tablet by mouth once daily for blood pressure 07/09/15   Tiffany L Reed, DO  metoprolol succinate (TOPROL-XL) 100 MG 24 hr tablet Take one tablet by mouth once daily for blood pressure 06/06/15   Tiffany L Reed, DO  Multiple Vitamin (MULITIVITAMIN WITH MINERALS) TABS Take 1 tablet by mouth daily.    Historical Provider, MD  nitroGLYCERIN (NITRODUR - DOSED IN MG/24  HR) 0.4 mg/hr Place 1 patch onto the skin daily. For chest pain    Historical Provider, MD  Omega 3 1200 MG CAPS Take 2,400 mg by mouth daily. For cholesterol    Historical Provider, MD  oxyCODONE (ROXICODONE) 5 MG immediate release tablet Take one tablet by mouth every 6 hours as needed for pain 10/04/15   Tiffany L Reed, DO  sertraline (ZOLOFT) 50 MG tablet Take 1 tablet (50 mg total) by mouth daily. 09/12/15   Tiffany L Reed, DO   BP 174/98 mmHg  Pulse 81  Temp(Src) 98.9 F (37.2 C) (Oral)  Resp 18  Ht 5\' 8"  (1.727 m)  Wt 213 lb (96.616 kg)  BMI 32.39 kg/m2  SpO2 97% Physical Exam  Constitutional: He is oriented to person, place, and time. He appears well-developed and well-nourished.  HENT:  Head: Normocephalic and atraumatic.  Eyes: Conjunctivae are normal. Right eye exhibits no discharge. Left eye exhibits  no discharge.  Neck: Normal range of motion. Neck supple. No tracheal deviation present.  Cardiovascular: Normal rate and regular rhythm.   Pulmonary/Chest: Effort normal and breath sounds normal.  Abdominal: Soft. He exhibits no distension. There is no tenderness. There is no guarding.  Musculoskeletal: He exhibits no edema.  Neurological: He is alert and oriented to person, place, and time. A cranial nerve deficit is present.  Patient has blindness both eyes prosthetic eye right eye. Equal sensation to palpation of face bilateral. 5+ strength upper lower extremities no arm drift no leg drift. Very mild general fatigue/weakness on exam. Difficult finger nose due to blindness patient can touch a tip of his nose with both fingers.  Skin: Skin is warm. No rash noted.  Psychiatric: He has a normal mood and affect.  Nursing note and vitals reviewed.   ED Course  Procedures (including critical care time) Labs Review Labs Reviewed  BASIC METABOLIC PANEL - Abnormal; Notable for the following:    Glucose, Bld 144 (*)    Creatinine, Ser 1.28 (*)    GFR calc non Af Amer 50 (*)     GFR calc Af Amer 58 (*)    All other components within normal limits  CBC  I-STAT TROPOININ, ED  I-STAT TROPOININ, ED  I-STAT TROPOININ, ED    Imaging Review Dg Chest 2 View  11/06/2015  CLINICAL DATA:  Chest pain and weakness ; shortness of breath EXAM: CHEST  2 VIEW COMPARISON:  May 19, 2015. FINDINGS: There is slight scarring in the bases. There is no edema or consolidation. Heart size and pulmonary vascularity are normal. No adenopathy. No bone lesions. IMPRESSION: Mild bibasilar scarring.  No edema or consolidation. Electronically Signed   By: Bretta Bang III M.D.   On: 11/06/2015 09:17   I have personally reviewed and evaluated these images and lab results as part of my medical decision-making.   EKG Interpretation   Date/Time:  Wednesday November 06 2015 08:00:43 EST Ventricular Rate:  82 PR Interval:  224 QRS Duration: 97 QT Interval:  371 QTC Calculation: 433 R Axis:   -50 Text Interpretation:  Sinus rhythm Multiform ventricular premature  complexes Prolonged PR interval Probable left atrial enlargement LAD,  consider left anterior fascicular block Confirmed by Everlyn Farabaugh  MD, Vibhav Waddill  ([id number]) on 11/06/2015 8:15:19 AM      MDM   Final diagnoses:  Acute chest pain  Left facial numbness   Patient with cardiac history presents with intermittent chest pain, plan for serial troponins observation. Current chest pain-free holding on aspirin until CT head done.  Patient had transient left facial numbness with vascular history may be TIA or small stroke versus bleeding with history of subarachnoid hemorrhage, no headache, no neurologic complaint to this time. Plan for CT head and possible observation admission.\  CT head results reviewed no acute bleeding. Patient remains asymptomatic in the ER.Consulted [facility] hospitalist and they do not feel he meets inpatient criteria and recommends outpatient follow-up and they can consult case manager to assist. Second troponin  negative. Patient comfort with this plan.  The patients results and plan were reviewed and discussed.   Any x-rays performed were independently reviewed by myself.   Differential diagnosis were considered with the presenting HPI.  Medications  fentaNYL (SUBLIMAZE) injection 50 mcg (not administered)    Filed Vitals:   11/06/15 0918 11/06/15 1036 11/06/15 1234 11/06/15 1248  BP: 155/93 166/99  174/98  Pulse: 77 80 81 81  Temp:  TempSrc:      Resp: 12 18 26 18   Height:      Weight:      SpO2: 95% 95% 94% 97%    Final diagnoses:  Acute chest pain  Left facial numbness    Admission/ observation were discussed with the admitting physician, patient and/or family and they are comfortable with the plan.  \    , MD 11/06/15 [phone]

## 2015-11-06 NOTE — Discharge Instructions (Signed)
If you were given medicines take as directed.  If you are on coumadin or contraceptives realize their levels and effectiveness is altered by many different medicines.  If you have any reaction (rash, tongues swelling, other) to the medicines stop taking and see a physician.    If your blood pressure was elevated in the ER make sure you follow up for management with a primary doctor or return for chest pain, shortness of breath or stroke symptoms.  Please follow up as directed and return to the ER or see a physician for new or worsening symptoms.  Thank you. Filed Vitals:   11/06/15 1036 11/06/15 1234 11/06/15 1248 11/06/15 1307  BP: 166/99  174/98 161/83  Pulse: 80 81 81 78  Temp:      TempSrc:      Resp: 18 26 18 20   Height:      Weight:      SpO2: 95% 94% 97% 97%

## 2015-11-06 NOTE — Progress Notes (Signed)
CSW provided pt with community transportation and other resources.  Per RN, pt has a buddy who has agreed to assist pt with going to the [facility] and denies need for transport assist at this time.  Pt will have continued HHC services through [facility], as well as [facility] services, to which pt is agreeable. Emotional support provided to pt.

## 2015-11-06 NOTE — Consult Note (Signed)
[facility] Medical Consultation  Lorik Guo PZW:[id number] DOB: 09-17-1931 DOA: 11/06/2015 PCP: Bufford Spikes, DO   Requesting physician: Dr. Jodi Mourning Date of consultation: 11/06/2015 Reason for consultation: Consideration for admission due to left-sided facial tingling  Impression/Recommendations Principal Problem:   Neurological deficit, transient Active Problems:   Acute bronchitis with chronic obstructive pulmonary disease (COPD) (HCC)   COPD (chronic obstructive pulmonary disease) (HCC)   Essential hypertension, benign   Spinal stenosis, lumbar region, with neurogenic claudication   Diabetic peripheral neuropathy associated with type 2 diabetes mellitus (HCC)   Blind   CKD (chronic kidney disease), stage III   Chest pain at rest   Diastolic heart failure (HCC)    Left facial tingling Lasted approximately 5 minutes, and resolved. Prompted concern and a trip to the ER. Patient with full range of motion in his neck. CT head is negative for acute hemorrhage or infarct.  Patient's neuro exam shows no focal neuro deficits Believe this was likely related to sleeping position and a period of transient torticollis.   Acute Bronchitis in the setting of COPD and ongoing tobacco abuse. Cough / Wheeze.  Sputum production. Will prescribe Azithromycin, prednisone, and albuterol inhaler, complete tobacco cessation. Home Health RN for respiratory issues.   Coronary artery disease involving native coronary artery with other forms of angina pectoris (HCC) Troponin is negative x 2.  EKG shows sinus rhythm.  Nitroglycerin patches per Dr. Sharyn Lull, metoprolol succinate, lisinopril, , crestor, norvasc and monitor smoking cessation   Uncontrolled type 2 diabetes mellitus with diabetic polyneuropathy, with long-term current use of insulin [facility]) Patient reports he regularly takes his insulin, but this has been a source of difficulty for him given his blindness. Continue humalog mix  bid and tradjenta and ace and statin Have requested case management consider home health services to help with insulin and general medical management.   Candidate for PACE? Patient is active and would like to work out at [employer], but would need assistance changing clothes and finding his way around. Have asked Social Work to consider what resources may be available to him. Minimum providing transportation but also considering a more full service option such as [facility].   Blindness Legally blind. Worked for [employer] for [employer] for many years.   Chief Complaint: Left facial tingling.    HPI:  Eddie Graves is a 81 y.o. male, with type 2 diabetes, blindness, memory loss, hypertension, spinal stenosis, who was at home alone. He presents to the emergency department with left facial tingling and chest pain.  Review of Systems:  Review of Systems  Constitutional: Negative.   Eyes: Negative.   Respiratory: Positive for cough, sputum production and wheezing.   Cardiovascular: Positive for chest pain.  Gastrointestinal: Negative.   Genitourinary: Negative.   Musculoskeletal: Positive for neck pain.  Skin: Negative.   Neurological: Positive for tingling.  Endo/Heme/Allergies: Negative.   Psychiatric/Behavioral: Positive for depression.     Past Medical History  Diagnosis Date  . DM (diabetes mellitus) type II controlled peripheral vascular disorder (HCC)   . Essential hypertension, benign   . Blind   . Headache(784.0)   . Sebaceous cyst   . Chronic airway obstruction, not elsewhere classified   . Cough   . Spinal stenosis, lumbar region, with neurogenic claudication   . Coronary atherosclerosis of native coronary artery   . Vitamin D deficiency   . Rash and other nonspecific skin eruption   . Other and unspecified hyperlipidemia   .  Anxiety state, unspecified   . Depressive disorder, not elsewhere classified   . Legal blindness, as defined in [place]   . Reflux  esophagitis   . Unspecified constipation   . Pain in joint, site unspecified   . Memory loss   . Palpitations   . Type II diabetes mellitus with neurological manifestations, uncontrolled (HCC)   . H/O hiatal hernia    Past Surgical History  Procedure Laterality Date  . Coronary angioplasty with stent placement    . Hernia repair    . Eye surgery     Social History:  reports that he has been smoking Cigarettes.  He has smoked for the past 50 years. He has never used smokeless tobacco. He reports that he does not drink alcohol or use illicit drugs.  Allergies  Allergen Reactions  . Flexeril [Cyclobenzaprine] Other (See Comments)    delirium   Family History  Problem Relation Age of Onset  . Asthma Mother   . Diabetes Son   . Cancer Daughter   . Diabetes Son     Prior to Admission medications   Medication Sig Start Date End Date Taking? Authorizing Provider  albuterol (PROVENTIL HFA;VENTOLIN HFA) 108 (90 BASE) MCG/ACT inhaler Inhale 2 puffs into the lungs every 2 (two) hours as needed for wheezing or shortness of breath. 08/16/15   Tiffany L Reed, DO  albuterol (PROVENTIL) (2.5 MG/3ML) 0.083% nebulizer solution Take 3 mLs (2.5 mg total) by nebulization every 6 (six) hours as needed for wheezing or shortness of breath. 05/30/15   Tiffany L Reed, DO  amLODipine (NORVASC) 2.5 MG tablet Take one tablet by mouth once daily for blood pressure 06/06/15   Tiffany L Reed, DO  aspirin EC 81 MG tablet Take 1 tablet (81 mg total) by mouth daily. 10/18/14   Tiffany L Reed, DO  Cholecalciferol (VITAMIN D) 2000 UNITS CAPS Take 1 capsule by mouth daily. For vitamin D repletion    Historical Provider, MD  CRESTOR 20 MG tablet Take 20 mg by mouth daily. For cholesterol 08/13/14   Historical Provider, MD  esomeprazole (NEXIUM) 40 MG capsule Take one capsule by mouth once daily before breakfast for stomach 06/04/15   Kimber Relic, MD  GARLIC PO Take 75 mg by mouth daily.     Historical Provider, MD   glucose blood test strip Use to check blood sugar twice daily. Dx: E11.41 05/21/15   Tiffany L Reed, DO  Insulin Lispro Prot & Lispro ([name] 75/25 KWIKPEN) (75-25) 100 UNIT/ML Kwikpen Inject 36 units into the skin 2 times daily to control blood sugar. 06/04/15   Tiffany L Reed, DO  levETIRAcetam (KEPPRA XR) 500 MG 24 hr tablet Take 2 tablets (1,000 mg total) by mouth daily. For seizures 09/12/15   Tiffany L Reed, DO  linagliptin (TRADJENTA) 5 MG TABS tablet Take 1 tablet (5 mg total) by mouth daily. For diabetes 07/15/15   Kermit Balo, DO  lisinopril (PRINIVIL,ZESTRIL) 10 MG tablet Take one tablet by mouth once daily for blood pressure 07/09/15   Tiffany L Reed, DO  metoprolol succinate (TOPROL-XL) 100 MG 24 hr tablet Take one tablet by mouth once daily for blood pressure 06/06/15   Tiffany L Reed, DO  Multiple Vitamin (MULITIVITAMIN WITH MINERALS) TABS Take 1 tablet by mouth daily.    Historical Provider, MD  nitroGLYCERIN (NITRODUR - DOSED IN MG/24 HR) 0.4 mg/hr Place 1 patch onto the skin daily. For chest pain    Historical Provider, MD  Omega 3 1200 MG CAPS Take 2,400 mg by mouth daily. For cholesterol    Historical Provider, MD  oxyCODONE (ROXICODONE) 5 MG immediate release tablet Take one tablet by mouth every 6 hours as needed for pain 10/04/15   Tiffany L Reed, DO  sertraline (ZOLOFT) 50 MG tablet Take 1 tablet (50 mg total) by mouth daily. 09/12/15   Kermit Balo, DO   Physical Exam: Blood pressure 161/83, pulse 78, temperature 98.9 F (37.2 C), temperature source Oral, resp. rate 20, height 5\' 8"  (1.727 m), weight 96.616 kg (213 lb), SpO2 97 %. Filed Vitals:   11/06/15 1248 11/06/15 1307  BP: 174/98 161/83  Pulse: 81 78  Temp:    Resp: 18 20     General:  Very pleasant, elderly black male lying in no apparent distress.  Eyes: Covered with dark sunglasses. Not examined  ENT: Moist membranes, no erythema or exudates, no frank LAD  Neck: Supple, full range of  motion  Cardiovascular: RRR, no M/R/G  Respiratory: Bilateral wheeze, nonproductive cough, minimal sputum production  Abdomen: Obese, nondistended, nontender, no masses  Skin: No obvious bruises rashes or lesions  Musculoskeletal: 5 over 5 strength in each extremity  Psychiatric: Appropriate, cooperative, alert and oriented 3  Neurologic: Patient is blind. He has no obvious acute deficits. No tongue deviation. Strength and sensation are symmetric  Labs on Admission:  Basic Metabolic Panel:  Recent Labs Lab 11/06/15 0827  NA 137  K 4.6  CL 103  CO2 27  GLUCOSE 144*  BUN 12  CREATININE 1.28*  CALCIUM 9.5   CBC:  Recent Labs Lab 11/06/15 0827  WBC 6.7  HGB 13.2  HCT 41.2  MCV 86.9  PLT 189    Radiological Exams on Admission: Dg Chest 2 View  11/06/2015  CLINICAL DATA:  Chest pain and weakness ; shortness of breath EXAM: CHEST  2 VIEW COMPARISON:  May 19, 2015. FINDINGS: There is slight scarring in the bases. There is no edema or consolidation. Heart size and pulmonary vascularity are normal. No adenopathy. No bone lesions. IMPRESSION: Mild bibasilar scarring.  No edema or consolidation. Electronically Signed   By: Bretta Bang III M.D.   On: 11/06/2015 09:17   Ct Head Wo Contrast  11/06/2015  CLINICAL DATA:  Headache with facial numbness and tingling EXAM: CT HEAD WITHOUT CONTRAST TECHNIQUE: Contiguous axial images were obtained from the base of the skull through the vertex without intravenous contrast. COMPARISON:  Head CT August 19, 2014; brain MRI August 19, 2014 FINDINGS: The ventricles are normal in size and configuration. There is mild frontal atrophy bilaterally, stable. There is no intracranial mass, hemorrhage, extra-axial fluid collection, or midline shift. The gray-white compartments appear unremarkable by CT. No acute infarct is evident. The bony calvarium appears intact. The mastoid air cells are clear. A prosthetic globe is noted on the right,  stable. The left globe appears somewhat atrophic but stable. IMPRESSION: No evidence of acute infarct. No hemorrhage or mass effect appreciable by CT. Mild frontal atrophy bilaterally. Stable atrophy of the left globe. Prosthetic globe noted on right side, stable. Electronically Signed   By: Bretta Bang III M.D.   On: 11/06/2015 13:23    EKG: Independently reviewed. Sinus rhythm  Time spent: 45 min.  Conley Canal [facility] Pager [phone]  If 7PM-7AM, please contact night-coverage [email or web address] Password [email or web address] 11/06/2015, 1:40 PM

## 2015-11-08 ENCOUNTER — Other Ambulatory Visit: Payer: Self-pay | Admitting: Internal Medicine

## 2015-11-12 DIAGNOSIS — E1140 Type 2 diabetes mellitus with diabetic neuropathy, unspecified: Secondary | ICD-10-CM | POA: Diagnosis not present

## 2015-11-12 DIAGNOSIS — M4806 Spinal stenosis, lumbar region: Secondary | ICD-10-CM | POA: Diagnosis not present

## 2015-11-12 DIAGNOSIS — Z794 Long term (current) use of insulin: Secondary | ICD-10-CM | POA: Diagnosis not present

## 2015-11-12 DIAGNOSIS — I10 Essential (primary) hypertension: Secondary | ICD-10-CM | POA: Diagnosis not present

## 2015-11-12 DIAGNOSIS — H540 Blindness, both eyes: Secondary | ICD-10-CM | POA: Diagnosis not present

## 2015-11-12 DIAGNOSIS — E1165 Type 2 diabetes mellitus with hyperglycemia: Secondary | ICD-10-CM | POA: Diagnosis not present

## 2015-11-12 DIAGNOSIS — I2510 Atherosclerotic heart disease of native coronary artery without angina pectoris: Secondary | ICD-10-CM | POA: Diagnosis not present

## 2015-11-12 DIAGNOSIS — J449 Chronic obstructive pulmonary disease, unspecified: Secondary | ICD-10-CM | POA: Diagnosis not present

## 2015-11-14 ENCOUNTER — Encounter: Payer: Self-pay | Admitting: Internal Medicine

## 2015-11-14 ENCOUNTER — Other Ambulatory Visit: Payer: Self-pay

## 2015-11-14 ENCOUNTER — Ambulatory Visit (INDEPENDENT_AMBULATORY_CARE_PROVIDER_SITE_OTHER): Payer: Medicare Other | Admitting: Internal Medicine

## 2015-11-14 VITALS — BP 140/80 | HR 72 | Temp 97.8°F | Resp 20 | Ht 68.0 in | Wt 211.8 lb

## 2015-11-14 DIAGNOSIS — Z794 Long term (current) use of insulin: Secondary | ICD-10-CM

## 2015-11-14 DIAGNOSIS — R42 Dizziness and giddiness: Secondary | ICD-10-CM

## 2015-11-14 DIAGNOSIS — E1142 Type 2 diabetes mellitus with diabetic polyneuropathy: Secondary | ICD-10-CM

## 2015-11-14 DIAGNOSIS — M5030 Other cervical disc degeneration, unspecified cervical region: Secondary | ICD-10-CM

## 2015-11-14 DIAGNOSIS — E1165 Type 2 diabetes mellitus with hyperglycemia: Secondary | ICD-10-CM | POA: Diagnosis not present

## 2015-11-14 DIAGNOSIS — R531 Weakness: Secondary | ICD-10-CM

## 2015-11-14 DIAGNOSIS — IMO0002 Reserved for concepts with insufficient information to code with codable children: Secondary | ICD-10-CM

## 2015-11-14 DIAGNOSIS — E1149 Type 2 diabetes mellitus with other diabetic neurological complication: Secondary | ICD-10-CM | POA: Diagnosis not present

## 2015-11-14 MED ORDER — OXYCODONE HCL 5 MG PO TABS: ORAL_TABLET | ORAL | Status: DC

## 2015-11-14 NOTE — Progress Notes (Signed)
Patient ID: Eddie Graves, male   DOB: 23-Feb-1931, 81 y.o.   MRN: [id number]   Location: [facility] Provider: Gwenith Spitz. Renato Gails, D.O., C.M.D.  Goals of Care: Advanced Directive information Does patient have an advance directive?: No, Would patient like information on creating an advanced directive?: Yes - Educational materials given  Chief Complaint  Patient presents with  . Medical Management of Chronic Issues    6 weeks follow-up for medication mgt, Labs from 2 months ago printed   . OTHER    Son in room with patient, Patient wants to talk about his being unsteady on his feet lately     HPI: Patient is a 81 y.o. male seen in the office today for med mgt of chronic diseases.  I'm seeing him also for ED f/u.  His notes, labs, imaging from the visit 11/30 were reviewed with him and his son as documented.  Lying on right side, when woke up, left side of his face and top of his head was tingling real bad.   When asked questions, and he had chest pain, and got workup.  CXR, EKG, enzymes done.   Then had CT head.   He was being observed, but it wasn't clear to him. No new findings on his CT.  SAH had felt similar.   Has prosthetic globe on the right side.  Sees Cathey 12/19.   Sees Dr. Sharyn Lull 12/26. Dr. Montez Morita orthopedist retired from medicine and is going into education.  No further tingling of his face and head  When he gets up, several times a day, has to stand a few seconds b/c he feels like he will fall.  Feels weak.  Says he is dizzy/lightheaded.    Glucose 97 and bp 123/stg this am with HR 49; HR usually runs 60s to 70s Glucose running in 120s fasting in the mornings.  At night glucose "about the same, maybe a little higher".  Sometimes takes after instead of before eating.  Pt mixes up sugar, pulse and blood pressure when talking about them.  Hard to tell what he is talking about and does not bring his meter.  Nurse last day is meant to be this coming Friday, but will  extend this--need nursing to assist with meds and glucose checks, education.    Review of Systems:  Review of Systems  Constitutional: Negative for fever and chills.  HENT: Negative for congestion and hearing loss.   Eyes:       Blind with prosthetic eye  Respiratory: Negative for shortness of breath.   Cardiovascular: Positive for chest pain.  Gastrointestinal: Negative for abdominal pain and constipation.  Genitourinary: Negative for dysuria.  Musculoskeletal: Positive for back pain. Negative for falls.  Skin: Negative for rash.  Neurological: Positive for dizziness, tingling and sensory change. Negative for focal weakness.       Possible syncope  Endo/Heme/Allergies:       Diabetes poorly controlled  Psychiatric/Behavioral: Positive for memory loss.    Past Medical History  Diagnosis Date  . DM (diabetes mellitus) type II controlled peripheral vascular disorder (HCC)   . Essential hypertension, benign   . Blind   . Headache(784.0)   . Sebaceous cyst   . Chronic airway obstruction, not elsewhere classified   . Cough   . Spinal stenosis, lumbar region, with neurogenic claudication   . Coronary atherosclerosis of native coronary artery   . Vitamin D deficiency   . Rash and other nonspecific skin eruption   .  Other and unspecified hyperlipidemia   . Anxiety state, unspecified   . Depressive disorder, not elsewhere classified   . Legal blindness, as defined in [place]   . Reflux esophagitis   . Unspecified constipation   . Pain in joint, site unspecified   . Memory loss   . Palpitations   . Type II diabetes mellitus with neurological manifestations, uncontrolled (HCC)   . H/O hiatal hernia     Past Surgical History  Procedure Laterality Date  . Coronary angioplasty with stent placement    . Hernia repair    . Eye surgery      Allergies  Allergen Reactions  . Flexeril [Cyclobenzaprine] Other (See Comments)    delirium      Medication List       This list is  accurate as of: 11/14/15  4:25 PM.  Always use your most recent med list.               albuterol (2.5 MG/3ML) 0.083% nebulizer solution  Commonly known as:  PROVENTIL  Take 3 mLs (2.5 mg total) by nebulization every 6 (six) hours as needed for wheezing or shortness of breath.     albuterol 108 (90 BASE) MCG/ACT inhaler  Commonly known as:  PROVENTIL HFA;VENTOLIN HFA  Inhale 2 puffs into the lungs every 2 (two) hours as needed for wheezing or shortness of breath.     amLODipine 2.5 MG tablet  Commonly known as:  NORVASC  Take one tablet by mouth once daily for blood pressure     aspirin EC 81 MG tablet  Take 1 tablet (81 mg total) by mouth daily.     CRESTOR 20 MG tablet  Generic drug:  rosuvastatin  Take 20 mg by mouth daily. For cholesterol     esomeprazole 40 MG capsule  Commonly known as:  NEXIUM  Take one capsule by mouth once daily before breakfast for stomach     GARLIC PO  Take 75 mg by mouth daily.     glucose blood test strip  Use to check blood sugar twice daily. Dx: E11.41     Insulin Lispro Prot & Lispro (75-25) 100 UNIT/ML Kwikpen  Commonly known as:  [name] 75/25 KWIKPEN  Inject 36 units into the skin 2 times daily to control blood sugar.     levETIRAcetam 500 MG 24 hr tablet  Commonly known as:  KEPPRA XR  Take 2 tablets (1,000 mg total) by mouth daily. For seizures     lisinopril 10 MG tablet  Commonly known as:  PRINIVIL,ZESTRIL  Take one tablet by mouth once daily for blood pressure     metoprolol succinate 100 MG 24 hr tablet  Commonly known as:  TOPROL-XL  Take one tablet by mouth once daily for blood pressure     multivitamin with minerals Tabs tablet  Take 1 tablet by mouth daily.     nitroGLYCERIN 0.4 mg/hr patch  Commonly known as:  NITRODUR - Dosed in mg/24 hr  Place 1 patch onto the skin daily. For chest pain     Omega 3 1200 MG Caps  Take 2,400 mg by mouth daily. For cholesterol     oxyCODONE 5 MG immediate release tablet    Commonly known as:  ROXICODONE  Take one tablet by mouth every 6 hours as needed for pain     sertraline 50 MG tablet  Commonly known as:  ZOLOFT  Take 1 tablet (50 mg total) by mouth daily.  TRADJENTA 5 MG Tabs tablet  Generic drug:  linagliptin  TAKE ONE TABLET   BY MOUTH   DAILY   FOR DIABETES     Vitamin D 2000 UNITS Caps  Take 1 capsule by mouth daily. For vitamin D repletion        Health Maintenance  Topic Date Due  . ZOSTAVAX  06/02/1993  . FOOT EXAM  04/27/2015  . OPHTHALMOLOGY EXAM  09/07/2015  . HEMOGLOBIN A1C  02/27/2016  . INFLUENZA VACCINE  07/07/2016  . TETANUS/TDAP  12/30/2021  . PNA vac Low Risk Adult  Completed    Physical Exam: Filed Vitals:   11/14/15 1559  BP: 130/90  Pulse: 72  Temp: 97.8 F (36.6 C)  TempSrc: Oral  Resp: 20  Height: 5\' 8"  (1.727 m)  Weight: 211 lb 12.8 oz (96.072 kg)  SpO2: 97%   Body mass index is 32.21 kg/(m^2). Physical Exam  Constitutional: He is oriented to person, place, and time. He appears well-developed and well-nourished.  Eyes:  right prosthetic globe; blind  Cardiovascular: Normal rate, regular rhythm and normal heart sounds.   Pulmonary/Chest: Effort normal and breath sounds normal. No respiratory distress.  Abdominal: Soft. Bowel sounds are normal.  Musculoskeletal: Normal range of motion.  Uses walking stick and assistance of his son to ambulate  Neurological: He is alert and oriented to person, place, and time.  Skin: Skin is warm and dry.  Psychiatric: He has a normal mood and affect.    Labs reviewed: Basic Metabolic Panel:  Recent Labs  1021 08/30/15 1006 11/06/15 0827  NA 138 138 137  K 4.6 4.1 4.6  CL 97 99 103  CO2 23 22 27   GLUCOSE 151* 159* 144*  BUN 14 21 12   CREATININE 1.39* 1.46* 1.28*  CALCIUM 9.8 9.6 9.5   Liver Function Tests:  Recent Labs  05/19/15 1201 08/16/15 1021 08/30/15 1006  AST 30 22 26   ALT 15* 13 18  ALKPHOS 81 110 104  BILITOT 0.7 0.3 0.3   PROT 6.8 7.2 7.2  ALBUMIN 3.4* 4.2 4.2   No results for input(s): LIPASE, AMYLASE in the last 8760 hours. No results for input(s): AMMONIA in the last 8760 hours. CBC:  Recent Labs  11/26/14 1156 03/19/15 0905 05/19/15 1201 08/30/15 1006 11/06/15 0827  WBC 5.7 6.8 7.9 7.8 6.7  NEUTROABS 2.9 3.8  --  4.0  --   HGB 12.2* 12.2* 12.3*  --  13.2  HCT 37.5 37.3* 37.6* 39.8 41.2  MCV 91 90 88.7  --  86.9  PLT 198  --  201  --  189   Lipid Panel:  Recent Labs  11/26/14 1156 03/19/15 0905 08/30/15 1006  CHOL 116 124 107  HDL 39* 48 35*  LDLCALC 49 52 52  TRIG 138 122 99  CHOLHDL 3.0 2.6 3.1   Lab Results  Component Value Date   HGBA1C 8.7* 08/30/2015    Procedures since last visit: 11/06/15:  CXR:  Mild bibasilar scarring, no edema or consolidation 11/06/15:  CT head w/o contrast:  No evidence of acute infarct. No hemorrhage or mass effect appreciable by CT. Mild frontal atrophy bilaterally. Stable atrophy of the left globe. Prosthetic globe noted on right side, stable.  Assessment/Plan 1. Positional lightheadedness -was not actually orthostatic when vitals checked, but does get lightheaded with positional change -encouraged adequate water intake -slow positional changes -also close monitoring of his glucose and checking when he feels poorly -also to eat consistently to avoid  hypoglycemia  2. Weakness -suspect due to not adhering to regular meals and glucose checks, insulin use -cont to educate -cont home health nursing to assist him  3. Degenerative disc disease, cervical -causes some of his neuropathic pain and sensory changes--has been evaluated and known to have  DDD -cont oxycodone regimen -long acting more difficult with his ckd  4. Type II diabetes mellitus with neurological manifestations, uncontrolled (HCC) -has been poorly controlled -sounds like he's doing a little better about checking his sugar but still not eating well (family does not buy him  healthy foods--eats a lot of fast food) - Hemoglobin A1c - CMP - cont humalog mix bid, lisinopril, baby asa, crestor -cont to educate  Labs/tests ordered:  No orders of the defined types were placed in this encounter.    Next appt:  6 wks med mgt  Ad Guttman L. Eleanor Dimichele, D.O. Geriatrics [facility] [address], [place] [address] Cell Phone (Mon-Fri 8am-5pm):  [phone] On Call:  [phone] & follow prompts after 5pm & weekends Office Phone:  [phone] Office Fax:  [phone]  [phone]

## 2015-11-14 NOTE — Patient Outreach (Signed)
[facility]) Care Management  11/14/2015  Michal Callicott [birth year] [id number]  Assessment: RNCM called to discuss [facility] Care management program/assess for care management needs/schedule home visit. Member states he is in the MD office at this time.  Plan: Follow up call tomorrow to schedule home visit.  Kathyrn Sheriff, RN, MSN, [facility] Community Care Coordinator Cell: [phone]

## 2015-11-15 DIAGNOSIS — I10 Essential (primary) hypertension: Secondary | ICD-10-CM | POA: Diagnosis not present

## 2015-11-15 DIAGNOSIS — E1140 Type 2 diabetes mellitus with diabetic neuropathy, unspecified: Secondary | ICD-10-CM | POA: Diagnosis not present

## 2015-11-15 DIAGNOSIS — E1165 Type 2 diabetes mellitus with hyperglycemia: Secondary | ICD-10-CM | POA: Diagnosis not present

## 2015-11-15 DIAGNOSIS — J449 Chronic obstructive pulmonary disease, unspecified: Secondary | ICD-10-CM | POA: Diagnosis not present

## 2015-11-15 DIAGNOSIS — I2510 Atherosclerotic heart disease of native coronary artery without angina pectoris: Secondary | ICD-10-CM | POA: Diagnosis not present

## 2015-11-15 DIAGNOSIS — H540 Blindness, both eyes: Secondary | ICD-10-CM | POA: Diagnosis not present

## 2015-11-15 DIAGNOSIS — Z794 Long term (current) use of insulin: Secondary | ICD-10-CM | POA: Diagnosis not present

## 2015-11-15 DIAGNOSIS — M4806 Spinal stenosis, lumbar region: Secondary | ICD-10-CM | POA: Diagnosis not present

## 2015-11-15 LAB — COMPREHENSIVE METABOLIC PANEL
ALT: 13 IU/L (ref 0–44)
AST: 21 IU/L (ref 0–40)
Albumin/Globulin Ratio: 1.5 (ref 1.1–2.5)
Albumin: 4.1 g/dL (ref 3.5–4.7)
Alkaline Phosphatase: 114 IU/L (ref 39–117)
BUN/Creatinine Ratio: 16 (ref 10–22)
BUN: 21 mg/dL (ref 8–27)
Bilirubin Total: 0.5 mg/dL (ref 0.0–1.2)
CO2: 21 mmol/L (ref 18–29)
Calcium: 9.9 mg/dL (ref 8.6–10.2)
Chloride: 103 mmol/L (ref 97–106)
Creatinine, Ser: 1.33 mg/dL — ABNORMAL HIGH (ref 0.76–1.27)
GFR calc Af Amer: 57 mL/min/{1.73_m2} — ABNORMAL LOW (ref >59)
GFR calc non Af Amer: 49 mL/min/{1.73_m2} — ABNORMAL LOW (ref >59)
Globulin, Total: 2.8 g/dL (ref 1.5–4.5)
Glucose: 88 mg/dL (ref 65–99)
Potassium: 4.8 mmol/L (ref 3.5–5.2)
Sodium: 139 mmol/L (ref 136–144)
Total Protein: 6.9 g/dL (ref 6.0–8.5)

## 2015-11-15 LAB — HEMOGLOBIN A1C
Est. average glucose Bld gHb Est-mCnc: 169 mg/dL
Hgb A1c MFr Bld: 7.5 % — ABNORMAL HIGH (ref 4.8–5.6)

## 2015-11-17 DIAGNOSIS — I2510 Atherosclerotic heart disease of native coronary artery without angina pectoris: Secondary | ICD-10-CM | POA: Diagnosis not present

## 2015-11-17 DIAGNOSIS — J449 Chronic obstructive pulmonary disease, unspecified: Secondary | ICD-10-CM | POA: Diagnosis not present

## 2015-11-17 DIAGNOSIS — E1165 Type 2 diabetes mellitus with hyperglycemia: Secondary | ICD-10-CM | POA: Diagnosis not present

## 2015-11-17 DIAGNOSIS — E1140 Type 2 diabetes mellitus with diabetic neuropathy, unspecified: Secondary | ICD-10-CM | POA: Diagnosis not present

## 2015-11-19 ENCOUNTER — Other Ambulatory Visit: Payer: Self-pay

## 2015-11-19 DIAGNOSIS — M4806 Spinal stenosis, lumbar region: Secondary | ICD-10-CM | POA: Diagnosis not present

## 2015-11-19 DIAGNOSIS — I10 Essential (primary) hypertension: Secondary | ICD-10-CM | POA: Diagnosis not present

## 2015-11-19 DIAGNOSIS — Z794 Long term (current) use of insulin: Secondary | ICD-10-CM | POA: Diagnosis not present

## 2015-11-19 DIAGNOSIS — E1140 Type 2 diabetes mellitus with diabetic neuropathy, unspecified: Secondary | ICD-10-CM | POA: Diagnosis not present

## 2015-11-19 DIAGNOSIS — J449 Chronic obstructive pulmonary disease, unspecified: Secondary | ICD-10-CM | POA: Diagnosis not present

## 2015-11-19 DIAGNOSIS — E1165 Type 2 diabetes mellitus with hyperglycemia: Secondary | ICD-10-CM | POA: Diagnosis not present

## 2015-11-19 DIAGNOSIS — I2510 Atherosclerotic heart disease of native coronary artery without angina pectoris: Secondary | ICD-10-CM | POA: Diagnosis not present

## 2015-11-19 DIAGNOSIS — H540 Blindness, both eyes: Secondary | ICD-10-CM | POA: Diagnosis not present

## 2015-11-19 NOTE — Patient Outreach (Signed)
[facility]) Care Management  11/19/2015  Promise Bushong [birth year] [id number]  Assessment: RNCM called to follow up referral from emergency room. RNCM discussed [facility] care management services. Member agrees to participate.   Member with history of COPD, diabetes,hypertension, heart failure. Member reports he has heart problems and takes nitroglycerin sublingual as needed. Member is also blind. Denies lung problems, but states "I do have a terrible cough but that is from years of smoking". Member states he has an "inhaler and a breathing thing I use at night". History of diabetes and reports "my sugar is getting better.right now since I learned how to test it... it is running between 118-135".   Member reports his son lives with him and helps him out as needed. Active with meals on wheels service.  Plan: home visit scheduled for next week.  Kathyrn Sheriff, RN, MSN, [facility] Community Care Coordinator Cell: [phone]

## 2015-11-21 DIAGNOSIS — E1141 Type 2 diabetes mellitus with diabetic mononeuropathy: Secondary | ICD-10-CM | POA: Diagnosis not present

## 2015-11-25 ENCOUNTER — Telehealth: Payer: Self-pay | Admitting: *Deleted

## 2015-11-25 ENCOUNTER — Ambulatory Visit: Payer: Medicare Other | Admitting: Pharmacotherapy

## 2015-11-25 DIAGNOSIS — M5030 Other cervical disc degeneration, unspecified cervical region: Secondary | ICD-10-CM

## 2015-11-25 NOTE — Telephone Encounter (Signed)
Called [facility] and no Rx for pain medication has been filled there before. Will check with patient tomorrow of which pharmacy he takes it to.

## 2015-11-25 NOTE — Telephone Encounter (Signed)
Patient called and stated that he needed his refill on his Oxycodone Rx. This Rx was printed on 11/14/2015. Patient states that he has misplaced it and wants another one printed. Please Advise.

## 2015-11-25 NOTE — Telephone Encounter (Signed)
Please first check with the pharmacy to make sure it was not filled.  If not, just this once, we can reprint.   Please educate him on the importance of keeping such things locked up.  He previously had an issue with one of his sons stealing his pain medications.  I've told him about this before but I thought he would have filled it that day.

## 2015-11-26 ENCOUNTER — Other Ambulatory Visit: Payer: Self-pay | Admitting: Internal Medicine

## 2015-11-26 ENCOUNTER — Ambulatory Visit: Payer: Self-pay

## 2015-11-26 ENCOUNTER — Other Ambulatory Visit: Payer: Self-pay

## 2015-11-26 VITALS — BP 124/80 | HR 73 | Resp 20 | Ht 66.5 in | Wt 210.0 lb

## 2015-11-26 DIAGNOSIS — Z599 Problem related to housing and economic circumstances, unspecified: Secondary | ICD-10-CM

## 2015-11-26 MED ORDER — OXYCODONE HCL 5 MG PO TABS: ORAL_TABLET | ORAL | Status: DC

## 2015-11-26 NOTE — Telephone Encounter (Signed)
Called patient he stated he takes Rx's to [facility]. I called and spoke with pharmacist and she checked the system wide data base and patient has not gotten this Rx filled since 10/08/15. Printed Rx. Patient notified and will pick up.

## 2015-11-27 ENCOUNTER — Other Ambulatory Visit: Payer: Self-pay

## 2015-11-27 MED ORDER — ESOMEPRAZOLE MAGNESIUM 40 MG PO CPDR: DELAYED_RELEASE_CAPSULE | ORAL | Status: DC

## 2015-11-27 NOTE — Patient Outreach (Addendum)
[facility]) Care Management  [facility] Care Manager  11/27/2015   Alyas Creary [birth year] [id number]  Subjective:   Objective: BP 124/80 mmHg  Pulse 73  Resp 20  Ht 1.689 m (5' 6.5")  Wt 210 lb (95.255 kg)  BMI 33.39 kg/m2  SpO2 98%,lungs with faint wheeze noted. Denies shortness of breath, heart rate regular.   Current Medications:  Current Outpatient Prescriptions  Medication Sig Dispense Refill  . albuterol (PROVENTIL) (2.5 MG/3ML) 0.083% nebulizer solution Take 3 mLs (2.5 mg total) by nebulization every 6 (six) hours as needed for wheezing or shortness of breath. 75 mL 12  . aspirin EC 81 MG tablet Take 1 tablet (81 mg total) by mouth daily. 30 tablet 3  . Cholecalciferol (VITAMIN D) 2000 UNITS CAPS Take 1 capsule by mouth daily. For vitamin D repletion    . GARLIC PO Take 75 mg by mouth daily.     Marland Kitchen glucose blood test strip Use to check blood sugar twice daily. Dx: E11.41 100 each 12  . Insulin Lispro Prot & Lispro ([name] 75/25 KWIKPEN) (75-25) 100 UNIT/ML Kwikpen Inject 36 units into the skin 2 times daily to control blood sugar. 15 mL 11  . Multiple Vitamin (MULITIVITAMIN WITH MINERALS) TABS Take 1 tablet by mouth daily.    . nitroGLYCERIN (NITRODUR - DOSED IN MG/24 HR) 0.4 mg/hr Place 1 patch onto the skin daily. For chest pain    . amLODipine (NORVASC) 2.5 MG tablet Take one tablet by mouth once daily for blood pressure 90 tablet 3  . CRESTOR 20 MG tablet Take 20 mg by mouth daily. For cholesterol    . esomeprazole (NEXIUM) 40 MG capsule Take one capsule by mouth once daily before breakfast for stomach 30 capsule 5  . levETIRAcetam (KEPPRA XR) 500 MG 24 hr tablet Take 2 tablets (1,000 mg total) by mouth daily. For seizures 180 tablet 1  . lisinopril (PRINIVIL,ZESTRIL) 10 MG tablet Take one tablet by mouth once daily for blood pressure 30 tablet 5  . metoprolol succinate (TOPROL-XL) 100 MG 24 hr tablet Take one tablet by mouth once daily for blood  pressure 90 tablet 3  . Omega 3 1200 MG CAPS Take 2,400 mg by mouth daily. For cholesterol    . oxyCODONE (ROXICODONE) 5 MG immediate release tablet Take one tablet by mouth every 6 hours as needed for pain 120 tablet 0  . PROAIR HFA 108 (90 BASE) MCG/ACT inhaler INHALE TWO   PUFFS INTO THE LUNGS EVERY   TWO   HOURS IF NEEDED 18 g 5  . sertraline (ZOLOFT) 50 MG tablet Take 1 tablet (50 mg total) by mouth daily. 30 tablet 3  . TRADJENTA 5 MG TABS tablet TAKE ONE TABLET   BY MOUTH   DAILY   FOR DIABETES 30 tablet 5   No current facility-administered medications for this visit.    Functional Status:  In your present state of health, do you have any difficulty performing the following activities: 05/19/2015  Hearing? N  Vision? Y  Difficulty concentrating or making decisions? N  Walking or climbing stairs? N  Dressing or bathing? N  Doing errands, shopping? Y    Fall/Depression Screening: PHQ 2/9 Scores 11/26/2015 05/27/2015 11/29/2014 07/24/2013 06/08/2013 03/16/2013  PHQ - 2 Score 3 0 0 2 1 3   PHQ- 9 Score 11 - - 7 4 6    Fall Risk  11/26/2015 11/14/2015 10/04/2015 09/02/2015 08/16/2015  Falls in the past year? No No No  No No  Risk for fall due to : Impaired vision - - - -     Assessment: referral received from the emergency department-regarding medication management assistance. 81 year old with history of COPD, diabetes, heart failure. 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 Currently son and grandson live with member, but member reports this is not a permanent arrangement. Denies pain at this time.  Community resource-member with history of depression-states he know that increasing activity would help. Request assistance with obtaining services to be able to [facility] to [facility] [employer]/personal care assistance. Discussed Community Health Response Program. Member reports he is unable to afford.  Medication management-member reports his main concern is medication management, long term. Member  states his son was helping him with his medication, but is sometimes inconsistent. Son currently lives with member, but member reports he may be moving soon. Currently, member is active with home health. They have arranged for his medication pill box to be filled and delivered by the [employer]. Member reports he needs someone to come in at least once a month to make sure his medications are "straight". RNCM discussed community health response program, but member reports he is not able to afford. Member is on insulin Humolog 75/25 36 units- currently he starts his pen at zero and counts the clicks to know he is injecting 36 units.  Member reports he would like to learn more about heart failure management. He states he does not have talking scales. RNCM will begin education regarding heart failure management. Member needs a talking scale.   Plan: social work consult, pharmacy consult, Home visit next month.   THN CM Care Plan Problem One        Most Recent Value   Care Plan Problem One  knowledge defict related to heart failure self management   Role Documenting the Problem One  Care Management Coordinator   Care Plan for Problem One  Active   THN Long Term Goal (31-90 days)  member will verbalize at least three strategies for heart failure managment within the next 31-90 days.   THN Long Term Goal Start Date  11/26/15   Interventions for Problem One Long Term Goal  home visit completed, discussed daily weights   THN CM Short Term Goal #1 (0-30 days)  member will obtain talking scale within the next 30 days.   THN CM Short Term Goal #1 Start Date  11/26/15   Interventions for Short Term Goal #1  social work consult regarding community resources,   [facility] CM Short Term Goal #2 (0-30 days)  member will call RNCM/24 hour nurse advice line and/or provider as needed.   THN CM Short Term Goal #2 Start Date  11/26/15   Interventions for Short Term Goal #2  provided RNCM contact number and 24 hour  nurse line.     Kathyrn Sheriff, RN, MSN, [facility] Community Care Coordinator Cell: [phone]

## 2015-11-28 ENCOUNTER — Other Ambulatory Visit: Payer: Self-pay

## 2015-11-28 DIAGNOSIS — E1140 Type 2 diabetes mellitus with diabetic neuropathy, unspecified: Secondary | ICD-10-CM | POA: Diagnosis not present

## 2015-11-28 DIAGNOSIS — H540 Blindness, both eyes: Secondary | ICD-10-CM | POA: Diagnosis not present

## 2015-11-28 DIAGNOSIS — E1165 Type 2 diabetes mellitus with hyperglycemia: Secondary | ICD-10-CM | POA: Diagnosis not present

## 2015-11-28 DIAGNOSIS — I2510 Atherosclerotic heart disease of native coronary artery without angina pectoris: Secondary | ICD-10-CM | POA: Diagnosis not present

## 2015-11-28 DIAGNOSIS — I10 Essential (primary) hypertension: Secondary | ICD-10-CM | POA: Diagnosis not present

## 2015-11-28 DIAGNOSIS — M4806 Spinal stenosis, lumbar region: Secondary | ICD-10-CM | POA: Diagnosis not present

## 2015-11-28 DIAGNOSIS — J449 Chronic obstructive pulmonary disease, unspecified: Secondary | ICD-10-CM | POA: Diagnosis not present

## 2015-11-28 DIAGNOSIS — Z794 Long term (current) use of insulin: Secondary | ICD-10-CM | POA: Diagnosis not present

## 2015-11-28 NOTE — Patient Outreach (Signed)
[facility]) Care Management  11/28/2015  Simran Mannis 1931/05/08 [id number]  Assessment: RNCM received message from primary care that member is only supposed to be injecting insulin once a day instead of twice a day. RNCM called and spoke with member who confirmed that has been taking his insulin twice a day every since he started.  RNCM called left voice message for primary care provider-Request office call to follow up with member regarding his questions/insulin dosing.   Plan: Update [facility] pharmacy, continue to follow.  Kathyrn Sheriff, RN, MSN, [facility] Community Care Coordinator Cell: [phone]

## 2015-12-03 ENCOUNTER — Encounter: Payer: Self-pay | Admitting: *Deleted

## 2015-12-05 ENCOUNTER — Other Ambulatory Visit: Payer: Self-pay | Admitting: *Deleted

## 2015-12-05 DIAGNOSIS — I2510 Atherosclerotic heart disease of native coronary artery without angina pectoris: Secondary | ICD-10-CM | POA: Diagnosis not present

## 2015-12-05 DIAGNOSIS — I10 Essential (primary) hypertension: Secondary | ICD-10-CM | POA: Diagnosis not present

## 2015-12-05 DIAGNOSIS — E1165 Type 2 diabetes mellitus with hyperglycemia: Secondary | ICD-10-CM | POA: Diagnosis not present

## 2015-12-05 DIAGNOSIS — Z794 Long term (current) use of insulin: Secondary | ICD-10-CM | POA: Diagnosis not present

## 2015-12-05 DIAGNOSIS — M4806 Spinal stenosis, lumbar region: Secondary | ICD-10-CM | POA: Diagnosis not present

## 2015-12-05 DIAGNOSIS — J449 Chronic obstructive pulmonary disease, unspecified: Secondary | ICD-10-CM | POA: Diagnosis not present

## 2015-12-05 DIAGNOSIS — E1140 Type 2 diabetes mellitus with diabetic neuropathy, unspecified: Secondary | ICD-10-CM | POA: Diagnosis not present

## 2015-12-05 DIAGNOSIS — H540 Blindness, both eyes: Secondary | ICD-10-CM | POA: Diagnosis not present

## 2015-12-05 NOTE — Patient Outreach (Signed)
.  [facility]) Care Management  12/05/2015  [name] [birth year] [id number]  CSW received a new referral on patient from Kathyrn Sheriff, [facility] with [facility] Care Management, indicating that patient would benefit from social work services and resources.  More specifically, Mrs. [name] reported that patient is legally blind and currently suffering from Depression.  Mrs. [name] believes that patient would benefit from referrals to community agencies and resources that may be able to offer socialization and adult interaction.  Mrs. [name] stated that patient is requesting assistance with a referral to a gym to become more active and involved in a routine exercise program.  Mrs. [name] reported that patient is retired from [employer] for [employer]; therefore, is already aware of all resources and services available to him with regards to his disabilities.  Mrs. [name] would like for patient to receive personal care services in the home; however, patient has already admitted that he is unable to afford any added expenses each month.  Mrs. [name] has already spoken with patient about the sliding scale fee offered through [facility] Response Program), and patient declined a referral.  Last, Mrs. [name] requested that CSW complete an FAF (Financial Assessment Form) on patient to determine whether or not patient is eligible to receive a talking scale, free of charge, through [employer]. CSW made an initial attempt to try and contact patient today to perform phone assessment, as well as assess and assist with social needs and services, without success.  A HIPAA complaint message was left for patient on voicemail.  CSW is currently awaiting a return call.  [name], BSW, MSW, LCSW  Licensed Restaurant manager, fast food Health System  Mailing [place] [address] [place], [place]  [address] Physical Address-[address], [place], [place] [address] Toll Free Main # [phone] Fax # [phone] Cell # [phone]  Fax # [phone]  [email or web address]

## 2015-12-08 ENCOUNTER — Encounter: Payer: Self-pay | Admitting: Internal Medicine

## 2015-12-11 DIAGNOSIS — M4806 Spinal stenosis, lumbar region: Secondary | ICD-10-CM | POA: Diagnosis not present

## 2015-12-11 DIAGNOSIS — J449 Chronic obstructive pulmonary disease, unspecified: Secondary | ICD-10-CM | POA: Diagnosis not present

## 2015-12-11 DIAGNOSIS — Z794 Long term (current) use of insulin: Secondary | ICD-10-CM | POA: Diagnosis not present

## 2015-12-11 DIAGNOSIS — E1165 Type 2 diabetes mellitus with hyperglycemia: Secondary | ICD-10-CM | POA: Diagnosis not present

## 2015-12-11 DIAGNOSIS — I2510 Atherosclerotic heart disease of native coronary artery without angina pectoris: Secondary | ICD-10-CM | POA: Diagnosis not present

## 2015-12-11 DIAGNOSIS — I10 Essential (primary) hypertension: Secondary | ICD-10-CM | POA: Diagnosis not present

## 2015-12-11 DIAGNOSIS — H540 Blindness, both eyes: Secondary | ICD-10-CM | POA: Diagnosis not present

## 2015-12-11 DIAGNOSIS — E1140 Type 2 diabetes mellitus with diabetic neuropathy, unspecified: Secondary | ICD-10-CM | POA: Diagnosis not present

## 2015-12-12 ENCOUNTER — Encounter: Payer: Self-pay | Admitting: *Deleted

## 2015-12-12 ENCOUNTER — Other Ambulatory Visit: Payer: Self-pay | Admitting: *Deleted

## 2015-12-12 NOTE — Patient Outreach (Signed)
[facility]) Care Management  [facility] Social Work  12/12/2015  Eddie Graves [birth year] [id number]  Subjective:    "Can you help me find a wife"?  "Seriously, [facility]".  Objective:   CSW agreed to assist patient with trying to obtain reliable and affordable transportation services to and from the [facility] to participate in an exercise program.  Current Medications:  Current Outpatient Prescriptions  Medication Sig Dispense Refill  . albuterol (PROVENTIL) (2.5 MG/3ML) 0.083% nebulizer solution Take 3 mLs (2.5 mg total) by nebulization every 6 (six) hours as needed for wheezing or shortness of breath. 75 mL 12  . amLODipine (NORVASC) 2.5 MG tablet Take one tablet by mouth once daily for blood pressure 90 tablet 3  . aspirin EC 81 MG tablet Take 1 tablet (81 mg total) by mouth daily. 30 tablet 3  . Cholecalciferol (VITAMIN D) 2000 UNITS CAPS Take 1 capsule by mouth daily. For vitamin D repletion    . CRESTOR 20 MG tablet Take 20 mg by mouth daily. For cholesterol    . esomeprazole (NEXIUM) 40 MG capsule Take one capsule by mouth once daily before breakfast for stomach 30 capsule 11  . GARLIC PO Take 75 mg by mouth daily.     Marland Kitchen glucose blood test strip Use to check blood sugar twice daily. Dx: E11.41 100 each 12  . Insulin Lispro Prot & Lispro ([name] 75/25 KWIKPEN) (75-25) 100 UNIT/ML Kwikpen Inject 36 units into the skin 2 times daily to control blood sugar. 15 mL 11  . levETIRAcetam (KEPPRA XR) 500 MG 24 hr tablet Take 2 tablets (1,000 mg total) by mouth daily. For seizures 180 tablet 1  . lisinopril (PRINIVIL,ZESTRIL) 10 MG tablet Take one tablet by mouth once daily for blood pressure 30 tablet 5  . metoprolol succinate (TOPROL-XL) 100 MG 24 hr tablet Take one tablet by mouth once daily for blood pressure 90 tablet 3  . Multiple Vitamin (MULITIVITAMIN WITH MINERALS) TABS Take 1 tablet by mouth daily.    . nitroGLYCERIN (NITRODUR - DOSED  IN MG/24 HR) 0.4 mg/hr Place 1 patch onto the skin daily. For chest pain    . Omega 3 1200 MG CAPS Take 2,400 mg by mouth daily. For cholesterol    . oxyCODONE (ROXICODONE) 5 MG immediate release tablet Take one tablet by mouth every 6 hours as needed for pain 120 tablet 0  . PROAIR HFA 108 (90 BASE) MCG/ACT inhaler INHALE TWO   PUFFS INTO THE LUNGS EVERY   TWO   HOURS IF NEEDED 18 g 5  . sertraline (ZOLOFT) 50 MG tablet Take 1 tablet (50 mg total) by mouth daily. 30 tablet 3  . TRADJENTA 5 MG TABS tablet TAKE ONE TABLET   BY MOUTH   DAILY   FOR DIABETES 30 tablet 5   No current facility-administered medications for this visit.    Functional Status:  In your present state of health, do you have any difficulty performing the following activities: 05/19/2015  Hearing? N  Vision? Y  Difficulty concentrating or making decisions? N  Walking or climbing stairs? N  Dressing or bathing? N  Doing errands, shopping? Y    Fall/Depression Screening:  PHQ 2/9 Scores 11/26/2015 05/27/2015 11/29/2014 07/24/2013 06/08/2013 03/16/2013  PHQ - 2 Score 3 0 0 _0 PHQ- 9 Score 11 - - _1 Assessment:   CSW was able to  make initial contact with patient today to perform phone assessment, as well as assess and assist with social work needs and services.  CSW introduced self, explained role and types of services provided through [place] Management ([place] [facility]).  CSW further explained to patient that CSW works with patient's RNCM, also with [place] Management, Thea Silversmith. CSW then explained the reason for the call, indicating that Mrs. Juleen China thought that patient would benefit from social work services and resources to assist with arranging transportation to and from the gym so that patient can actively participate in an exercise program.  Mrs. Juleen China also requested that CSW complete an FAF (Financial Assessment Form) on patient to determine whether or not patient is eligible  to receive financial assistance to purchase a talking weight scale through [place] Management.  CSW obtained two HIPAA compliant identifiers from patient, which included patient's name and date of birth. Patient was extremely pleasant, offering humor where appropriate.  Patient admits that he is no longer experiencing symptoms of Depression, and that he is "ready to get out and face the world again".  Patient went on to say that he is interested in "getting back into the gym", requesting assistance with obtaining reliable and affordable transportation services.  CSW spoke with patient about SCAT Paramedic), agreeing to assist patient with completion of an application for services.  Patient denied, indicating that he is already a recipient of SCAT, but not wanting to utilize them to get to and from the [facility] because he "has to wait too long to be picked up and dropped back off".  CSW then spoke with patient about transportation services through [employer] with [employer] [facility] [place].  Patient took down their contact information, agreeing to give them a call to see about arranging transportation.  CSW spoke of a few additional transportation resources; however, patient was not interested, reporting "I can't afford to pay; otherwise, I would already be using them". CSW was able to complete an FAF on patient to determine that patient is eligible to receive financial assistance to purchase a talking weight scale to monitor his Congestive Heart Failure, at the expense of [facility] Care Management.  CSW has notified patient's RNCM with [place] Management, Thea Silversmith to request that she deliver the scale to patient's home, during the next routine home visit, as Mrs. Juleen China will need to educate patient on proper use.  Last, CSW spoke with patient about PCS ([place]) in the home.  Patient reported that he and Mrs. Juleen China already pursued this avenue, determining  that patient is not eligible to receive CAPS Medical illustrator) through the [place] and/or [employer] ([place]) through [facility], as patient is not an Adult Medicaid recipient.  Patient reports that he is unable to afford home care services through [facility] ([place]) through the [place], as they operate on a sliding scale fee, which patient is unable to afford.  No additional social work needs identified at this time.   Plan:   CSW will perform a case closure on patient, as all goals of treatment have been met from social work standpoint and no additional social work needs have been identified at this time. CSW will notify patient's RNCM with [place] [facility], Thea Silversmith of CSW's plans to close patient's case. CSW will fax a correspondence letter to patient's Primary  Care Physician, Dr. Hollace Kinnier to ensure that Dr. Mariea Clonts is aware of CSW's involvement with patient's care. CSW will submit a case closure request to Lurline Del, Care Management Assistant with [place] Management, in the form of an [employer].  CSW will ensure that Mrs. Laurance Flatten is aware of Collier Flowers, RNCM with [place] Management, continued involvement with patient's care.  Nat Christen, BSW, MSW, LCSW  Licensed Education officer, environmental Health System  Mailing [place] [address] [place], [place] [address] Physical Address-[address], [place], [place] [address] Toll Free Main # [phone] Fax # [phone] Cell # [phone]  Fax # [phone]  [email or web address]

## 2015-12-18 ENCOUNTER — Ambulatory Visit: Payer: Self-pay

## 2015-12-19 ENCOUNTER — Other Ambulatory Visit: Payer: Self-pay | Admitting: *Deleted

## 2015-12-19 DIAGNOSIS — E1165 Type 2 diabetes mellitus with hyperglycemia: Secondary | ICD-10-CM | POA: Diagnosis not present

## 2015-12-19 DIAGNOSIS — M4806 Spinal stenosis, lumbar region: Secondary | ICD-10-CM | POA: Diagnosis not present

## 2015-12-19 DIAGNOSIS — I10 Essential (primary) hypertension: Secondary | ICD-10-CM | POA: Diagnosis not present

## 2015-12-19 DIAGNOSIS — J449 Chronic obstructive pulmonary disease, unspecified: Secondary | ICD-10-CM | POA: Diagnosis not present

## 2015-12-19 DIAGNOSIS — H540 Blindness, both eyes: Secondary | ICD-10-CM | POA: Diagnosis not present

## 2015-12-19 DIAGNOSIS — I2510 Atherosclerotic heart disease of native coronary artery without angina pectoris: Secondary | ICD-10-CM | POA: Diagnosis not present

## 2015-12-19 DIAGNOSIS — Z794 Long term (current) use of insulin: Secondary | ICD-10-CM | POA: Diagnosis not present

## 2015-12-19 DIAGNOSIS — E1140 Type 2 diabetes mellitus with diabetic neuropathy, unspecified: Secondary | ICD-10-CM | POA: Diagnosis not present

## 2015-12-19 MED ORDER — LEVALBUTEROL HCL 1.25 MG/3ML IN NEBU: 1.2500 mg | INHALATION_SOLUTION | RESPIRATORY_TRACT | Status: DC | PRN

## 2015-12-23 ENCOUNTER — Other Ambulatory Visit: Payer: Self-pay

## 2015-12-23 ENCOUNTER — Ambulatory Visit: Payer: Medicare Other | Admitting: Pharmacotherapy

## 2015-12-23 NOTE — Patient Outreach (Signed)
[facility]) Care Management  12/23/2015  Nayib Remer Aug 26, 1931 [id number]  Care Coordination-RNCM called to inquire about talking scales. No answer. HIPPA compliant message left.   Plan: continue to follow. Home visit scheduled for Wednesday.  Kathyrn Sheriff, RN, MSN, [facility] Community Care Coordinator Cell: [phone]

## 2015-12-24 ENCOUNTER — Other Ambulatory Visit: Payer: Self-pay

## 2015-12-24 NOTE — Patient Outreach (Signed)
[facility]) Care Management  [place]   12/24/2015  Talbot Monarch 12-05-1931 [id number]  Subjective: Mr. Marcello Moores is an 82 year old male who was referred to me by Thea Silversmith, RN, nurse care manager, for medication review.  He is currently receiving his medications from [facility].  They fill his medication in multiple pill boxes and deliver them to him.  He has all his medications in his pill box.  The only discrepancy is that the pharmacy has levetiracetam 500 mg twice a day where EPIC has it as 1000 mg once a day.  I asked Mr. Marcello Moores to show me how he is using his albuterol inhaler.  He is taking four inhalations all at once.  He is not inhaling between each actuation and he is not holding his breath for 10 seconds after each actuation as well.  He does not remember what each medication is for either.    Objective:   Current Medications: Current Outpatient Prescriptions  Medication Sig Dispense Refill  . albuterol (PROVENTIL) (2.5 MG/3ML) 0.083% nebulizer solution Take 3 mLs (2.5 mg total) by nebulization every 6 (six) hours as needed for wheezing or shortness of breath. 75 mL 12  . amLODipine (NORVASC) 2.5 MG tablet Take one tablet by mouth once daily for blood pressure 90 tablet 3  . aspirin EC 81 MG tablet Take 1 tablet (81 mg total) by mouth daily. 30 tablet 3  . Cholecalciferol (VITAMIN D) 2000 UNITS CAPS Take 1 capsule by mouth daily. For vitamin D repletion    . CRESTOR 20 MG tablet Take 20 mg by mouth daily. For cholesterol    . esomeprazole (NEXIUM) 40 MG capsule Take one capsule by mouth once daily before breakfast for stomach 30 capsule 11  . etodolac (LODINE) 400 MG tablet Take 400 mg by mouth 2 (two) times daily.    Marland Kitchen GARLIC PO Take 75 mg by mouth daily.     Marland Kitchen glucose blood test strip Use to check blood sugar twice daily. Dx: E11.41 100 each 12  . Insulin Lispro Prot & Lispro ([name] 75/25 KWIKPEN) (75-25) 100 UNIT/ML Kwikpen Inject  36 units into the skin 2 times daily to control blood sugar. 15 mL 11  . levETIRAcetam (KEPPRA XR) 500 MG 24 hr tablet Take 2 tablets (1,000 mg total) by mouth daily. For seizures 180 tablet 1  . lisinopril (PRINIVIL,ZESTRIL) 10 MG tablet Take one tablet by mouth once daily for blood pressure 30 tablet 5  . metoprolol succinate (TOPROL-XL) 100 MG 24 hr tablet Take one tablet by mouth once daily for blood pressure 90 tablet 3  . Multiple Vitamin (MULITIVITAMIN WITH MINERALS) TABS Take 1 tablet by mouth daily.    . nitroGLYCERIN (NITRODUR - DOSED IN MG/24 HR) 0.4 mg/hr Place 1 patch onto the skin daily. For chest pain    . Omega 3 1200 MG CAPS Take 2,400 mg by mouth daily. For cholesterol    . oxyCODONE (ROXICODONE) 5 MG immediate release tablet Take one tablet by mouth every 6 hours as needed for pain 120 tablet 0  . PROAIR HFA 108 (90 BASE) MCG/ACT inhaler INHALE TWO   PUFFS INTO THE LUNGS EVERY   TWO   HOURS IF NEEDED 18 g 5  . sertraline (ZOLOFT) 50 MG tablet Take 1 tablet (50 mg total) by mouth daily. 30 tablet 3  . TRADJENTA 5 MG TABS tablet TAKE ONE TABLET   BY MOUTH   DAILY  FOR DIABETES 30 tablet 5  . levalbuterol (XOPENEX) 1.25 MG/3ML nebulizer solution Take 1.25 mg by nebulization every 4 (four) hours as needed for wheezing. (Patient not taking: Reported on 12/24/2015) 72 mL 12   No current facility-administered medications for this visit.    Functional Status: In your present state of health, do you have any difficulty performing the following activities: 12/12/2015 05/19/2015  Hearing? Y N  Vision? Y Y  Difficulty concentrating or making decisions? N N  Walking or climbing stairs? Y N  Dressing or bathing? N N  Doing errands, shopping? Tempie Donning  Preparing Food and eating ? N -  Using the Toilet? N -  In the past six months, have you accidently leaked urine? N -  Do you have problems with loss of bowel control? N -  Managing your Medications? N -  Managing your Finances? Y -   Housekeeping or managing your Housekeeping? Y -    Fall/Depression Screening: PHQ 2/9 Scores 12/12/2015 11/26/2015 05/27/2015 11/29/2014 07/24/2013 06/08/2013 03/16/2013  PHQ - 2 Score 1 3 0 0 '2 1 3  '$ PHQ- 9 Score 3 11 - - '7 4 6    '$ Assessment:  Medication Management: Mr. Marcello Moores has a very good system currently for managing his medications.  [place] is able to fill his pill boxes appropriately.  Albuterol Inhaler technique: Mr. Marcello Moores was not using his inhaler appropriately.  After proper instruction, he was able to demonstrate proper technique.   Drugs sorted by system:  Neurologic/Psychologic: sertraline, levetiracetam  Cardiovascular: amlodipine, aspirin, crestor, metoprolol, lisinopril, nitroglycerin patch, omega 3,   Pulmonary/Allergy: levalbuterol (nebulizer), albuterol (inhaler),   Gastrointestinal: esomeprazole   Endocrine: [name] 75/25, trajenta  Renal: none  Topical: none  Pain: etodolac, oxycodone  Vitamins/Minerals: vitamin D, multivitamin  Infectious Diseases: none  Miscellaneous: garlic   Duplications in therapy: albuterol and levalbuterol Gaps in therapy: none Medications to avoid in the elderly: esomeprazole (increase risk of fractures and C. Diff) Drug interactions: no major Other issues noted: none  Plan: 1. Mr. Marcello Moores has a good medication management plan in place with [place].   2.  I was able to educate Mr. Thomas on the appropriate way to use his albuterol inhaler.  He was able to demonstrate the proper technique.  I have asked Denton Brick to follow up at her next visit and have him demonstrate it to her as well.  3.  No major drug interactions noted. He is on omeprazole which may place him at high risk for fractures and C. Diff.  I would recommend reassessment if he has not been tried on a H2 blocker like famotidine.  4.  I will close him to pharmacy since all his pharmacy needs have been met.  I am happy to follow on an as  needed basis for any future pharmacy needs.   Deanne Coffer, PharmD, [place] [phone]

## 2015-12-25 ENCOUNTER — Other Ambulatory Visit: Payer: Self-pay

## 2015-12-25 VITALS — BP 130/70 | HR 64 | Resp 20

## 2015-12-25 DIAGNOSIS — J441 Chronic obstructive pulmonary disease with (acute) exacerbation: Secondary | ICD-10-CM

## 2015-12-26 ENCOUNTER — Ambulatory Visit (INDEPENDENT_AMBULATORY_CARE_PROVIDER_SITE_OTHER): Payer: Medicare Other | Admitting: Internal Medicine

## 2015-12-26 ENCOUNTER — Encounter: Payer: Self-pay | Admitting: Internal Medicine

## 2015-12-26 VITALS — BP 110/78 | HR 70 | Temp 98.0°F | Resp 20 | Ht 67.0 in | Wt 209.6 lb

## 2015-12-26 DIAGNOSIS — IMO0001 Reserved for inherently not codable concepts without codable children: Secondary | ICD-10-CM

## 2015-12-26 DIAGNOSIS — I10 Essential (primary) hypertension: Secondary | ICD-10-CM

## 2015-12-26 DIAGNOSIS — M48061 Spinal stenosis, lumbar region without neurogenic claudication: Secondary | ICD-10-CM

## 2015-12-26 DIAGNOSIS — M4806 Spinal stenosis, lumbar region: Secondary | ICD-10-CM | POA: Diagnosis not present

## 2015-12-26 DIAGNOSIS — E1165 Type 2 diabetes mellitus with hyperglycemia: Secondary | ICD-10-CM

## 2015-12-26 DIAGNOSIS — J449 Chronic obstructive pulmonary disease, unspecified: Secondary | ICD-10-CM | POA: Diagnosis not present

## 2015-12-26 DIAGNOSIS — E1142 Type 2 diabetes mellitus with diabetic polyneuropathy: Secondary | ICD-10-CM

## 2015-12-26 DIAGNOSIS — I25118 Atherosclerotic heart disease of native coronary artery with other forms of angina pectoris: Secondary | ICD-10-CM | POA: Diagnosis not present

## 2015-12-26 DIAGNOSIS — Z716 Tobacco abuse counseling: Secondary | ICD-10-CM | POA: Diagnosis not present

## 2015-12-26 DIAGNOSIS — Z794 Long term (current) use of insulin: Secondary | ICD-10-CM

## 2015-12-26 DIAGNOSIS — IMO0002 Reserved for concepts with insufficient information to code with codable children: Secondary | ICD-10-CM

## 2015-12-26 NOTE — Patient Outreach (Signed)
[facility]) Care Management  [facility] Care Manager  12/26/2015   Eddie Graves 1931/02/19 [id number]  Subjective:   Objective:   Current Medications:  Current Outpatient Prescriptions  Medication Sig Dispense Refill  . albuterol (PROVENTIL) (2.5 MG/3ML) 0.083% nebulizer solution Take 3 mLs (2.5 mg total) by nebulization every 6 (six) hours as needed for wheezing or shortness of breath. 75 mL 12  . amLODipine (NORVASC) 2.5 MG tablet Take one tablet by mouth once daily for blood pressure 90 tablet 3  . aspirin EC 81 MG tablet Take 1 tablet (81 mg total) by mouth daily. 30 tablet 3  . Cholecalciferol (VITAMIN D) 2000 UNITS CAPS Take 1 capsule by mouth daily. For vitamin D repletion    . CRESTOR 20 MG tablet Take 20 mg by mouth daily. For cholesterol    . esomeprazole (NEXIUM) 40 MG capsule Take one capsule by mouth once daily before breakfast for stomach 30 capsule 11  . etodolac (LODINE) 400 MG tablet Take 400 mg by mouth 2 (two) times daily.    Marland Kitchen GARLIC PO Take 75 mg by mouth daily.     Marland Kitchen glucose blood test strip Use to check blood sugar twice daily. Dx: E11.41 100 each 12  . Insulin Lispro Prot & Lispro ([name] 75/25 KWIKPEN) (75-25) 100 UNIT/ML Kwikpen Inject 36 units into the skin 2 times daily to control blood sugar. 15 mL 11  . levalbuterol (XOPENEX) 1.25 MG/3ML nebulizer solution Take 1.25 mg by nebulization every 4 (four) hours as needed for wheezing. 72 mL 12  . levETIRAcetam (KEPPRA XR) 500 MG 24 hr tablet Take 2 tablets (1,000 mg total) by mouth daily. For seizures 180 tablet 1  . lisinopril (PRINIVIL,ZESTRIL) 10 MG tablet Take one tablet by mouth once daily for blood pressure 30 tablet 5  . metoprolol succinate (TOPROL-XL) 100 MG 24 hr tablet Take one tablet by mouth once daily for blood pressure 90 tablet 3  . Multiple Vitamin (MULITIVITAMIN WITH MINERALS) TABS Take 1 tablet by mouth daily.    . nitroGLYCERIN (NITRODUR - DOSED IN MG/24 HR) 0.4 mg/hr Place  1 patch onto the skin daily. For chest pain    . Omega 3 1200 MG CAPS Take 2,400 mg by mouth daily. For cholesterol    . oxyCODONE (ROXICODONE) 5 MG immediate release tablet Take one tablet by mouth every 6 hours as needed for pain 120 tablet 0  . PROAIR HFA 108 (90 BASE) MCG/ACT inhaler INHALE TWO   PUFFS INTO THE LUNGS EVERY   TWO   HOURS IF NEEDED 18 g 5  . sertraline (ZOLOFT) 50 MG tablet Take 1 tablet (50 mg total) by mouth daily. 30 tablet 3  . TRADJENTA 5 MG TABS tablet TAKE ONE TABLET   BY MOUTH   DAILY   FOR DIABETES 30 tablet 5   No current facility-administered medications for this visit.    Functional Status:  In your present state of health, do you have any difficulty performing the following activities: 12/12/2015 05/19/2015  Hearing? Y N  Vision? Y Y  Difficulty concentrating or making decisions? N N  Walking or climbing stairs? Y N  Dressing or bathing? N N  Doing errands, shopping? [name]  Preparing Food and eating ? N -  Using the Toilet? N -  In the past six months, have you accidently leaked urine? N -  Do you have problems with loss of bowel control? N -  Managing your Medications? N -  Managing your Finances? Y -  Housekeeping or managing your Housekeeping? Y -    Fall/Depression Screening: PHQ 2/9 Scores 12/12/2015 11/26/2015 05/27/2015 11/29/2014 07/24/2013 06/08/2013 03/16/2013  PHQ - 2 Score 1 3 0 0 2 1 3   PHQ- 9 Score 3 11 - - 7 4 6     Assessment: 82 year old referral from the emergency room for transient neurologic deficit. History of  COPD, diastolic heart failure, diabetes. Member with no specific issues or complaints at this time. Although not currently on a diuretic and member with no recent episodes of heart failure, Member request to learn more about heart failure. RNCM discussed heart failure zone tool and the importance of weighing self daily and when to call the doctor. Called [facility] to request talking scale-RNCM call and left message [place]  [place]request social worker speak with member to arrange talking scales. Member does not want to hear audio about heart failure at this time.   History of diabetes-Member denies issues with insulin or blood glucose at this time. Does not want additional education on diabetes.  History of COPD- RNCM reinforced correct use of inhaler. Member returned demonstrated correct use of inhaler. [name] is interested in receiving additional education regarding COPD management from the Health Coach.  Discussed personal care service-community health response program. Member does not want a referral at this time. Reports he is working on a plan for getting someone to go with him to the [facility].  No additional care coordination needs noted at this time.   Plan: Transition to the Health Coach. Update primary care.  Update: Received call from Eddie Fus stating since member lives in [facility], he must speak with the social workers in [place]. [facility] [phone]) or Eddie [place] [phone]). RNCM called to update member, but was unable to reach and unable to leave a voice message.  Eddie Fiscal, RN, MSN, [facility] Community Care Coordinator Cell: [phone]

## 2015-12-26 NOTE — Progress Notes (Signed)
Patient ID: Eddie Graves, male   DOB: [birth year], 82 y.o.   MRN: [id number]   Location: [facility] Provider: Gwenith Spitz. Renato Gails, D.O., C.M.D.  Code Status: full code Goals of Care: Advanced Directive information Does patient have an advance directive?: No, Would patient like information on creating an advanced directive?: No - patient declined information  Chief Complaint  Patient presents with  . Medical Management of Chronic Issues    6 week follow-up for DM, Hypertension  . OTHER    Son with patient, and needs letter  from you to get a  talking weight scale    HPI: Patient is a 82 y.o. male seen in the office today for med mgt of chronic diseases.  He is back to smoking.  3 cigarettes per day.  Sometimes 4.  Will quit and then get back to it after he's been out a couple days.  Does want to quit, but can't resist urge to buy them when he's out at the store.    He is legally blind so he will need a talking weight scale.  He cannot recall who the letter goes to so we will give it to him.    He reports that he was told his appt was at 2:30pm.    DMII:  Still checking his sugar.  Two weeks ago, he got a phone call which he thinks was from the drug store that said he should start taking insulin just once a day not twice a day.  Did continue the 36 units twice a day of humalog mix.  Highest in last 5-6 days was 123.  A couple times before that much higher after eating something sweet.  Lowest 97.  Usually 119-120 am and hs.    COPD:  Breathing better with nebulizer in am and again at bedtime.  If gets a coughing fit in daytime, uses inhaler.  Now knows how to use.    Low back pain:  Not having it lately while using his oxycodone--takes one in am and one at night.  Last filled 12/24/15.    Blood pressure was excellent today.  He reports that his pulse is sometimes around 50.    Review of Systems:  Review of Systems  Constitutional: Negative for fever, chills and malaise/fatigue.    HENT: Negative for congestion.   Respiratory: Negative for cough and shortness of breath.   Cardiovascular: Negative for chest pain.  Gastrointestinal: Negative for abdominal pain.  Genitourinary: Negative for dysuria, urgency and frequency.  Skin: Negative for itching and rash.  Neurological: Negative for dizziness, loss of consciousness and weakness.    Past Medical History  Diagnosis Date  . DM (diabetes mellitus) type II controlled peripheral vascular disorder (HCC)   . Essential hypertension, benign   . Blind   . Headache(784.0)   . Sebaceous cyst   . Chronic airway obstruction, not elsewhere classified   . Cough   . Spinal stenosis, lumbar region, with neurogenic claudication   . Coronary atherosclerosis of native coronary artery   . Vitamin D deficiency   . Rash and other nonspecific skin eruption   . Other and unspecified hyperlipidemia   . Anxiety state, unspecified   . Depressive disorder, not elsewhere classified   . Legal blindness, as defined in [place]   . Reflux esophagitis   . Unspecified constipation   . Pain in joint, site unspecified   . Memory loss   . Palpitations   . Type II diabetes mellitus  with neurological manifestations, uncontrolled (HCC)   . H/O hiatal hernia     Past Surgical History  Procedure Laterality Date  . Coronary angioplasty with stent placement    . Hernia repair    . Eye surgery      right prosthetic globe    Allergies  Allergen Reactions  . Flexeril [Cyclobenzaprine] Other (See Comments)    delirium      Medication List       This list is accurate as of: 12/26/15  4:30 PM.  Always use your most recent med list.               albuterol (2.5 MG/3ML) 0.083% nebulizer solution  Commonly known as:  PROVENTIL  Take 3 mLs (2.5 mg total) by nebulization every 6 (six) hours as needed for wheezing or shortness of breath.     PROAIR HFA 108 (90 Base) MCG/ACT inhaler  Generic drug:  albuterol  INHALE TWO   PUFFS INTO THE  LUNGS EVERY   TWO   HOURS IF NEEDED     amLODipine 2.5 MG tablet  Commonly known as:  NORVASC  Take one tablet by mouth once daily for blood pressure     aspirin EC 81 MG tablet  Take 1 tablet (81 mg total) by mouth daily.     CRESTOR 20 MG tablet  Generic drug:  rosuvastatin  Take 20 mg by mouth daily. For cholesterol     esomeprazole 40 MG capsule  Commonly known as:  NEXIUM  Take one capsule by mouth once daily before breakfast for stomach     etodolac 400 MG tablet  Commonly known as:  LODINE  Take 400 mg by mouth 2 (two) times daily.     GARLIC PO  Take 75 mg by mouth daily.     glucose blood test strip  Use to check blood sugar twice daily. Dx: E11.41     Insulin Lispro Prot & Lispro (75-25) 100 UNIT/ML Kwikpen  Commonly known as:  [name] 75/25 KWIKPEN  Inject 36 units into the skin 2 times daily to control blood sugar.     levalbuterol 1.25 MG/3ML nebulizer solution  Commonly known as:  XOPENEX  Take 1.25 mg by nebulization every 4 (four) hours as needed for wheezing.     levETIRAcetam 500 MG 24 hr tablet  Commonly known as:  KEPPRA XR  Take 2 tablets (1,000 mg total) by mouth daily. For seizures     lisinopril 10 MG tablet  Commonly known as:  PRINIVIL,ZESTRIL  Take one tablet by mouth once daily for blood pressure     metoprolol succinate 100 MG 24 hr tablet  Commonly known as:  TOPROL-XL  Take one tablet by mouth once daily for blood pressure     multivitamin with minerals Tabs tablet  Take 1 tablet by mouth daily.     nitroGLYCERIN 0.4 mg/hr patch  Commonly known as:  NITRODUR - Dosed in mg/24 hr  Place 1 patch onto the skin daily. For chest pain     Omega 3 1200 MG Caps  Take 2,400 mg by mouth daily. For cholesterol     oxyCODONE 5 MG immediate release tablet  Commonly known as:  ROXICODONE  Take one tablet by mouth every 6 hours as needed for pain     sertraline 50 MG tablet  Commonly known as:  ZOLOFT  Take 1 tablet (50 mg total) by  mouth daily.     TRADJENTA 5 MG Tabs  tablet  Generic drug:  linagliptin  TAKE ONE TABLET   BY MOUTH   DAILY   FOR DIABETES     Vitamin D 2000 units Caps  Take 1 capsule by mouth daily. For vitamin D repletion        Health Maintenance  Topic Date Due  . ZOSTAVAX  06/02/1993  . FOOT EXAM  04/27/2015  . OPHTHALMOLOGY EXAM  09/07/2015  . HEMOGLOBIN A1C  05/14/2016  . INFLUENZA VACCINE  07/07/2016  . TETANUS/TDAP  12/30/2021  . PNA vac Low Risk Adult  Completed    Physical Exam: Filed Vitals:   12/26/15 1605  BP: 110/78  Pulse: 70  Temp: 98 F (36.7 C)  TempSrc: Oral  Resp: 20  Height: 5\' 7"  (1.702 m)  Weight: 209 lb 9.6 oz (95.074 kg)  SpO2: 94%   Body mass index is 32.82 kg/(m^2). Physical Exam  Labs reviewed: Basic Metabolic Panel:  Recent Labs  1006 11/06/15 0827 11/14/15 1616  NA 138 137 139  K 4.1 4.6 4.8  CL 99 103 103  CO2 22 27 21   GLUCOSE 159* 144* 88  BUN 21 12 21   CREATININE 1.46* 1.28* 1.33*  CALCIUM 9.6 9.5 9.9   Liver Function Tests:  Recent Labs  08/16/15 1021 08/30/15 1006 11/14/15 1616  AST 22 26 21   ALT 13 18 13   ALKPHOS 110 104 114  BILITOT 0.3 0.3 0.5  PROT 7.2 7.2 6.9  ALBUMIN 4.2 4.2 4.1   No results for input(s): LIPASE, AMYLASE in the last 8760 hours. No results for input(s): AMMONIA in the last 8760 hours. CBC:  Recent Labs  03/19/15 0905 05/19/15 1201 08/30/15 1006 11/06/15 0827  WBC 6.8 7.9 7.8 6.7  NEUTROABS 3.8  --  4.0  --   HGB 12.2* 12.3*  --  13.2  HCT 37.3* 37.6* 39.8 41.2  MCV 90 88.7 84 86.9  PLT  --  201 209 189   Lipid Panel:  Recent Labs  03/19/15 0905 08/30/15 1006  CHOL 124 107  HDL 48 35*  LDLCALC 52 52  TRIG 122 99  CHOLHDL 2.6 3.1   Lab Results  Component Value Date   HGBA1C 7.5* 11/14/2015    Procedures since last visit:  Assessment/Plan 1. Uncontrolled type 2 diabetes mellitus with diabetic polyneuropathy, with long-term current use of insulin (HCC) -has  gradually been improving since actually checking glucose and taking insulin twice a day, cont tradjenta also which has helped -cont home health assistance and same meds  2. Tobacco abuse counseling -he is not really ready to stay off cigarettes--keeps getting back to it   3. Coronary artery disease involving native coronary artery with other forms of angina pectoris (HCC) -no chest pain since last appt, spirits better too -cont secondary prevention of MI and use of ntg prn   4. Essential hypertension, benign -bp actually well controlled today -cont norvasc low dose, lisinopril, metoprolol succinate and monitor  5. COPD bronchitis -cont proair prn, xopenex nebs prn -should probably be on regular inhaler, but requires more education for this  6. Spinal stenosis of lumbar region -cont on prn oxycodone for severe episodes, doing better with pain lately b/c he is mysteriously back on the etodolac I have told him and his son that he must not take AGAIN and AGAIN--noted after the appt -will certainly need f/u bmp again for this  Next appt:  12/31/2015 med mgt   Moishe Schellenberg L. Thinh Cuccaro, D.O. Geriatrics [facility] [address], [place] [address] Cell Phone (Mon-Fri 8am-5pm):  [phone] On Call:  [phone] & follow prompts after 5pm & weekends Office Phone:  [phone] Office Fax:  [phone]

## 2015-12-27 ENCOUNTER — Other Ambulatory Visit: Payer: Self-pay

## 2015-12-27 NOTE — Patient Outreach (Addendum)
[facility]) Care Management  12/27/2015  Eddie Graves [birth year] [id number]  Follow up regarding coordination with [facility]. [name] reports he spoke with someone from [facility], and has followed up with his primary care. [name] reports he has the information/letter requested by the social worker, but is not sure who called and who to send the letter to.  Plan: Leave contact information for [facility] social workers in voice message per member request; transition to health coach.  Kathyrn Sheriff, RN, MSN, [facility] Community Care Coordinator Cell: [phone]

## 2015-12-30 ENCOUNTER — Ambulatory Visit: Payer: Medicare Other | Admitting: Pharmacotherapy

## 2015-12-31 ENCOUNTER — Other Ambulatory Visit: Payer: Self-pay | Admitting: Internal Medicine

## 2016-01-01 ENCOUNTER — Other Ambulatory Visit: Payer: Self-pay | Admitting: *Deleted

## 2016-01-01 DIAGNOSIS — I2510 Atherosclerotic heart disease of native coronary artery without angina pectoris: Secondary | ICD-10-CM | POA: Diagnosis not present

## 2016-01-01 DIAGNOSIS — J449 Chronic obstructive pulmonary disease, unspecified: Secondary | ICD-10-CM | POA: Diagnosis not present

## 2016-01-01 DIAGNOSIS — E1140 Type 2 diabetes mellitus with diabetic neuropathy, unspecified: Secondary | ICD-10-CM | POA: Diagnosis not present

## 2016-01-01 DIAGNOSIS — I10 Essential (primary) hypertension: Secondary | ICD-10-CM | POA: Diagnosis not present

## 2016-01-01 DIAGNOSIS — H540 Blindness, both eyes: Secondary | ICD-10-CM | POA: Diagnosis not present

## 2016-01-01 DIAGNOSIS — E1165 Type 2 diabetes mellitus with hyperglycemia: Secondary | ICD-10-CM | POA: Diagnosis not present

## 2016-01-01 DIAGNOSIS — Z794 Long term (current) use of insulin: Secondary | ICD-10-CM | POA: Diagnosis not present

## 2016-01-01 DIAGNOSIS — M4806 Spinal stenosis, lumbar region: Secondary | ICD-10-CM | POA: Diagnosis not present

## 2016-01-02 DIAGNOSIS — I119 Hypertensive heart disease without heart failure: Secondary | ICD-10-CM | POA: Diagnosis not present

## 2016-01-02 DIAGNOSIS — I2510 Atherosclerotic heart disease of native coronary artery without angina pectoris: Secondary | ICD-10-CM | POA: Diagnosis not present

## 2016-01-02 DIAGNOSIS — E1140 Type 2 diabetes mellitus with diabetic neuropathy, unspecified: Secondary | ICD-10-CM | POA: Diagnosis not present

## 2016-01-02 DIAGNOSIS — E119 Type 2 diabetes mellitus without complications: Secondary | ICD-10-CM | POA: Diagnosis not present

## 2016-01-02 DIAGNOSIS — I208 Other forms of angina pectoris: Secondary | ICD-10-CM | POA: Diagnosis not present

## 2016-01-03 NOTE — Patient Outreach (Addendum)
[facility]) Care Management  01/01/2016   Orion Mole [birth year] [id number]  12/31/25 RN Health Coach  Attempted introduction outreach call to patient.  Patient  Stated that he was talking to someone and could I call back. 01/03/2016 RN Health Coach introduction telephone call to patient.  [employer] compliance verified. RN Health Coach  introduced self to patient. Patient was very agreeable to follow up out reach calls for his COPD. Patient was referred by Kathyrn Sheriff Community RN.  Plan: RN Health Coach will do follow up call within a month for assessment. Patient agreed to follow up call  Gean Maidens BSN RN [facility] Care Management [phone]

## 2016-01-13 ENCOUNTER — Ambulatory Visit (INDEPENDENT_AMBULATORY_CARE_PROVIDER_SITE_OTHER): Payer: Medicare Other | Admitting: Pharmacotherapy

## 2016-01-13 ENCOUNTER — Encounter: Payer: Self-pay | Admitting: Pharmacotherapy

## 2016-01-13 VITALS — BP 150/80 | HR 90 | Temp 98.1°F | Resp 20 | Ht 67.0 in | Wt 210.6 lb

## 2016-01-13 DIAGNOSIS — E1165 Type 2 diabetes mellitus with hyperglycemia: Secondary | ICD-10-CM

## 2016-01-13 DIAGNOSIS — H540 Blindness, both eyes: Secondary | ICD-10-CM | POA: Diagnosis not present

## 2016-01-13 DIAGNOSIS — M4806 Spinal stenosis, lumbar region: Secondary | ICD-10-CM | POA: Diagnosis not present

## 2016-01-13 DIAGNOSIS — Z794 Long term (current) use of insulin: Secondary | ICD-10-CM | POA: Diagnosis not present

## 2016-01-13 DIAGNOSIS — IMO0002 Reserved for concepts with insufficient information to code with codable children: Secondary | ICD-10-CM

## 2016-01-13 DIAGNOSIS — E1140 Type 2 diabetes mellitus with diabetic neuropathy, unspecified: Secondary | ICD-10-CM | POA: Diagnosis not present

## 2016-01-13 DIAGNOSIS — I2510 Atherosclerotic heart disease of native coronary artery without angina pectoris: Secondary | ICD-10-CM | POA: Diagnosis not present

## 2016-01-13 DIAGNOSIS — J449 Chronic obstructive pulmonary disease, unspecified: Secondary | ICD-10-CM | POA: Diagnosis not present

## 2016-01-13 DIAGNOSIS — E1142 Type 2 diabetes mellitus with diabetic polyneuropathy: Secondary | ICD-10-CM | POA: Diagnosis not present

## 2016-01-13 DIAGNOSIS — I10 Essential (primary) hypertension: Secondary | ICD-10-CM | POA: Diagnosis not present

## 2016-01-13 NOTE — Progress Notes (Signed)
  Subjective:    Eddie Graves is a 82 y.o.African American male who presents for follow-up of Type 2 diabetes mellitus.   Last A1C was 7.5% - improved from 8.6%  He is blind.  Son is with him today.  Did not bring blood glucose meter. He reports fasting BG 100-120, same at night. Denies hypoglycemia.  Home BP has been good.  Skin is dry. Having some pain in left heel, and pain in bottom of right foot.  No open areas.  Niece checks feet for him. Some peripheral edema Nocturia 3-4 times per night.  No routine exercise. Struggles with making healthy food choices.  His skips his morning insulin if BG is <120.  Review of Systems A comprehensive review of systems was negative except for: Eyes: positive for blind Cardiovascular: positive for lower extremity edema Genitourinary: positive for nocturia Integument/breast: positive for dry skin    Objective:    BP 150/80 mmHg  Pulse 90  Temp(Src) 98.1 F (36.7 C) (Oral)  Resp 20  Ht 5' 7"$  (1.702 m)  Wt 210 lb 9.6 oz (95.528 kg)  BMI 32.98 kg/m2  SpO2 92%  General:  alert, cooperative and no distress  Oropharynx: normal findings: lips normal without lesions and gums healthy   Eyes:  negative findings: lids and lashes normal and his is blind   Ears:  external ears normal        Lung: clear to auscultation bilaterally  Heart:  regular rate and rhythm     Extremities: edema bilateral lower extremities  Skin: dry     Neuro: mental status, speech normal, alert and oriented x3 and uses cane for guidance   Lab Review GLUCOSE (mg/dL)  Date Value  11/14/2015 88  08/30/2015 159*  08/16/2015 151*   GLUCOSE, BLD (mg/dL)  Date Value  11/06/2015 144*  05/20/2015 121*  05/19/2015 196*   CO2 (mmol/L)  Date Value  11/14/2015 21  11/06/2015 27  08/30/2015 22   BUN (mg/dL)  Date Value  11/14/2015 21  11/06/2015 12  08/30/2015 21  08/16/2015 14  05/20/2015 12  05/19/2015 15   CREATININE, SER (mg/dL)  Date Value   11/14/2015 1.33*  11/06/2015 1.28*  08/30/2015 1.46*       Assessment:    Diabetes Mellitus type II, under fair control. A1C above goal <7% due to missed doses. BP above target <140/90   Plan:    1.  Rx changes: none 2.  Counseled on importance of taking insulin as prescribed.  No missed doses.  If BG is <70 - hold dose and call office for instructions. 3.  Continue [name] 75/25 insulin twice daily.  36 units twice daily. 4.  Continue Tradjenta 47m QD. 5.  BP above target <140/90.  Usually OK.  Last BP was 110/78.  Will continue current RX and monitor. 6.  Counseled on nutrition goals. 7.  Exercise goal is 30-45 minutes 5 x week.

## 2016-01-13 NOTE — Patient Instructions (Signed)
Need routine exercise. No missed doses of insulin.  Call office if BG <70

## 2016-01-14 ENCOUNTER — Telehealth: Payer: Self-pay

## 2016-01-14 ENCOUNTER — Other Ambulatory Visit: Payer: Self-pay | Admitting: *Deleted

## 2016-01-14 DIAGNOSIS — M5030 Other cervical disc degeneration, unspecified cervical region: Secondary | ICD-10-CM

## 2016-01-14 MED ORDER — OXYCODONE HCL 5 MG PO TABS: ORAL_TABLET | ORAL | Status: DC

## 2016-01-14 NOTE — Telephone Encounter (Signed)
Patient requested and will pick up. Narcotic contract printed. 

## 2016-01-14 NOTE — Telephone Encounter (Signed)
Spoke with patient and he is willing to participate but would like for [place] to give him a call back tomorrow when his son is home to help him set everything up

## 2016-01-24 NOTE — Telephone Encounter (Signed)
Left message on voicemail for patient to return call when available   

## 2016-01-28 NOTE — Telephone Encounter (Signed)
Patient states he is already connected with the program and the company ([employer] have already been out and will return soon.

## 2016-01-30 ENCOUNTER — Ambulatory Visit: Payer: Self-pay | Admitting: *Deleted

## 2016-02-14 ENCOUNTER — Other Ambulatory Visit: Payer: Self-pay | Admitting: Internal Medicine

## 2016-02-14 DIAGNOSIS — E1141 Type 2 diabetes mellitus with diabetic mononeuropathy: Secondary | ICD-10-CM | POA: Diagnosis not present

## 2016-02-20 ENCOUNTER — Other Ambulatory Visit: Payer: Self-pay | Admitting: *Deleted

## 2016-02-20 DIAGNOSIS — M5030 Other cervical disc degeneration, unspecified cervical region: Secondary | ICD-10-CM

## 2016-02-20 MED ORDER — OXYCODONE HCL 5 MG PO TABS: ORAL_TABLET | ORAL | Status: DC

## 2016-02-20 NOTE — Telephone Encounter (Signed)
Patient requested and will pick up 

## 2016-02-25 ENCOUNTER — Other Ambulatory Visit: Payer: Self-pay | Admitting: *Deleted

## 2016-03-02 ENCOUNTER — Encounter: Payer: Self-pay | Admitting: Pharmacotherapy

## 2016-03-02 ENCOUNTER — Other Ambulatory Visit: Payer: Self-pay | Admitting: Internal Medicine

## 2016-03-05 ENCOUNTER — Encounter: Payer: Self-pay | Admitting: Internal Medicine

## 2016-03-05 ENCOUNTER — Ambulatory Visit (INDEPENDENT_AMBULATORY_CARE_PROVIDER_SITE_OTHER): Payer: Medicare Other | Admitting: Internal Medicine

## 2016-03-05 VITALS — BP 112/70 | HR 76 | Temp 97.8°F | Resp 20 | Ht 67.0 in | Wt 210.0 lb

## 2016-03-05 DIAGNOSIS — K5900 Constipation, unspecified: Secondary | ICD-10-CM | POA: Insufficient documentation

## 2016-03-05 DIAGNOSIS — M51369 Other intervertebral disc degeneration, lumbar region without mention of lumbar back pain or lower extremity pain: Secondary | ICD-10-CM

## 2016-03-05 DIAGNOSIS — R109 Unspecified abdominal pain: Secondary | ICD-10-CM | POA: Insufficient documentation

## 2016-03-05 DIAGNOSIS — E1142 Type 2 diabetes mellitus with diabetic polyneuropathy: Secondary | ICD-10-CM

## 2016-03-05 DIAGNOSIS — E1165 Type 2 diabetes mellitus with hyperglycemia: Secondary | ICD-10-CM | POA: Diagnosis not present

## 2016-03-05 DIAGNOSIS — Z794 Long term (current) use of insulin: Secondary | ICD-10-CM | POA: Diagnosis not present

## 2016-03-05 DIAGNOSIS — Z716 Tobacco abuse counseling: Secondary | ICD-10-CM | POA: Insufficient documentation

## 2016-03-05 DIAGNOSIS — M5136 Other intervertebral disc degeneration, lumbar region: Secondary | ICD-10-CM

## 2016-03-05 DIAGNOSIS — IMO0002 Reserved for concepts with insufficient information to code with codable children: Secondary | ICD-10-CM | POA: Insufficient documentation

## 2016-03-05 DIAGNOSIS — K5909 Other constipation: Secondary | ICD-10-CM | POA: Diagnosis not present

## 2016-03-05 DIAGNOSIS — I10 Essential (primary) hypertension: Secondary | ICD-10-CM | POA: Diagnosis not present

## 2016-03-05 DIAGNOSIS — E119 Type 2 diabetes mellitus without complications: Secondary | ICD-10-CM | POA: Insufficient documentation

## 2016-03-05 NOTE — Progress Notes (Signed)
Patient ID: Eddie Graves, male   DOB: 1931/03/25, 83 y.o.   MRN: [id number]   Location:  [facility] clinic Provider:  Hershall Benkert L. Renato Gails, D.O., C.M.D.  Code Status: full code Goals of Care:  Advanced Directives 12/26/2015  Does patient have an advance directive? No  Would patient like information on creating an advanced directive? No - patient declined information   Chief Complaint  Patient presents with  . Medical Management of Chronic Issues    hurting on right side     HPI: Patient is a 82 y.o. male seen today for medical management of chronic diseases.    He is c/o pain on his right side.  Wakes up in the morning and it hurst in his back around to the right part of his stomach.  After he showers and eats breakfast, it starts to feel better, but his stomach and right side of his back feel sore.  Tosses and turns at night.  Sleeps in recliner much of the time.  He doesn't think that the recliner is the problem.  Doesn't use a pillow at all.  Got a new mattress 4-5 mos ago.  This began just 2 wks ago.    Forgot his dentures.  Had been napping.    Still smoking cigarettes.  Diet worse again b/c his son does not cook anymore and gets him fast food.  Stools are hard lately.  Has not been drinking enough water.    Past Medical History  Diagnosis Date  . DM (diabetes mellitus) type II controlled peripheral vascular disorder (HCC)   . Essential hypertension, benign   . Blind   . Headache(784.0)   . Sebaceous cyst   . Chronic airway obstruction, not elsewhere classified   . Cough   . Spinal stenosis, lumbar region, with neurogenic claudication   . Coronary atherosclerosis of native coronary artery   . Vitamin D deficiency   . Rash and other nonspecific skin eruption   . Other and unspecified hyperlipidemia   . Anxiety state, unspecified   . Depressive disorder, not elsewhere classified   . Legal blindness, as defined in [place]   . Reflux esophagitis   . Unspecified constipation   . Pain in  joint, site unspecified   . Memory loss   . Palpitations   . Type II diabetes mellitus with neurological manifestations, uncontrolled (HCC)   . H/O hiatal hernia     Past Surgical History  Procedure Laterality Date  . Coronary angioplasty with stent placement    . Hernia repair    . Eye surgery      right prosthetic globe    Allergies  Allergen Reactions  . Flexeril [Cyclobenzaprine] Other (See Comments)    delirium      Medication List       This list is accurate as of: 03/05/16  4:01 PM.  Always use your most recent med list.               amLODipine 2.5 MG tablet  Commonly known as:  NORVASC  Take one tablet by mouth once daily for blood pressure     aspirin EC 81 MG tablet  Take 1 tablet (81 mg total) by mouth daily.     CRESTOR 20 MG tablet  Generic drug:  rosuvastatin  Take 20 mg by mouth daily. For cholesterol     esomeprazole 40 MG capsule  Commonly known as:  NEXIUM  Take one capsule by mouth once daily before breakfast for  stomach     etodolac 400 MG tablet  Commonly known as:  LODINE  TAKE ONE TABLET   BY MOUTH   TWICE A DAY     GARLIC PO  Take 75 mg by mouth daily.     glucose blood test strip  Use to check blood sugar twice daily. Dx: E11.41     [name] 75/25 KWIKPEN (75-25) 100 UNIT/ML Kwikpen  Generic drug:  Insulin Lispro Prot & Lispro  INJECT 36 UNITS INTO THE SKIN TWICE A DAY     levalbuterol 1.25 MG/3ML nebulizer solution  Commonly known as:  XOPENEX  Take 1.25 mg by nebulization every 4 (four) hours as needed for wheezing.     levETIRAcetam 500 MG 24 hr tablet  Commonly known as:  KEPPRA XR  TAKE TWO   TABLETS BY MOUTH   DAILY     lisinopril 10 MG tablet  Commonly known as:  PRINIVIL,ZESTRIL  TAKE ONE TABLET   BY MOUTH   DAILY   FOR BLOOD PRESSURE     metoprolol succinate 100 MG 24 hr tablet  Commonly known as:  TOPROL-XL  Take one tablet by mouth once daily for blood pressure     multivitamin with minerals Tabs tablet   Take 1 tablet by mouth daily.     nitroGLYCERIN 0.4 mg/hr patch  Commonly known as:  NITRODUR - Dosed in mg/24 hr  Place 1 patch onto the skin daily. For chest pain     Omega 3 1200 MG Caps  Take 2,400 mg by mouth daily. For cholesterol     oxyCODONE 5 MG immediate release tablet  Commonly known as:  ROXICODONE  Take one tablet by mouth every 6 hours as needed for pain     PROAIR HFA 108 (90 Base) MCG/ACT inhaler  Generic drug:  albuterol  INHALE TWO   PUFFS INTO THE LUNGS EVERY   TWO   HOURS IF NEEDED     sertraline 50 MG tablet  Commonly known as:  ZOLOFT  TAKE ONE TABLET   BY MOUTH   DAILY     TRADJENTA 5 MG Tabs tablet  Generic drug:  linagliptin  TAKE ONE TABLET   BY MOUTH   DAILY   FOR DIABETES     Vitamin D 2000 units Caps  Take 1 capsule by mouth daily. For vitamin D repletion       Review of Systems:  Review of Systems  Constitutional: Negative for fever and chills.  HENT: Negative for congestion and hearing loss.   Eyes:       Legally blind, prosthetic eye, uses walking stick  Respiratory: Negative for cough and shortness of breath.   Cardiovascular: Negative for chest pain and leg swelling.  Gastrointestinal: Negative for abdominal pain.  Genitourinary: Negative for dysuria, urgency and frequency.  Musculoskeletal: Positive for myalgias and back pain. Negative for falls.       Right side pain in am  Skin: Negative for itching and rash.  Neurological: Positive for seizures. Negative for dizziness.       No recent seizures  Endo/Heme/Allergies: Does not bruise/bleed easily.  Psychiatric/Behavioral: Positive for memory loss. Negative for depression.       Moods much better    Health Maintenance  Topic Date Due  . ZOSTAVAX  06/02/1993  . FOOT EXAM  04/27/2015  . OPHTHALMOLOGY EXAM  09/07/2015  . HEMOGLOBIN A1C  05/14/2016  . INFLUENZA VACCINE  07/07/2016  . TETANUS/TDAP  12/30/2021  . PNA  vac Low Risk Adult  Completed    Physical Exam: Filed  Vitals:   03/05/16 1553  BP: 112/70  Pulse: 76  Temp: 97.8 F (36.6 C)  TempSrc: Oral  Resp: 20  Height: 5\' 7"  (1.702 m)  Weight: 210 lb (95.255 kg)  SpO2: 92%   Body mass index is 32.88 kg/(m^2). Physical Exam  Constitutional: He is oriented to person, place, and time. He appears well-developed and well-nourished. No distress.  Cardiovascular: Normal rate, regular rhythm, normal heart sounds and intact distal pulses.   Pulmonary/Chest: Breath sounds normal. No respiratory distress.  Abdominal: Soft. Bowel sounds are normal. He exhibits no distension and no mass. There is no tenderness.  Musculoskeletal: Normal range of motion. He exhibits no edema or tenderness.  Neurological: He is alert and oriented to person, place, and time.  Skin: Skin is warm and dry.  Psychiatric: He has a normal mood and affect.    Labs reviewed: Basic Metabolic Panel:  Recent Labs  1006 11/06/15 0827 11/14/15 1616  NA 138 137 139  K 4.1 4.6 4.8  CL 99 103 103  CO2 22 27 21   GLUCOSE 159* 144* 88  BUN 21 12 21   CREATININE 1.46* 1.28* 1.33*  CALCIUM 9.6 9.5 9.9   Liver Function Tests:  Recent Labs  08/16/15 1021 08/30/15 1006 11/14/15 1616  AST 22 26 21   ALT 13 18 13   ALKPHOS 110 104 114  BILITOT 0.3 0.3 0.5  PROT 7.2 7.2 6.9  ALBUMIN 4.2 4.2 4.1   No results for input(s): LIPASE, AMYLASE in the last 8760 hours. No results for input(s): AMMONIA in the last 8760 hours. CBC:  Recent Labs  03/19/15 0905 05/19/15 1201 08/30/15 1006 11/06/15 0827  WBC 6.8 7.9 7.8 6.7  NEUTROABS 3.8  --  4.0  --   HGB 12.2* 12.3*  --  13.2  HCT 37.3* 37.6* 39.8 41.2  MCV 90 88.7 84 86.9  PLT  --  201 209 189   Lipid Panel:  Recent Labs  03/19/15 0905 08/30/15 1006  CHOL 124 107  HDL 48 35*  LDLCALC 52 52  TRIG 122 99  CHOLHDL 2.6 3.1   Lab Results  Component Value Date   HGBA1C 7.5* 11/14/2015    Assessment/Plan 1. Controlled type 2 diabetes mellitus with diabetic  polyneuropathy, with long-term current use of insulin (HCC) --doing better lately by hba1c, but needs to eat healthier diet--this has fallen off even more lately as his son is not cooking -cont same insulin, tradjenta, lisinopril, baby asa, crestor  2. Essential hypertension, benign -bp at goal today with current medications w/o dizziness so cont same regimen  3. Tobacco abuse counseling -continues to smoke and seems he is not ready to quit  4. Right sided abdominal pain -seems muscular -advised to put heat over the area (warm compress) and monitor -if persists or worsens, call back  5. Other constipation -due to pain meds, inactivity -encouraged him to drink more fluids  6. Lumbar degenerative disc disease -I have discontinued his Lodine about 100 times, but he keeps getting more from the pharmacy  Labs/tests ordered:  Labs done today:  Cmp, hba1c Next appt:  04/06/2016   Jese Comella L. Jeanene Mena, D.O. Geriatrics 11/08/15 [facility] [address] [place], [place] [employer] Cell Phone (Mon-Fri 8am-5pm):  [phone] On Call:  [phone] & follow prompts after 5pm & weekends Office Phone:  [phone] Office Fax:  [phone]

## 2016-03-06 LAB — COMPREHENSIVE METABOLIC PANEL
ALT: 9 IU/L (ref 0–44)
AST: 29 IU/L (ref 0–40)
Albumin/Globulin Ratio: 1.4 (ref 1.2–2.2)
Albumin: 4.2 g/dL (ref 3.5–4.7)
Alkaline Phosphatase: 115 IU/L (ref 39–117)
BUN/Creatinine Ratio: 7 — ABNORMAL LOW (ref 10–22)
BUN: 9 mg/dL (ref 8–27)
Bilirubin Total: 0.5 mg/dL (ref 0.0–1.2)
CO2: 24 mmol/L (ref 18–29)
Calcium: 9.5 mg/dL (ref 8.6–10.2)
Chloride: 101 mmol/L (ref 96–106)
Creatinine, Ser: 1.27 mg/dL (ref 0.76–1.27)
GFR calc Af Amer: 60 mL/min/{1.73_m2} (ref >59)
GFR calc non Af Amer: 52 mL/min/{1.73_m2} — ABNORMAL LOW (ref >59)
Globulin, Total: 2.9 g/dL (ref 1.5–4.5)
Glucose: 92 mg/dL (ref 65–99)
Potassium: 5.1 mmol/L (ref 3.5–5.2)
Sodium: 142 mmol/L (ref 134–144)
Total Protein: 7.1 g/dL (ref 6.0–8.5)

## 2016-03-06 LAB — HEMOGLOBIN A1C
Est. average glucose Bld gHb Est-mCnc: 171 mg/dL
Hgb A1c MFr Bld: 7.6 % — ABNORMAL HIGH (ref 4.8–5.6)

## 2016-03-16 ENCOUNTER — Other Ambulatory Visit: Payer: Self-pay | Admitting: Internal Medicine

## 2016-03-24 ENCOUNTER — Other Ambulatory Visit: Payer: Self-pay

## 2016-03-24 ENCOUNTER — Telehealth: Payer: Self-pay

## 2016-03-24 DIAGNOSIS — M5030 Other cervical disc degeneration, unspecified cervical region: Secondary | ICD-10-CM

## 2016-03-24 MED ORDER — OXYCODONE HCL 5 MG PO TABS: ORAL_TABLET | ORAL | Status: DC

## 2016-03-24 NOTE — Telephone Encounter (Signed)
Patient called to ask for his pain medication to be refilled script printed.

## 2016-03-24 NOTE — Telephone Encounter (Signed)
Patient called to have his pain medication refilled printed script put for signing by Dr. Chilton Si, patient says he will pick it up tomorrow.

## 2016-04-06 ENCOUNTER — Ambulatory Visit: Payer: Medicare Other | Admitting: Pharmacotherapy

## 2016-04-06 ENCOUNTER — Encounter: Payer: Self-pay | Admitting: Internal Medicine

## 2016-04-08 ENCOUNTER — Other Ambulatory Visit: Payer: Medicare Other

## 2016-04-10 ENCOUNTER — Other Ambulatory Visit: Payer: Medicare Other

## 2016-04-13 ENCOUNTER — Ambulatory Visit: Payer: Self-pay | Admitting: Pharmacotherapy

## 2016-04-23 DIAGNOSIS — E119 Type 2 diabetes mellitus without complications: Secondary | ICD-10-CM | POA: Diagnosis not present

## 2016-04-23 DIAGNOSIS — E785 Hyperlipidemia, unspecified: Secondary | ICD-10-CM | POA: Diagnosis not present

## 2016-04-23 DIAGNOSIS — M1990 Unspecified osteoarthritis, unspecified site: Secondary | ICD-10-CM | POA: Diagnosis not present

## 2016-04-23 DIAGNOSIS — I119 Hypertensive heart disease without heart failure: Secondary | ICD-10-CM | POA: Diagnosis not present

## 2016-04-23 DIAGNOSIS — I25118 Atherosclerotic heart disease of native coronary artery with other forms of angina pectoris: Secondary | ICD-10-CM | POA: Diagnosis not present

## 2016-04-24 ENCOUNTER — Other Ambulatory Visit: Payer: Self-pay | Admitting: *Deleted

## 2016-04-24 ENCOUNTER — Other Ambulatory Visit: Payer: Self-pay | Admitting: Internal Medicine

## 2016-04-24 ENCOUNTER — Other Ambulatory Visit: Payer: Medicare Other

## 2016-04-24 MED ORDER — LINAGLIPTIN 5 MG PO TABS: ORAL_TABLET | ORAL | Status: DC

## 2016-04-24 NOTE — Telephone Encounter (Signed)
[facility] 

## 2016-04-29 ENCOUNTER — Other Ambulatory Visit: Payer: Self-pay | Admitting: *Deleted

## 2016-04-29 DIAGNOSIS — M5030 Other cervical disc degeneration, unspecified cervical region: Secondary | ICD-10-CM

## 2016-04-29 MED ORDER — OXYCODONE HCL 5 MG PO TABS: ORAL_TABLET | ORAL | Status: DC

## 2016-04-29 NOTE — Telephone Encounter (Signed)
Patient requested and will pick up 

## 2016-05-06 DIAGNOSIS — E1141 Type 2 diabetes mellitus with diabetic mononeuropathy: Secondary | ICD-10-CM | POA: Diagnosis not present

## 2016-05-11 ENCOUNTER — Encounter: Payer: Self-pay | Admitting: Pharmacotherapy

## 2016-05-11 ENCOUNTER — Ambulatory Visit (INDEPENDENT_AMBULATORY_CARE_PROVIDER_SITE_OTHER): Payer: Medicare Other | Admitting: Pharmacotherapy

## 2016-05-11 ENCOUNTER — Telehealth: Payer: Self-pay

## 2016-05-11 VITALS — BP 138/74 | HR 73 | Temp 97.8°F | Ht 67.0 in | Wt 213.0 lb

## 2016-05-11 DIAGNOSIS — I10 Essential (primary) hypertension: Secondary | ICD-10-CM

## 2016-05-11 DIAGNOSIS — E1142 Type 2 diabetes mellitus with diabetic polyneuropathy: Secondary | ICD-10-CM

## 2016-05-11 DIAGNOSIS — Z794 Long term (current) use of insulin: Secondary | ICD-10-CM

## 2016-05-11 NOTE — Telephone Encounter (Signed)
Patient was in office to see Ronal Fear and requested rx for diabetic shoes. Patient states he was given a RX before and his grandson misplaced it. Order placed, printed, and given to Dr.Reed to sign.

## 2016-05-11 NOTE — Progress Notes (Signed)
  Subjective:    Eddie Graves is a 82 y.o.African American male who presents for follow-up of Type 2 diabetes mellitus.   Last A1C 7.6% (03/05/16).  Was 7.5% Nail salon no longer willing to do pedicures due to his diabetes.  On 75/25 Mix - 36 units twice daily. He misses his insulin frequently.  Overall feet look good except toenails too long.  Did not bring meter with him.   Self reports BG: 70-120 range  Checking before before breakfast and supper. Denies hypoglycemia.  Struggles with making healthy food choices. No routine exercise. He is blind. Nocturia 3-4 times per night. Denies dysuria  Some peripheral neuropathy.    Review of Systems A comprehensive review of systems was negative except for: Eyes: positive for blind Cardiovascular: positive for lower extremity edema Genitourinary: positive for nocturia Musculoskeletal: positive for rare pains in feet    Objective:    BP 138/74 mmHg  Pulse 73  Temp(Src) 97.8 F (36.6 C) (Oral)  Ht 5\' 7"  (1.702 m)  Wt 213 lb (96.616 kg)  BMI 33.35 kg/m2  SpO2 94%  General:  alert, cooperative and no distress  Oropharynx: normal findings: lips normal without lesions and gums healthy   Eyes:  negative findings: lids and lashes normal, positive findings: blind   Ears:  external ears normal        Lung: clear to auscultation bilaterally  Heart:  regular rate and rhythm     Extremities: edema bilateral lower extremities  Skin: dry     Neuro: mental status, speech normal, alert and oriented x3   Lab Review GLUCOSE (mg/dL)  Date Value  92  11/14/2015 88  08/30/2015 159*   GLUCOSE, BLD (mg/dL)  Date Value  09/01/2015 144*  05/20/2015 121*  05/19/2015 196*   CO2 (mmol/L)  Date Value  03/05/2016 24  11/14/2015 21  11/06/2015 27   BUN (mg/dL)  Date Value  11/08/2015 9  11/14/2015 21  11/06/2015 12  08/30/2015 21  05/20/2015 12  05/19/2015 15   CREATININE, SER (mg/dL)  Date Value  07/19/2015  1.27  11/14/2015 1.33*  11/06/2015 1.28*       Assessment:    Diabetes Mellitus type II, under good control. A1C at goal <8% BP at goal <140/90   Plan:    1.  Rx changes: none 2.  Continue Humalog 75/25 Mix 36 units twice daily. 3.  Counseled on foot care - will refer to podiatry. 4.  Counseled on nutrition goals. 5.  Counseled on need for routine exercise.  Goal is 30-45 minutes 5 x week. 6.  BP at goal <140/90 7.  Complaining of back pain - needs to follow up with Dr. 11/08/2015.

## 2016-05-11 NOTE — Patient Instructions (Signed)
Need to see podiatrist for toenail care.

## 2016-06-01 ENCOUNTER — Ambulatory Visit (INDEPENDENT_AMBULATORY_CARE_PROVIDER_SITE_OTHER): Payer: Medicare Other | Admitting: Internal Medicine

## 2016-06-01 ENCOUNTER — Encounter: Payer: Self-pay | Admitting: Internal Medicine

## 2016-06-01 ENCOUNTER — Encounter: Payer: Self-pay | Admitting: *Deleted

## 2016-06-01 VITALS — BP 148/80 | HR 77 | Temp 98.2°F | Wt 213.0 lb

## 2016-06-01 DIAGNOSIS — Z794 Long term (current) use of insulin: Secondary | ICD-10-CM | POA: Diagnosis not present

## 2016-06-01 DIAGNOSIS — M51369 Other intervertebral disc degeneration, lumbar region without mention of lumbar back pain or lower extremity pain: Secondary | ICD-10-CM

## 2016-06-01 DIAGNOSIS — Z716 Tobacco abuse counseling: Secondary | ICD-10-CM

## 2016-06-01 DIAGNOSIS — H9193 Unspecified hearing loss, bilateral: Secondary | ICD-10-CM

## 2016-06-01 DIAGNOSIS — F329 Major depressive disorder, single episode, unspecified: Secondary | ICD-10-CM | POA: Diagnosis not present

## 2016-06-01 DIAGNOSIS — E1142 Type 2 diabetes mellitus with diabetic polyneuropathy: Secondary | ICD-10-CM | POA: Diagnosis not present

## 2016-06-01 DIAGNOSIS — M5136 Other intervertebral disc degeneration, lumbar region: Secondary | ICD-10-CM | POA: Diagnosis not present

## 2016-06-01 DIAGNOSIS — M5030 Other cervical disc degeneration, unspecified cervical region: Secondary | ICD-10-CM | POA: Diagnosis not present

## 2016-06-01 DIAGNOSIS — F32A Depression, unspecified: Secondary | ICD-10-CM

## 2016-06-01 MED ORDER — OXYCODONE HCL 5 MG PO TABS: ORAL_TABLET | ORAL | Status: DC

## 2016-06-01 NOTE — Progress Notes (Signed)
Location:  [facility] clinic Provider:  Ivah Girardot L. Renato Gails, D.O., C.M.D.  Code Status: DNR Goals of Care:  Advanced Directives 06/01/2016  Does patient have an advance directive? No  Would patient like information on creating an advanced directive? -  importance of completing advance directive has been discussed, but has not been done.   Chief Complaint  Patient presents with  . Medical Management of Chronic Issues    follow-up    HPI: Patient is a 82 y.o. Graves seen today for medical management of chronic diseases.    He comes today requesting etodolac which has repetitively been the cause of his renal failure worsening.  He's also had prior GI bleeding.  We have discussed again and again that it is not safe for him, and he keeps getting it on autofill or from Dr. Myrtie Neither (he has now retired).  He also wants his percocet refilled.    He is depressed and has thought of self-harm.  He is on zoloft 50mg  at this time.  He now tells me he said he has not thought of harming himself yet.  Is taking his antidepressant.  Nurse was filling her pillbox, but it's been two months since she came again.  She could no longer come out.    Neck and back bothering him more.  He's out of both the etodolac and percocet.  [place] [facility] was giving him back injections.  Is having headaches again.  Neck is ready for another epidural injection.    Hands painful.  Has pain at MCP region and having trigger fingers.  Has been soaking his hands in hot water--works for the whole day.    Got a bp cuff and it speaks a non-english language.  They can't find the button to change the language.    sometimes still having problems checking his sugar.  No problems with taking his insulin.    He has hearing loss--it's getting worse when he is on the phone especially.  He's afraid he'll miss something important.  Went to audiology about 2 years ago.  Was off of [place].    Notes his pulse is 40s to 70s.  Knows  it's low from the bp cuff.  New cuff was supposed to give him more info.   Tobacco abuse:  Still smoking.  Nicotine addict--he admits to it.  Says he tries hard and it bothers him.  Remembers the one time when he quit for 8 years.   Does want to quit.   However, a few months ago, his son was out of town and he had no one to get them for him so he called a cab to bring him some.   Past Medical History  Diagnosis Date  . DM (diabetes mellitus) type II controlled peripheral vascular disorder (HCC)   . Essential hypertension, benign   . Blind   . Headache(784.0)   . Sebaceous cyst   . Chronic airway obstruction, not elsewhere classified   . Cough   . Spinal stenosis, lumbar region, with neurogenic claudication   . Coronary atherosclerosis of native coronary artery   . Vitamin D deficiency   . Rash and other nonspecific skin eruption   . Other and unspecified hyperlipidemia   . Anxiety state, unspecified   . Depressive disorder, not elsewhere classified   . Legal blindness, as defined in 04[birth year]04-1969   . Reflux esophagitis   . Unspecified constipation   . Pain in joint, site unspecified   .  Memory loss   . Palpitations   . Type II diabetes mellitus with neurological manifestations, uncontrolled (HCC)   . H/O hiatal hernia     Past Surgical History  Procedure Laterality Date  . Coronary angioplasty with stent placement    . Hernia repair    . Eye surgery      right prosthetic globe    Allergies  Allergen Reactions  . Nsaids Other (See Comments)    Renal failure  . Flexeril [Cyclobenzaprine] Other (See Comments)    delirium      Medication List       This list is accurate as of: 06/01/16  3:14 PM.  Always use your most recent med list.               amLODipine 2.5 MG tablet  Commonly known as:  NORVASC  Take one tablet by mouth once daily for blood pressure     aspirin EC 81 MG tablet  Take 1 tablet (81 mg total) by mouth daily.     CRESTOR 20 MG tablet  Generic drug:   rosuvastatin  Take 20 mg by mouth daily. For cholesterol     esomeprazole 40 MG capsule  Commonly known as:  NEXIUM  Take one capsule by mouth once daily before breakfast for stomach     GARLIC PO  Take 75 mg by mouth daily.     glucose blood test strip  Use to check blood sugar twice daily. Dx: E11.41     [name] 75/25 KWIKPEN (75-25) 100 UNIT/ML Kwikpen  Generic drug:  Insulin Lispro Prot & Lispro  INJECT 36 UNITS INTO THE SKIN TWICE A DAY     levalbuterol 1.25 MG/3ML nebulizer solution  Commonly known as:  XOPENEX  Take 1.25 mg by nebulization every 4 (four) hours as needed for wheezing.     levETIRAcetam 500 MG 24 hr tablet  Commonly known as:  KEPPRA XR  TAKE TWO   TABLETS BY MOUTH   DAILY     linagliptin 5 MG Tabs tablet  Commonly known as:  TRADJENTA  Take one tablet by mouth once daily to control blood sugar     lisinopril 10 MG tablet  Commonly known as:  PRINIVIL,ZESTRIL  TAKE ONE TABLET   BY MOUTH   DAILY   FOR BLOOD PRESSURE     metoprolol succinate 100 MG 24 hr tablet  Commonly known as:  TOPROL-XL  Take one tablet by mouth once daily for blood pressure     multivitamin with minerals Tabs tablet  Take 1 tablet by mouth daily.     nitroGLYCERIN 0.4 mg/hr patch  Commonly known as:  NITRODUR - Dosed in mg/24 hr  Place 1 patch onto the skin daily. For chest pain     Omega 3 1200 MG Caps  Take 2,400 mg by mouth daily. For cholesterol     oxyCODONE 5 MG immediate release tablet  Commonly known as:  ROXICODONE  Take one tablet by mouth every 6 hours as needed for pain     PROAIR HFA 108 (90 Base) MCG/ACT inhaler  Generic drug:  albuterol  INHALE TWO   PUFFS INTO THE LUNGS EVERY   TWO   HOURS IF NEEDED     sertraline 50 MG tablet  Commonly known as:  ZOLOFT  TAKE ONE TABLET   BY MOUTH   DAILY     Vitamin D 2000 units Caps  Take 1 capsule by mouth daily. For vitamin D  repletion        Review of Systems:  Review of Systems  Constitutional:  Negative for fever, chills and malaise/fatigue.  HENT: Negative for congestion.   Eyes:       Blind, has prosthetic eye  Respiratory: Negative for cough and shortness of breath.   Cardiovascular: Negative for chest pain, palpitations and leg swelling.  Gastrointestinal: Negative for abdominal pain, constipation, blood in stool and melena.  Genitourinary: Negative for dysuria.  Musculoskeletal: Positive for back pain and neck pain. Negative for falls.       Neck, lower back  Skin: Negative for rash.  Neurological: Negative for dizziness, loss of consciousness and weakness.  Endo/Heme/Allergies:       Diabetes  Psychiatric/Behavioral: Positive for depression and memory loss.    Health Maintenance  Topic Date Due  . ZOSTAVAX  06/02/1993  . OPHTHALMOLOGY EXAM  09/07/2015  . INFLUENZA VACCINE  07/07/2016  . HEMOGLOBIN A1C  09/05/2016  . FOOT EXAM  05/11/2017  . TETANUS/TDAP  12/30/2021  . PNA vac Low Risk Adult  Completed    Physical Exam: Filed Vitals:   06/01/16 1501  BP: 148/80  Pulse: 77  Temp: 98.2 F (36.8 C)  TempSrc: Oral  Weight: 213 lb (96.616 kg)  SpO2: 98%   Body mass index is 33.35 kg/(m^2). Physical Exam  Constitutional: He is oriented to person, place, and time. He appears well-developed and well-nourished. No distress.  Cardiovascular: Normal rate, regular rhythm, normal heart sounds and intact distal pulses.   Pulmonary/Chest: Effort normal and breath sounds normal. No respiratory distress.  Abdominal: Soft. Bowel sounds are normal.  Musculoskeletal: Normal range of motion. He exhibits tenderness.  Neck paravertebral muscles; lower back down left leg  Neurological: He is alert and oriented to person, place, and time.  Skin: Skin is warm and dry.  Psychiatric: He has a normal mood and affect.    Labs reviewed: Basic Metabolic Panel:  Recent Labs  54/56/25 0827 11/14/15 1616 03/05/16 1643  NA 137 139 142  K 4.6 4.8 5.1  CL 103 103 101  CO2 27  21 24   GLUCOSE 144* 88 92  BUN 12 21 9   CREATININE 1.28* 1.33* 1.27  CALCIUM 9.5 9.9 9.5   Liver Function Tests:  Recent Labs  08/30/15 1006 11/14/15 1616 03/05/16 1643  AST 26 21 29   ALT 18 13 9   ALKPHOS 104 114 115  BILITOT 0.3 0.5 0.5  PROT 7.2 6.9 7.1  ALBUMIN 4.2 4.1 4.2   No results for input(s): LIPASE, AMYLASE in the last 8760 hours. No results for input(s): AMMONIA in the last 8760 hours. CBC:  Recent Labs  08/30/15 1006 11/06/15 0827  WBC 7.8 6.7  NEUTROABS 4.0  --   HGB  --  13.2  HCT 39.8 41.2  MCV 84 86.9  PLT 209 189   Lipid Panel:  Recent Labs  08/30/15 1006  CHOL 107  HDL 35*  LDLCALC 52  TRIG 99  CHOLHDL 3.1   Lab Results  Component Value Date   HGBA1C 7.6* 03/05/2016    Procedures since last visit: No results found.  Assessment/Plan 1. Degenerative disc disease, cervical - worse now off etodolac, but pt has diabetic kidney disease and each time he's back on it, his renal function worsens - resume oxycodone--narcotic contract was reviewed with him fully by CMA and signed - oxyCODONE (ROXICODONE) 5 MG immediate release tablet; Take one tablet by mouth every 6 hours as needed for pain  Dispense:  120 tablet; Refill: 0 - DG Epidurography; Future--has gotten several cervical spine epidurals that were initially ordered by Dr. Montez Morita so he requested it be ordered  2. Hearing loss, bilateral - no cerumen impaction noted - Ambulatory referral to Audiology for hearing testing  3. Tobacco abuse counseling -ongoing--says he wants to quit but goes well out of his way to get cigarettes so clearly not ready despite risks  4. Lumbar degenerative disc disease -resume oxycodone  5. Controlled type 2 diabetes mellitus with diabetic polyneuropathy, with long-term current use of insulin (HCC) -cont mixed insulin to decrease frequency of injections, cont cbgs  6. Depression -requests to see psychiatry so names of three offices given -cont  zoloft  Note that once again there are issues with his medications being put in pillboxes--nurse is no longer coming monthly and his son isn't doing it so it's unclear what he's really taking.  Will send a note to [facility] about this.  Not sure why a blind man can't get continuous help with his CBG checks which he struggles with and with pillbox filling (nor why his family won't step up and help if they'll buy him cigarettes.)  Labs/tests ordered:   Orders Placed This Encounter  Procedures  . DG Epidurography    Standing Status: Future     Number of Occurrences:      Standing Expiration Date: 08/01/2017    Order Specific Question:  Reason for Exam (SYMPTOM  OR DIAGNOSIS REQUIRED)    Answer:  cervical spine DDD (previously got injections ordered through Dr. Montez Morita)    Order Specific Question:  Preferred imaging location?    Answer:  [id number]  . Ambulatory referral to Audiology    Referral Priority:  Routine    Referral Type:  Audiology Exam    Referral Reason:  Specialty Services Required    Number of Visits Requested:  1   Next appt:  08/12/2016   Zackory Pudlo L. Grayson Pfefferle, D.O. Geriatrics [employer] [address], [place] [address] Cell Phone (Mon-Fri 8am-5pm):  [phone] On Call:  [phone] & follow prompts after 5pm & weekends Office Phone:  [phone] Office Fax:  [phone]

## 2016-06-11 DIAGNOSIS — J310 Chronic rhinitis: Secondary | ICD-10-CM | POA: Diagnosis not present

## 2016-06-11 DIAGNOSIS — H903 Sensorineural hearing loss, bilateral: Secondary | ICD-10-CM | POA: Diagnosis not present

## 2016-06-16 ENCOUNTER — Inpatient Hospital Stay
Admission: RE | Admit: 2016-06-16 | Discharge: 2016-06-16 | Disposition: A | Discharge status: Home / Self Care | Payer: Self-pay | Source: Ambulatory Visit | Attending: Internal Medicine | Admitting: Internal Medicine

## 2016-06-16 ENCOUNTER — Ambulatory Visit (INDEPENDENT_AMBULATORY_CARE_PROVIDER_SITE_OTHER): Payer: Self-pay | Admitting: Podiatry

## 2016-06-16 ENCOUNTER — Other Ambulatory Visit: Payer: Self-pay | Admitting: Internal Medicine

## 2016-06-16 ENCOUNTER — Other Ambulatory Visit: Payer: Self-pay | Admitting: *Deleted

## 2016-06-16 ENCOUNTER — Other Ambulatory Visit: Payer: Self-pay | Admitting: Pharmacist

## 2016-06-16 DIAGNOSIS — M5030 Other cervical disc degeneration, unspecified cervical region: Secondary | ICD-10-CM

## 2016-06-16 NOTE — Progress Notes (Signed)
   Subjective:    Patient ID: Eddie Graves, male    DOB: [birth year], 82 y.o.   MRN: [id number]  HPI  No show  Review of Systems     Objective:   Physical Exam        Assessment & Plan:

## 2016-06-16 NOTE — Patient Outreach (Signed)
[facility]) Care Management  06/16/2016  Eddie Graves 05/01/1931 [id number]  Patient was referred to [facility] by his PCP, Dr Ewell Poe for medication review as there was some concern from PCP that patient may not have all of his medications.    Placed call to patient and initially left a HIPAA compliant message for patient, who then returned call.  Verified patient's HIPAA details and explained purpose of call.   Based on call, it was apparent that patient may benefit from a pharmacy home visit---offered this to patient he was very appreciative and accepted.   Plan:  Home visit scheduled to review medications with patient.   Tommye Standard, PharmD, [facility] Clinical Pharmacist [facility] [phone]

## 2016-06-16 NOTE — Discharge Instructions (Signed)

## 2016-06-16 NOTE — Patient Outreach (Signed)
Referral received from care management assistant via MD office for [facility] assistance with management of diabetes and blood sugar checks.  Pharmacy also referred for medication management.  According to chart, member has history of heart failure, COPD, seizures, chronic kidney disease, hypertension, and he is blind.    Call placed to perform telephone assessment and to schedule initial home visit, no answer at preferred number.  HIPAA compliant voice message left.  Will await call back.  If no call back, will make second attempt to contact later this week.  Kemper Durie, [place], MSN [facility] Care Management  [facility] Manager [phone]

## 2016-06-19 ENCOUNTER — Other Ambulatory Visit: Payer: Self-pay | Admitting: *Deleted

## 2016-06-19 ENCOUNTER — Other Ambulatory Visit: Payer: Self-pay | Admitting: Pharmacist

## 2016-06-19 ENCOUNTER — Encounter: Payer: Self-pay | Admitting: Pharmacist

## 2016-06-19 NOTE — Patient Outreach (Signed)
Second attempt made to contact member to perform telephone assessment, no answer.  HIPAA compliant voice message left, will await call back.  Will make third attempt next week.  Call then placed to K. Ruedinger, [facility] pharmacist, as he was scheduled to make home visit today, to inquire about best contact.  Mr. Inda Coke states that the member's listed preferred number is how he was able to make contact, although he had to leave messages and have member call him back.  Will continue to await call back.  Kemper Durie, [place], MSN [facility] Care Management  [facility] Manager [phone]

## 2016-06-19 NOTE — Patient Outreach (Addendum)
[facility]) Care Management  [facility] Pharmacy   06/19/2016  Eddie Graves 06-06-1931 [id number]  Subjective:  Patient is an 82 year old male who was referred to [facility] by his PCP, Dr Ewell Poe, for concerns about medication management.    Initially showed up to patient's residence to no answer at his door and left a HIPAA compliant message on his voicemail.  Waited for 10 minutes and then left due to no answer.  Patient returned call about 20 minutes later and requested that if pharmacist, could to come back out to complete home visit.   Home visit was completed with patient at his place of residence.  Patient verified HIPAA details.  Patient reports that he is still getting his maintenance medications from [facility] and that they are filled in pill boxes and delivered to him.    He reports his concern today is his OTC items which are not in the pill boxes and his blood pressure monitor which he states is not working.   He is interested in getting his Aspirin, vitamin D, and multi-vitamin in the pill boxes.    Patient showed this pharmacist one (7) day pill planner that he uses from his pharmacy.  He did not show this pharmacist prescription bottles, except for oxycodone and nitroglycerin patches.  He reports he fills oxycodone and nitroglycerin patches at [facility] on [address].   Objective:   Encounter Medications: Outpatient Encounter Prescriptions as of 06/19/2016  Medication Sig Note  . amLODipine (NORVASC) 2.5 MG tablet Take one tablet by mouth once daily for blood pressure   . aspirin EC 81 MG tablet Take 1 tablet (81 mg total) by mouth daily.   . Cholecalciferol (VITAMIN D) 2000 UNITS CAPS Take 1 capsule by mouth daily. For vitamin D repletion   . CRESTOR 20 MG tablet Take 20 mg by mouth daily. For cholesterol 10/11/2014: .  . esomeprazole (NEXIUM) 40 MG capsule Take one capsule by mouth once daily before breakfast for stomach   .  GARLIC PO Take 7,322 mcg by mouth daily.    Marland Kitchen [name] 75/25 KWIKPEN (75-25) 100 UNIT/ML Kwikpen INJECT 36 UNITS INTO THE SKIN TWICE A DAY   . levalbuterol (XOPENEX) 1.25 MG/3ML nebulizer solution Take 1.25 mg by nebulization every 4 (four) hours as needed for wheezing.   . levETIRAcetam (KEPPRA XR) 500 MG 24 hr tablet TAKE TWO   TABLETS BY MOUTH   DAILY   . linagliptin (TRADJENTA) 5 MG TABS tablet Take one tablet by mouth once daily to control blood sugar   . lisinopril (PRINIVIL,ZESTRIL) 10 MG tablet TAKE ONE TABLET   BY MOUTH   DAILY   FOR BLOOD PRESSURE   . metoprolol succinate (TOPROL-XL) 100 MG 24 hr tablet Take one tablet by mouth once daily for blood pressure   . Multiple Vitamin (MULITIVITAMIN WITH MINERALS) TABS Take 1 tablet by mouth daily.   . nitroGLYCERIN (NITRODUR - DOSED IN MG/24 HR) 0.4 mg/hr Place 1 patch onto the skin daily. For chest pain   . Omega 3 1200 MG CAPS Take 2,400 mg by mouth daily. For cholesterol   . oxyCODONE (ROXICODONE) 5 MG immediate release tablet Take one tablet by mouth every 6 hours as needed for pain 06/19/2016: Patient reports he uses 1 pill about twice daily.    Marland Kitchen PROAIR HFA 108 (90 Base) MCG/ACT inhaler INHALE TWO   PUFFS INTO THE LUNGS EVERY   TWO  HOURS IF NEEDED 06/19/2016: Patient reports he uses 1 puff about every 6-8 hours  . sertraline (ZOLOFT) 50 MG tablet TAKE ONE TABLET   BY MOUTH   DAILY   . glucose blood test strip Use to check blood sugar twice daily. Dx: E11.41    No facility-administered encounter medications on file as of 06/19/2016.    Functional Status: In your present state of health, do you have any difficulty performing the following activities: 12/12/2015  Hearing? Y  Vision? Y  Difficulty concentrating or making decisions? N  Walking or climbing stairs? Y  Dressing or bathing? N  Doing errands, shopping? Y  Preparing Food and eating ? N  Using the Toilet? N  In the past six months, have you accidently leaked urine? N  Do  you have problems with loss of bowel control? N  Managing your Medications? N  Managing your Finances? Y  Housekeeping or managing your Housekeeping? Y    Fall/Depression Screening: PHQ 2/9 Scores 06/01/2016 03/05/2016 12/12/2015 11/26/2015 05/27/2015 11/29/2014 07/24/2013  PHQ - 2 Score 2 0 1 3 0 0 2  PHQ- 9 Score 5 - 3 11 - - 7    Assessment:  Medication management:  Patient's current system of filling maintenance medications at [facility] who then fills pill planners and delivers, seems to work for him per his report.   Referral from patient's PCP stated patient is not to use etodolac secondary to his renal function.  Counseled patient on this fact and asked if he had etodolac in his possession---he stated no, and did not show this pharmacist an etodolac prescription bottle.    Placed a call to [facility] to see about the possibility of having OTC items placed in the pill planners---pharmacy states that with a prescription, they would be able to do this.  Blood Pressure monitor:  Patient had two blood pressure cuffs for his machine, his old cuff appeared to not work.  When his new blood pressure cuff was used, his machine appeared to function.  Patient was able to place blood pressure cuff and work blood pressure machine as it is a talking machine.    Drugs sorted by system:  Neurologic/Psychologic: -levetiracetam XR -sertraline  Cardiovascular: -amlodipine -aspirin 81 mg -lisinopril -metoprolol succinate -nitroglycerin patch -rosuvastatin  Pulmonary/Allergy: -albuterol inhaler -levalbuterol nebulization   Gastrointestinal: -esomeprazole   Endocrine: -[name] 75/25 -linagliptin  Pain: -oxycodone IR  Vitamins/Minerals: -vitamin D -multivitamin   Miscellaneous: -fish oil -garlic    Medications to avoid in the elderly:  Esomeprazole (increased risk of fractures and C. Diff)  Other issues noted:  Patient reports he is using levalbuterol nebulizer and  albuterol inhaler daily---consider if maintenance inhaler is needed for patient's COPD.    Plan:  1) Will route note and barriers letter to patient's PCP.    If patient is to be on aspirin, vitamin D, and multivitamin daily, suggest issuing a prescription to [facility] so they may be added to his monthly pill planner.    2) Will place a follow-up call to patient within the next 2 weeks to evaluate if a follow-up home visit is needed.    Tommye Standard, PharmD, [facility] Clinical Pharmacist [facility] [phone]

## 2016-06-22 ENCOUNTER — Telehealth: Payer: Self-pay | Admitting: *Deleted

## 2016-06-22 ENCOUNTER — Other Ambulatory Visit: Payer: Self-pay | Admitting: *Deleted

## 2016-06-22 MED ORDER — VITAMIN D 50 MCG (2000 UT) PO CAPS: 1.0000 | ORAL_CAPSULE | Freq: Every day | ORAL | Status: DC

## 2016-06-22 NOTE — Telephone Encounter (Signed)
Dee,    Please send in the Rx for Vitamin D 2000 units po daily to [facility]. He was not on asa due to bleeding in his brain in the past and GI bleeding before, as well, and he has polypharmacy adherence issues so would d/c the multivitamin to decrease pill burden.    TReed     ----- Message -----     From: Darreld Mclean, RPH     Sent: 06/19/2016  4:54 PM      To: Kermit Balo, DO        Dr Renato Gails:        Please see attached regarding [facility] Pharmacist home visit. Good news is he reports he is getting his maintenance medications filled and delivered in pill planners from [facility]. He is confused with his OTCs.        If patient is to be on aspirin, vitamin D and multi-vitamin, would suggest sending prescription to [facility] who said they could then place in pill planners.         Thanks.        Tommye Standard, PharmD, [facility]    Clinical Pharmacist    [facility]    [phone]

## 2016-06-22 NOTE — Telephone Encounter (Signed)
rx sent to pharmacy by e-script  

## 2016-06-22 NOTE — Patient Outreach (Signed)
Third attempt made to contact member to schedule initial home visit, this time successful.  He agrees to home visit next week, denies any urgent concerns.  He confirms that [facility] pharmacist, K. Ruedinger, completed a home visit on Friday and addressed physician's medication concerns.  Will proceed with home visit next week.  Kemper Durie, [place], MSN [facility] Care Management  [facility] Manager [phone]

## 2016-06-25 ENCOUNTER — Ambulatory Visit
Admission: RE | Admit: 2016-06-25 | Discharge: 2016-06-25 | Disposition: A | Discharge status: Home / Self Care | Payer: Medicare Other | Source: Ambulatory Visit | Attending: Internal Medicine | Admitting: Internal Medicine

## 2016-06-25 DIAGNOSIS — M5030 Other cervical disc degeneration, unspecified cervical region: Secondary | ICD-10-CM

## 2016-06-25 DIAGNOSIS — M5023 Other cervical disc displacement, cervicothoracic region: Secondary | ICD-10-CM | POA: Diagnosis not present

## 2016-06-25 MED ORDER — IOPAMIDOL (ISOVUE-M 300) INJECTION 61%
1.0000 mL | Freq: Once | INTRAMUSCULAR | Status: AC | PRN
  Administered 2016-06-25: 1 mL via EPIDURAL

## 2016-06-25 MED ORDER — TRIAMCINOLONE ACETONIDE 40 MG/ML IJ SUSP (RADIOLOGY)
60.0000 mg | Freq: Once | INTRAMUSCULAR | Status: AC
  Administered 2016-06-25: 60 mg via EPIDURAL

## 2016-06-25 NOTE — Discharge Instructions (Signed)

## 2016-06-26 ENCOUNTER — Other Ambulatory Visit: Payer: Self-pay | Admitting: Pharmacist

## 2016-06-26 NOTE — Patient Outreach (Signed)
[facility]) Care Management  06/26/2016  Eddie Graves Mar 18, 1931 [id number]  Placed a call to follow-up with patient about his medication adherence.  Informed patient that his PCP, Dr Eddie Graves, issued a prescription for vitamin D to his local pharmacy this week in hopes that pharmacy will then place it in his pill planners at next fill.    Patient states that his pill planners were just delivered last week.  Also discussed with patient that his PCP would like him to stop aspirin for now per the below inbasket message from his PCP.    RE: medication question  Received: Today    Eddie Alroy Dust, DO  Eddie Graves, RPH           Yes, let's keep him off the aspirin. We did neglect to remove it. His med list is always so messed up when he comes from getting autofills on old meds from other docs that I can't keep up. It takes me the whole visit to fix his list. So glad you're helping out. He and his son are usually unaware of what is going on with his meds.        Previous Messages     ----- Message -----   From: Eddie Graves, RPH   Sent: 06/26/2016  8:53 AM    To: Eddie Balo, DO  Subject: medication question                Dr Eddie Graves:   I am one of the [facility] Pharmacists. Was reviewing patient's chart and noticed the refill note from 06/22/16 said he was not on aspirin due to bleeding in the past. Just wanted to confirm with you that you do not want patient taking aspirin 81 mg as it is on his medication list and he is taking it at home based on home visit last week.     Thanks for your time.    Eddie Graves       Patient verbalized understanding. Offered a home visit with patient to review his new pill planners and he prefers to wait until next delivery which he states he will be about 3 weeks.   He denies medication questions at this time and reports he is taking his medications from his pill planners.    Plan:  Place a call to  patient in about 2 weeks to schedule next home visit per patient request.   Eddie Graves, PharmD, [facility] Clinical Pharmacist [facility] [phone]

## 2016-06-29 ENCOUNTER — Other Ambulatory Visit: Payer: Self-pay | Admitting: *Deleted

## 2016-06-29 ENCOUNTER — Encounter: Payer: Self-pay | Admitting: *Deleted

## 2016-06-29 NOTE — Patient Outreach (Signed)
[facility]) Care Management   06/29/2016  Eddie Graves July 11, 1931 [id number]  Eddie Graves is an 82 y.o. male  Subjective:   Member reports that he is "not doing too good today."  He states that he is suffering from "old age problems."  He denies pain or discomfort at this time, denies the need for evaluation at ED or PCP office.    He state that he has been taking all of his medications as prescribed, including his insulin, but has not been able to use his nebulizer machine.  He state that the tube will no longer attach to the back of the machine.  Upon looking at the machine, it is noted that a piece of equipment is broken off and stuck in the tubing.    Member report that he has had trouble checking his blood sugar independently. He state that his grandson comes over everyday and checks it in the afternoon, but he has no one to help check it in the mornings.    Objective:   Review of Systems  Constitutional: Negative.   HENT: Negative.   Eyes: Negative.   Respiratory: Positive for wheezing.   Cardiovascular: Negative.   Gastrointestinal: Negative.   Genitourinary: Negative.   Musculoskeletal: Negative.   Skin: Negative.   Neurological: Negative.   Endo/Heme/Allergies: Negative.   Psychiatric/Behavioral: Positive for depression.    Physical Exam  Constitutional: He is oriented to person, place, and time. He appears well-developed and well-nourished.  Neck: Normal range of motion.  Cardiovascular: Normal rate, regular rhythm and normal heart sounds.   Respiratory: Effort normal and breath sounds normal.  GI: Soft. Bowel sounds are normal.  Musculoskeletal: Normal range of motion.  Neurological: He is alert and oriented to person, place, and time.  Skin: Skin is warm and dry.    BP (!) 152/78 (BP Location: Left Arm, Patient Position: Sitting, Cuff Size: Normal)   Pulse 81   Resp 20   Ht 1.676 m (_0 )   Wt 211 lb (95.7 kg)   SpO2 97%   BMI 34.06  kg/m    Encounter Medications:   Outpatient Encounter Prescriptions as of 06/29/2016  Medication Sig Note  . amLODipine (NORVASC) 2.5 MG tablet Take one tablet by mouth once daily for blood pressure   . aspirin EC 81 MG tablet Take 1 tablet (81 mg total) by mouth daily. 06/26/2016: Stopped by PCP per 06/22/16 chart note.    . Cholecalciferol (VITAMIN D) 2000 units CAPS Take 1 capsule (2,000 Units total) by mouth daily. For vitamin D repletion   . CRESTOR 20 MG tablet Take 20 mg by mouth daily. For cholesterol 10/11/2014: .  . esomeprazole (NEXIUM) 40 MG capsule Take one capsule by mouth once daily before breakfast for stomach   . GARLIC PO Take 1,914 mcg by mouth daily.    Marland Kitchen glucose blood test strip Use to check blood sugar twice daily. Dx: E11.41   . [name] 75/25 KWIKPEN (75-25) 100 UNIT/ML Kwikpen INJECT 36 UNITS INTO THE SKIN TWICE A DAY   . levalbuterol (XOPENEX) 1.25 MG/3ML nebulizer solution Take 1.25 mg by nebulization every 4 (four) hours as needed for wheezing.   . levETIRAcetam (KEPPRA XR) 500 MG 24 hr tablet TAKE TWO   TABLETS BY MOUTH   DAILY   . linagliptin (TRADJENTA) 5 MG TABS tablet Take one tablet by mouth once daily to control blood sugar   . lisinopril (PRINIVIL,ZESTRIL) 10 MG tablet TAKE ONE TABLET  BY MOUTH   DAILY   FOR BLOOD PRESSURE   . metoprolol succinate (TOPROL-XL) 100 MG 24 hr tablet Take one tablet by mouth once daily for blood pressure   . nitroGLYCERIN (NITRODUR - DOSED IN MG/24 HR) 0.4 mg/hr Place 1 patch onto the skin daily. For chest pain   . Omega 3 1200 MG CAPS Take 2,400 mg by mouth daily. For cholesterol   . oxyCODONE (ROXICODONE) 5 MG immediate release tablet Take one tablet by mouth every 6 hours as needed for pain 06/19/2016: Patient reports he uses 1 pill about twice daily.    Marland Kitchen PROAIR HFA 108 (90 Base) MCG/ACT inhaler INHALE TWO   PUFFS INTO THE LUNGS EVERY   TWO   HOURS IF NEEDED 06/19/2016: Patient reports he uses 1 puff about every 6-8 hours  .  sertraline (ZOLOFT) 50 MG tablet TAKE ONE TABLET   BY MOUTH   DAILY    No facility-administered encounter medications on file as of 06/29/2016.     Functional Status:   In your present state of health, do you have any difficulty performing the following activities: 06/29/2016 12/12/2015  Hearing? Tempie Donning  Vision? Y Y  Difficulty concentrating or making decisions? Y N  Walking or climbing stairs? N Y  Dressing or bathing? N N  Doing errands, shopping? Tempie Donning  Preparing Food and eating ? Y N  Using the Toilet? N N  In the past six months, have you accidently leaked urine? N N  Do you have problems with loss of bowel control? N N  Managing your Medications? Y N  Managing your Finances? Tempie Donning  Housekeeping or managing your Housekeeping? Tempie Donning  Some recent data might be hidden    Fall/Depression Screening:    PHQ 2/9 Scores 06/29/2016 06/01/2016 03/05/2016 12/12/2015 11/26/2015 05/27/2015 11/29/2014  PHQ - 2 Score 4 2 0 1 3 0 0  PHQ- 9 Score 4 5 - 3 11 - -   Fall Risk  06/29/2016 06/01/2016 05/11/2016 03/05/2016 01/13/2016  Falls in the past year? Yes No No No No  Number falls in past yr: 1 - - - -  Injury with Fall? No - - - -  Risk for fall due to : - - - - -  Follow up Falls prevention discussed - - - -     Assessment:    Met with member at scheduled time, grandson present for most of visit, son Christia Reading arrives during visit, able to provide input.    Call placed to [facility] regarding nebulizer equipment, son notified that member has to take the machine to the retail store to have it switched out.  He is also advised that if no new supplies are provided, member will need a new prescription from his PCP for supplies.  Son verbalizes understanding.    Discussed having family come over earlier in the morning to check blood sugars more often, member state he has already asked his family, and they have told him that they are not able to come earlier.  As of right now, he is only able to have his  blood sugar checked once/day.  He reports his blood sugar today being greater than 200, but also state that it is expected since he just had an injection in his neck last week.  He reports his normal range being 80-150.    He expresses concern regarding his meals.  He state that breakfast is "ok" and that he receives meals  on wheels for lunch.  His concern is his dinner.  He report that for dinner he has to depend on his family.  He state that they usually bring some form of fast food or either he has to fix himself a sandwich.  He state that he daughter provides a cooked dinner maybe once a week.  Member has been educated on proper diet, but state that he has to eat what he is provided by his family.  Member discusses frustration regarding being independent for so long and now having to depend on people to provide assistance.  He state that he does not like to ask for assistance, and hate to be a bother to his family.    He states that he has discussed his depression with his PCP, and the topic of seeing a psychiatrist was discussed, but he was not ready at that time.  He state that he may be willing to reconsider.    He denies any further there concerns at this time.  Provided with contact information, advised to contact with questions.  Card with contact information left for member and son.  Plan:   Will follow up with member within 2 weeks to confirm he has functioning nebulizer machine.  If no further concerns are expressed, will close case.  Will also discuss option for health coach at that time.  Will provide PCP with update on member's current status, and his concern regarding further evaluation/treatment for depression.  [facility] CM Care Plan Problem One   Flowsheet Row Most Recent Value  Care Plan Problem One  Risk for admission for COPD complications related to broken equipment (nebulizer) as evidenced by member's statement of concern   Role Documenting the Problem One  Care Management  [place] for Problem One  Active  [facility] CM Short Term Goal #1 (0-30 days)  Member will have new nebulizer machine within the next 2 weeks  [facility] CM Short Term Goal #1 Start Date  06/29/16  Interventions for Short Term Goal #1  Call placed to [facility] to obtain directions to repair broken machine.  Son provided detailed information on how to obtain replacement equipment.     Valente David, [place], MSN [facility] [phone]

## 2016-07-02 ENCOUNTER — Other Ambulatory Visit: Payer: Self-pay | Admitting: *Deleted

## 2016-07-02 DIAGNOSIS — M5030 Other cervical disc degeneration, unspecified cervical region: Secondary | ICD-10-CM

## 2016-07-02 MED ORDER — OXYCODONE HCL 5 MG PO TABS: ORAL_TABLET | ORAL | 0 refills | Status: DC

## 2016-07-02 NOTE — Telephone Encounter (Signed)
Left message on machine that rx is ready for pick-up, and it will be at our front desk.  

## 2016-07-02 NOTE — Telephone Encounter (Signed)
Patient requested and will pick up 

## 2016-07-08 ENCOUNTER — Ambulatory Visit: Payer: Medicare Other | Admitting: Podiatry

## 2016-07-09 ENCOUNTER — Ambulatory Visit: Payer: Self-pay | Admitting: Pharmacist

## 2016-07-10 ENCOUNTER — Other Ambulatory Visit: Payer: Self-pay | Admitting: Pharmacist

## 2016-07-10 NOTE — Patient Outreach (Signed)
[facility]) Care Management  07/10/2016  Leavy Heatherly 1931-04-17 [id number]  Unsuccessful attempt to reach patient today to follow-up his weekly medication pill planners.    No answer, HIPAA compliant message left requesting a return call.   Plan:  Will make another attempt to reach patient next week if no return call.   Tommye Standard, PharmD, [facility] Clinical Pharmacist [facility] [phone]

## 2016-07-15 ENCOUNTER — Other Ambulatory Visit: Payer: Self-pay | Admitting: Pharmacist

## 2016-07-15 NOTE — Patient Outreach (Signed)
[facility]) Care Management  07/15/2016  Eddie Graves Mar 20, 1931 [id number]  Placed a call to patient today to follow-up if he received his medication pill box refills from his local pharmacy.    Patient verified HIPAA details and states that he has not gotten his refills yet but will probably next week.    Offered to schedule a follow-up home visit to go over pill planners and patient declined at this time.  He states he would prefer a call next week to see if his pill planners are delivered and consider if he is interested in a home visit at that time.   Plan:  Will place a follow-up call to patient next week.   Tommye Standard, PharmD, [facility] Clinical Pharmacist [facility] [phone]

## 2016-07-17 ENCOUNTER — Other Ambulatory Visit: Payer: Self-pay | Admitting: *Deleted

## 2016-07-17 NOTE — Patient Outreach (Signed)
[facility]) Care Management  07/17/2016  Eddie Graves 1931/11/09 [id number]   Call placed to member to follow up on current health status and nebulizer machine.  He state that he has been having trouble checking his blood sugar over the past couple weeks, but report that he has been successful the past few days.  He state that it was 97 today and 105 yesterday.  He continues to deny having the support in the home to help him with monitoring.  He report that he still has not had his nebulizer machine replaced/repaired.  He state that he has asked his son on several occasions and he is told that it will be done tomorrow, but "tomorrow never comes."    He expresses frustration about the lack of support, but also state that he has no other friends/family that are able to help him.  He state that the son is around and help "when he feel like it."  Discussed the option of having a personal care aide, he state that he has been denied Medicaid and that he is unable to pay out of pocket for such expenses.  Option of assisted living or senior living discussed, he state that is "absoultely out of the question."  He apologizes for being "rude" and not willing to accept the assistance being offered, but state that he is not the type of person to ask for any help.  Discussed with member the concern regarding him to adequately care for himself, he verbalizes understanding but again refuses to accept assistance.  He does agree to another home visit to assess for any other needs that [facility] is able to assist with.  Will follow up within the next 2 weeks, if no additional needs are expressed and he is unreceptive to resources and assistance provided by [facility], will close case.  Kemper Durie, [place], MSN [facility] Care Management  [facility] Manager [phone]

## 2016-07-20 ENCOUNTER — Other Ambulatory Visit: Payer: Self-pay | Admitting: Internal Medicine

## 2016-07-22 ENCOUNTER — Other Ambulatory Visit: Payer: Self-pay | Admitting: Pharmacist

## 2016-07-22 NOTE — Patient Outreach (Signed)
[facility]) Care Management  07/22/2016  Eddie Graves 03-27-1931 [id number]  Call placed to patient to follow-up his medication management needs.   Patient verified his HIPAA details. He states that his monthly pill planners were received from [facility] yesterday.  Offered a home visit to patient to review his new pill planners and he declined at this time stating that he thinks his son may visit him to review the pill planners.   Patient express frustration that he isn't sure when his son will come over to help him.   Based on review of [facility] RN [facility], Maxine Glenn [address]'s note from 07/17/16, patient may have expressed similar feelings to her.   Plan:  Patient prefers a phone call early next week to see if his son reviewed his pill planners and denied offer of home visit at this time.   Will consider case closure if patient is no longer interested in pharmacy home visit, or denies further medication management needs at that time.    Tommye Standard, PharmD, [facility] Clinical Pharmacist [facility] [phone]

## 2016-07-23 ENCOUNTER — Other Ambulatory Visit: Payer: Self-pay | Admitting: Cardiology

## 2016-07-23 DIAGNOSIS — I25119 Atherosclerotic heart disease of native coronary artery with unspecified angina pectoris: Secondary | ICD-10-CM | POA: Diagnosis not present

## 2016-07-23 DIAGNOSIS — R079 Chest pain, unspecified: Secondary | ICD-10-CM

## 2016-07-23 DIAGNOSIS — M1990 Unspecified osteoarthritis, unspecified site: Secondary | ICD-10-CM | POA: Diagnosis not present

## 2016-07-23 DIAGNOSIS — E119 Type 2 diabetes mellitus without complications: Secondary | ICD-10-CM | POA: Diagnosis not present

## 2016-07-23 DIAGNOSIS — E785 Hyperlipidemia, unspecified: Secondary | ICD-10-CM | POA: Diagnosis not present

## 2016-07-23 DIAGNOSIS — I10 Essential (primary) hypertension: Secondary | ICD-10-CM | POA: Diagnosis not present

## 2016-07-24 NOTE — Addendum Note (Signed)
Addended by: Lodema Hong MESHELL A on: 07/24/2016 02:33 PM   Modules accepted: Orders

## 2016-07-27 ENCOUNTER — Other Ambulatory Visit: Payer: Self-pay | Admitting: Pharmacist

## 2016-07-27 ENCOUNTER — Ambulatory Visit (INDEPENDENT_AMBULATORY_CARE_PROVIDER_SITE_OTHER): Payer: Medicare Other | Admitting: Sports Medicine

## 2016-07-27 ENCOUNTER — Encounter: Payer: Self-pay | Admitting: Sports Medicine

## 2016-07-27 DIAGNOSIS — E1142 Type 2 diabetes mellitus with diabetic polyneuropathy: Secondary | ICD-10-CM

## 2016-07-27 DIAGNOSIS — M79672 Pain in left foot: Secondary | ICD-10-CM | POA: Diagnosis not present

## 2016-07-27 DIAGNOSIS — M79671 Pain in right foot: Secondary | ICD-10-CM

## 2016-07-27 DIAGNOSIS — B351 Tinea unguium: Secondary | ICD-10-CM

## 2016-07-27 NOTE — Patient Instructions (Signed)
Diabetes and Foot Care Diabetes may cause you to have problems because of poor blood supply (circulation) to your feet and legs. This may cause the skin on your feet to become thinner, break easier, and heal more slowly. Your skin may become dry, and the skin may peel and crack. You may also have nerve damage in your legs and feet causing decreased feeling in them. You may not notice minor injuries to your feet that could lead to infections or more serious problems. Taking care of your feet is one of the most important things you can do for yourself.  HOME CARE INSTRUCTIONS  Wear shoes at all times, even in the house. Do not go barefoot. Bare feet are easily injured.  Check your feet daily for blisters, cuts, and redness. If you cannot see the bottom of your feet, use a mirror or ask someone for help.  Wash your feet with warm water (do not use hot water) and mild soap. Then pat your feet and the areas between your toes until they are completely dry. Do not soak your feet as this can dry your skin.  Apply a moisturizing lotion or petroleum jelly (that does not contain alcohol and is unscented) to the skin on your feet and to dry, brittle toenails. Do not apply lotion between your toes.  Trim your toenails straight across. Do not dig under them or around the cuticle. File the edges of your nails with an emery board or nail file.  Do not cut corns or calluses or try to remove them with medicine.  Wear clean socks or stockings every day. Make sure they are not too tight. Do not wear knee-high stockings since they may decrease blood flow to your legs.  Wear shoes that fit properly and have enough cushioning. To break in new shoes, wear them for just a few hours a day. This prevents you from injuring your feet. Always look in your shoes before you put them on to be sure there are no objects inside.  Do not cross your legs. This may decrease the blood flow to your feet.  If you find a minor scrape,  cut, or break in the skin on your feet, keep it and the skin around it clean and dry. These areas may be cleansed with mild soap and water. Do not cleanse the area with peroxide, alcohol, or iodine.  When you remove an adhesive bandage, be sure not to damage the skin around it.  If you have a wound, look at it several times a day to make sure it is healing.  Do not use heating pads or hot water bottles. They may burn your skin. If you have lost feeling in your feet or legs, you may not know it is happening until it is too late.  Make sure your health care provider performs a complete foot exam at least annually or more often if you have foot problems. Report any cuts, sores, or bruises to your health care provider immediately. SEEK MEDICAL CARE IF:   You have an injury that is not healing.  You have cuts or breaks in the skin.  You have an ingrown nail.  You notice redness on your legs or feet.  You feel burning or tingling in your legs or feet.  You have pain or cramps in your legs and feet.  Your legs or feet are numb.  Your feet always feel cold. SEEK IMMEDIATE MEDICAL CARE IF:   There is increasing redness,   swelling, or pain in or around a wound.  There is a red line that goes up your leg.  Pus is coming from a wound.  You develop a fever or as directed by your health care provider.  You notice a bad smell coming from an ulcer or wound.   This information is not intended to replace advice given to you by your health care provider. Make sure you discuss any questions you have with your health care provider.   Document Released: 11/20/2000 Document Revised: 07/26/2013 Document Reviewed: 05/02/2013 Elsevier Interactive Patient Education 2016 Elsevier Inc.  

## 2016-07-27 NOTE — Patient Outreach (Signed)
[facility]) Care Management  07/27/2016  Eddie Graves Nov 21, 1931 [id number]  0939:  Unsuccessful outreach attempt to follow-up with patient to see if he has any further medication management needs at this time.   No answer on patient's phone, HIPAA compliant voicemail left requesting a return call.   1497:  Patient returned call and verified his HIPAA details.   Patient states 'things are going great as of now."   He states that his son reviewed his pill planners from [employer] for him.  Patient declines offer of [facility] CM Pharmacist for a follow-up home visit. Patient denies medication related questions/concerns at this time.   He states he plans to continue filling his medications at his current pharmacy as they fill his weekly pill planners for him.   Plan:  Will close pharmacy case as patient denies questions/concerns at this time and declines a follow-up pharmacy home visit.   Will in-basket [facility] RN community care coordinator Kemper Durie to make her aware of pharmacy case closure.   Will route note to PCP, Dr Ewell Poe, to make her aware of [facility] Pharm[facility] case closure.   Tommye Standard, PharmD, [facility] Clinical Pharmacist [facility] [phone]

## 2016-07-27 NOTE — Progress Notes (Signed)
Subjective: Eddie Graves is a 82 y.o. male patient with history of diabetes who presents to office today complaining of long, painful nails  while ambulating in shoes; unable to trim. Patient is blind. Patient states that the glucose reading this morning was 142 mg/dl. Diagnosed 20 years ago. Patient denies any new changes in medication or new problems. Patient denies any new cramping, numbness, burning or tingling in the legs.  Patient Active Problem List   Diagnosis Date Noted  . Uncontrolled type 2 diabetes mellitus with diabetic polyneuropathy, with long-term current use of insulin (HCC) 03/05/2016  . Tobacco abuse counseling 03/05/2016  . Right sided abdominal pain 03/05/2016  . Constipation 03/05/2016  . Chest pain at rest 11/06/2015  . Diastolic heart failure (HCC) 11/06/2015  . Acute bronchitis with chronic obstructive pulmonary disease (COPD) (HCC) 11/06/2015  . Neurological deficit, transient 11/06/2015  . Left facial numbness   . AKI (acute kidney injury) (HCC)   . Chest pain 05/19/2015  . History of gonorrhea 05/19/2015  . Hypotension 05/19/2015  . CKD (chronic kidney disease), stage III 05/19/2015  . Involuntary muscle contractions 10/12/2014  . Seizure disorder after [facility] 08/19/2014  . Diabetic peripheral neuropathy associated with type 2 diabetes mellitus (HCC) 07/26/2014  . Blind 07/26/2014  . Tobacco abuse 04/26/2014  . Obesity (BMI 30-39.9) 08/18/2013  . COPD exacerbation (HCC) 08/06/2013  . SAH (subarachnoid hemorrhage) (HCC) 05/30/2013  . Coronary atherosclerosis of native coronary artery   . Vitamin D deficiency   . Memory loss   . COPD (chronic obstructive pulmonary disease) (HCC) 03/16/2013  . Essential hypertension, benign 03/16/2013  . Depressive disorder, not elsewhere classified 03/16/2013  . Spinal stenosis, lumbar region, with neurogenic claudication 03/16/2013   Current Outpatient Prescriptions on File Prior to Visit  Medication Sig Dispense Refill   . amLODipine (NORVASC) 2.5 MG tablet Take one tablet by mouth once daily for blood pressure 90 tablet 3  . aspirin EC 81 MG tablet Take 1 tablet (81 mg total) by mouth daily. 30 tablet 3  . Cholecalciferol (VITAMIN D) 2000 units CAPS Take 1 capsule (2,000 Units total) by mouth daily. For vitamin D repletion 30 capsule 11  . CRESTOR 20 MG tablet Take 20 mg by mouth daily. For cholesterol    . esomeprazole (NEXIUM) 40 MG capsule Take one capsule by mouth once daily before breakfast for stomach 30 capsule 11  . GARLIC PO Take 2,482 mcg by mouth daily.     Marland Kitchen glucose blood test strip Use to check blood sugar twice daily. Dx: E11.41 100 each 12  . [name] 75/25 KWIKPEN (75-25) 100 UNIT/ML Kwikpen INJECT 36 UNITS INTO THE SKIN TWICE A DAY 15 mL 11  . levalbuterol (XOPENEX) 1.25 MG/3ML nebulizer solution Take 1.25 mg by nebulization every 4 (four) hours as needed for wheezing. 72 mL 12  . levETIRAcetam (KEPPRA XR) 500 MG 24 hr tablet TAKE TWO   TABLETS BY MOUTH   DAILY 180 tablet 2  . linagliptin (TRADJENTA) 5 MG TABS tablet Take one tablet by mouth once daily to control blood sugar 30 tablet 5  . lisinopril (PRINIVIL,ZESTRIL) 10 MG tablet TAKE ONE TABLET   BY MOUTH   DAILY   FOR BLOOD PRESSURE 90 tablet 3  . metoprolol succinate (TOPROL-XL) 100 MG 24 hr tablet Take one tablet by mouth once daily for blood pressure 90 tablet 3  . nitroGLYCERIN (NITRODUR - DOSED IN MG/24 HR) 0.4 mg/hr Place 1 patch onto the skin daily. For chest  pain    . Omega 3 1200 MG CAPS Take 2,400 mg by mouth daily. For cholesterol    . oxyCODONE (ROXICODONE) 5 MG immediate release tablet Take one tablet by mouth every 6 hours as needed for pain 120 tablet 0  . PROAIR HFA 108 (90 Base) MCG/ACT inhaler INHALE TWO   PUFFS INTO THE LUNGS EVERY   TWO   HOURS IF NEEDED 17 each 5  . sertraline (ZOLOFT) 50 MG tablet TAKE ONE TABLET   BY MOUTH   DAILY 30 tablet 5   No current facility-administered medications on file prior to visit.     Allergies  Allergen Reactions  . Nsaids Other (See Comments)    Renal failure  . Flexeril [Cyclobenzaprine] Other (See Comments)    delirium    No results found for this or any previous visit (from the past 2160 hour(s)).  Objective: General: Patient is awake, alert, and oriented x 3 and in no acute distress.  Integument: Skin is warm, dry and supple bilateral. Nails are tender, long, thickened and dystrophic with subungual debris, consistent with onychomycosis, 1-5 bilateral. No signs of infection. No open lesions or preulcerative lesions present bilateral. Remaining integument unremarkable.  Vasculature:  Dorsalis Pedis pulse 1/4 bilateral. Posterior Tibial pulse  1/4 bilateral. Capillary fill time <3 sec 1-5 bilateral. Scant hair growth to the level of the digits.Temperature gradient within normal limits. No varicosities present bilateral. No edema present bilateral.   Neurology: The patient has intact sensation measured with a 5.07/10g Semmes Weinstein Monofilament at all pedal sites bilateral . Vibratory sensation diminished bilateral with tuning fork. No Babinski sign present bilateral.   Musculoskeletal: No symptomatic pedal deformities noted bilateral. Muscular strength 5/5 in all lower extremity muscular groups bilateral without pain on range of motion. No tenderness with calf compression bilateral.  Assessment and Plan: Problem List Items Addressed This Visit    None    Visit Diagnoses    Dermatophytosis of nail    -  Primary   Foot pain, bilateral       Diabetic polyneuropathy associated with type 2 diabetes mellitus (HCC)         -Examined patient. -Discussed and educated patient on diabetic foot care, especially with  regards to the vascular, neurological and musculoskeletal systems.  -Stressed the importance of good glycemic control and the detriment of not  controlling glucose levels in relation to the foot. -Mechanically debrided all nails 1-5 bilateral using  sterile nail nipper and filed with dremel without incident  -Answered all patient questions -Patient to return  in 3 months for at risk foot care -Patient advised to call the office if any problems or questions arise in the meantime.  Asencion Islam, DPM

## 2016-07-29 ENCOUNTER — Other Ambulatory Visit: Payer: Self-pay | Admitting: *Deleted

## 2016-07-29 NOTE — Patient Outreach (Signed)
[facility]) Care Management   07/29/2016  Eddie Graves [birth year] [id number]  Eddie Graves is an 82 y.o. male  Subjective:   Member denies pain or discomfort at this time.  He reports that he has been able to check his blood sugars more often independently, but when he is unable to do so he has his grandson check it for him.  He state that he has been compliant with all of his medications.  He report that he had his nebulizer machine repaired but is still unable to use it due to the medication coming out of the mask.    Objective:   Review of Systems  Constitutional: Negative.   HENT: Negative.   Eyes: Negative.   Respiratory: Negative.   Cardiovascular: Negative.   Gastrointestinal: Negative.   Genitourinary: Negative.   Musculoskeletal: Negative.   Skin: Negative.   Neurological: Negative.   Endo/Heme/Allergies: Negative.   Psychiatric/Behavioral: Positive for depression.    Physical Exam  Constitutional: He is oriented to person, place, and time. He appears well-developed and well-nourished.  Neck: Normal range of motion.  Cardiovascular: Normal rate, regular rhythm and normal heart sounds.   Respiratory: Effort normal and breath sounds normal.  GI: Soft. Bowel sounds are normal.  Musculoskeletal: Normal range of motion.  Neurological: He is alert and oriented to person, place, and time.  Skin: Skin is warm and dry.    BP 118/72 (BP Location: Left Arm, Patient Position: Sitting, Cuff Size: Normal)   Pulse 81   Resp 20   SpO2 97%    Encounter Medications:   Outpatient Encounter Prescriptions as of 07/29/2016  Medication Sig Note  . amLODipine (NORVASC) 2.5 MG tablet Take one tablet by mouth once daily for blood pressure   . aspirin EC 81 MG tablet Take 1 tablet (81 mg total) by mouth daily. 06/26/2016: Stopped by PCP per 06/22/16 chart note.    . Cholecalciferol (VITAMIN D) 2000 units CAPS Take 1 capsule (2,000 Units total) by mouth daily. For  vitamin D repletion   . CRESTOR 20 MG tablet Take 20 mg by mouth daily. For cholesterol 10/11/2014: .  . esomeprazole (NEXIUM) 40 MG capsule Take one capsule by mouth once daily before breakfast for stomach   . GARLIC PO Take 7,564 mcg by mouth daily.    Marland Kitchen glucose blood test strip Use to check blood sugar twice daily. Dx: E11.41   . [name] 75/25 KWIKPEN (75-25) 100 UNIT/ML Kwikpen INJECT 36 UNITS INTO THE SKIN TWICE A DAY   . levalbuterol (XOPENEX) 1.25 MG/3ML nebulizer solution Take 1.25 mg by nebulization every 4 (four) hours as needed for wheezing.   . levETIRAcetam (KEPPRA XR) 500 MG 24 hr tablet TAKE TWO   TABLETS BY MOUTH   DAILY   . linagliptin (TRADJENTA) 5 MG TABS tablet Take one tablet by mouth once daily to control blood sugar   . lisinopril (PRINIVIL,ZESTRIL) 10 MG tablet TAKE ONE TABLET   BY MOUTH   DAILY   FOR BLOOD PRESSURE   . metoprolol succinate (TOPROL-XL) 100 MG 24 hr tablet Take one tablet by mouth once daily for blood pressure   . nitroGLYCERIN (NITRODUR - DOSED IN MG/24 HR) 0.4 mg/hr Place 1 patch onto the skin daily. For chest pain   . Omega 3 1200 MG CAPS Take 2,400 mg by mouth daily. For cholesterol   . oxyCODONE (ROXICODONE) 5 MG immediate release tablet Take one tablet by mouth every 6 hours as needed  for pain   . PROAIR HFA 108 (90 Base) MCG/ACT inhaler INHALE TWO   PUFFS INTO THE LUNGS EVERY   TWO   HOURS IF NEEDED 06/19/2016: Patient reports he uses 1 puff about every 6-8 hours  . sertraline (ZOLOFT) 50 MG tablet TAKE ONE TABLET   BY MOUTH   DAILY    No facility-administered encounter medications on file as of 07/29/2016.     Functional Status:   In your present state of health, do you have any difficulty performing the following activities: 06/29/2016 12/12/2015  Hearing? Tempie Donning  Vision? Y Y  Difficulty concentrating or making decisions? Y N  Walking or climbing stairs? N Y  Dressing or bathing? N N  Doing errands, shopping? Tempie Donning  Preparing Food and eating ? Y N   Using the Toilet? N N  In the past six months, have you accidently leaked urine? N N  Do you have problems with loss of bowel control? N N  Managing your Medications? Y N  Managing your Finances? Tempie Donning  Housekeeping or managing your Housekeeping? Tempie Donning  Some recent data might be hidden    Fall/Depression Screening:    PHQ 2/9 Scores 06/29/2016 06/01/2016 03/05/2016 12/12/2015 11/26/2015 05/27/2015 11/29/2014  PHQ - 2 Score 4 2 0 1 3 0 0  PHQ- 9 Score 4 5 - 3 11 - -    Assessment:    Nebulizer machine assessed, member has all equipment and machine is working.  He was able to independently administer treatment.  All equipment needed placed in one area, extra equipment/tubing separated.    Discussed with member again about the possibility of being involved with adult day facility programs and/or assisted living facilities to provide additional support, he again refuses.  He state that he will not consider asking or accepting any assistance from anyone other than his son and grandson, both of whom provide minimal support.  He does report that he is ready to schedule an appointment with a psychiatrist.  However, he report he no longer have the information provided by the PCP office.    He at this time denies any further needs.  He and his son are provided with contact information for this care manager, advised to contact is member changes mind about additional support.  Plan:   Will have PCP office send information on psychiatric services again. Will notify care management assistant and PCP of case closure.  [facility] CM Care Plan Problem One   Flowsheet Row Most Recent Value  Care Plan Problem One  Risk for admission for COPD complications related to broken equipment (nebulizer) as evidenced by member's statement of concern   Role Documenting the Problem One  Care Management [place] for Problem One  Not Active  [facility] CM Short Term Goal #1 (0-30 days)  Member will have new nebulizer  machine within the next 2 weeks  [facility] CM Short Term Goal #1 Start Date  07/17/16 Barrie Folk not met, date reset]  [facility] CM Short Term Goal #1 Met Date  07/29/16  Interventions for Short Term Goal #1  Call placed to [place] to obtain directions to repair broken machine.  Son provided detailed information on how to obtain replacement equipment. Advised again to have son take him to obtain new machine     Valente David, Therapist, sports, MSN [place] [phone]

## 2016-07-30 ENCOUNTER — Encounter: Payer: Self-pay | Admitting: *Deleted

## 2016-07-31 ENCOUNTER — Encounter (HOSPITAL_COMMUNITY): Admission: RE | Admit: 2016-07-31 | Payer: Medicare Other | Source: Ambulatory Visit

## 2016-08-06 ENCOUNTER — Encounter (HOSPITAL_COMMUNITY)
Admission: RE | Admit: 2016-08-06 | Discharge: 2016-08-06 | Disposition: A | Discharge status: Home / Self Care | Payer: Medicare Other | Source: Ambulatory Visit | Attending: Cardiology | Admitting: Cardiology

## 2016-08-06 ENCOUNTER — Telehealth: Payer: Self-pay

## 2016-08-06 ENCOUNTER — Other Ambulatory Visit: Payer: Self-pay

## 2016-08-06 DIAGNOSIS — E119 Type 2 diabetes mellitus without complications: Secondary | ICD-10-CM | POA: Diagnosis not present

## 2016-08-06 DIAGNOSIS — I10 Essential (primary) hypertension: Secondary | ICD-10-CM | POA: Diagnosis not present

## 2016-08-06 DIAGNOSIS — M5030 Other cervical disc degeneration, unspecified cervical region: Secondary | ICD-10-CM

## 2016-08-06 DIAGNOSIS — I25119 Atherosclerotic heart disease of native coronary artery with unspecified angina pectoris: Secondary | ICD-10-CM | POA: Diagnosis not present

## 2016-08-06 DIAGNOSIS — R079 Chest pain, unspecified: Secondary | ICD-10-CM | POA: Diagnosis not present

## 2016-08-06 DIAGNOSIS — E785 Hyperlipidemia, unspecified: Secondary | ICD-10-CM | POA: Diagnosis not present

## 2016-08-06 MED ORDER — REGADENOSON 0.4 MG/5ML IV SOLN
0.4000 mg | Freq: Once | INTRAVENOUS | Status: AC
  Administered 2016-08-06: 0.4 mg via INTRAVENOUS

## 2016-08-06 MED ORDER — REGADENOSON 0.4 MG/5ML IV SOLN
INTRAVENOUS | Status: AC
  Filled 2016-08-06: qty 5

## 2016-08-06 MED ORDER — TECHNETIUM TC 99M TETROFOSMIN IV KIT
30.0000 | PACK | Freq: Once | INTRAVENOUS | Status: AC | PRN
  Administered 2016-08-06: 30 via INTRAVENOUS

## 2016-08-06 MED ORDER — TECHNETIUM TC 99M TETROFOSMIN IV KIT
10.0000 | PACK | Freq: Once | INTRAVENOUS | Status: AC | PRN
  Administered 2016-08-06: 10 via INTRAVENOUS

## 2016-08-06 MED ORDER — OXYCODONE HCL 5 MG PO TABS: ORAL_TABLET | ORAL | 0 refills | Status: DC

## 2016-08-06 NOTE — Telephone Encounter (Signed)
I called patient to let him know that his prescription is ready to pick up and that it would be at the front desk. He said that he was on his way.

## 2016-08-12 ENCOUNTER — Other Ambulatory Visit: Payer: Medicare Other

## 2016-08-12 DIAGNOSIS — Z794 Long term (current) use of insulin: Secondary | ICD-10-CM | POA: Diagnosis not present

## 2016-08-12 DIAGNOSIS — E1142 Type 2 diabetes mellitus with diabetic polyneuropathy: Secondary | ICD-10-CM

## 2016-08-12 LAB — COMPREHENSIVE METABOLIC PANEL
ALT: 11 U/L (ref 9–46)
AST: 24 U/L (ref 10–35)
Albumin: 4.1 g/dL (ref 3.6–5.1)
Alkaline Phosphatase: 101 U/L (ref 40–115)
BUN: 10 mg/dL (ref 7–25)
CO2: 24 mmol/L (ref 20–31)
Calcium: 10 mg/dL (ref 8.6–10.3)
Chloride: 103 mmol/L (ref 98–110)
Creat: 1.26 mg/dL — ABNORMAL HIGH (ref 0.70–1.11)
Glucose, Bld: 160 mg/dL — ABNORMAL HIGH (ref 65–99)
Potassium: 4.4 mmol/L (ref 3.5–5.3)
Sodium: 139 mmol/L (ref 135–146)
Total Bilirubin: 0.5 mg/dL (ref 0.2–1.2)
Total Protein: 7.3 g/dL (ref 6.1–8.1)

## 2016-08-13 DIAGNOSIS — Z794 Long term (current) use of insulin: Secondary | ICD-10-CM | POA: Diagnosis not present

## 2016-08-13 DIAGNOSIS — E1142 Type 2 diabetes mellitus with diabetic polyneuropathy: Secondary | ICD-10-CM | POA: Diagnosis not present

## 2016-08-13 LAB — HEMOGLOBIN A1C
Hgb A1c MFr Bld: 8.1 % — ABNORMAL HIGH (ref <5.7)
Mean Plasma Glucose: 186 mg/dL

## 2016-08-14 LAB — MICROALBUMIN / CREATININE URINE RATIO
Creatinine, Urine: 486 mg/dL — ABNORMAL HIGH (ref 20–370)
Microalb Creat Ratio: 27 mcg/mg creat (ref <30)
Microalb, Ur: 13.1 mg/dL

## 2016-08-17 ENCOUNTER — Ambulatory Visit (INDEPENDENT_AMBULATORY_CARE_PROVIDER_SITE_OTHER): Payer: Medicare Other | Admitting: Pharmacotherapy

## 2016-08-17 ENCOUNTER — Encounter: Payer: Self-pay | Admitting: Pharmacotherapy

## 2016-08-17 VITALS — BP 126/78 | HR 72 | Resp 16 | Ht 66.0 in | Wt 215.0 lb

## 2016-08-17 DIAGNOSIS — Z794 Long term (current) use of insulin: Secondary | ICD-10-CM

## 2016-08-17 DIAGNOSIS — E1142 Type 2 diabetes mellitus with diabetic polyneuropathy: Secondary | ICD-10-CM | POA: Diagnosis not present

## 2016-08-17 DIAGNOSIS — E1165 Type 2 diabetes mellitus with hyperglycemia: Secondary | ICD-10-CM

## 2016-08-17 DIAGNOSIS — IMO0002 Reserved for concepts with insufficient information to code with codable children: Secondary | ICD-10-CM

## 2016-08-17 DIAGNOSIS — I10 Essential (primary) hypertension: Secondary | ICD-10-CM | POA: Diagnosis not present

## 2016-08-17 DIAGNOSIS — Z23 Encounter for immunization: Secondary | ICD-10-CM | POA: Diagnosis not present

## 2016-08-17 MED ORDER — INSULIN LISPRO PROT & LISPRO (75-25 MIX) 100 UNIT/ML KWIKPEN: 40.0000 [IU] | PEN_INJECTOR | Freq: Two times a day (BID) | SUBCUTANEOUS | 11 refills | Status: DC

## 2016-08-17 NOTE — Patient Instructions (Signed)
Increase insulin to 40 units with breakfast and supper

## 2016-08-17 NOTE — Progress Notes (Signed)
  Subjective:    Eddie Graves is a 82 y.o. African American male who presents for follow-up of Type 2 diabetes mellitus.   A1C worse - now 8.1% (was 7.6%) Estimated GFR:  35ml/min  Should be taking Humalog 75/25 Mix 36 units twice daily.  Has a history of frequently missing doses. Did not bring blood glucose meter. Says his morning BG in the 140's.  Afternoon BG are higher - some 200's. Denies missed doses of insulin today.  Usually eats "OK", but has been eating chocolate cake due to son-in-law's birthday. No routine exercise. He is blind. Has peripheral edema. Denies problems with feet. Nocturia about every hour. No dysuria. Not drinking much water.  Has polyuria.  Review of Systems A comprehensive review of systems was negative except for: Eyes: positive for blindness Cardiovascular: positive for lower extremity edema Genitourinary: positive for nocturia Endocrine: positive for diabetic symptoms including polyuria    Objective:    BP 126/78   Pulse 72   Resp 16   Ht 5\' 6"  (1.676 m)   Wt 215 lb (97.5 kg)   BMI 34.70 kg/m   General:  alert, cooperative and no distress  Oropharynx: normal findings: lips normal without lesions and gums healthy   Eyes:  he is blind   Ears:  external ears normal        Lung: clear to auscultation bilaterally  Heart:  regular rate and rhythm     Extremities: edema bilateral lower extremities  Skin: dry     Neuro: mental status, speech normal, alert and oriented x3, gait and station normal and requires assistance due to blindness   Lab Review Glucose (mg/dL)  Date Value  92  11/14/2015 88  08/30/2015 159 (H)   Glucose, Bld (mg/dL)  Date Value  09/01/2015 160 (H)  11/06/2015 144 (H)  05/20/2015 121 (H)   CO2 (mmol/L)  Date Value  08/12/2016 24  03/05/2016 24  11/14/2015 21   BUN (mg/dL)  Date Value  14/07/2015 10  03/05/2016 9  11/14/2015 21  11/06/2015 12  08/30/2015 21   Creat (mg/dL)  Date  Value  09/01/2015 1.26 (H)   Creatinine, Ser (mg/dL)  Date Value  65/78/4696 1.27  11/14/2015 1.33 (H)  11/06/2015 1.28 (H)       Assessment:    Diabetes Mellitus type II, under fair control. A1C above goal <7% BP at goal <140/90   Plan:    1.  Rx changes: Increase insulin to 40 units twice daily  2.  Counseled on nutrition goals. 3.  Counseled on need for routine exercise.  Goal is 30-45 minutes 5 x week. 4.  Counseled on complications of uncontrolled DM. 5.  Encouraged adequate hydration. 6.  Flu vaccine given today. 7.  BP at goal <140/90 8.  Continue Tradjenta 5mg  QD.

## 2016-09-03 ENCOUNTER — Encounter: Payer: Self-pay | Admitting: Internal Medicine

## 2016-09-03 ENCOUNTER — Ambulatory Visit (INDEPENDENT_AMBULATORY_CARE_PROVIDER_SITE_OTHER): Payer: Medicare Other | Admitting: Internal Medicine

## 2016-09-03 VITALS — BP 140/80 | HR 79 | Temp 98.6°F | Wt 214.0 lb

## 2016-09-03 DIAGNOSIS — I10 Essential (primary) hypertension: Secondary | ICD-10-CM

## 2016-09-03 DIAGNOSIS — Z716 Tobacco abuse counseling: Secondary | ICD-10-CM | POA: Diagnosis not present

## 2016-09-03 DIAGNOSIS — Z794 Long term (current) use of insulin: Secondary | ICD-10-CM

## 2016-09-03 DIAGNOSIS — J410 Simple chronic bronchitis: Secondary | ICD-10-CM

## 2016-09-03 DIAGNOSIS — M5030 Other cervical disc degeneration, unspecified cervical region: Secondary | ICD-10-CM

## 2016-09-03 DIAGNOSIS — I25110 Atherosclerotic heart disease of native coronary artery with unstable angina pectoris: Secondary | ICD-10-CM | POA: Diagnosis not present

## 2016-09-03 DIAGNOSIS — IMO0002 Reserved for concepts with insufficient information to code with codable children: Secondary | ICD-10-CM

## 2016-09-03 DIAGNOSIS — E1142 Type 2 diabetes mellitus with diabetic polyneuropathy: Secondary | ICD-10-CM

## 2016-09-03 DIAGNOSIS — E1165 Type 2 diabetes mellitus with hyperglycemia: Secondary | ICD-10-CM | POA: Diagnosis not present

## 2016-09-03 MED ORDER — OXYCODONE HCL 5 MG PO TABS: 5.0000 mg | ORAL_TABLET | Freq: Two times a day (BID) | ORAL | 0 refills | Status: DC | PRN

## 2016-09-03 MED ORDER — MELOXICAM 7.5 MG PO TABS: 7.5000 mg | ORAL_TABLET | Freq: Every day | ORAL | 0 refills | Status: DC

## 2016-09-03 NOTE — Progress Notes (Signed)
[facility] Provider:  Corbitt Cloke L. Renato Gailseed, D.O., C.M.D.  Code Status: full code Goals of Care:  Advanced Directives 06/29/2016  Does patient have an advance directive? No  Would patient like information on creating an advanced directive? -  Pre-existing out of facility DNR order (yellow form or pink MOST form) -     Chief Complaint  Patient presents with  . Medical Management of Chronic Issues    3 mth follow-up, discuss bad dreams on nebulizer, pain in shoulder, back & hips    HPI: Patient is a 82 y.o. male seen today for medical management of chronic diseases.    Was having bad dreams after taking his xopenex nebs.  He quit using it and only had the dreams once since.  Says he smoked one cigarette per day.  Had been up to 4-5 per day.  Is using 2 puffs of albuterol three times daily.    Continues with pain in his shoulders, back and hips.  Pains are a lot worse.  Wasn't having any hip pain when he was taking the mobic from Dr. Montez Moritaarter.    Hba1c is back up to 8.1 again from 7.6.  Still has difficulty obtaining his CBGs. Lately, he's having no trouble.  Misses insulin sometimes in evenings (2x per week roughly) b/c he wants to eat early before [name] gets home, but then doesn't take insulin and forgets when he does get home.    Kidneys stable with cr 1.26.  No improvement since stopping the mobic.    Has had chest tightening before bed--2x just before bed.  Takes a ntg and goes to sleep and it's gone.  Sees Dr. Sharyn LullHarwani.  Says he only takes his oxycodone twice a day. His son is saying it's due soon, but pt says it is due next week (shows as due 10/1).  He got enough pills for 4x per day last time.    Past Medical History:  Diagnosis Date  . Anxiety state, unspecified   . Blind   . Chronic airway obstruction, not elsewhere classified   . Coronary atherosclerosis of native coronary artery   . Cough   . Depressive disorder, not elsewhere classified   . DM (diabetes  mellitus) type II controlled peripheral vascular disorder (HCC)   . Essential hypertension, benign   . H/O hiatal hernia   . Headache(784.0)   . [place] blindness, [place]   . Memory loss   . Other and unspecified hyperlipidemia   . Pain in joint, site unspecified   . Palpitations   . Rash and other nonspecific skin eruption   . Reflux esophagitis   . Sebaceous cyst   . Spinal stenosis, lumbar region, with neurogenic claudication   . Type II diabetes mellitus with neurological manifestations, uncontrolled (HCC)   . Unspecified constipation   . Vitamin D deficiency     Past Surgical History:  Procedure Laterality Date  . CORONARY ANGIOPLASTY WITH STENT PLACEMENT    . EYE SURGERY     right prosthetic globe  . HERNIA REPAIR      Allergies  Allergen Reactions  . Nsaids Other (See Comments)    Renal failure  . Flexeril [Cyclobenzaprine] Other (See Comments)    delirium      Medication List       Accurate as of 09/03/16  3:08 PM. Always use your most recent med list.          amLODipine 2.5 MG tablet Commonly  known as:  NORVASC Take one tablet by mouth once daily for blood pressure   CRESTOR 20 MG tablet Generic drug:  rosuvastatin Take 20 mg by mouth daily. For cholesterol   esomeprazole 40 MG capsule Commonly known as:  NEXIUM Take one capsule by mouth once daily before breakfast for stomach   GARLIC PO Take 4,270 mcg by mouth daily.   glucose blood test strip Use to check blood sugar twice daily. Dx: E11.41   Insulin Lispro Prot & Lispro (75-25) 100 UNIT/ML Kwikpen Commonly known as:  [name] 75/25 KWIKPEN Inject 40 Units into the skin 2 (two) times daily with a meal.   levalbuterol 1.25 MG/3ML nebulizer solution Commonly known as:  XOPENEX Take 1.25 mg by nebulization every 4 (four) hours as needed for wheezing.   levETIRAcetam 500 MG 24 hr tablet Commonly known as:  KEPPRA XR TAKE TWO   TABLETS BY MOUTH   DAILY   linagliptin 5 MG Tabs  tablet Commonly known as:  TRADJENTA Take one tablet by mouth once daily to control blood sugar   lisinopril 10 MG tablet Commonly known as:  PRINIVIL,ZESTRIL TAKE ONE TABLET   BY MOUTH   DAILY   FOR BLOOD PRESSURE   metoprolol succinate 100 MG 24 hr tablet Commonly known as:  TOPROL-XL Take one tablet by mouth once daily for blood pressure   nitroGLYCERIN 0.4 mg/hr patch Commonly known as:  NITRODUR - Dosed in mg/24 hr Place 1 patch onto the skin daily. For chest pain   Omega 3 1200 MG Caps Take 2,400 mg by mouth daily. For cholesterol   oxyCODONE 5 MG immediate release tablet Commonly known as:  ROXICODONE Take one tablet by mouth every 6 hours as needed for pain   PROAIR HFA 108 (90 Base) MCG/ACT inhaler Generic drug:  albuterol INHALE TWO   PUFFS INTO THE LUNGS EVERY   TWO   HOURS IF NEEDED   sertraline 50 MG tablet Commonly known as:  ZOLOFT TAKE ONE TABLET   BY MOUTH   DAILY   Vitamin D 2000 units Caps Take 1 capsule (2,000 Units total) by mouth daily. For vitamin D repletion       Review of Systems:  Review of Systems  Constitutional: Negative for chills, diaphoresis, fever, malaise/fatigue and weight loss.  HENT: Negative for congestion, hearing loss and sore throat.   Eyes: Negative for blurred vision.  Respiratory: Positive for cough and wheezing. Negative for hemoptysis, sputum production and shortness of breath.        Still smoking  Cardiovascular: Positive for chest pain. Negative for palpitations, orthopnea, leg swelling and PND.       Just had normal stress test and cardiac eval  Gastrointestinal: Negative for abdominal pain, constipation, diarrhea, nausea and vomiting.  Genitourinary: Negative for dysuria, frequency and urgency.  Musculoskeletal: Positive for back pain. Negative for falls.  Skin: Negative for itching and rash.  Neurological: Negative for dizziness, loss of consciousness and weakness.  Endo/Heme/Allergies:       Diabetes, not  faithful with insulin  Psychiatric/Behavioral: Positive for depression and memory loss. The patient is not nervous/anxious.     Health Maintenance  Topic Date Due  . ZOSTAVAX  06/02/1993  . OPHTHALMOLOGY EXAM  09/07/2015  . HEMOGLOBIN A1C  02/09/2017  . FOOT EXAM  05/11/2017  . TETANUS/TDAP  12/30/2021  . INFLUENZA VACCINE  Completed  . PNA vac Low Risk Adult  Completed    Physical Exam: Vitals:   09/03/16 1449  BP: 140/80  Pulse: 79  Temp: 98.6 F (37 C)  TempSrc: Oral  SpO2: 97%  Weight: 214 lb (97.1 kg)   Body mass index is 34.54 kg/m. Physical Exam  Constitutional: He is oriented to person, place, and time. He appears well-nourished. No distress.  HENT:  blind  Neck: Neck supple.  Cardiovascular: Normal rate, regular rhythm, normal heart sounds and intact distal pulses.   Pulmonary/Chest: Effort normal. No respiratory distress. He has wheezes. He has no rales.  Coughing some, congested and dyspneic, but moving air  Abdominal: Soft. Bowel sounds are normal.  Musculoskeletal: Normal range of motion. He exhibits no tenderness.  Neurological: He is alert and oriented to person, place, and time.  Skin: Skin is warm and dry. No rash noted.  Psychiatric: He has a normal mood and affect.    Labs reviewed: Basic Metabolic Panel:  Recent Labs  79/89/21 1616 03/05/16 1643 08/12/16 0952  NA 139 142 139  K 4.8 5.1 4.4  CL 103 101 103  CO2 21 24 24   GLUCOSE 88 92 160*  BUN 21 9 10   CREATININE 1.33* 1.27 1.26*  CALCIUM 9.9 9.5 10.0   Liver Function Tests:  Recent Labs  11/14/15 1616 03/05/16 1643 08/12/16 0952  AST 21 29 24   ALT 13 9 11   ALKPHOS 114 115 101  BILITOT 0.5 0.5 0.5  PROT 6.9 7.1 7.3  ALBUMIN 4.1 4.2 4.1   No results for input(s): LIPASE, AMYLASE in the last 8760 hours. No results for input(s): AMMONIA in the last 8760 hours. CBC:  Recent Labs  11/06/15 0827  WBC 6.7  HGB 13.2  HCT 41.2  MCV 86.9  PLT 189   Lipid Panel: No  results for input(s): CHOL, HDL, LDLCALC, TRIG, CHOLHDL, LDLDIRECT in the last 8760 hours. Lab Results  Component Value Date   HGBA1C 8.1 (H) 08/12/2016    Procedures since last visit: Nm Myocar Multi W/spect W/wall Motion / Ef  Result Date: 08/06/2016 CLINICAL DATA:  82 year old male with a history of chest pain. Cardiovascular risk factors include known coronary artery disease with stenting, tobacco use, diabetes, hypertension EXAM: MYOCARDIAL IMAGING WITH SPECT (REST AND PHARMACOLOGIC-STRESS) GATED LEFT VENTRICULAR WALL MOTION STUDY LEFT VENTRICULAR EJECTION FRACTION TECHNIQUE: Standard myocardial SPECT imaging was performed after resting intravenous injection of 10 mCi Tc-26m tetrofosmin. Subsequently, intravenous infusion of Lexiscan was performed under the supervision of the Cardiology staff. At peak effect of the drug, 30 mCi Tc-51m tetrofosmin was injected intravenously and standard myocardial SPECT imaging was performed. Quantitative gated imaging was also performed to evaluate left ventricular wall motion, and estimate left ventricular ejection fraction. COMPARISON:  None. FINDINGS: Perfusion: No fixed defects. There is a moderate-sized region of moderate decreased perfusion on stress images at the inferior wall extending from the mid ventricle to base. Wall Motion: Normal left ventricular wall motion. No left ventricular dilation. Left Ventricular Ejection Fraction: 50 % End diastolic volume 79 ml End systolic volume 40 ml IMPRESSION: 1. There is a moderate sized region of moderately decreased perfusion at the inferior wall extending from mid ventricle to base, that either represents attenuation artifact or region of reversible ischemia. 2. Normal left ventricular wall motion. 3. Left ventricular ejection fraction 50% 4. Non invasive risk stratification*: Intermediate *2012 Appropriate Use Criteria for Coronary Revascularization Focused Update: J Am Coll Cardiol. 2012;59(9):857-881.  [email or web address] Electronically Signed   By: 86m D.O.   On: 08/06/2016 17:41    Assessment/Plan 1. Uncontrolled type 2 diabetes mellitus with  diabetic polyneuropathy, with long-term current use of insulin (HCC) - still not taking his insulin regularly or checking his glucose, still having trouble using lancets and machine - see above - cont same regimen - Lipid panel; Future - COMPLETE METABOLIC PANEL WITH GFR; Future  2. Essential hypertension, benign -bp at upper limits today, cont same regimen - COMPLETE METABOLIC PANEL WITH GFR; Future  3. Degenerative disc disease, cervical - cont current regimen--note that I reduced the number of tablets of oxycodone b/c pt is only using it twice a day but I'd ordered it q 6 when he was still having pain -son is saying he needs the meds, but not due until next week so NOT given today - oxyCODONE (ROXICODONE) 5 MG immediate release tablet; Take 1 tablet (5 mg total) by mouth 2 (two) times daily as needed for severe pain.  Dispense: 60 tablet; Refill: 0 -will try mobic but I'm concerned it will alter his renal function so I want him to come back for f/u labs to make sure it's ok - meloxicam (MOBIC) 7.5 MG tablet; Take 1 tablet (7.5 mg total) by mouth daily.  Dispense: 30 tablet; Refill: 0  4. Tobacco abuse counseling -continues to keep smoking and actually was smoking more lately which is not helping his lungs -advised to restart his xopenex nebs and see if his nightmares return--if so, I will change his medication and likely put him back on a maintenance med  5. Atherosclerosis of native coronary artery of native heart with unstable angina pectoris (HCC) -still having chest pain occasionally, but relieved with his ntg, not a candidate for surgery - Lipid panel; Future  6. Simple chronic bronchitis (HCC) -see tobacco abuse above  Labs/tests ordered:  Orders Placed This Encounter  Procedures    . Lipid panel    Standing Status:   Future    Standing Expiration Date:   12/06/2017    Order Specific Question:   Has the patient fasted?    Answer:   Yes  . COMPLETE METABOLIC PANEL WITH GFR    [facility]    Standing Status:   Future    Standing Expiration Date:   09/03/2017   Next appt:  11/23/2016   Graysin Luczynski L. Tahjay Binion, D.O. Geriatrics [facility] [address], [place] [address] Cell Phone (Mon-Fri 8am-5pm):  [phone] On Call:  [phone] & follow prompts after 5pm & weekends Office Phone:  [phone] Office Fax:  [phone]

## 2016-09-09 ENCOUNTER — Other Ambulatory Visit: Payer: Self-pay | Admitting: *Deleted

## 2016-09-09 DIAGNOSIS — M5030 Other cervical disc degeneration, unspecified cervical region: Secondary | ICD-10-CM

## 2016-09-09 MED ORDER — OXYCODONE HCL 5 MG PO TABS: 5.0000 mg | ORAL_TABLET | Freq: Two times a day (BID) | ORAL | 0 refills | Status: DC | PRN

## 2016-09-09 NOTE — Telephone Encounter (Signed)
Patient requested and will pick up 

## 2016-10-01 ENCOUNTER — Other Ambulatory Visit: Payer: Medicare Other

## 2016-10-01 DIAGNOSIS — E1142 Type 2 diabetes mellitus with diabetic polyneuropathy: Secondary | ICD-10-CM

## 2016-10-01 DIAGNOSIS — IMO0002 Reserved for concepts with insufficient information to code with codable children: Secondary | ICD-10-CM

## 2016-10-01 DIAGNOSIS — Z794 Long term (current) use of insulin: Secondary | ICD-10-CM | POA: Diagnosis not present

## 2016-10-01 DIAGNOSIS — E1165 Type 2 diabetes mellitus with hyperglycemia: Principal | ICD-10-CM

## 2016-10-01 DIAGNOSIS — I10 Essential (primary) hypertension: Secondary | ICD-10-CM | POA: Diagnosis not present

## 2016-10-01 LAB — COMPLETE METABOLIC PANEL WITH GFR
ALT: 12 U/L (ref 9–46)
AST: 26 U/L (ref 10–35)
Albumin: 3.8 g/dL (ref 3.6–5.1)
Alkaline Phosphatase: 94 U/L (ref 40–115)
BUN: 9 mg/dL (ref 7–25)
CO2: 26 mmol/L (ref 20–31)
Calcium: 9.8 mg/dL (ref 8.6–10.3)
Chloride: 106 mmol/L (ref 98–110)
Creat: 1.34 mg/dL — ABNORMAL HIGH (ref 0.70–1.11)
GFR, Est African American: 56 mL/min — ABNORMAL LOW (ref >=60)
GFR, Est Non African American: 49 mL/min — ABNORMAL LOW (ref >=60)
Glucose, Bld: 136 mg/dL — ABNORMAL HIGH (ref 65–99)
Potassium: 4.4 mmol/L (ref 3.5–5.3)
Sodium: 140 mmol/L (ref 135–146)
Total Bilirubin: 0.4 mg/dL (ref 0.2–1.2)
Total Protein: 7.1 g/dL (ref 6.1–8.1)

## 2016-10-02 ENCOUNTER — Other Ambulatory Visit: Payer: Self-pay | Admitting: Internal Medicine

## 2016-10-09 ENCOUNTER — Other Ambulatory Visit: Payer: Self-pay | Admitting: Internal Medicine

## 2016-10-09 DIAGNOSIS — M5030 Other cervical disc degeneration, unspecified cervical region: Secondary | ICD-10-CM

## 2016-10-15 ENCOUNTER — Telehealth: Payer: Self-pay | Admitting: Internal Medicine

## 2016-10-15 NOTE — Telephone Encounter (Signed)
left msg asking pt to confirm this AWV appt w/ nurse. VDM (DD) °

## 2016-10-19 ENCOUNTER — Other Ambulatory Visit: Payer: Self-pay | Admitting: *Deleted

## 2016-10-19 DIAGNOSIS — M5030 Other cervical disc degeneration, unspecified cervical region: Secondary | ICD-10-CM

## 2016-10-19 MED ORDER — OXYCODONE HCL 5 MG PO TABS: 5.0000 mg | ORAL_TABLET | Freq: Two times a day (BID) | ORAL | 0 refills | Status: DC | PRN

## 2016-10-19 NOTE — Telephone Encounter (Signed)
Patient requested and will pick up 

## 2016-10-22 ENCOUNTER — Other Ambulatory Visit: Payer: Self-pay | Admitting: *Deleted

## 2016-10-22 MED ORDER — GLUCOSE BLOOD VI STRP: ORAL_STRIP | 12 refills | Status: DC

## 2016-10-22 NOTE — Telephone Encounter (Signed)
Patient son requested refill to be faxed to pharmacy.

## 2016-10-23 ENCOUNTER — Telehealth: Payer: Self-pay

## 2016-10-23 DIAGNOSIS — Z794 Long term (current) use of insulin: Principal | ICD-10-CM

## 2016-10-23 DIAGNOSIS — I10 Essential (primary) hypertension: Secondary | ICD-10-CM

## 2016-10-23 DIAGNOSIS — IMO0002 Reserved for concepts with insufficient information to code with codable children: Secondary | ICD-10-CM

## 2016-10-23 DIAGNOSIS — H547 Unspecified visual loss: Secondary | ICD-10-CM

## 2016-10-23 DIAGNOSIS — E1165 Type 2 diabetes mellitus with hyperglycemia: Principal | ICD-10-CM

## 2016-10-23 DIAGNOSIS — E1142 Type 2 diabetes mellitus with diabetic polyneuropathy: Secondary | ICD-10-CM

## 2016-10-23 MED ORDER — BLOOD PRESSURE CUFF MISC: 0 refills | Status: DC

## 2016-10-23 MED ORDER — GLUCOSE BLOOD VI STRP: ORAL_STRIP | 12 refills | Status: DC

## 2016-10-23 NOTE — Telephone Encounter (Signed)
Patient received the wrong test strips from the pharmacy and would like for [place] to re-submitt rx and make sure it says prodigy test strips (the prodigy meter read out loud and patient needs that due to blindness).  I re-submitted rx and informed patient's son/caregiver to check the strips prior to leaving the pharmacy to confirm they are the correct strips

## 2016-10-30 ENCOUNTER — Other Ambulatory Visit: Payer: Self-pay | Admitting: Internal Medicine

## 2016-10-30 DIAGNOSIS — M5030 Other cervical disc degeneration, unspecified cervical region: Secondary | ICD-10-CM

## 2016-11-03 ENCOUNTER — Encounter: Payer: Self-pay | Admitting: Sports Medicine

## 2016-11-03 ENCOUNTER — Ambulatory Visit (INDEPENDENT_AMBULATORY_CARE_PROVIDER_SITE_OTHER): Payer: Medicare Other | Admitting: Sports Medicine

## 2016-11-03 DIAGNOSIS — M2141 Flat foot [pes planus] (acquired), right foot: Secondary | ICD-10-CM

## 2016-11-03 DIAGNOSIS — M79671 Pain in right foot: Secondary | ICD-10-CM | POA: Diagnosis not present

## 2016-11-03 DIAGNOSIS — B351 Tinea unguium: Secondary | ICD-10-CM

## 2016-11-03 DIAGNOSIS — M21619 Bunion of unspecified foot: Secondary | ICD-10-CM

## 2016-11-03 DIAGNOSIS — E1142 Type 2 diabetes mellitus with diabetic polyneuropathy: Secondary | ICD-10-CM

## 2016-11-03 DIAGNOSIS — M79672 Pain in left foot: Secondary | ICD-10-CM

## 2016-11-03 DIAGNOSIS — M2142 Flat foot [pes planus] (acquired), left foot: Secondary | ICD-10-CM

## 2016-11-03 NOTE — Progress Notes (Signed)
Subjective: Eddie Graves is a 82 y.o. male patient with history of diabetes who returns to office today complaining of long, painful nails  while ambulating in shoes; unable to trim. Patient is blind. Patient states that the glucose reading this morning was not recorded because he is out of strips. Patient denies any new changes in medication or new problems. Patient denies any new cramping, numbness, burning or tingling in the legs.  Patient desires diabetic shoes.  Patient Active Problem List   Diagnosis Date Noted  . Uncontrolled type 2 diabetes mellitus with diabetic polyneuropathy, with long-term current use of insulin (HCC) 03/05/2016  . Tobacco abuse counseling 03/05/2016  . Right sided abdominal pain 03/05/2016  . Constipation 03/05/2016  . Chest pain at rest 11/06/2015  . Diastolic heart failure (HCC) 11/06/2015  . Acute bronchitis with chronic obstructive pulmonary disease (COPD) (HCC) 11/06/2015  . Neurological deficit, transient 11/06/2015  . Left facial numbness   . AKI (acute kidney injury) (HCC)   . Chest pain 05/19/2015  . History of gonorrhea 05/19/2015  . Hypotension 05/19/2015  . CKD (chronic kidney disease), stage III 05/19/2015  . Involuntary muscle contractions 10/12/2014  . Seizure disorder after [facility] 08/19/2014  . Diabetic peripheral neuropathy associated with type 2 diabetes mellitus (HCC) 07/26/2014  . Blind 07/26/2014  . Tobacco abuse 04/26/2014  . Obesity (BMI 30-39.9) 08/18/2013  . COPD exacerbation (HCC) 08/06/2013  . SAH (subarachnoid hemorrhage) (HCC) 05/30/2013  . Coronary atherosclerosis of native coronary artery   . Vitamin D deficiency   . Memory loss   . COPD (chronic obstructive pulmonary disease) (HCC) 03/16/2013  . Essential hypertension, benign 03/16/2013  . Depressive disorder, not elsewhere classified 03/16/2013  . Spinal stenosis, lumbar region, with neurogenic claudication 03/16/2013   Current Outpatient Prescriptions on File Prior to  Visit  Medication Sig Dispense Refill  . amLODipine (NORVASC) 2.5 MG tablet Take one tablet by mouth once daily for blood pressure 90 tablet 3  . Blood Pressure Monitoring (BLOOD PRESSURE CUFF) MISC Check blood sugar once daily as directed DX: Hypertension, patient with blindness and needs a monitor that speaks to him 1 each 0  . Cholecalciferol (VITAMIN D) 2000 units CAPS Take 1 capsule (2,000 Units total) by mouth daily. For vitamin D repletion 30 capsule 11  . CRESTOR 20 MG tablet Take 20 mg by mouth daily. For cholesterol    . esomeprazole (NEXIUM) 40 MG capsule TAKE ONE CAPSULE BY MOUTH ONCE DAILY BEFORE BREAKFAST FOR STOMACH 30 capsule 3  . GARLIC PO Take 3,532 mcg by mouth daily.     Marland Kitchen glucose blood test strip Prodigy test strips, Check blood sugar twice daily DX: E11.65, H54.7 patient with blindness and needs strips with read aloud monitor 100 each 12  . Insulin Lispro Prot & Lispro ([name] 75/25 KWIKPEN) (75-25) 100 UNIT/ML Kwikpen Inject 40 Units into the skin 2 (two) times daily with a meal. 15 mL 11  . levalbuterol (XOPENEX) 1.25 MG/3ML nebulizer solution Take 1.25 mg by nebulization every 4 (four) hours as needed for wheezing. 72 mL 12  . levETIRAcetam (KEPPRA XR) 500 MG 24 hr tablet TAKE TWO   TABLETS BY MOUTH   DAILY 180 tablet 1  . linagliptin (TRADJENTA) 5 MG TABS tablet Take one tablet by mouth once daily to control blood sugar 30 tablet 5  . lisinopril (PRINIVIL,ZESTRIL) 10 MG tablet TAKE ONE TABLET   BY MOUTH   DAILY   FOR BLOOD PRESSURE 90 tablet 3  .  meloxicam (MOBIC) 7.5 MG tablet TAKE 1 TABLET (7.5 MG TOTAL) BY MOUTH DAILY. 30 tablet 3  . metoprolol succinate (TOPROL-XL) 100 MG 24 hr tablet Take one tablet by mouth once daily for blood pressure 90 tablet 3  . nitroGLYCERIN (NITRODUR - DOSED IN MG/24 HR) 0.4 mg/hr Place 1 patch onto the skin daily. For chest pain    . Omega 3 1200 MG CAPS Take 2,400 mg by mouth daily. For cholesterol    . oxyCODONE (ROXICODONE) 5 MG  immediate release tablet Take 1 tablet (5 mg total) by mouth 2 (two) times daily as needed for severe pain. 60 tablet 0  . PROAIR HFA 108 (90 Base) MCG/ACT inhaler INHALE TWO   PUFFS INTO THE LUNGS EVERY   TWO   HOURS IF NEEDED 17 Inhaler 1  . sertraline (ZOLOFT) 50 MG tablet TAKE ONE TABLET   BY MOUTH   DAILY 30 tablet 5  . TRADJENTA 5 MG TABS tablet TAKE ONE TABLET   BY MOUTH   DAILY   FOR DIABETES 30 tablet 5   No current facility-administered medications on file prior to visit.    Allergies  Allergen Reactions  . Flexeril [Cyclobenzaprine] Other (See Comments)    delirium  . Nsaids Other (See Comments)    Renal failure    Recent Results (from the past 2160 hour(s))  Hemoglobin A1c     Status: Abnormal   Collection Time: 08/12/16  9:52 AM  Result Value Ref Range   Hgb A1c MFr Bld 8.1 (H) <5.7 %    Comment:   For someone without known diabetes, a hemoglobin A1c value of 6.5% or greater indicates that they may have diabetes and this should be confirmed with a follow-up test.   For someone with known diabetes, a value <7% indicates that their diabetes is well controlled and a value greater than or equal to 7% indicates suboptimal control. A1c targets should be individualized based on duration of diabetes, age, comorbid conditions, and other considerations.   Currently, no consensus exists for use of hemoglobin A1c for diagnosis of diabetes for children.      Mean Plasma Glucose 186 mg/dL  CMP     Status: Abnormal   Collection Time: 08/12/16  9:52 AM  Result Value Ref Range   Sodium 139 135 - 146 mmol/L   Potassium 4.4 3.5 - 5.3 mmol/L   Chloride 103 98 - 110 mmol/L   CO2 24 20 - 31 mmol/L   Glucose, Bld 160 (H) 65 - 99 mg/dL   BUN 10 7 - 25 mg/dL   Creat 0.24 (H) 0.97 - 1.11 mg/dL    Comment:   For patients > or = 80 years of age: The upper reference limit for Creatinine is approximately 13% higher for people identified as African-American.      Total Bilirubin 0.5  0.2 - 1.2 mg/dL   Alkaline Phosphatase 101 40 - 115 U/L   AST 24 10 - 35 U/L   ALT 11 9 - 46 U/L   Total Protein 7.3 6.1 - 8.1 g/dL   Albumin 4.1 3.6 - 5.1 g/dL   Calcium 35.3 8.6 - 29.9 mg/dL  Microalbumin/Creatinine Ratio, Urine     Status: Abnormal   Collection Time: 08/13/16  3:38 PM  Result Value Ref Range   Creatinine, Urine 486 (H) 20 - 370 mg/dL    Comment: Result repeated and verified. Result confirmed by automatic dilution.    Microalb, Ur 13.1 Not estab mg/dL  Microalb Creat Ratio 27 <30 mcg/mg creat    Comment: The ADA has defined abnormalities in albumin excretion as follows:           Category           Result                            (mcg/mg creatinine)                 Normal:    <30       Microalbuminuria:    30 - 299   Clinical albuminuria:    > or = 300   The ADA recommends that at least two of three specimens collected within a 3 - 6 month period be abnormal before considering a patient to be within a diagnostic category.     COMPLETE METABOLIC PANEL WITH GFR     Status: Abnormal   Collection Time: 10/01/16  1:51 PM  Result Value Ref Range   Sodium 140 135 - 146 mmol/L   Potassium 4.4 3.5 - 5.3 mmol/L   Chloride 106 98 - 110 mmol/L   CO2 26 20 - 31 mmol/L   Glucose, Bld 136 (H) 65 - 99 mg/dL   BUN 9 7 - 25 mg/dL   Creat 1.93 (H) 7.90 - 1.11 mg/dL    Comment:   For patients > or = 80 years of age: The upper reference limit for Creatinine is approximately 13% higher for people identified as African-American.      Total Bilirubin 0.4 0.2 - 1.2 mg/dL   Alkaline Phosphatase 94 40 - 115 U/L   AST 26 10 - 35 U/L   ALT 12 9 - 46 U/L   Total Protein 7.1 6.1 - 8.1 g/dL   Albumin 3.8 3.6 - 5.1 g/dL   Calcium 9.8 8.6 - 24.0 mg/dL   GFR, Est African American 56 (L) >=60 mL/min   GFR, Est Non African American 49 (L) >=60 mL/min    Objective: General: Patient is awake, alert, and oriented x 3 and in no acute distress.  Integument: Skin is warm, dry  and supple bilateral. Nails are tender, long, thickened and dystrophic with subungual debris, consistent with onychomycosis, 1-5 bilateral. No signs of infection. No open lesions or preulcerative lesions present bilateral. Remaining integument unremarkable.  Vasculature:  Dorsalis Pedis pulse 1/4 bilateral. Posterior Tibial pulse  1/4 bilateral. Capillary fill time <3 sec 1-5 bilateral. Scant hair growth to the level of the digits.Temperature gradient within normal limits. No varicosities present bilateral. No edema present bilateral.   Neurology: The patient has intact sensation measured with a 5.07/10g Semmes Weinstein Monofilament at all pedal sites bilateral . Vibratory sensation diminished bilateral with tuning fork. No Babinski sign present bilateral.   Musculoskeletal: Asymptomatic bunion and pes planus pedal deformities noted bilateral. Muscular strength 5/5 in all lower extremity muscular groups bilateral without pain on range of motion. No tenderness with calf compression bilateral.  Assessment and Plan: Problem List Items Addressed This Visit    None    Visit Diagnoses    Dermatophytosis of nail    -  Primary   Foot pain, bilateral       Diabetic polyneuropathy associated with type 2 diabetes mellitus (HCC)       Bunion       Pes planus of both feet         -Examined patient. -Discussed and educated patient on  diabetic foot care, especially with regards to the vascular, neurological and musculoskeletal systems.  -Stressed the importance of good glycemic control and the detriment of not controlling glucose levels in relation to the foot. -Mechanically debrided all nails 1-5 bilateral using sterile nail nipper and filed with dremel without incident  -Safe step diabetic shoe order form was completed; office to contact primary care for approval / certification;  Office to arrange shoe fitting and dispensing. -Answered all patient questions -Patient to return  in 3 months for at risk  foot care -Patient advised to call the office if any problems or questions arise in the meantime.  Asencion Islam, DPM

## 2016-11-04 ENCOUNTER — Telehealth: Payer: Self-pay | Admitting: *Deleted

## 2016-11-04 NOTE — Telephone Encounter (Signed)
Received fax from [facility] for Prior authorization for patient's Prodigy No Coding Blood Glucose Strips. Initiated Prior Authorization through [employer] My Meds Key:[id number]. [facility] is reviewing the PA request and we will have a response within 72 hours.

## 2016-11-04 NOTE — Telephone Encounter (Signed)
Received Fax from [employer] [facility] stating Prodigy is APPROVED through 12/06/2017.  Member ID: [id number] Request Reference #: [id number]

## 2016-11-05 ENCOUNTER — Telehealth: Payer: Self-pay

## 2016-11-05 DIAGNOSIS — I25118 Atherosclerotic heart disease of native coronary artery with other forms of angina pectoris: Secondary | ICD-10-CM | POA: Diagnosis not present

## 2016-11-05 DIAGNOSIS — R0609 Other forms of dyspnea: Secondary | ICD-10-CM | POA: Diagnosis not present

## 2016-11-05 DIAGNOSIS — I10 Essential (primary) hypertension: Secondary | ICD-10-CM | POA: Diagnosis not present

## 2016-11-05 DIAGNOSIS — E785 Hyperlipidemia, unspecified: Secondary | ICD-10-CM | POA: Diagnosis not present

## 2016-11-05 DIAGNOSIS — E119 Type 2 diabetes mellitus without complications: Secondary | ICD-10-CM | POA: Diagnosis not present

## 2016-11-05 NOTE — Telephone Encounter (Signed)
Ok, we'll monitor him for any GI bleeding.  He'd also had the bleeding on the brain after a fall at one point.

## 2016-11-05 NOTE — Telephone Encounter (Signed)
Heart Specialist put patient back on 1/2 of 81mg  ASA daily. Patient called to update his medication list.  Med list updated

## 2016-11-09 DIAGNOSIS — E119 Type 2 diabetes mellitus without complications: Secondary | ICD-10-CM | POA: Diagnosis not present

## 2016-11-09 DIAGNOSIS — R0609 Other forms of dyspnea: Secondary | ICD-10-CM | POA: Diagnosis not present

## 2016-11-09 DIAGNOSIS — I10 Essential (primary) hypertension: Secondary | ICD-10-CM | POA: Diagnosis not present

## 2016-11-09 DIAGNOSIS — I25118 Atherosclerotic heart disease of native coronary artery with other forms of angina pectoris: Secondary | ICD-10-CM | POA: Diagnosis not present

## 2016-11-09 DIAGNOSIS — E785 Hyperlipidemia, unspecified: Secondary | ICD-10-CM | POA: Diagnosis not present

## 2016-11-18 ENCOUNTER — Other Ambulatory Visit: Payer: Self-pay | Admitting: *Deleted

## 2016-11-18 DIAGNOSIS — M5030 Other cervical disc degeneration, unspecified cervical region: Secondary | ICD-10-CM

## 2016-11-18 MED ORDER — OXYCODONE HCL 5 MG PO TABS: 5.0000 mg | ORAL_TABLET | Freq: Two times a day (BID) | ORAL | 0 refills | Status: DC | PRN

## 2016-11-18 NOTE — Telephone Encounter (Signed)
Patient requested and will pick up 

## 2016-11-23 ENCOUNTER — Ambulatory Visit (INDEPENDENT_AMBULATORY_CARE_PROVIDER_SITE_OTHER): Payer: Medicare Other | Admitting: Pharmacotherapy

## 2016-11-23 ENCOUNTER — Other Ambulatory Visit: Payer: Self-pay

## 2016-11-23 VITALS — BP 142/96 | HR 76 | Temp 97.1°F | Ht 66.0 in | Wt 211.6 lb

## 2016-11-23 DIAGNOSIS — E1165 Type 2 diabetes mellitus with hyperglycemia: Principal | ICD-10-CM

## 2016-11-23 DIAGNOSIS — IMO0002 Reserved for concepts with insufficient information to code with codable children: Secondary | ICD-10-CM

## 2016-11-23 DIAGNOSIS — I10 Essential (primary) hypertension: Secondary | ICD-10-CM | POA: Diagnosis not present

## 2016-11-23 DIAGNOSIS — Z794 Long term (current) use of insulin: Principal | ICD-10-CM

## 2016-11-23 DIAGNOSIS — E1142 Type 2 diabetes mellitus with diabetic polyneuropathy: Secondary | ICD-10-CM

## 2016-11-23 NOTE — Progress Notes (Signed)
  Subjective:    Eddie Graves is a 82 y.o.African American male who presents for follow-up of Type 2 diabetes mellitus.   Last A1C was 8.1% (08/12/2016) Did not get labs prior to OV. Currently on 75/25 Mix 40 units BID.  Did not bring blood glucose meter. Has not been checking recently.  Depends on someone to help him. He says recent BG was 110 range. Hypoglycemia x 1 (low 70's)  He is not eating a healthy diet. No routine exercise.  He is blind. He is seeing podiatrist for foot care.  Pain in bottom of feet and in toes. Some peripheral edema. Nocturia 3-4 times per night. No dysuria Needs to drink more water.    Review of Systems A comprehensive review of systems was negative except for: Eyes: positive for he is blind Cardiovascular: positive for lower extremity edema Genitourinary: positive for nocturia Neurological: positive for peripheral neuropathy    Objective:    BP (!) 142/96   Pulse 76   Temp 97.1 F (36.2 C) (Oral)   Ht 5' 6"$  (1.676 m)   Wt 211 lb 9.6 oz (96 kg)   SpO2 94%   BMI 34.15 kg/m   General:  alert, cooperative and no distress  Oropharynx: normal findings: lips normal without lesions and gums healthy   Eyes:  negative findings: lids and lashes normal, positive findings: visual field defect he is blind   Ears:  external ears normall        Lung: clear to auscultation bilaterally  Heart:  regular rate and rhythm     Extremities: edema trace edema bilateraly  Skin: dry     Neuro: mental status, speech normal, alert and oriented x3 and gait and station normal   Lab Review Glucose (mg/dL)  Date Value  03/05/2016 92  11/14/2015 88   Glucose, Bld (mg/dL)  Date Value  10/01/2016 136 (H)  08/12/2016 160 (H)  11/06/2015 144 (H)   CO2 (mmol/L)  Date Value  10/01/2016 26  08/12/2016 24  03/05/2016 24   BUN (mg/dL)  Date Value  10/01/2016 9  08/12/2016 10  03/05/2016 9  11/14/2015 21  11/06/2015 12  08/30/2015 21   Creat (mg/dL)   Date Value  10/01/2016 1.34 (H)  08/12/2016 1.26 (H)   Creatinine, Ser (mg/dL)  Date Value  03/05/2016 1.27  11/14/2015 1.33 (H)  11/06/2015 1.28 (H)       Assessment:    Diabetes Mellitus type II, under fair control. Last A1C was 8.1%.  Will recheck today. BP above goal <140/90   Plan:    1.  Rx changes: none  2.  Continue Humalog 75/25 Mix 40 units BID for now. 3.  Counseled on nutrition goals. 4.  Counseled on need for routine exercise.  Goal is 30-45 minutes 5 x week. 5.  Counseled on foot care. 6.  BP above goal today, usually OK.  Will continue current RX and monitor.

## 2016-11-24 LAB — COMPLETE METABOLIC PANEL WITH GFR
ALT: 10 U/L (ref 9–46)
AST: 21 U/L (ref 10–35)
Albumin: 4.1 g/dL (ref 3.6–5.1)
Alkaline Phosphatase: 86 U/L (ref 40–115)
BUN: 20 mg/dL (ref 7–25)
CO2: 24 mmol/L (ref 20–31)
Calcium: 9.4 mg/dL (ref 8.6–10.3)
Chloride: 103 mmol/L (ref 98–110)
Creat: 1.52 mg/dL — ABNORMAL HIGH (ref 0.70–1.11)
GFR, Est African American: 48 mL/min — ABNORMAL LOW (ref >=60)
GFR, Est Non African American: 42 mL/min — ABNORMAL LOW (ref >=60)
Glucose, Bld: 233 mg/dL — ABNORMAL HIGH (ref 65–99)
Potassium: 4.6 mmol/L (ref 3.5–5.3)
Sodium: 136 mmol/L (ref 135–146)
Total Bilirubin: 0.4 mg/dL (ref 0.2–1.2)
Total Protein: 7.2 g/dL (ref 6.1–8.1)

## 2016-11-24 LAB — MICROALBUMIN / CREATININE URINE RATIO
Creatinine, Urine: 301 mg/dL (ref 20–370)
Microalb Creat Ratio: 72 mcg/mg creat — ABNORMAL HIGH (ref <30)
Microalb, Ur: 21.6 mg/dL

## 2016-11-24 LAB — HEMOGLOBIN A1C
Hgb A1c MFr Bld: 6.8 % — ABNORMAL HIGH (ref <5.7)
Mean Plasma Glucose: 148 mg/dL

## 2016-11-26 ENCOUNTER — Ambulatory Visit (INDEPENDENT_AMBULATORY_CARE_PROVIDER_SITE_OTHER): Payer: Medicare Other

## 2016-11-26 ENCOUNTER — Encounter: Payer: Medicare Other | Admitting: Internal Medicine

## 2016-11-26 VITALS — BP 140/60 | HR 76 | Temp 98.0°F | Ht 66.0 in | Wt 217.8 lb

## 2016-11-26 DIAGNOSIS — Z0000 Encounter for general adult medical examination without abnormal findings: Secondary | ICD-10-CM

## 2016-11-26 NOTE — Progress Notes (Signed)
   I reviewed health advisor's note, was available for consultation and agree with the assessment and plan as written.  Will schedule appt with me asap about his chest pain  Eddie Graves L. Coleston Dirosa, D.O. Geriatrics [facility] [address] [place], [place] [address] Cell Phone (Mon-Fri 8am-5pm):  [phone] On Call:  [phone] & follow prompts after 5pm & weekends Office Phone:  [phone] Office Fax:  [phone]   Quick Notes   Health Maintenance:  Up to date on vaccines; Pt unable to afford Zostavax.     Abnormal Screen: None; Unable to complete MMSE    Patient Concerns:  Pt c/o hip pain on both sides. Pt also voices he has been having chest pain; "I will call my cardiologist after Christmas."     Nurse Concerns:  Pt's chest pain.

## 2016-11-26 NOTE — Patient Instructions (Addendum)
[name] , Thank you for taking time to come for your Medicare Wellness Visit. I appreciate your ongoing commitment to your health goals. Please review the following plan we discussed and let me know if I can assist you in the future.   These are the goals we discussed: Goals    . Quit smoking / using tobacco    . Quit smoking / using tobacco          I would still like to quit smoking; over the past year, I have decreased the amount of cigs I smoke daily.        This is a list of the screening recommended for you and due dates:  Health Maintenance  Topic Date Due  . Eye exam for diabetics  12/06/2017*  . Shingles Vaccine  12/06/2017*  . Complete foot exam   05/11/2017  . Hemoglobin A1C  05/24/2017  . Tetanus Vaccine  12/30/2021  . Flu Shot  Completed  . Pneumonia vaccines  Completed  *Topic was postponed. The date shown is not the original due date.  Preventive Care for Adults  A healthy lifestyle and preventive care can promote health and wellness. Preventive health guidelines for adults include the following key practices.  . A routine yearly physical is a good way to check with your health care provider about your health and preventive screening. It is a chance to share any concerns and updates on your health and to receive a thorough exam.  . Visit your dentist for a routine exam and preventive care every 6 months. Brush your teeth twice a day and floss once a day. Good oral hygiene prevents tooth decay and gum disease.  . The frequency of eye exams is based on your age, health, family medical history, use  of contact lenses, and other factors. Follow your health care provider's ecommendations for frequency of eye exams.  . Eat a healthy diet. Foods like vegetables, fruits, whole grains, low-fat dairy products, and lean protein foods contain the nutrients you need without too many calories. Decrease your intake of foods high in solid fats, added sugars, and salt. Eat the  right amount of calories for you. Get information about a proper diet from your health care provider, if necessary.  . Regular physical exercise is one of the most important things you can do for your health. Most adults should get at least 150 minutes of moderate-intensity exercise (any activity that increases your heart rate and causes you to sweat) each week. In addition, most adults need muscle-strengthening exercises on 2 or more days a week.  Silver Sneakers may be a benefit available to you. To determine eligibility, you may visit the website: www.silversneakers.com or contact program at [phone]) [phone] Mon-Fri between 8AM-8PM.   . Maintain a healthy weight. The body mass index (BMI) is a screening tool to identify possible weight problems. It provides an estimate of body fat based on height and weight. Your health care provider can find your BMI and can help you achieve or maintain a healthy weight.   For adults 20 years and older: ? A BMI below 18.5 is considered underweight. ? A BMI of 18.5 to 24.9 is normal. ? A BMI of 25 to 29.9 is considered overweight. ? A BMI of 30 and above is considered obese.   . Maintain normal blood lipids and cholesterol levels by exercising and minimizing your intake of saturated fat. Eat a balanced diet with plenty of fruit and vegetables. Blood tests  for lipids and cholesterol should begin at age 30 and be repeated every 5 years. If your lipid or cholesterol levels are high, you are over 50, or you are at high risk for heart disease, you may need your cholesterol levels checked more frequently. Ongoing high lipid and cholesterol levels should be treated with medicines if diet and exercise are not working.  . If you smoke, find out from your health care provider how to quit. If you do not use tobacco, please do not start.  . If you choose to drink alcohol, please do not consume more than 2 drinks per day. One drink is considered to be 12 ounces (355 mL) of  beer, 5 ounces (148 mL) of wine, or 1.5 ounces (44 mL) of liquor.  . If you are 20-25 years old, ask your health care provider if you should take aspirin to prevent strokes.  . Use sunscreen. Apply sunscreen liberally and repeatedly throughout the day. You should seek shade when your shadow is shorter than you. Protect yourself by wearing long sleeves, pants, a wide-brimmed hat, and sunglasses year round, whenever you are outdoors.  . Once a month, do a whole body skin exam, using a mirror to look at the skin on your back. Tell your health care provider of new moles, moles that have irregular borders, moles that are larger than a pencil eraser, or moles that have changed in shape or color.

## 2016-11-26 NOTE — Progress Notes (Signed)
Subjective:   Eddie Graves is a 82 y.o. male who presents for an Initial Medicare Annual Wellness Visit.  Review of Systems  Cardiac Risk Factors include: advanced age (>19men, >25 women);diabetes mellitus;family history of premature cardiovascular disease;hypertension;male gender;obesity (BMI >30kg/m2);sedentary lifestyle;smoking/ tobacco exposure    Objective:    Today's Vitals   11/26/16 1543 11/26/16 1550  BP: 140/60   Pulse: 76   Temp: 98 F (36.7 C)   TempSrc: Oral   SpO2: 99%   Weight: 217 lb 12.8 oz (98.8 kg)   Height: 5\' 6"  (1.676 m)   PainSc: 6  6    Body mass index is 35.15 kg/m.  Current Medications (verified) Outpatient Encounter Prescriptions as of 11/26/2016  Medication Sig  . amLODipine (NORVASC) 2.5 MG tablet Take one tablet by mouth once daily for blood pressure  . aspirin EC 81 MG tablet 1/2 tablet by mouth daily per Cardiologist  . Blood Pressure Monitoring (BLOOD PRESSURE CUFF) MISC Check blood sugar once daily as directed DX: Hypertension, patient with blindness and needs a monitor that speaks to him  . Cholecalciferol (VITAMIN D) 2000 units CAPS Take 1 capsule (2,000 Units total) by mouth daily. For vitamin D repletion  . CRESTOR 20 MG tablet Take 20 mg by mouth daily. For cholesterol  . esomeprazole (NEXIUM) 40 MG capsule TAKE ONE CAPSULE BY MOUTH ONCE DAILY BEFORE BREAKFAST FOR STOMACH  . GARLIC PO Take 11/28/2016 mcg by mouth daily.   1,607 glucose blood test strip Prodigy test strips, Check blood sugar twice daily DX: E11.65, H54.7 patient with blindness and needs strips with read aloud monitor  . Insulin Lispro Prot & Lispro ([name] 75/25 KWIKPEN) (75-25) 100 UNIT/ML Kwikpen Inject 40 Units into the skin 2 (two) times daily with a meal.  . levalbuterol (XOPENEX) 1.25 MG/3ML nebulizer solution Take 1.25 mg by nebulization every 4 (four) hours as needed for wheezing.  . levETIRAcetam (KEPPRA XR) 500 MG 24 hr tablet TAKE TWO   TABLETS BY MOUTH   DAILY    . linagliptin (TRADJENTA) 5 MG TABS tablet Take one tablet by mouth once daily to control blood sugar  . lisinopril (PRINIVIL,ZESTRIL) 10 MG tablet TAKE ONE TABLET   BY MOUTH   DAILY   FOR BLOOD PRESSURE  . meloxicam (MOBIC) 7.5 MG tablet TAKE 1 TABLET (7.5 MG TOTAL) BY MOUTH DAILY.  . metoprolol succinate (TOPROL-XL) 100 MG 24 hr tablet Take one tablet by mouth once daily for blood pressure  . nitroGLYCERIN (NITRODUR - DOSED IN MG/24 HR) 0.4 mg/hr Place 1 patch onto the skin daily. For chest pain  . Omega 3 1200 MG CAPS Take 2,400 mg by mouth daily. For cholesterol  . oxyCODONE (ROXICODONE) 5 MG immediate release tablet Take 1 tablet (5 mg total) by mouth 2 (two) times daily as needed for severe pain.  Marland Kitchen PROAIR HFA 108 (90 Base) MCG/ACT inhaler INHALE TWO   PUFFS INTO THE LUNGS EVERY   TWO   HOURS IF NEEDED  . sertraline (ZOLOFT) 50 MG tablet TAKE ONE TABLET   BY MOUTH   DAILY  . TRADJENTA 5 MG TABS tablet TAKE ONE TABLET   BY MOUTH   DAILY   FOR DIABETES   No facility-administered encounter medications on file as of 11/26/2016.     Allergies (verified) Flexeril [cyclobenzaprine] and Nsaids   History: Past Medical History:  Diagnosis Date  . Anxiety state, unspecified   . Blind   . Chronic airway obstruction, not elsewhere  classified   . Coronary atherosclerosis of native coronary artery   . Cough   . Depressive disorder, not elsewhere classified   . DM (diabetes mellitus) type II controlled peripheral vascular disorder (HCC)   . Essential hypertension, benign   . H/O hiatal hernia   . Headache(784.0)   . Legal blindness, as defined in [place]   . Memory loss   . Other and unspecified hyperlipidemia   . Pain in joint, site unspecified   . Palpitations   . Rash and other nonspecific skin eruption   . Reflux esophagitis   . Sebaceous cyst   . Spinal stenosis, lumbar region, with neurogenic claudication   . Type II diabetes mellitus with neurological manifestations, uncontrolled  (HCC)   . Unspecified constipation   . Vitamin D deficiency    Past Surgical History:  Procedure Laterality Date  . CORONARY ANGIOPLASTY WITH STENT PLACEMENT    . EYE SURGERY     right prosthetic globe  . HERNIA REPAIR     Family History  Problem Relation Age of Onset  . Asthma Mother   . Diabetes Son   . Cancer Daughter   . Diabetes Son    Social History   Occupational History  . Not on file.   Social History Main Topics  . Smoking status: Light Tobacco Smoker    Years: 50.00    Types: Cigarettes    Last attempt to quit: 03/07/2013  . Smokeless tobacco: Never Used     Comment: Patient say he smokes 4 cigs. per day  . Alcohol use No  . Drug use: No  . Sexual activity: Not Currently    Birth control/ protection: None   Tobacco Counseling Ready to quit: No Counseling given: No   Activities of Daily Living In your present state of health, do you have any difficulty performing the following activities: 11/26/2016 06/29/2016  Hearing? [name]  Vision? Y Y  Difficulty concentrating or making decisions? [name]  Walking or climbing stairs? Y N  Dressing or bathing? N N  Doing errands, shopping? N Y  Quarry manager and eating ? N Y  Using the Toilet? N N  In the past six months, have you accidently leaked urine? N N  Do you have problems with loss of bowel control? N N  Managing your Medications? Y Y  Managing your Finances? [name]  Housekeeping or managing your Housekeeping? [name]  Some recent data might be hidden    Immunizations and Health Maintenance Immunization History  Administered Date(s) Administered  . Influenza Split 12/31/2011  . Influenza,inj,Quad PF,36+ Mos 08/18/2013, 08/27/2014, 08/16/2015, 08/17/2016  . Pneumococcal Conjugate-13 11/29/2014  . Pneumococcal Polysaccharide-23 12/25/2010  . Tdap 12/31/2011   There are no preventive care reminders to display for this patient.  Patient Care Team: Kermit Balo, DO as PCP - General (Geriatric Medicine) Rinaldo Cloud, MD as Consulting Physician (Cardiology) Myrtie Neither, MD as Consulting Physician (Orthopedic Surgery)  Indicate any recent Medical Services you may have received from other than [facility] providers in the past year (date may be approximate).    Assessment:   This is a routine wellness examination for Abed.   Hearing/Vision screen  Hearing Screening   125Hz  250Hz  500Hz  1000Hz  2000Hz  3000Hz  4000Hz  6000Hz  8000Hz   Right ear:   100 100 100  100    Left ear:   100 0 100  100    Comments: Pt has not had a hearing screen in several years. Pt  voices that he has had a decrease.   Vision Screening Comments: Pt is blind.   Dietary issues and exercise activities discussed: Current Exercise Habits: The patient does not participate in regular exercise at present  Goals    . Quit smoking / using tobacco    . Quit smoking / using tobacco          I would still like to quit smoking; over the past year, I have decreased the amount of cigs I smoke daily.       Depression Screen PHQ 2/9 Scores 11/26/2016 06/29/2016 06/01/2016 03/05/2016  PHQ - 2 Score 4 4 2  0  PHQ- 9 Score 21 4 5  -    Fall Risk Fall Risk  11/26/2016 08/17/2016 06/29/2016 06/01/2016 05/11/2016  Falls in the past year? Yes Yes Yes No No  Number falls in past yr: 1 1 1  - -  Injury with Fall? No No No - -  Risk for fall due to : Impaired balance/gait;Impaired mobility;Impaired vision - - - -  Follow up Falls prevention discussed - Falls prevention discussed - -    Cognitive Function: MMSE - Mini Mental State Exam 11/26/2016 01/25/2014  Not completed: Unable to complete -  Orientation to time - 5  Orientation to Place - 5  Registration - 3  Attention/ Calculation - 5  Recall - 0  Language- name 2 objects - 0  Language- name 2 objects-comments - Omitted, patient blind   Language- repeat - 1  Language- follow 3 step command - 3  Language- read & follow direction - 0  Language-read & follow direction-comments - Omitted, patient  blind   Write a sentence - 0  Write a sentence-comments - Omitted, patient blind   Copy design - 0  Copy design-comments - Omitted, patient blind   Total score - 22        Screening Tests Health Maintenance  Topic Date Due  . OPHTHALMOLOGY EXAM  12/06/2017 (Originally 09/07/2015)  . ZOSTAVAX  12/06/2017 (Originally 06/02/1993)  . FOOT EXAM  05/11/2017  . HEMOGLOBIN A1C  05/24/2017  . TETANUS/TDAP  12/30/2021  . INFLUENZA VACCINE  Completed  . PNA vac Low Risk Adult  Completed        Plan:    I have personally reviewed and addressed the Medicare Annual Wellness questionnaire and have noted the following in the patient's chart:  A. Medical and social history B. Use of alcohol, tobacco or illicit drugs  C. Current medications and supplements D. Functional ability and status E.  Nutritional status F.  Physical activity G. Advance directives H. List of other physicians I.  Hospitalizations, surgeries, and ER visits in previous 12 months J.  Vitals K. Screenings to include hearing, vision, cognitive, depression L. Referrals and appointments - none  In addition, I have reviewed and discussed with patient certain preventive protocols, quality metrics, and best practice recommendations. A written personalized care plan for preventive services as well as general preventive health recommendations were provided to patient.  See attached scanned questionnaire for additional information.   Signed,   07/11/2017, LPN Health Advisor

## 2016-12-06 NOTE — Progress Notes (Signed)
This encounter was created in error - please disregard.

## 2016-12-10 ENCOUNTER — Ambulatory Visit: Payer: Medicare Other | Admitting: Internal Medicine

## 2016-12-12 ENCOUNTER — Inpatient Hospital Stay (HOSPITAL_COMMUNITY)
Admission: EM | Admit: 2016-12-12 | Discharge: 2016-12-16 | DRG: 281 | Disposition: A | Discharge status: Home Health | Payer: Medicare Other | Attending: Cardiology | Admitting: Cardiology

## 2016-12-12 ENCOUNTER — Emergency Department (HOSPITAL_COMMUNITY): Payer: Medicare Other

## 2016-12-12 ENCOUNTER — Encounter (HOSPITAL_COMMUNITY): Payer: Self-pay | Admitting: Emergency Medicine

## 2016-12-12 DIAGNOSIS — J441 Chronic obstructive pulmonary disease with (acute) exacerbation: Secondary | ICD-10-CM | POA: Diagnosis not present

## 2016-12-12 DIAGNOSIS — Z955 Presence of coronary angioplasty implant and graft: Secondary | ICD-10-CM | POA: Diagnosis not present

## 2016-12-12 DIAGNOSIS — I2510 Atherosclerotic heart disease of native coronary artery without angina pectoris: Secondary | ICD-10-CM | POA: Diagnosis present

## 2016-12-12 DIAGNOSIS — E1140 Type 2 diabetes mellitus with diabetic neuropathy, unspecified: Secondary | ICD-10-CM | POA: Diagnosis not present

## 2016-12-12 DIAGNOSIS — I5032 Chronic diastolic (congestive) heart failure: Secondary | ICD-10-CM | POA: Diagnosis present

## 2016-12-12 DIAGNOSIS — R079 Chest pain, unspecified: Secondary | ICD-10-CM | POA: Diagnosis not present

## 2016-12-12 DIAGNOSIS — I130 Hypertensive heart and chronic kidney disease with heart failure and stage 1 through stage 4 chronic kidney disease, or unspecified chronic kidney disease: Secondary | ICD-10-CM | POA: Diagnosis present

## 2016-12-12 DIAGNOSIS — R069 Unspecified abnormalities of breathing: Secondary | ICD-10-CM | POA: Diagnosis not present

## 2016-12-12 DIAGNOSIS — R7989 Other specified abnormal findings of blood chemistry: Secondary | ICD-10-CM | POA: Diagnosis not present

## 2016-12-12 DIAGNOSIS — Z79899 Other long term (current) drug therapy: Secondary | ICD-10-CM | POA: Diagnosis not present

## 2016-12-12 DIAGNOSIS — Z825 Family history of asthma and other chronic lower respiratory diseases: Secondary | ICD-10-CM | POA: Diagnosis not present

## 2016-12-12 DIAGNOSIS — E785 Hyperlipidemia, unspecified: Secondary | ICD-10-CM | POA: Diagnosis present

## 2016-12-12 DIAGNOSIS — E1122 Type 2 diabetes mellitus with diabetic chronic kidney disease: Secondary | ICD-10-CM | POA: Diagnosis present

## 2016-12-12 DIAGNOSIS — Z7984 Long term (current) use of oral hypoglycemic drugs: Secondary | ICD-10-CM | POA: Diagnosis not present

## 2016-12-12 DIAGNOSIS — M1990 Unspecified osteoarthritis, unspecified site: Secondary | ICD-10-CM | POA: Diagnosis present

## 2016-12-12 DIAGNOSIS — I214 Non-ST elevation (NSTEMI) myocardial infarction: Secondary | ICD-10-CM | POA: Diagnosis not present

## 2016-12-12 DIAGNOSIS — G40909 Epilepsy, unspecified, not intractable, without status epilepticus: Secondary | ICD-10-CM | POA: Diagnosis present

## 2016-12-12 DIAGNOSIS — Z7982 Long term (current) use of aspirin: Secondary | ICD-10-CM | POA: Diagnosis not present

## 2016-12-12 DIAGNOSIS — R778 Other specified abnormalities of plasma proteins: Secondary | ICD-10-CM

## 2016-12-12 DIAGNOSIS — I200 Unstable angina: Secondary | ICD-10-CM | POA: Diagnosis not present

## 2016-12-12 DIAGNOSIS — Z833 Family history of diabetes mellitus: Secondary | ICD-10-CM | POA: Diagnosis not present

## 2016-12-12 DIAGNOSIS — Z886 Allergy status to analgesic agent status: Secondary | ICD-10-CM | POA: Diagnosis not present

## 2016-12-12 DIAGNOSIS — Z888 Allergy status to other drugs, medicaments and biological substances status: Secondary | ICD-10-CM

## 2016-12-12 DIAGNOSIS — I129 Hypertensive chronic kidney disease with stage 1 through stage 4 chronic kidney disease, or unspecified chronic kidney disease: Secondary | ICD-10-CM | POA: Diagnosis not present

## 2016-12-12 DIAGNOSIS — I252 Old myocardial infarction: Secondary | ICD-10-CM | POA: Diagnosis not present

## 2016-12-12 DIAGNOSIS — N182 Chronic kidney disease, stage 2 (mild): Secondary | ICD-10-CM | POA: Diagnosis present

## 2016-12-12 DIAGNOSIS — E119 Type 2 diabetes mellitus without complications: Secondary | ICD-10-CM | POA: Diagnosis not present

## 2016-12-12 DIAGNOSIS — Z791 Long term (current) use of non-steroidal anti-inflammatories (NSAID): Secondary | ICD-10-CM | POA: Diagnosis not present

## 2016-12-12 DIAGNOSIS — F17210 Nicotine dependence, cigarettes, uncomplicated: Secondary | ICD-10-CM | POA: Diagnosis present

## 2016-12-12 LAB — COMPREHENSIVE METABOLIC PANEL
ALT: 17 U/L (ref 17–63)
AST: 45 U/L — ABNORMAL HIGH (ref 15–41)
Albumin: 3.7 g/dL (ref 3.5–5.0)
Alkaline Phosphatase: 96 U/L (ref 38–126)
Anion gap: 13 (ref 5–15)
BUN: 10 mg/dL (ref 6–20)
CHLORIDE: 103 mmol/L (ref 101–111)
CO2: 21 mmol/L — AB (ref 22–32)
CREATININE: 1.36 mg/dL — AB (ref 0.61–1.24)
Calcium: 9.4 mg/dL (ref 8.9–10.3)
GFR calc non Af Amer: 46 mL/min — ABNORMAL LOW (ref >60)
GFR, EST AFRICAN AMERICAN: 54 mL/min — AB (ref >60)
Glucose, Bld: 250 mg/dL — ABNORMAL HIGH (ref 65–99)
POTASSIUM: 4.3 mmol/L (ref 3.5–5.1)
SODIUM: 137 mmol/L (ref 135–145)
Total Bilirubin: 0.3 mg/dL (ref 0.3–1.2)
Total Protein: 7.5 g/dL (ref 6.5–8.1)

## 2016-12-12 LAB — BRAIN NATRIURETIC PEPTIDE: B NATRIURETIC PEPTIDE 5: 46.7 pg/mL (ref 0.0–100.0)

## 2016-12-12 LAB — I-STAT TROPONIN, ED: Troponin i, poc: 0.29 ng/mL (ref 0.00–0.08)

## 2016-12-12 LAB — BASIC METABOLIC PANEL
Anion gap: 11 (ref 5–15)
BUN: 8 mg/dL (ref 6–20)
CHLORIDE: 103 mmol/L (ref 101–111)
CO2: 24 mmol/L (ref 22–32)
Calcium: 9.3 mg/dL (ref 8.9–10.3)
Creatinine, Ser: 1.11 mg/dL (ref 0.61–1.24)
GFR calc Af Amer: >60 mL/min (ref >60)
GFR calc non Af Amer: 59 mL/min — ABNORMAL LOW (ref >60)
Glucose, Bld: 180 mg/dL — ABNORMAL HIGH (ref 65–99)
POTASSIUM: 4.5 mmol/L (ref 3.5–5.1)
SODIUM: 138 mmol/L (ref 135–145)

## 2016-12-12 LAB — CBC WITH DIFFERENTIAL/PLATELET
BASOS ABS: 0 10*3/uL (ref 0.0–0.1)
Basophils Absolute: 0 10*3/uL (ref 0.0–0.1)
Basophils Relative: 0 %
Basophils Relative: 0 %
EOS PCT: 3 %
Eosinophils Absolute: 0.3 10*3/uL (ref 0.0–0.7)
Eosinophils Absolute: 0 10*3/uL (ref 0.0–0.7)
Eosinophils Relative: 0 %
HCT: 41.2 % (ref 39.0–52.0)
HEMATOCRIT: 41.8 % (ref 39.0–52.0)
HEMOGLOBIN: 13.7 g/dL (ref 13.0–17.0)
Hemoglobin: 13.7 g/dL (ref 13.0–17.0)
LYMPHS ABS: 1.7 10*3/uL (ref 0.7–4.0)
LYMPHS PCT: 7 %
Lymphocytes Relative: 16 %
Lymphs Abs: 0.6 10*3/uL — ABNORMAL LOW (ref 0.7–4.0)
MCH: 28.8 pg (ref 26.0–34.0)
MCH: 28.2 pg (ref 26.0–34.0)
MCHC: 33.3 g/dL (ref 30.0–36.0)
MCHC: 32.8 g/dL (ref 30.0–36.0)
MCV: 86.7 fL (ref 78.0–100.0)
MCV: 86.2 fL (ref 78.0–100.0)
MONOS PCT: 3 %
Monocytes Absolute: 0.3 10*3/uL (ref 0.1–1.0)
Monocytes Absolute: 0 10*3/uL — ABNORMAL LOW (ref 0.1–1.0)
Monocytes Relative: 0 %
NEUTROS ABS: 8.6 10*3/uL — AB (ref 1.7–7.7)
NEUTROS PCT: 93 %
Neutro Abs: 8.1 10*3/uL — ABNORMAL HIGH (ref 1.7–7.7)
Neutrophils Relative %: 78 %
PLATELETS: 202 10*3/uL (ref 150–400)
Platelets: 202 10*3/uL (ref 150–400)
RBC: 4.75 MIL/uL (ref 4.22–5.81)
RBC: 4.85 MIL/uL (ref 4.22–5.81)
RDW: 13.5 % (ref 11.5–15.5)
RDW: 13.6 % (ref 11.5–15.5)
WBC: 10.4 10*3/uL (ref 4.0–10.5)
WBC: 9.2 10*3/uL (ref 4.0–10.5)

## 2016-12-12 LAB — GLUCOSE, CAPILLARY
Glucose-Capillary: 254 mg/dL — ABNORMAL HIGH (ref 65–99)
Glucose-Capillary: 219 mg/dL — ABNORMAL HIGH (ref 65–99)

## 2016-12-12 LAB — TROPONIN I
TROPONIN I: 0.71 ng/mL — AB (ref <0.03)
Troponin I: 0.53 ng/mL (ref <0.03)

## 2016-12-12 LAB — PROTIME-INR
INR: 1.12
Prothrombin Time: 14.4 seconds (ref 11.4–15.2)

## 2016-12-12 LAB — HEPARIN LEVEL (UNFRACTIONATED): Heparin Unfractionated: 0.52 IU/mL (ref 0.30–0.70)

## 2016-12-12 LAB — TSH: TSH: 0.502 u[IU]/mL (ref 0.350–4.500)

## 2016-12-12 LAB — MAGNESIUM: Magnesium: 1.6 mg/dL — ABNORMAL LOW (ref 1.7–2.4)

## 2016-12-12 MED ORDER — LEVALBUTEROL HCL 1.25 MG/0.5ML IN NEBU
1.2500 mg | INHALATION_SOLUTION | RESPIRATORY_TRACT | Status: DC | PRN
Start: 2016-12-12 — End: 2016-12-16
  Filled 2016-12-12: qty 0.5

## 2016-12-12 MED ORDER — IPRATROPIUM-ALBUTEROL 0.5-2.5 (3) MG/3ML IN SOLN
3.0000 mL | RESPIRATORY_TRACT | Status: AC
  Administered 2016-12-12 (×3): 3 mL via RESPIRATORY_TRACT
  Filled 2016-12-12: qty 9

## 2016-12-12 MED ORDER — ASPIRIN 300 MG RE SUPP: 300.0000 mg | RECTAL | Status: AC

## 2016-12-12 MED ORDER — INSULIN LISPRO PROT & LISPRO (75-25 MIX) 100 UNIT/ML KWIKPEN: 25.0000 [IU] | PEN_INJECTOR | Freq: Two times a day (BID) | SUBCUTANEOUS | Status: DC

## 2016-12-12 MED ORDER — SODIUM CHLORIDE 0.9 % IV SOLN
INTRAVENOUS | Status: DC
  Administered 2016-12-12: 18:00:00 via INTRAVENOUS

## 2016-12-12 MED ORDER — INSULIN ASPART PROT & ASPART (70-30 MIX) 100 UNIT/ML ~~LOC~~ SUSP
25.0000 [IU] | Freq: Two times a day (BID) | SUBCUTANEOUS | Status: DC
  Administered 2016-12-12 – 2016-12-16 (×7): 25 [IU] via SUBCUTANEOUS
  Filled 2016-12-12 (×2): qty 10

## 2016-12-12 MED ORDER — PANTOPRAZOLE SODIUM 40 MG PO TBEC
40.0000 mg | DELAYED_RELEASE_TABLET | Freq: Every day | ORAL | Status: DC
  Administered 2016-12-12 – 2016-12-16 (×4): 40 mg via ORAL
  Filled 2016-12-12 (×4): qty 1

## 2016-12-12 MED ORDER — HEPARIN BOLUS VIA INFUSION
4000.0000 [IU] | Freq: Once | INTRAVENOUS | Status: AC
  Administered 2016-12-12: 4000 [IU] via INTRAVENOUS
  Filled 2016-12-12: qty 4000

## 2016-12-12 MED ORDER — METHYLPREDNISOLONE SODIUM SUCC 125 MG IJ SOLR: 48.0000 mg | Freq: Once | INTRAMUSCULAR | Status: DC

## 2016-12-12 MED ORDER — HEPARIN (PORCINE) IN NACL 100-0.45 UNIT/ML-% IJ SOLN
1000.0000 [IU]/h | INTRAMUSCULAR | Status: DC
  Administered 2016-12-12 – 2016-12-13 (×2): 1100 [IU]/h via INTRAVENOUS
  Filled 2016-12-12 (×3): qty 250

## 2016-12-12 MED ORDER — NITROGLYCERIN 0.4 MG SL SUBL: 0.4000 mg | SUBLINGUAL_TABLET | SUBLINGUAL | Status: DC | PRN

## 2016-12-12 MED ORDER — VITAMIN D 1000 UNITS PO TABS
2000.0000 [IU] | ORAL_TABLET | Freq: Every day | ORAL | Status: DC
  Administered 2016-12-12 – 2016-12-16 (×4): 2000 [IU] via ORAL
  Filled 2016-12-12 (×4): qty 2

## 2016-12-12 MED ORDER — ASPIRIN EC 81 MG PO TBEC
81.0000 mg | DELAYED_RELEASE_TABLET | Freq: Every day | ORAL | Status: DC
  Administered 2016-12-13 – 2016-12-16 (×4): 81 mg via ORAL
  Filled 2016-12-12 (×4): qty 1

## 2016-12-12 MED ORDER — LEVETIRACETAM ER 500 MG PO TB24
1000.0000 mg | ORAL_TABLET | Freq: Every day | ORAL | Status: DC
Start: 2016-12-12 — End: 2016-12-16
  Administered 2016-12-12 – 2016-12-16 (×4): 1000 mg via ORAL
  Filled 2016-12-12 (×6): qty 2

## 2016-12-12 MED ORDER — OMEGA-3-ACID ETHYL ESTERS 1 G PO CAPS
2.0000 g | ORAL_CAPSULE | Freq: Every day | ORAL | Status: DC
  Administered 2016-12-12 – 2016-12-16 (×4): 2 g via ORAL
  Filled 2016-12-12 (×4): qty 2

## 2016-12-12 MED ORDER — LEVALBUTEROL HCL 1.25 MG/3ML IN NEBU: 1.2500 mg | INHALATION_SOLUTION | RESPIRATORY_TRACT | Status: DC | PRN

## 2016-12-12 MED ORDER — LEVALBUTEROL HCL 1.25 MG/0.5ML IN NEBU
1.2500 mg | INHALATION_SOLUTION | Freq: Four times a day (QID) | RESPIRATORY_TRACT | Status: DC
  Administered 2016-12-12 – 2016-12-15 (×13): 1.25 mg via RESPIRATORY_TRACT
  Filled 2016-12-12 (×12): qty 0.5

## 2016-12-12 MED ORDER — METOPROLOL TARTRATE 25 MG PO TABS
25.0000 mg | ORAL_TABLET | Freq: Two times a day (BID) | ORAL | Status: DC
  Administered 2016-12-12 – 2016-12-16 (×7): 25 mg via ORAL
  Filled 2016-12-12 (×7): qty 1

## 2016-12-12 MED ORDER — SERTRALINE HCL 50 MG PO TABS
50.0000 mg | ORAL_TABLET | Freq: Every day | ORAL | Status: DC
  Administered 2016-12-12 – 2016-12-16 (×4): 50 mg via ORAL
  Filled 2016-12-12 (×4): qty 1

## 2016-12-12 MED ORDER — ASPIRIN 81 MG PO CHEW
324.0000 mg | CHEWABLE_TABLET | ORAL | Status: AC
  Administered 2016-12-12: 324 mg via ORAL
  Filled 2016-12-12: qty 4

## 2016-12-12 MED ORDER — VITAMIN D 50 MCG (2000 UT) PO CAPS: 1.0000 | ORAL_CAPSULE | Freq: Every day | ORAL | Status: DC

## 2016-12-12 MED ORDER — OMEGA 3 1200 MG PO CAPS: 2400.0000 mg | ORAL_CAPSULE | Freq: Every day | ORAL | Status: DC

## 2016-12-12 MED ORDER — METHYLPREDNISOLONE SODIUM SUCC 125 MG IJ SOLR
60.0000 mg | Freq: Four times a day (QID) | INTRAMUSCULAR | Status: DC
  Administered 2016-12-12 – 2016-12-16 (×13): 60 mg via INTRAVENOUS
  Filled 2016-12-12 (×14): qty 2

## 2016-12-12 MED ORDER — NITROGLYCERIN IN D5W 200-5 MCG/ML-% IV SOLN
5.0000 ug/min | INTRAVENOUS | Status: DC
  Administered 2016-12-12: 5 ug/min via INTRAVENOUS
  Filled 2016-12-12 (×2): qty 250

## 2016-12-12 MED ORDER — AMLODIPINE BESYLATE 5 MG PO TABS
5.0000 mg | ORAL_TABLET | Freq: Every day | ORAL | Status: DC
  Administered 2016-12-12 – 2016-12-13 (×2): 5 mg via ORAL
  Filled 2016-12-12 (×2): qty 1

## 2016-12-12 MED ORDER — ROSUVASTATIN CALCIUM 10 MG PO TABS
20.0000 mg | ORAL_TABLET | Freq: Every day | ORAL | Status: DC
  Administered 2016-12-12 – 2016-12-16 (×4): 20 mg via ORAL
  Filled 2016-12-12 (×4): qty 2

## 2016-12-12 MED ORDER — ACETAMINOPHEN 325 MG PO TABS
650.0000 mg | ORAL_TABLET | ORAL | Status: DC | PRN
  Administered 2016-12-12: 650 mg via ORAL
  Filled 2016-12-12: qty 2

## 2016-12-12 MED ORDER — ONDANSETRON HCL 4 MG/2ML IJ SOLN: 4.0000 mg | Freq: Four times a day (QID) | INTRAMUSCULAR | Status: DC | PRN

## 2016-12-12 NOTE — Progress Notes (Signed)
ANTICOAGULATION CONSULT NOTE - Initial Consult  Pharmacy Consult for heparin Indication: chest pain/ACS  Allergies  Allergen Reactions  . Flexeril [Cyclobenzaprine] Other (See Comments)    delirium  . Nsaids Other (See Comments)    Renal failure    Patient Measurements: Height: 5\' 6"  (167.6 cm) Weight: 217 lb (98.4 kg) IBW/kg (Calculated) : 63.8 Heparin Dosing Weight: 85.4 kg   Vital Signs: Temp: 97.6 F (36.4 C) (01/06 0855) Temp Source: Oral (01/06 0855) BP: 141/71 (01/06 1100) Pulse Rate: 89 (01/06 1100)  Labs:  Recent Labs  12/12/16 1001  HGB 13.7  HCT 41.2  PLT 202  CREATININE 1.11    Estimated Creatinine Clearance: 55.3 mL/min (by C-G formula based on SCr of 1.11 mg/dL).   Medical History: Past Medical History:  Diagnosis Date  . Anxiety state, unspecified   . Blind   . Chronic airway obstruction, not elsewhere classified   . Coronary atherosclerosis of native coronary artery   . Cough   . Depressive disorder, not elsewhere classified   . DM (diabetes mellitus) type II controlled peripheral vascular disorder (HCC)   . Essential hypertension, benign   . H/O hiatal hernia   . Headache(784.0)   . Legal blindness, as defined in 02/09/17   . Memory loss   . Other and unspecified hyperlipidemia   . Pain in joint, site unspecified   . Palpitations   . Rash and other nonspecific skin eruption   . Reflux esophagitis   . Sebaceous cyst   . Spinal stenosis, lumbar region, with neurogenic claudication   . Type II diabetes mellitus with neurological manifestations, uncontrolled (HCC)   . Unspecified constipation   . Vitamin D deficiency       Assessment: 83 yo male who reports from home with chest wall pain and SOB. 91 Pharmacy has been consulted to dose heparin. Baseline CBC is stable and SCr 1.11. No anticoagulation prior to admission.   Goal of Therapy:  Heparin level 0.3-0.7 units/ml Monitor platelets by anticoagulation protocol: Yes   Plan:   Heparin 4000 unit bolus  Heparin 1100 units/hr  8 hour HL Daily HL/CBC  Marland Kitchen D PGY1 Pharmacy Resident Pager: [phone]  12/12/2016,11:25 AM

## 2016-12-12 NOTE — ED Notes (Signed)
Placed pt on 3L [place], MD made aware for O2 sat in low 90s. Will continue to monitor.

## 2016-12-12 NOTE — H&P (Signed)
Eddie Graves is an 83 y.o. male.   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 Patient denies any fever or chills but states has been coughing for last few days. States he continues to smoke a few cigarettes daily and and process of quitting. Patient had nuclear stress test approximately 5 months ago which showed moderate area of ischemia in mid and basal inferior wall with EF of 50%. Patient opted for medical management. Patient was noted to have minimally elevated troponin I EKG done in the ED showed normal sinus rhythm with nonspecific. Changes  Past Medical History:  Diagnosis Date  . Anxiety state, unspecified   . Blind   . Chronic airway obstruction, not elsewhere classified   . Coronary atherosclerosis of native coronary artery   . Cough   . Depressive disorder, not elsewhere classified   . DM (diabetes mellitus) type II controlled peripheral vascular disorder ([place])   . Essential hypertension, benign   . H/O hiatal hernia   . Headache(784.0)   . Legal blindness, as defined in [place]   . Memory loss   . Other and unspecified hyperlipidemia   . Pain in joint, site unspecified   . Palpitations   . Rash and other nonspecific skin eruption   .  Reflux esophagitis   . Sebaceous cyst   . Spinal stenosis, lumbar region, with neurogenic claudication   . Type II diabetes mellitus with neurological manifestations, uncontrolled ([place])   . Unspecified constipation   . Vitamin D deficiency     Past Surgical History:  Procedure Laterality Date  . CORONARY ANGIOPLASTY WITH STENT PLACEMENT    . EYE SURGERY     right prosthetic globe  . HERNIA REPAIR      Family History  Problem Relation Age of Onset  . Asthma Mother   . Diabetes Son   . Cancer Daughter   . Diabetes Son    Social History:  reports that he has been smoking Cigarettes.  He has smoked for the past 50.00 years. He has never used smokeless tobacco. He reports that he does not drink alcohol or use drugs.  Allergies:  Allergies  Allergen Reactions  . Flexeril [Cyclobenzaprine] Other (See Comments)    delirium  . Nsaids Other (See Comments)    Renal failure     (Not in a hospital admission)  Results for orders placed or performed during the hospital encounter of 12/12/16 (from the past 48 hour(s))  Basic metabolic panel     Status: Abnormal   Collection Time: 12/12/16 10:01 AM  Result Value Ref Range   Sodium 138 135 - 145 mmol/L   Potassium 4.5 3.5 - 5.1 mmol/L   Chloride 103 101 - 111 mmol/L   CO2 24 22 - 32 mmol/L   Glucose, Bld 180 (H) 65 - 99  mg/dL   BUN 8 6 - 20 mg/dL   Creatinine, Ser 1.11 0.61 - 1.24 mg/dL   Calcium 9.3 8.9 - 10.3 mg/dL   GFR calc non Af Amer 59 (L) >60 mL/min   GFR calc Af Amer >60 >60 mL/min    Comment: (NOTE) The eGFR has been calculated using the CKD EPI equation. This calculation has not been validated in all clinical situations. eGFR's persistently <60 mL/min signify possible Chronic Kidney Disease.    Anion gap 11 5 - 15  CBC with Differential     Status: Abnormal   Collection Time: 12/12/16 10:01 AM  Result Value Ref Range   WBC 10.4 4.0 - 10.5 K/uL   RBC 4.75 4.22 - 5.81 MIL/uL   Hemoglobin 13.7 13.0 - 17.0 g/dL    HCT 41.2 39.0 - 52.0 %   MCV 86.7 78.0 - 100.0 fL   MCH 28.8 26.0 - 34.0 pg   MCHC 33.3 30.0 - 36.0 g/dL   RDW 13.5 11.5 - 15.5 %   Platelets 202 150 - 400 K/uL   Neutrophils Relative % 78 %   Neutro Abs 8.1 (H) 1.7 - 7.7 K/uL   Lymphocytes Relative 16 %   Lymphs Abs 1.7 0.7 - 4.0 K/uL   Monocytes Relative 3 %   Monocytes Absolute 0.3 0.1 - 1.0 K/uL   Eosinophils Relative 3 %   Eosinophils Absolute 0.3 0.0 - 0.7 K/uL   Basophils Relative 0 %   Basophils Absolute 0.0 0.0 - 0.1 K/uL  Brain natriuretic peptide     Status: None   Collection Time: 12/12/16 10:01 AM  Result Value Ref Range   B Natriuretic Peptide 46.7 0.0 - 100.0 pg/mL  I-stat troponin, ED     Status: Abnormal   Collection Time: 12/12/16 10:21 AM  Result Value Ref Range   Troponin i, poc 0.29 (HH) 0.00 - 0.08 ng/mL   Comment NOTIFIED PHYSICIAN    Comment 3            Comment: Due to the release kinetics of cTnI, a negative result within the first hours of the onset of symptoms does not rule out myocardial infarction with certainty. If myocardial infarction is still suspected, repeat the test at appropriate intervals.    Dg Chest 2 View  Result Date: 12/12/2016 CLINICAL DATA:  SOB, chest pain across his chest down to his fingers that started today. HX diabetes EXAM: CHEST  2 VIEW COMPARISON:  11/06/2015 FINDINGS: Cardiac silhouette is borderline enlarged. No mediastinal or hilar masses. No evidence of adenopathy. Lungs are mildly hyperexpanded. There are prominent bronchovascular markings and mild interstitial thickening most evident in the lower lungs, similar to the prior study. No evidence of pneumonia or pulmonary edema. No pleural effusion or pneumothorax. Skeletal structures are demineralized but grossly intact. IMPRESSION: No acute cardiopulmonary disease. Stable appearance from the prior study. Electronically Signed   By: Lajean Manes M.D.   On: 12/12/2016 09:51    Review of Systems  Constitutional: Positive  for malaise/fatigue. Negative for chills and fever.  Respiratory: Positive for cough, shortness of breath and wheezing.   Cardiovascular: Positive for chest pain.  Gastrointestinal: Positive for abdominal pain, nausea and vomiting.  Genitourinary: Negative for dysuria.  Neurological: Negative for dizziness.    Blood pressure 140/73, pulse 94, temperature 97.6 F (36.4 C), temperature source Oral, resp. rate 19, height '5\' 6"'$  (1.676 m), weight 217 lb (98.4 kg), SpO2 98 %. Physical Exam  Constitutional: He is oriented to  person, place, and time.  HENT:  Head: Normocephalic and atraumatic.  Eyes: Conjunctivae are normal. Left eye exhibits no discharge. No scleral icterus.  Neck: Normal range of motion. Neck supple. No JVD present. No tracheal deviation present. No thyromegaly present.  Cardiovascular: Normal rate and regular rhythm.   Murmur (Soft S4 gallop noted) heard. Respiratory:  Bilateral expiratory wheezing noted  GI: Soft. Bowel sounds are normal. He exhibits distension. There is no tenderness. There is no rebound.  Musculoskeletal: He exhibits no edema, tenderness or deformity.  Neurological: He is alert and oriented to person, place, and time.     Assessment/Plan Acute small non-Q-wave myocardial infarction Positive nuclear stress test and recent past Multivessel CAD status post PCI to LAD and RCA in the past Hypertension Diabetes mellitus Diabetic neuropathy Hyperlipidemia COPD Exacerbation of COPD Tobacco abuse Degenerative joint disease Chronic kidney disease stage II/3 Seizure disorder Plan As per orders Discussed with patient and family regarding left cardiac catheterization possible PTCA stenting once his pulmonary function improves its risk and benefits i.e. death MI stroke need for emergency CABG local vascular complications etc. and consents for PCI  Charolette Forward, MD 12/12/2016, 11:56 AM

## 2016-12-12 NOTE — Progress Notes (Signed)
ANTICOAGULATION CONSULT NOTE - Follow-Up  Pharmacy Consult for heparin Indication: chest pain/ACS  Patient Measurements: Height: 5\' 6"  (167.6 cm) Weight: 217 lb (98.4 kg) IBW/kg (Calculated) : 63.8 Heparin Dosing Weight: 85.4 kg   Vital Signs: Temp: 97.9 F (36.6 C) (01/06 1608) Temp Source: Oral (01/06 1608) BP: 173/94 (01/06 1608) Pulse Rate: 104 (01/06 1608)  Labs:  Recent Labs  12/12/16 1001 12/12/16 1532 12/12/16 1636 12/12/16 2013  HGB 13.7  --  13.7  --   HCT 41.2  --  41.8  --   PLT 202  --  202  --   LABPROT  --   --  14.4  --   INR  --   --  1.12  --   HEPARINUNFRC  --   --   --  0.52  CREATININE 1.11  --  1.36*  --   TROPONINI  --  0.53*  --   --     Estimated Creatinine Clearance: 45.2 mL/min (by C-G formula based on SCr of 1.36 mg/dL (H)).   Assessment: 83 yo male who reports from home with chest wall pain and SOB. 91 Pharmacy has been consulted to dose heparin.   Heparin level this evening is therapeutic (HL 0.52, goal of 0.3-0.7). CBC remains stable. No overt s/x of bleeding noted.   Goal of Therapy:  Heparin level 0.3-0.7 units/ml Monitor platelets by anticoagulation protocol: Yes   Plan:  1. Continue Heparin at 1100 units/hr (11 ml/hr) 2. Will continue to monitor for any signs/symptoms of bleeding and will follow up with heparin level in the a.m to confirm  Thank you for allowing pharmacy to be a part of this patient's care.  Marland Kitchen, PharmD, BCPS Clinical Pharmacist Pager: [phone] 12/12/2016 9:03 PM

## 2016-12-12 NOTE — ED Notes (Signed)
Pt currently denies CP, discomfort, pressure, or tightness. Will continue to monitor.

## 2016-12-12 NOTE — ED Provider Notes (Signed)
[facility]-EMERGENCY DEPT Provider Note   CSN: [id number] Arrival date & time: 12/12/16  0848     History   Chief Complaint Chief Complaint  Patient presents with  . Chest Pain  . Shortness of Breath    HPI Eddie Graves is a 83 y.o. male.  The history is provided by the patient.  Chest Pain   This is a new problem. The current episode started 3 to 5 hours ago. The problem occurs constantly. The problem has not changed since onset.The pain is associated with rest. The pain is present in the substernal region (left sided). The pain is moderate. The quality of the pain is described as pressure-like. The pain radiates to the left arm and left shoulder. Duration of episode(s) is 3 hours. Associated symptoms include cough (chronic), diaphoresis, nausea and shortness of breath (acute on chronic worsening this morning). Pertinent negatives include no near-syncope and no vomiting. Treatments tried: albuterol. The treatment provided mild relief. Risk factors include being elderly and smoking/tobacco exposure (4-5 cigarettes/day).  His past medical history is significant for CAD, COPD, CHF (diastolic), diabetes, hyperlipidemia and hypertension.  Shortness of Breath  Associated symptoms include cough (chronic) and chest pain. Pertinent negatives include no vomiting. Associated medical issues include CAD.    Past Medical History:  Diagnosis Date  . Anxiety state, unspecified   . Blind   . Chronic airway obstruction, not elsewhere classified   . Coronary atherosclerosis of native coronary artery   . Cough   . Depressive disorder, not elsewhere classified   . DM (diabetes mellitus) type II controlled peripheral vascular disorder (HCC)   . Essential hypertension, benign   . H/O hiatal hernia   . Headache(784.0)   . Legal blindness, as defined in [place]   . Memory loss   . Other and unspecified hyperlipidemia   . Pain in joint, site unspecified   . Palpitations   . Rash and other nonspecific skin  eruption   . Reflux esophagitis   . Sebaceous cyst   . Spinal stenosis, lumbar region, with neurogenic claudication   . Type II diabetes mellitus with neurological manifestations, uncontrolled (HCC)   . Unspecified constipation   . Vitamin D deficiency     Patient Active Problem List   Diagnosis Date Noted  . Uncontrolled type 2 diabetes mellitus with diabetic polyneuropathy, with long-term current use of insulin (HCC) 03/05/2016  . Tobacco abuse counseling 03/05/2016  . Right sided abdominal pain 03/05/2016  . Constipation 03/05/2016  . Chest pain at rest 11/06/2015  . Diastolic heart failure (HCC) 11/06/2015  . Acute bronchitis with chronic obstructive pulmonary disease (COPD) (HCC) 11/06/2015  . Neurological deficit, transient 11/06/2015  . Left facial numbness   . AKI (acute kidney injury) (HCC)   . Chest pain 05/19/2015  . History of gonorrhea 05/19/2015  . Hypotension 05/19/2015  . CKD (chronic kidney disease), stage III 05/19/2015  . Involuntary muscle contractions 10/12/2014  . Seizure disorder after [facility] 08/19/2014  . Diabetic peripheral neuropathy associated with type 2 diabetes mellitus (HCC) 07/26/2014  . Blind 07/26/2014  . Tobacco abuse 04/26/2014  . Obesity (BMI 30-39.9) 08/18/2013  . COPD exacerbation (HCC) 08/06/2013  . SAH (subarachnoid hemorrhage) (HCC) 05/30/2013  . Coronary atherosclerosis of native coronary artery   . Vitamin D deficiency   . Memory loss   . COPD (chronic obstructive pulmonary disease) (HCC) 03/16/2013  . Essential hypertension, benign 03/16/2013  . Depressive disorder, not elsewhere classified 03/16/2013  . Spinal stenosis, lumbar region, with  neurogenic claudication 03/16/2013    Past Surgical History:  Procedure Laterality Date  . CORONARY ANGIOPLASTY WITH STENT PLACEMENT    . EYE SURGERY     right prosthetic globe  . HERNIA REPAIR         Home Medications    Prior to Admission medications   Medication Sig Start Date End  Date Taking? Authorizing Provider  amLODipine (NORVASC) 2.5 MG tablet Take one tablet by mouth once daily for blood pressure 06/06/15   Tiffany L Reed, DO  aspirin EC 81 MG tablet 1/2 tablet by mouth daily per Cardiologist 11/05/16   Kermit Balo, DO  Blood Pressure Monitoring (BLOOD PRESSURE CUFF) MISC Check blood sugar once daily as directed DX: Hypertension, patient with blindness and needs a monitor that speaks to him 10/23/16   Kermit Balo, DO  Cholecalciferol (VITAMIN D) 2000 units CAPS Take 1 capsule (2,000 Units total) by mouth daily. For vitamin D repletion 06/22/16   Tiffany L Reed, DO  CRESTOR 20 MG tablet Take 20 mg by mouth daily. For cholesterol 08/13/14   Historical Provider, MD  esomeprazole (NEXIUM) 40 MG capsule TAKE ONE CAPSULE BY MOUTH ONCE DAILY BEFORE BREAKFAST FOR STOMACH 11/02/16   Tiffany L Reed, DO  GARLIC PO Take 1,601 mcg by mouth daily.     Historical Provider, MD  glucose blood test strip Prodigy test strips, Check blood sugar twice daily DX: E11.65, H54.7 patient with blindness and needs strips with read aloud monitor 10/23/16   Tiffany L Reed, DO  Insulin Lispro Prot & Lispro ([name] 75/25 KWIKPEN) (75-25) 100 UNIT/ML Kwikpen Inject 40 Units into the skin 2 (two) times daily with a meal. 08/17/16   Edison Pace, RPH-CPP  levalbuterol (XOPENEX) 1.25 MG/3ML nebulizer solution Take 1.25 mg by nebulization every 4 (four) hours as needed for wheezing. 12/19/15   Tiffany L Reed, DO  levETIRAcetam (KEPPRA XR) 500 MG 24 hr tablet TAKE TWO   TABLETS BY MOUTH   DAILY 11/02/16   Tiffany L Reed, DO  linagliptin (TRADJENTA) 5 MG TABS tablet Take one tablet by mouth once daily to control blood sugar 04/24/16   Tiffany L Reed, DO  lisinopril (PRINIVIL,ZESTRIL) 10 MG tablet TAKE ONE TABLET   BY MOUTH   DAILY   FOR BLOOD PRESSURE 03/16/16   Tiffany L Reed, DO  meloxicam (MOBIC) 7.5 MG tablet TAKE 1 TABLET (7.5 MG TOTAL) BY MOUTH DAILY. 11/02/16   Tiffany L Reed, DO  metoprolol  succinate (TOPROL-XL) 100 MG 24 hr tablet Take one tablet by mouth once daily for blood pressure 06/06/15   Tiffany L Reed, DO  nitroGLYCERIN (NITRODUR - DOSED IN MG/24 HR) 0.4 mg/hr Place 1 patch onto the skin daily. For chest pain    Historical Provider, MD  Omega 3 1200 MG CAPS Take 2,400 mg by mouth daily. For cholesterol    Historical Provider, MD  oxyCODONE (ROXICODONE) 5 MG immediate release tablet Take 1 tablet (5 mg total) by mouth 2 (two) times daily as needed for severe pain. 11/18/16   Monica Carter, DO  PROAIR HFA 108 (90 Base) MCG/ACT inhaler INHALE TWO   PUFFS INTO THE LUNGS EVERY   TWO   HOURS IF NEEDED 11/02/16   Tiffany L Reed, DO  sertraline (ZOLOFT) 50 MG tablet TAKE ONE TABLET   BY MOUTH   DAILY 07/20/16   Tiffany L Reed, DO  TRADJENTA 5 MG TABS tablet TAKE ONE TABLET   BY MOUTH   DAILY  FOR DIABETES 10/02/16   Kermit Balo, DO    Family History Family History  Problem Relation Age of Onset  . Asthma Mother   . Diabetes Son   . Cancer Daughter   . Diabetes Son     Social History Social History  Substance Use Topics  . Smoking status: Light Tobacco Smoker    Years: 50.00    Types: Cigarettes    Last attempt to quit: 03/07/2013  . Smokeless tobacco: Never Used     Comment: Patient say he smokes 4 cigs. per day  . Alcohol use No     Allergies   Flexeril [cyclobenzaprine] and Nsaids   Review of Systems Review of Systems  Constitutional: Positive for diaphoresis.  Respiratory: Positive for cough (chronic) and shortness of breath (acute on chronic worsening this morning).   Cardiovascular: Positive for chest pain. Negative for near-syncope.  Gastrointestinal: Positive for nausea. Negative for vomiting.     Physical Exam Updated Vital Signs BP 164/82 (BP Location: Right Arm)   Pulse 85   Temp 97.6 F (36.4 C) (Oral)   Resp 17   Ht 5\' 6"  (1.676 m)   Wt 217 lb (98.4 kg)   SpO2 99%   BMI 35.02 kg/m   Physical Exam  Constitutional: He is oriented to  person, place, and time. He appears well-developed and well-nourished. No distress.  HENT:  Head: Normocephalic and atraumatic.  Nose: Nose normal.  Eyes: Conjunctivae are normal.  Neck: Neck supple. No tracheal deviation present.  Cardiovascular: Normal rate, regular rhythm and normal heart sounds.   Pulmonary/Chest: Effort normal. No respiratory distress. He has wheezes (diffuse inspiratory and expiratory).  Abdominal: Soft. He exhibits no distension.  Neurological: He is alert and oriented to person, place, and time.  Skin: Skin is warm and dry.  Psychiatric: He has a normal mood and affect.     ED Treatments / Results  Labs (all labs ordered are listed, but only abnormal results are displayed) Labs Reviewed  BASIC METABOLIC PANEL - Abnormal; Notable for the following:       Result Value   Glucose, Bld 180 (*)    GFR calc non Af Amer 59 (*)    All other components within normal limits  CBC WITH DIFFERENTIAL/PLATELET - Abnormal; Notable for the following:    Neutro Abs 8.1 (*)    All other components within normal limits  TROPONIN I - Abnormal; Notable for the following:    Troponin I 0.53 (*)    All other components within normal limits  COMPREHENSIVE METABOLIC PANEL - Abnormal; Notable for the following:    CO2 21 (*)    Glucose, Bld 250 (*)    Creatinine, Ser 1.36 (*)    AST 45 (*)    GFR calc non Af Amer 46 (*)    GFR calc Af Amer 54 (*)    All other components within normal limits  MAGNESIUM - Abnormal; Notable for the following:    Magnesium 1.6 (*)    All other components within normal limits  CBC WITH DIFFERENTIAL/PLATELET - Abnormal; Notable for the following:    Neutro Abs 8.6 (*)    Lymphs Abs 0.6 (*)    Monocytes Absolute 0.0 (*)    All other components within normal limits  GLUCOSE, CAPILLARY - Abnormal; Notable for the following:    Glucose-Capillary 254 (*)    All other components within normal limits  I-STAT TROPOININ, ED - Abnormal; Notable for the  following:  Troponin i, poc 0.29 (*)    All other components within normal limits  BRAIN NATRIURETIC PEPTIDE  TSH  PROTIME-INR  HEPARIN LEVEL (UNFRACTIONATED)  TROPONIN I  TROPONIN I  HEMOGLOBIN A1C  HEPARIN LEVEL (UNFRACTIONATED)  CBC  BASIC METABOLIC PANEL  LIPID PANEL    EKG  EKG Interpretation  Date/Time:  Saturday December 12 2016 08:51:54 EST Ventricular Rate:  82 PR Interval:    QRS Duration: 94 QT Interval:  420 QTC Calculation: 491 R Axis:   -51 Text Interpretation:  Sinus rhythm Atrial premature complexes Borderline prolonged PR interval LAD, consider left anterior fascicular block Borderline prolonged QT interval No significant change since last tracing Confirmed by Litsy Epting MD, Reuel Boom ([id number]) on 12/12/2016 9:17:42 AM       Radiology Dg Chest 2 View  Result Date: 12/12/2016 CLINICAL DATA:  SOB, chest pain across his chest down to his fingers that started today. HX diabetes EXAM: CHEST  2 VIEW COMPARISON:  11/06/2015 FINDINGS: Cardiac silhouette is borderline enlarged. No mediastinal or hilar masses. No evidence of adenopathy. Lungs are mildly hyperexpanded. There are prominent bronchovascular markings and mild interstitial thickening most evident in the lower lungs, similar to the prior study. No evidence of pneumonia or pulmonary edema. No pleural effusion or pneumothorax. Skeletal structures are demineralized but grossly intact. IMPRESSION: No acute cardiopulmonary disease. Stable appearance from the prior study. Electronically Signed   By: Amie Portland M.D.   On: 12/12/2016 09:51    Procedures Procedures (including critical care time)  CRITICAL CARE Performed by: Lyndal Pulley Total critical care time: 30 minutes Critical care time was exclusive of separately billable procedures and treating other patients. Critical care was necessary to treat or prevent imminent or life-threatening deterioration. Critical care was time spent personally by me on the following  activities: development of treatment plan with patient and/or surrogate as well as nursing, discussions with consultants, evaluation of patient's response to treatment, examination of patient, obtaining history from patient or surrogate, ordering and performing treatments and interventions, ordering and review of laboratory studies, ordering and review of radiographic studies, pulse oximetry and re-evaluation of patient's condition.  Medications Ordered in ED Medications  nitroGLYCERIN (NITROSTAT) SL tablet 0.4 mg (not administered)  heparin ADULT infusion 100 units/mL (25000 units/275mL sodium chloride 0.45%) (1,100 Units/hr Intravenous New Bag/Given 12/12/16 1229)  pantoprazole (PROTONIX) EC tablet 40 mg (40 mg Oral Given 12/12/16 1739)  levETIRAcetam (KEPPRA XR) 24 hr tablet 1,000 mg (1,000 mg Oral Given 12/12/16 1833)  sertraline (ZOLOFT) tablet 50 mg (50 mg Oral Given 12/12/16 1739)  amLODipine (NORVASC) tablet 5 mg (5 mg Oral Given 12/12/16 1739)  rosuvastatin (CRESTOR) tablet 20 mg (20 mg Oral Given 12/12/16 1739)  aspirin EC tablet 81 mg (not administered)  acetaminophen (TYLENOL) tablet 650 mg (650 mg Oral Given 12/12/16 1739)  ondansetron (ZOFRAN) injection 4 mg (not administered)  0.9 %  sodium chloride infusion ( Intravenous New Bag/Given 12/12/16 1811)  nitroGLYCERIN 50 mg in dextrose 5 % 250 mL (0.2 mg/mL) infusion (5 mcg/min Intravenous New Bag/Given 12/12/16 1845)  metoprolol tartrate (LOPRESSOR) tablet 25 mg (not administered)  levalbuterol (XOPENEX) nebulizer solution 1.25 mg (1.25 mg Nebulization Given 12/12/16 1931)  methylPREDNISolone sodium succinate (SOLU-MEDROL) 125 mg/2 mL injection 60 mg (60 mg Intravenous Given 12/12/16 1738)  insulin aspart protamine- aspart (NOVOLOG MIX 70/30) injection 25 Units (25 Units Subcutaneous Given 12/12/16 1946)  cholecalciferol (VITAMIN D) tablet 2,000 Units (2,000 Units Oral Given 12/12/16 1738)  levalbuterol (XOPENEX) nebulizer solution 1.25 mg (not  administered)   omega-3 acid ethyl esters (LOVAZA) capsule 2 g (2 g Oral Given 12/12/16 1738)  ipratropium-albuterol (DUONEB) 0.5-2.5 (3) MG/3ML nebulizer solution 3 mL (3 mLs Nebulization Given 12/12/16 1129)  heparin bolus via infusion 4,000 Units (4,000 Units Intravenous Bolus from Bag 12/12/16 1233)  aspirin chewable tablet 324 mg (324 mg Oral Given 12/12/16 1739)    Or  aspirin suppository 300 mg ( Rectal See Alternative 12/12/16 1739)     Initial Impression / Assessment and Plan / ED Course  I have reviewed the triage vital signs and the nursing notes.  Pertinent labs & imaging results that were available during my care of the patient were reviewed by me and considered in my medical decision making (see chart for details).  Clinical Course     83 y.o. male presents with shortness of breath this morning accompanied by left sided chest pressure radiating to left arm. H/o CAD. Suspect COPD exacerbation with concomitant ACS given presentation. Discussed with Dr Sharyn Lull who will admit Pt. ASA 324 taken prior to arrival, heparinized in ED.   Final Clinical Impressions(s) / ED Diagnoses   Final diagnoses:  COPD exacerbation (HCC)  Unstable angina (HCC)  Troponin level elevated    New Prescriptions New Prescriptions   No medications on file     Lyndal Pulley, MD 12/12/16 2047

## 2016-12-12 NOTE — ED Triage Notes (Signed)
Pt in from home via [facility] with chest wall pain and sob since 0730 this morning. Pt states he had been feeling increasingly weak and days prior. Pt took 324 ASA and home Albuterol inhaler. pt has inspir/expir wheezes, EMS gave 10 of albuterol, 1 Atrovent and 125mg  Solumedrol. Sats 99% on 8L, on ED arrival.

## 2016-12-13 LAB — HEMOGLOBIN A1C
Hgb A1c MFr Bld: 7.2 % — ABNORMAL HIGH (ref 4.8–5.6)
MEAN PLASMA GLUCOSE: 160 mg/dL

## 2016-12-13 LAB — CBC
HCT: 40.8 % (ref 39.0–52.0)
Hemoglobin: 13.4 g/dL (ref 13.0–17.0)
MCH: 27.9 pg (ref 26.0–34.0)
MCHC: 32.8 g/dL (ref 30.0–36.0)
MCV: 84.8 fL (ref 78.0–100.0)
PLATELETS: 229 10*3/uL (ref 150–400)
RBC: 4.81 MIL/uL (ref 4.22–5.81)
RDW: 13.7 % (ref 11.5–15.5)
WBC: 12 10*3/uL — AB (ref 4.0–10.5)

## 2016-12-13 LAB — LIPID PANEL
CHOL/HDL RATIO: 2.5 ratio
CHOLESTEROL: 140 mg/dL (ref 0–200)
HDL: 56 mg/dL (ref >40)
LDL Cholesterol: 76 mg/dL (ref 0–99)
Triglycerides: 42 mg/dL (ref <150)
VLDL: 8 mg/dL (ref 0–40)

## 2016-12-13 LAB — BASIC METABOLIC PANEL
Anion gap: 10 (ref 5–15)
BUN: 13 mg/dL (ref 6–20)
CALCIUM: 9.8 mg/dL (ref 8.9–10.3)
CO2: 27 mmol/L (ref 22–32)
Chloride: 101 mmol/L (ref 101–111)
Creatinine, Ser: 1.23 mg/dL (ref 0.61–1.24)
GFR calc Af Amer: >60 mL/min (ref >60)
GFR, EST NON AFRICAN AMERICAN: 52 mL/min — AB (ref >60)
GLUCOSE: 166 mg/dL — AB (ref 65–99)
POTASSIUM: 4.4 mmol/L (ref 3.5–5.1)
SODIUM: 138 mmol/L (ref 135–145)

## 2016-12-13 LAB — HEPARIN LEVEL (UNFRACTIONATED): HEPARIN UNFRACTIONATED: 0.39 [IU]/mL (ref 0.30–0.70)

## 2016-12-13 LAB — GLUCOSE, CAPILLARY
GLUCOSE-CAPILLARY: 173 mg/dL — AB (ref 65–99)
GLUCOSE-CAPILLARY: 168 mg/dL — AB (ref 65–99)
GLUCOSE-CAPILLARY: 258 mg/dL — AB (ref 65–99)
GLUCOSE-CAPILLARY: 202 mg/dL — AB (ref 65–99)

## 2016-12-13 LAB — TROPONIN I: TROPONIN I: 1.29 ng/mL — AB (ref <0.03)

## 2016-12-13 LAB — MRSA PCR SCREENING: MRSA BY PCR: NEGATIVE

## 2016-12-13 MED ORDER — CEFTRIAXONE SODIUM 1 G IJ SOLR
1.0000 g | INTRAMUSCULAR | Status: DC
  Administered 2016-12-13 – 2016-12-14 (×2): 1 g via INTRAVENOUS
  Filled 2016-12-13 (×4): qty 10

## 2016-12-13 MED ORDER — AMLODIPINE BESYLATE 10 MG PO TABS
10.0000 mg | ORAL_TABLET | Freq: Every day | ORAL | Status: DC
  Administered 2016-12-14 – 2016-12-16 (×2): 10 mg via ORAL
  Filled 2016-12-13 (×2): qty 1

## 2016-12-13 NOTE — Progress Notes (Signed)
ANTICOAGULATION CONSULT NOTE - Follow-Up  Pharmacy Consult for heparin Indication: chest pain/ACS  Patient Measurements: Height: 5\' 6"  (167.6 cm) Weight: 211 lb 14.4 oz (96.1 kg) IBW/kg (Calculated) : 63.8 Heparin Dosing Weight: 85.4 kg   Vital Signs: Temp: 98 F (36.7 C) (01/07 0418) Temp Source: Oral (01/07 0418) BP: 181/87 (01/07 0418) Pulse Rate: 95 (01/07 0418)  Labs:  Recent Labs  12/12/16 1001 12/12/16 1532 12/12/16 1636 12/12/16 2013 12/13/16 0228  HGB 13.7  --  13.7  --  13.4  HCT 41.2  --  41.8  --  40.8  PLT 202  --  202  --  229  LABPROT  --   --  14.4  --   --   INR  --   --  1.12  --   --   HEPARINUNFRC  --   --   --  0.52 0.39  CREATININE 1.11  --  1.36*  --  1.23  TROPONINI  --  0.53*  --  0.71* 1.29*    Estimated Creatinine Clearance: 49.4 mL/min (by C-G formula based on SCr of 1.23 mg/dL).   Assessment: 83 yo male who reports from home with chest wall pain and SOB.  Pharmacy has been consulted to dose heparin. Patient likely going for heart cath this admission for small non-Q-wave myocardial infarction.   Heparin level this morning remains therapeutic (HL 0.39, goal of 0.3-0.7). CBC remains stable. No overt s/x of bleeding noted.   Goal of Therapy:  Heparin level 0.3-0.7 units/ml Monitor platelets by anticoagulation protocol: Yes   Plan:  1. Continue Heparin at 1100 units/hr (11 ml/hr) 2. Daily heparin level/CBC 3. Monitor for signs/symptoms of bleeding  Thank you for allowing pharmacy to be a part of this patient's care.  91, PharmD PGY1 Pharmacy Resident  Pager: [phone] 12/13/2016 9:40 AM

## 2016-12-13 NOTE — Progress Notes (Signed)
Subjective:  Denies any further chest pain states breathing has improved continues to have coughing  Objective:  Vital Signs in the last 24 hours: Temp:  [97.9 F (36.6 C)-98.5 F (36.9 C)] 98 F (36.7 C) (01/07 0418) Pulse Rate:  [81-107] 96 (01/07 0955) Resp:  [16-25] 19 (01/07 0418) BP: (137-181)/(67-104) 157/102 (01/07 0955) SpO2:  [92 %-99 %] 96 % (01/07 0858) Weight:  [211 lb 14.4 oz (96.1 kg)] 211 lb 14.4 oz (96.1 kg) (01/07 0418)  Intake/Output from previous day: 01/06 0701 - 01/07 0700 In: 792 [P.O.:480; I.V.:312] Out: 2350 [Urine:2350] Intake/Output from this shift: No intake/output data recorded.  Physical Exam: Neck: no adenopathy, no carotid bruit, no JVD and supple, symmetrical, trachea midline Lungs: Decreased breath sound at bases with expiratory wheezing Heart: regular rate and rhythm, S1, S2 normal and Soft systolic murmur noted Abdomen: soft, non-tender; bowel sounds normal; no masses,  no organomegaly Extremities: extremities normal, atraumatic, no cyanosis or edema  Lab Results:  Recent Labs  12/12/16 1636 12/13/16 0228  WBC 9.2 12.0*  HGB 13.7 13.4  PLT 202 229    Recent Labs  12/12/16 1636 12/13/16 0228  NA 137 138  K 4.3 4.4  CL 103 101  CO2 21* 27  GLUCOSE 250* 166*  BUN 10 13  CREATININE 1.36* 1.23    Recent Labs  12/12/16 2013 12/13/16 0228  TROPONINI 0.71* 1.29*   Hepatic Function Panel  Recent Labs  12/12/16 1636  PROT 7.5  ALBUMIN 3.7  AST 45*  ALT 17  ALKPHOS 96  BILITOT 0.3    Recent Labs  12/13/16 0228  CHOL 140   No results for input(s): PROTIME in the last 72 hours.  Imaging: Imaging results have been reviewed and Dg Chest 2 View  Result Date: 12/12/2016 CLINICAL DATA:  SOB, chest pain across his chest down to his fingers that started today. HX diabetes EXAM: CHEST  2 VIEW COMPARISON:  11/06/2015 FINDINGS: Cardiac silhouette is borderline enlarged. No mediastinal or hilar masses. No evidence of  adenopathy. Lungs are mildly hyperexpanded. There are prominent bronchovascular markings and mild interstitial thickening most evident in the lower lungs, similar to the prior study. No evidence of pneumonia or pulmonary edema. No pleural effusion or pneumothorax. Skeletal structures are demineralized but grossly intact. IMPRESSION: No acute cardiopulmonary disease. Stable appearance from the prior study. Electronically Signed   By: Amie Portland M.D.   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  LOS: 1 day    Rinaldo Cloud 12/13/2016, 9:57 AM

## 2016-12-14 ENCOUNTER — Other Ambulatory Visit: Payer: Self-pay

## 2016-12-14 LAB — CBC
HCT: 41.9 % (ref 39.0–52.0)
Hemoglobin: 14.1 g/dL (ref 13.0–17.0)
MCH: 28.4 pg (ref 26.0–34.0)
MCHC: 33.7 g/dL (ref 30.0–36.0)
MCV: 84.5 fL (ref 78.0–100.0)
PLATELETS: 233 10*3/uL (ref 150–400)
RBC: 4.96 MIL/uL (ref 4.22–5.81)
RDW: 14 % (ref 11.5–15.5)
WBC: 17.8 10*3/uL — ABNORMAL HIGH (ref 4.0–10.5)

## 2016-12-14 LAB — BASIC METABOLIC PANEL
Anion gap: 9 (ref 5–15)
BUN: 28 mg/dL — AB (ref 6–20)
CALCIUM: 9.6 mg/dL (ref 8.9–10.3)
CO2: 27 mmol/L (ref 22–32)
Chloride: 102 mmol/L (ref 101–111)
Creatinine, Ser: 1.46 mg/dL — ABNORMAL HIGH (ref 0.61–1.24)
GFR calc Af Amer: 49 mL/min — ABNORMAL LOW (ref >60)
GFR calc non Af Amer: 43 mL/min — ABNORMAL LOW (ref >60)
GLUCOSE: 229 mg/dL — AB (ref 65–99)
Potassium: 4.4 mmol/L (ref 3.5–5.1)
Sodium: 138 mmol/L (ref 135–145)

## 2016-12-14 LAB — GLUCOSE, CAPILLARY
GLUCOSE-CAPILLARY: 196 mg/dL — AB (ref 65–99)
Glucose-Capillary: 216 mg/dL — ABNORMAL HIGH (ref 65–99)
Glucose-Capillary: 227 mg/dL — ABNORMAL HIGH (ref 65–99)

## 2016-12-14 LAB — TROPONIN I: Troponin I: 0.58 ng/mL (ref <0.03)

## 2016-12-14 LAB — HEPARIN LEVEL (UNFRACTIONATED): Heparin Unfractionated: 0.77 IU/mL — ABNORMAL HIGH (ref 0.30–0.70)

## 2016-12-14 MED ORDER — SODIUM CHLORIDE 0.9 % WEIGHT BASED INFUSION
1.0000 mL/kg/h | INTRAVENOUS | Status: DC
  Administered 2016-12-14: 1 mL/kg/h via INTRAVENOUS

## 2016-12-14 MED ORDER — MOMETASONE FURO-FORMOTEROL FUM 200-5 MCG/ACT IN AERO
2.0000 | INHALATION_SPRAY | Freq: Two times a day (BID) | RESPIRATORY_TRACT | Status: DC
  Administered 2016-12-14 – 2016-12-16 (×4): 2 via RESPIRATORY_TRACT
  Filled 2016-12-14: qty 8.8

## 2016-12-14 NOTE — Progress Notes (Signed)
Inpatient Diabetes Program Recommendations  AACE/ADA: New Consensus Statement on Inpatient Glycemic Control (2015)  Target Ranges:  Prepandial:   less than 140 mg/dL      Peak postprandial:   less than 180 mg/dL (1-2 hours)      Critically ill patients:  140 - 180 mg/dL   Lab Results  Component Value Date   GLUCAP 216 (H) 12/14/2016   HGBA1C 7.2 (H) 12/12/2016    Review of Glycemic Control  Diabetes history: DM, steroids this admission, BUN=28, Creatinine=1.46, elderly Outpatient Diabetes medications: [name] 75/25 40 units BID, Tradjenta 5 mg daily Current orders for Inpatient glycemic control: Novolog Mix 70/30 25 units BID  Inpatient Diabetes Program Recommendations:    Please consider continuing Tradjenta 5 mg daily while inpatient.    Please consider increasing Novolog Mix 70/30 to 30 units BID if CBG's remain elevated.  Thank you,  Kristine Linea, RN, MSN Diabetes Coordinator Inpatient Diabetes Program [phone] (Team Pager)

## 2016-12-14 NOTE — Care Management Note (Addendum)
Case Management Note  Patient Details  Name: Eddie Graves MRN: [id number] Date of Birth: 01-Jun-1931  Subjective/Objective: Pt presented for chest pain. Pt is Blind and lives with son. Son works from 1:00 am to 6:00 am. Pt uses a cane. Pt's sister at the bedside during meeting. Per pt he has had [facility] in the past. Pt will benefit from PT/OT consult before d/c.                     Action/Plan: CM will continue to monitor for additional needs.   Expected Discharge Date:                  Expected Discharge Plan:  Home w Home Health Services  In-House Referral:  NA  Discharge planning Services  CM Consult  Post Acute Care Choice:   Home Health Choice offered to:   Patient  DME Arranged:   NA DME Agency:   NA  HH Arranged:   RN HH Agency:   [facility]  Status of Service:  In process, will continue to follow  If discussed at Long Length of Stay Meetings, dates discussed:    Additional Comments: 1544 12-16-16 Tomi Bamberger, RN,BSN [phone] CM did speak with pt before d/c. Pt wanted [facility] for new medications and disease management. CM did offer agency list for choice and pt had used [facility] in the past and wants to use again. Referral was made to [facility] for [facility]. SOC to begin within 24-48 hour post d/c. No further needs from CM at this time.  Gala Lewandowsky, RN 12/14/2016, 2:28 PM

## 2016-12-14 NOTE — Progress Notes (Signed)
ANTICOAGULATION CONSULT NOTE - Follow-Up  Pharmacy Consult for heparin Indication: chest pain/ACS  Patient Measurements: Height: 5\' 6"  (167.6 cm) Weight: 208 lb 14.4 oz (94.8 kg) IBW/kg (Calculated) : 63.8 Heparin Dosing Weight: 85.4 kg   Vital Signs: Temp: 98.4 F (36.9 C) (01/08 0900) Temp Source: Axillary (01/08 0900) BP: 144/80 (01/08 0900) Pulse Rate: 88 (01/08 0900)  Labs:  Recent Labs  12/12/16 1636 12/12/16 2013 12/13/16 0228 12/14/16 0647  HGB 13.7  --  13.4 14.1  HCT 41.8  --  40.8 41.9  PLT 202  --  229 233  LABPROT 14.4  --   --   --   INR 1.12  --   --   --   HEPARINUNFRC  --  0.52 0.39 0.77*  CREATININE 1.36*  --  1.23 1.46*  TROPONINI  --  0.71* 1.29* 0.58*    Estimated Creatinine Clearance: 41.3 mL/min (by C-G formula based on SCr of 1.46 mg/dL (H)).   Assessment: 83 yo male who reports from home with chest wall pain and SOB.  Pharmacy has been consulted to dose heparin. Plans for heart cath this admission for small non-Q-wave myocardial infarction when pulmonary status improved and patient able to lie flat for procedure.   Heparin level this morning SUPRAtherapeutic. CBC remains stable. No bleeding noted.   Goal of Therapy:  Heparin level 0.3-0.7 units/ml Monitor platelets by anticoagulation protocol: Yes   Plan:  1. Reduce Heparin to 1000 units/hr  2. Daily heparin level/CBC 3. Monitor for signs/symptoms of bleeding  Thank you for allowing pharmacy to be a part of this patient's care.  91, Pharm.D., BCPS Clinical Pharmacist Pager [phone] 12/14/2016 10:52 AM

## 2016-12-14 NOTE — Progress Notes (Signed)
Subjective:  Denies any further chest pain. Coughing and breathing has improved. Troponin I are trending down.  Objective:  Vital Signs in the last 24 hours: Temp:  [97.4 F (36.3 C)-98.7 F (37.1 C)] 97.4 F (36.3 C) (01/08 1122) Pulse Rate:  [81-100] 81 (01/08 1122) Resp:  [14-20] 15 (01/08 1122) BP: (133-164)/(72-105) 133/72 (01/08 1122) SpO2:  [93 %-98 %] 96 % (01/08 1122) Weight:  [208 lb 14.4 oz (94.8 kg)] 208 lb 14.4 oz (94.8 kg) (01/08 0447)  Intake/Output from previous day: 01/07 0701 - 01/08 0700 In: 1115.4 [P.O.:480; I.V.:585.4; IV Piggyback:50] Out: 1300 [Urine:1300] Intake/Output from this shift: Total I/O In: 240 [P.O.:240] Out: 300 [Urine:300]  Physical Exam: Neck: no adenopathy, no carotid bruit, no JVD and supple, symmetrical, trachea midline Lungs: Decreased breath sound at bases with faint expiratory wheezing noted Heart: regular rate and rhythm, S1, S2 normal and Soft systolic murmur noted Abdomen: soft, non-tender; bowel sounds normal; no masses,  no organomegaly Extremities: extremities normal, atraumatic, no cyanosis or edema  Lab Results:  Recent Labs  12/13/16 0228 12/14/16 0647  WBC 12.0* 17.8*  HGB 13.4 14.1  PLT 229 233    Recent Labs  12/13/16 0228 12/14/16 0647  NA 138 138  K 4.4 4.4  CL 101 102  CO2 27 27  GLUCOSE 166* 229*  BUN 13 28*  CREATININE 1.23 1.46*    Recent Labs  12/13/16 0228 12/14/16 0647  TROPONINI 1.29* 0.58*   Hepatic Function Panel  Recent Labs  12/12/16 1636  PROT 7.5  ALBUMIN 3.7  AST 45*  ALT 17  ALKPHOS 96  BILITOT 0.3    Recent Labs  12/13/16 0228  CHOL 140   No results for input(s): PROTIME in the last 72 hours.  Imaging: Imaging results have been reviewed and No results found.  Cardiac Studies:  Assessment/Plan:  Acute small non-Q-wave myocardial infarction Positive nuclear stress test and recent past Multivessel CAD status post PCI to LAD and RCA in the  past Hypertension Diabetes mellitus Diabetic neuropathy Hyperlipidemia COPD Exacerbation of COPD Tobacco abuse Degenerative joint disease Chronic kidney disease stage II/3 Seizure disorder Plan as per orders Discussed with patient regarding left cath possible PTCA stenting its risk and benefits i.e. death MI stroke need for emergency CABG local vascular complications etc. and consents for PCI   LOS: 2 days    Rinaldo Cloud 12/14/2016, 11:51 AM

## 2016-12-15 ENCOUNTER — Encounter (HOSPITAL_COMMUNITY)
Admission: EM | Disposition: A | Discharge status: Home Health | Payer: Self-pay | Source: Home / Self Care | Attending: Cardiology

## 2016-12-15 ENCOUNTER — Encounter (HOSPITAL_COMMUNITY): Payer: Self-pay | Admitting: Cardiology

## 2016-12-15 HISTORY — PX: CARDIAC CATHETERIZATION: SHX172

## 2016-12-15 LAB — BASIC METABOLIC PANEL
ANION GAP: 8 (ref 5–15)
BUN: 31 mg/dL — AB (ref 6–20)
CO2: 27 mmol/L (ref 22–32)
Calcium: 9.2 mg/dL (ref 8.9–10.3)
Chloride: 100 mmol/L — ABNORMAL LOW (ref 101–111)
Creatinine, Ser: 1.44 mg/dL — ABNORMAL HIGH (ref 0.61–1.24)
GFR calc Af Amer: 50 mL/min — ABNORMAL LOW (ref >60)
GFR calc non Af Amer: 43 mL/min — ABNORMAL LOW (ref >60)
GLUCOSE: 227 mg/dL — AB (ref 65–99)
POTASSIUM: 4.4 mmol/L (ref 3.5–5.1)
Sodium: 135 mmol/L (ref 135–145)

## 2016-12-15 LAB — CBC
HCT: 40 % (ref 39.0–52.0)
HEMOGLOBIN: 13 g/dL (ref 13.0–17.0)
MCH: 27.5 pg (ref 26.0–34.0)
MCHC: 32.5 g/dL (ref 30.0–36.0)
MCV: 84.7 fL (ref 78.0–100.0)
Platelets: 200 10*3/uL (ref 150–400)
RBC: 4.72 MIL/uL (ref 4.22–5.81)
RDW: 14 % (ref 11.5–15.5)
WBC: 14.9 10*3/uL — ABNORMAL HIGH (ref 4.0–10.5)

## 2016-12-15 LAB — GLUCOSE, CAPILLARY
GLUCOSE-CAPILLARY: 205 mg/dL — AB (ref 65–99)
GLUCOSE-CAPILLARY: 253 mg/dL — AB (ref 65–99)

## 2016-12-15 LAB — POCT ACTIVATED CLOTTING TIME
ACTIVATED CLOTTING TIME: 109 s
Activated Clotting Time: 109 seconds

## 2016-12-15 LAB — HEPARIN LEVEL (UNFRACTIONATED): HEPARIN UNFRACTIONATED: 0.42 [IU]/mL (ref 0.30–0.70)

## 2016-12-15 SURGERY — LEFT HEART CATH AND CORONARY ANGIOGRAPHY

## 2016-12-15 MED ORDER — SODIUM CHLORIDE 0.9% FLUSH: 3.0000 mL | INTRAVENOUS | Status: DC | PRN

## 2016-12-15 MED ORDER — MIDAZOLAM HCL 2 MG/2ML IJ SOLN
INTRAMUSCULAR | Status: DC | PRN
  Administered 2016-12-15: 1 mg via INTRAVENOUS

## 2016-12-15 MED ORDER — IOPAMIDOL (ISOVUE-370) INJECTION 76%
INTRAVENOUS | Status: AC
  Filled 2016-12-15: qty 100

## 2016-12-15 MED ORDER — IOPAMIDOL (ISOVUE-370) INJECTION 76%
INTRAVENOUS | Status: DC | PRN
  Administered 2016-12-15: 45 mL via INTRA_ARTERIAL

## 2016-12-15 MED ORDER — LIDOCAINE HCL (PF) 1 % IJ SOLN
INTRAMUSCULAR | Status: DC | PRN
Start: 2016-12-15 — End: 2016-12-15
  Administered 2016-12-15: 18 mL

## 2016-12-15 MED ORDER — LIDOCAINE HCL (PF) 1 % IJ SOLN
INTRAMUSCULAR | Status: AC
  Filled 2016-12-15: qty 30

## 2016-12-15 MED ORDER — SODIUM CHLORIDE 0.9 % IV SOLN: 250.0000 mL | INTRAVENOUS | Status: DC | PRN

## 2016-12-15 MED ORDER — ASPIRIN 81 MG PO CHEW: 81.0000 mg | CHEWABLE_TABLET | ORAL | Status: AC

## 2016-12-15 MED ORDER — SODIUM CHLORIDE 0.9 % IV SOLN: INTRAVENOUS | Status: AC

## 2016-12-15 MED ORDER — SODIUM CHLORIDE 0.9% FLUSH
3.0000 mL | Freq: Two times a day (BID) | INTRAVENOUS | Status: DC
  Administered 2016-12-15 – 2016-12-16 (×2): 3 mL via INTRAVENOUS

## 2016-12-15 MED ORDER — HEPARIN (PORCINE) IN NACL 2-0.9 UNIT/ML-% IJ SOLN
INTRAMUSCULAR | Status: AC
  Filled 2016-12-15: qty 1500

## 2016-12-15 MED ORDER — MIDAZOLAM HCL 2 MG/2ML IJ SOLN
INTRAMUSCULAR | Status: AC
  Filled 2016-12-15: qty 2

## 2016-12-15 MED ORDER — SODIUM CHLORIDE 0.9% FLUSH: 3.0000 mL | Freq: Two times a day (BID) | INTRAVENOUS | Status: DC

## 2016-12-15 MED ORDER — HEPARIN (PORCINE) IN NACL 2-0.9 UNIT/ML-% IJ SOLN
INTRAMUSCULAR | Status: DC | PRN
  Administered 2016-12-15: 1500 mL

## 2016-12-15 MED ORDER — CLOPIDOGREL BISULFATE 75 MG PO TABS
75.0000 mg | ORAL_TABLET | Freq: Every day | ORAL | Status: DC
Start: 2016-12-15 — End: 2016-12-16
  Administered 2016-12-15 – 2016-12-16 (×2): 75 mg via ORAL
  Filled 2016-12-15 (×2): qty 1

## 2016-12-15 SURGICAL SUPPLY — 6 items
CATH INFINITI 5FR MULTPACK ANG (CATHETERS) ×2 IMPLANT
KIT HEART LEFT (KITS) ×3 IMPLANT
PACK CARDIAC CATHETERIZATION (CUSTOM PROCEDURE TRAY) ×3 IMPLANT
SHEATH PINNACLE 5F 10CM (SHEATH) ×2 IMPLANT
TRANSDUCER W/STOPCOCK (MISCELLANEOUS) ×3 IMPLANT
WIRE EMERALD 3MM-J .035X150CM (WIRE) ×2 IMPLANT

## 2016-12-15 NOTE — Consult Note (Signed)
   [facility] CM Inpatient Consult   12/15/2016  Eddie Graves March 15, 1931 [id number]   Referral received from inpatient RNCM, Steward Drone regarding post hospital follow up.  Came by to see the patient and he was in a procedure.  Patient is currently back on the unit but receiving nursing care.  Will continue to follow.

## 2016-12-15 NOTE — Interval H&P Note (Signed)
Cath Lab Visit (complete for each Cath Lab visit)  Clinical Evaluation Leading to the Procedure:   ACS: Yes.    Non-ACS:    Anginal Classification: CCS IV  Anti-ischemic medical therapy: Maximal Therapy (2 or more classes of medications)  Non-Invasive Test Results: No non-invasive testing performed  Prior CABG: No previous CABG      History and Physical Interval Note:  12/15/2016 10:03 AM  Eddie Graves  has presented today for surgery, with the diagnosis of acs  The various methods of treatment have been discussed with the patient and family. After consideration of risks, benefits and other options for treatment, the patient has consented to  Procedure(s): Left Heart Cath and Coronary Angiography (N/A) as a surgical intervention .  The patient's history has been reviewed, patient examined, no change in status, stable for surgery.  I have reviewed the patient's chart and labs.  Questions were answered to the patient's satisfaction.     Rinaldo Cloud

## 2016-12-15 NOTE — H&P (View-Only) (Signed)
Subjective:  Denies any further chest pain. Coughing and breathing has improved. Troponin I are trending down.  Objective:  Vital Signs in the last 24 hours: Temp:  [97.4 F (36.3 C)-98.7 F (37.1 C)] 97.4 F (36.3 C) (01/08 1122) Pulse Rate:  [81-100] 81 (01/08 1122) Resp:  [14-20] 15 (01/08 1122) BP: (133-164)/(72-105) 133/72 (01/08 1122) SpO2:  [93 %-98 %] 96 % (01/08 1122) Weight:  [208 lb 14.4 oz (94.8 kg)] 208 lb 14.4 oz (94.8 kg) (01/08 0447)  Intake/Output from previous day: 01/07 0701 - 01/08 0700 In: 1115.4 [P.O.:480; I.V.:585.4; IV Piggyback:50] Out: 1300 [Urine:1300] Intake/Output from this shift: Total I/O In: 240 [P.O.:240] Out: 300 [Urine:300]  Physical Exam: Neck: no adenopathy, no carotid bruit, no JVD and supple, symmetrical, trachea midline Lungs: Decreased breath sound at bases with faint expiratory wheezing noted Heart: regular rate and rhythm, S1, S2 normal and Soft systolic murmur noted Abdomen: soft, non-tender; bowel sounds normal; no masses,  no organomegaly Extremities: extremities normal, atraumatic, no cyanosis or edema  Lab Results:  Recent Labs  12/13/16 0228 12/14/16 0647  WBC 12.0* 17.8*  HGB 13.4 14.1  PLT 229 233    Recent Labs  12/13/16 0228 12/14/16 0647  NA 138 138  K 4.4 4.4  CL 101 102  CO2 27 27  GLUCOSE 166* 229*  BUN 13 28*  CREATININE 1.23 1.46*    Recent Labs  12/13/16 0228 12/14/16 0647  TROPONINI 1.29* 0.58*   Hepatic Function Panel  Recent Labs  12/12/16 1636  PROT 7.5  ALBUMIN 3.7  AST 45*  ALT 17  ALKPHOS 96  BILITOT 0.3    Recent Labs  12/13/16 0228  CHOL 140   No results for input(s): PROTIME in the last 72 hours.  Imaging: Imaging results have been reviewed and No results found.  Cardiac Studies:  Assessment/Plan:  Acute small non-Q-wave myocardial infarction Positive nuclear stress test and recent past Multivessel CAD status post PCI to LAD and RCA in the  past Hypertension Diabetes mellitus Diabetic neuropathy Hyperlipidemia COPD Exacerbation of COPD Tobacco abuse Degenerative joint disease Chronic kidney disease stage II/3 Seizure disorder Plan as per orders Discussed with patient regarding left cath possible PTCA stenting its risk and benefits i.e. death MI stroke need for emergency CABG local vascular complications etc. and consents for PCI   LOS: 2 days    Eddie Graves 12/14/2016, 11:51 AM    

## 2016-12-15 NOTE — Progress Notes (Addendum)
Site area: RFA Site Prior to Removal:  Level 0 Pressure Applied For:18min Manual: yes   Patient Status During Pull:  stable Post Pull Site:  Level 0 Post Pull Instructions Given:yes   Post Pull Pulses Present: palpable Dressing Applied:  tegaderm Bedrest begins @ 1150 till 1550 Comments: relieved by Almira Coaster

## 2016-12-16 LAB — CBC
HCT: 41.6 % (ref 39.0–52.0)
HEMOGLOBIN: 13.8 g/dL (ref 13.0–17.0)
MCH: 28.1 pg (ref 26.0–34.0)
MCHC: 33.2 g/dL (ref 30.0–36.0)
MCV: 84.7 fL (ref 78.0–100.0)
PLATELETS: 189 10*3/uL (ref 150–400)
RBC: 4.91 MIL/uL (ref 4.22–5.81)
RDW: 14 % (ref 11.5–15.5)
WBC: 10.8 10*3/uL — ABNORMAL HIGH (ref 4.0–10.5)

## 2016-12-16 LAB — BASIC METABOLIC PANEL
ANION GAP: 6 (ref 5–15)
BUN: 24 mg/dL — ABNORMAL HIGH (ref 6–20)
CALCIUM: 9 mg/dL (ref 8.9–10.3)
CO2: 26 mmol/L (ref 22–32)
CREATININE: 1.19 mg/dL (ref 0.61–1.24)
Chloride: 101 mmol/L (ref 101–111)
GFR calc Af Amer: >60 mL/min (ref >60)
GFR, EST NON AFRICAN AMERICAN: 55 mL/min — AB (ref >60)
GLUCOSE: 233 mg/dL — AB (ref 65–99)
Potassium: 5.1 mmol/L (ref 3.5–5.1)
Sodium: 133 mmol/L — ABNORMAL LOW (ref 135–145)

## 2016-12-16 LAB — GLUCOSE, CAPILLARY
GLUCOSE-CAPILLARY: 244 mg/dL — AB (ref 65–99)
Glucose-Capillary: 236 mg/dL — ABNORMAL HIGH (ref 65–99)
Glucose-Capillary: 179 mg/dL — ABNORMAL HIGH (ref 65–99)

## 2016-12-16 MED ORDER — PREDNISONE 20 MG PO TABS: 20.0000 mg | ORAL_TABLET | Freq: Every day | ORAL | Status: DC

## 2016-12-16 MED ORDER — LEVOFLOXACIN 500 MG PO TABS: 500.0000 mg | ORAL_TABLET | Freq: Every day | ORAL | 0 refills | Status: DC

## 2016-12-16 MED ORDER — CLOPIDOGREL BISULFATE 75 MG PO TABS: 75.0000 mg | ORAL_TABLET | Freq: Every day | ORAL | 3 refills | Status: AC

## 2016-12-16 MED ORDER — AMLODIPINE BESYLATE 10 MG PO TABS: 5.0000 mg | ORAL_TABLET | Freq: Every day | ORAL | 3 refills | Status: DC

## 2016-12-16 MED ORDER — LEVOFLOXACIN 500 MG PO TABS
500.0000 mg | ORAL_TABLET | Freq: Every day | ORAL | Status: DC
  Administered 2016-12-16: 500 mg via ORAL
  Filled 2016-12-16: qty 1

## 2016-12-16 MED ORDER — NITROGLYCERIN 0.4 MG SL SUBL: 0.4000 mg | SUBLINGUAL_TABLET | SUBLINGUAL | 12 refills | Status: DC | PRN

## 2016-12-16 MED ORDER — TIOTROPIUM BROMIDE MONOHYDRATE 18 MCG IN CAPS
18.0000 ug | ORAL_CAPSULE | Freq: Every day | RESPIRATORY_TRACT | Status: DC
  Filled 2016-12-16: qty 5

## 2016-12-16 MED ORDER — TIOTROPIUM BROMIDE MONOHYDRATE 18 MCG IN CAPS: 18.0000 ug | ORAL_CAPSULE | Freq: Every day | RESPIRATORY_TRACT | 12 refills | Status: DC

## 2016-12-16 MED ORDER — MOMETASONE FURO-FORMOTEROL FUM 200-5 MCG/ACT IN AERO: 2.0000 | INHALATION_SPRAY | Freq: Two times a day (BID) | RESPIRATORY_TRACT | 3 refills | Status: AC

## 2016-12-16 MED ORDER — PREDNISONE 20 MG PO TABS: 20.0000 mg | ORAL_TABLET | Freq: Every day | ORAL | 0 refills | Status: DC

## 2016-12-16 NOTE — Discharge Instructions (Signed)
Coronary Angiogram A coronary angiogram is an X-ray procedure that is used to examine the arteries in the heart. In this procedure, a dye (contrast dye) is injected through a long, thin tube (catheter). The catheter is inserted through the groin, wrist, or arm. The dye is injected into each artery, then X-rays are taken to show if there is a blockage in the arteries of the heart. This procedure can also show if you have valve disease or a disease of the aorta, and it can be used to check the overall function of your heart muscle. You may have a coronary angiogram if:  You are having chest pain, or other symptoms of angina, and you are at risk for heart disease.  You have an abnormal electrocardiogram (ECG) or stress test.  You have chest pain and heart failure.  You are having irregular heart rhythms.  You and your health care provider determine that the benefits of the test information outweigh the risks of the procedure. Let your health care provider know about:  Any allergies you have, including allergies to contrast dye.  All medicines you are taking, including vitamins, herbs, eye drops, creams, and over-the-counter medicines.  Any problems you or family members have had with anesthetic medicines.  Any blood disorders you have.  Any surgeries you have had.  History of kidney problems or kidney failure.  Any medical conditions you have.  Whether you are pregnant or may be pregnant. What are the risks? Generally, this is a safe procedure. However, problems may occur, including:  Infection.  Allergic reaction to medicines or dyes that are used.  Bleeding from the access site or other locations.  Kidney injury, especially in people with impaired kidney function.  Stroke (rare).  Heart attack (rare).  Damage to other structures or organs. What happens before the procedure? Staying hydrated  Follow instructions from your health care provider about hydration, which may  include:  Up to 2 hours before the procedure - you may continue to drink clear liquids, such as water, clear fruit juice, black coffee, and plain tea. Eating and drinking restrictions  Follow instructions from your health care provider about eating and drinking, which may include:  8 hours before the procedure - stop eating heavy meals or foods such as meat, fried foods, or fatty foods.  6 hours before the procedure - stop eating light meals or foods, such as toast or cereal.  2 hours before the procedure - stop drinking clear liquids. General instructions  Ask your health care provider about:  Changing or stopping your regular medicines. This is especially important if you are taking diabetes medicines or blood thinners.  Taking medicines such as ibuprofen. These medicines can thin your blood. Do not take these medicines before your procedure if your health care provider instructs you not to, though aspirin may be recommended prior to coronary angiograms.  Plan to have someone take you home from the hospital or clinic.  You may need to have blood tests or X-rays done. What happens during the procedure?  An IV tube will be inserted into one of your veins.  You will be given one or more of the following:  A medicine to help you relax (sedative).  A medicine to numb the area where the catheter will be inserted into an artery (local anesthetic).  To reduce your risk of infection:  Your health care team will wash or sanitize their hands.  Your skin will be washed with soap.  Hair may  be removed from the area where the catheter will be inserted.  You will be connected to a continuous ECG monitor.  The catheter will be inserted into an artery. The location may be in your groin, in your wrist, or in the fold of your arm (near your elbow).  A type of X-ray (fluoroscopy) will be used to help guide the catheter to the opening of the blood vessel that is being examined.  A dye will  be injected into the catheter, and X-rays will be taken. The dye will help to show where any narrowing or blockages are located in the heart arteries.  Tell your health care provider if you have any chest pain or trouble breathing during the procedure.  If blockages are found, your health care provider may perform another procedure, such as inserting a coronary stent. The procedure may vary among health care providers and hospitals. What happens after the procedure?  After the procedure, you will need to keep the area still for a few hours, or for as long as told by your health care provider. If the procedure is done through the groin, you will be instructed to not bend and not cross your legs.  The insertion site will be checked frequently.  The pulse in your foot or wrist will be checked frequently.  You may have additional blood tests, X-rays, and a test that records the electrical activity of your heart (ECG).  Do not drive for 24 hours if you were given a sedative. Summary  A coronary angiogram is an X-ray procedure that is used to look into the arteries in the heart.  During the procedure, a dye (contrast dye) is injected through a long, thin tube (catheter). The catheter is inserted through the groin, wrist, or arm.  Tell your health care provider about any allergies you have, including allergies to contrast dye.  After the procedure, you will need to keep the area still for a few hours, or for as long as told by your health care provider. This information is not intended to replace advice given to you by your health care provider. Make sure you discuss any questions you have with your health care provider. Document Released: 05/30/2003 Document Revised: 09/04/2016 Document Reviewed: 09/04/2016 Elsevier Interactive Patient Education  2017 [employer] Acute Coronary Syndrome Acute coronary syndrome (ACS) is a serious problem in which there is suddenly not enough blood and  oxygen supplied to the heart. ACS may mean that one or more of the blood vessels in your heart (coronary arteries) may be blocked. ACS can result in chest pain or a heart attack (myocardial infarction or MI). What are the causes? This condition is caused by atherosclerosis, which is the buildup of fat and cholesterol (plaque) on the inside of the arteries. Over time, the plaque may narrow or block the artery, and this will lessen blood flow to the heart. Plaque can also become weak and break off within a coronary artery to form a clot and cause a sudden blockage. What increases the risk? The risk factors of this condition include:  High cholesterol levels.  High blood pressure (hypertension).  Smoking.  Diabetes.  Age.  Family history of chest pain, heart disease, or stroke.  Lack of exercise. What are the signs or symptoms? The most common signs of this condition include:  Chest pain, which can be:  A crushing or squeezing in the chest.  A tightness, pressure, fullness, or heaviness in the chest.  Present for more  than a few minutes, or it can stop and recur.  Pain in the arms, neck, jaw, or back.  Unexplained heartburn or indigestion.  Shortness of breath.  Nausea.  Sudden cold sweats.  Feeling light-headed or dizzy. Sometimes, this condition has no symptoms. How is this diagnosed? ACS may be diagnosed through the following tests:  Electrocardiogram (ECG).  Blood tests.  Coronary angiogram. This is a procedure to look at the coronary arteries to see if there is any blockage. How is this treated? Treatment for ACS may include:  Healthy behavioral changes to reduce or control risk factors.  Medicine.  Coronary stenting.A stent helps to keep an artery open.  Coronary angioplasty. This procedure widens a narrowed or blocked artery.  Coronary artery bypass surgery. This will allow your blood to pass the blockage (bypass) to reach your heart. Follow these  instructions at home: Eating and drinking  Follow a heart-healthy diet. A dietitian can you help to educate you about healthy food options and changes.  Use healthy cooking methods such as roasting, grilling, broiling, baking, poaching, steaming, or stir-frying. Talk to a dietitian to learn more about healthy cooking methods. Medicines  Take medicines only as directed by your health care provider.  Do not take the following medicines unless your health care provider approves:  Nonsteroidal anti-inflammatory drugs (NSAIDs), such as ibuprofen, naproxen, or celecoxib.  Vitamin supplements that contain vitamin A, vitamin E, or both.  Hormone replacement therapy that contains estrogen with or without progestin.  Stop illegal drug use. Activity  Follow an exercise program that is approved by your health care provider.  Plan rest periods when you are fatigued. Lifestyle  Do not use any tobacco products, including cigarettes, chewing tobacco, or electronic cigarettes. If you need help quitting, ask your health care provider.  If you drink alcohol, and your health care provider approves, limit your alcohol intake to no more than 1 drink per day. One drink equals 12 ounces of beer, 5 ounces of wine, or 1 ounces of hard liquor.  Learn to manage stress.  Maintain a healthy weight. Lose weight as approved by your health care provider. General instructions  Manage other health conditions, such as hypertension and diabetes, as directed by your health care provider.  Keep all follow-up visits as directed by your health care provider. This is important.  Your health care provider may ask you to monitor your blood pressure. A blood pressure reading consists of a higher number over a lower number, such as 110 over 72, written as 110/72. Ideally, your blood pressure should be:  Below 140/90 if you have no other medical conditions.  Below 130/80 if you have diabetes or kidney disease. Get  help right away if:  You have pain in your chest, neck, arm, jaw, stomach, or back that lasts more than a few minutes, is recurring, or is not relieved by taking medicine under your tongue (sublingual nitroglycerin).  You have profuse sweating without cause.  You have unexplained:  Heartburn or indigestion.  Shortness of breath or difficulty breathing.  Nausea or vomiting.  Fatigue.  Feelings of nervousness or anxiety.  Weakness.  Diarrhea.  You have sudden light-headedness or dizziness.  You faint. These symptoms may represent a serious problem that is an emergency. Do not wait to see if the symptoms will go away. Get medical help right away. Call your local emergency services (911 in the [place] Do not drive yourself to the clinic or hospital.  This information is not intended  to replace advice given to you by your health care provider. Make sure you discuss any questions you have with your health care provider. Document Released: 11/23/2005 Document Revised: 05/06/2016 Document Reviewed: 03/27/2014 Elsevier Interactive Patient Education  2017 [employer]    Chronic Obstructive Pulmonary Disease Chronic obstructive pulmonary disease (COPD) is a common lung problem. In COPD, the flow of air from the lungs is limited. The way your lungs work will probably never return to normal, but there are things you can do to improve your lungs and make yourself feel better. Your doctor may treat your condition with:  Medicines.  Oxygen.  Lung surgery.  Changes to your diet.  Rehabilitation. This may involve a team of specialists. Follow these instructions at home:  Take all medicines as told by your doctor.  Avoid medicines or cough syrups that dry up your airway (such as antihistamines) and do not allow you to get rid of thick spit. You do not need to avoid them if told differently by your doctor.  If you smoke, stop. Smoking makes the problem worse.  Avoid being around  things that make your breathing worse (like smoke, chemicals, and fumes).  Use oxygen therapy and therapy to help improve your lungs (pulmonary rehabilitation) if told by your doctor. If you need home oxygen therapy, ask your doctor if you should buy a tool to measure your oxygen level (oximeter).  Avoid people who have a sickness you can catch (contagious).  Avoid going outside when it is very hot, cold, or humid.  Eat healthy foods. Eat smaller meals more often. Rest before meals.  Stay active, but remember to also rest.  Make sure to get all the shots (vaccines) your doctor recommends. Ask your doctor if you need a pneumonia shot.  Learn and use tips on how to relax.  Learn and use tips on how to control your breathing as told by your doctor. Try: 1. Breathing in (inhaling) through your nose for 1 second. Then, pucker your lips and breath out (exhale) through your lips for 2 seconds. 2. Putting one hand on your belly (abdomen). Breathe in slowly through your nose for 1 second. Your hand on your belly should move out. Pucker your lips and breathe out slowly through your lips. Your hand on your belly should move in as you breathe out.  Learn and use controlled coughing to clear thick spit from your lungs. The steps are: 1. Lean your head a little forward. 2. Breathe in deeply. 3. Try to hold your breath for 3 seconds. 4. Keep your mouth slightly open while coughing 2 times. 5. Spit any thick spit out into a tissue. 6. Rest and do the steps again 1 or 2 times as needed. Contact a doctor if:  You cough up more thick spit than usual.  There is a change in the color or thickness of the spit.  It is harder to breathe than usual.  Your breathing is faster than usual. Get help right away if:  You have shortness of breath while resting.  You have shortness of breath that stops you from:  Being able to talk.  Doing normal activities.  You chest hurts for longer than 5  minutes.  Your skin color is more blue than usual.  Your pulse oximeter shows that you have low oxygen for longer than 5 minutes. This information is not intended to replace advice given to you by your health care provider. Make sure you discuss any questions you  have with your health care provider. Document Released: 05/11/2008 Document Revised: 04/30/2016 Document Reviewed: 07/20/2013 Elsevier Interactive Patient Education  2017 [employer]   Diabetes Mellitus and Food It is important for you to manage your blood sugar (glucose) level. Your blood glucose level can be greatly affected by what you eat. Eating healthier foods in the appropriate amounts throughout the day at about the same time each day will help you control your blood glucose level. It can also help slow or prevent worsening of your diabetes mellitus. Healthy eating may even help you improve the level of your blood pressure and reach or maintain a healthy weight. General recommendations for healthful eating and cooking habits include:  Eating meals and snacks regularly. Avoid going long periods of time without eating to lose weight.  Eating a diet that consists mainly of plant-based foods, such as fruits, vegetables, nuts, legumes, and whole grains.  Using low-heat cooking methods, such as baking, instead of high-heat cooking methods, such as deep frying. Work with your dietitian to make sure you understand how to use the Nutrition Facts information on food labels. How can food affect me? Carbohydrates  Carbohydrates affect your blood glucose level more than any other type of food. Your dietitian will help you determine how many carbohydrates to eat at each meal and teach you how to count carbohydrates. Counting carbohydrates is important to keep your blood glucose at a healthy level, especially if you are using insulin or taking certain medicines for diabetes mellitus. Alcohol  Alcohol can cause sudden decreases in blood  glucose (hypoglycemia), especially if you use insulin or take certain medicines for diabetes mellitus. Hypoglycemia can be a life-threatening condition. Symptoms of hypoglycemia (sleepiness, dizziness, and disorientation) are similar to symptoms of having too much alcohol. If your health care provider has given you approval to drink alcohol, do so in moderation and use the following guidelines:  Women should not have more than one drink per day, and men should not have more than two drinks per day. One drink is equal to:  12 oz of beer.  5 oz of wine.  1 oz of hard liquor.  Do not drink on an empty stomach.  Keep yourself hydrated. Have water, diet soda, or unsweetened iced tea.  Regular soda, juice, and other mixers might contain a lot of carbohydrates and should be counted. What foods are not recommended? As you make food choices, it is important to remember that all foods are not the same. Some foods have fewer nutrients per serving than other foods, even though they might have the same number of calories or carbohydrates. It is difficult to get your body what it needs when you eat foods with fewer nutrients. Examples of foods that you should avoid that are high in calories and carbohydrates but low in nutrients include:  Trans fats (most processed foods list trans fats on the Nutrition Facts label).  Regular soda.  Juice.  Candy.  Sweets, such as cake, pie, doughnuts, and cookies.  Fried foods. What foods can I eat? Eat nutrient-rich foods, which will nourish your body and keep you healthy. The food you should eat also will depend on several factors, including:  The calories you need.  The medicines you take.  Your weight.  Your blood glucose level.  Your blood pressure level.  Your cholesterol level. You should eat a variety of foods, including:  Protein.  Lean cuts of meat.  Proteins low in saturated fats, such as fish, egg whites,  and beans. Avoid processed  meats.  Fruits and vegetables.  Fruits and vegetables that may help control blood glucose levels, such as apples, mangoes, and yams.  Dairy products.  Choose fat-free or low-fat dairy products, such as milk, yogurt, and cheese.  Grains, bread, pasta, and rice.  Choose whole grain products, such as multigrain bread, whole oats, and brown rice. These foods may help control blood pressure.  Fats.  Foods containing healthful fats, such as nuts, avocado, olive oil, canola oil, and fish. Does everyone with diabetes mellitus have the same meal plan? Because every person with diabetes mellitus is different, there is not one meal plan that works for everyone. It is very important that you meet with a dietitian who will help you create a meal plan that is just right for you. This information is not intended to replace advice given to you by your health care provider. Make sure you discuss any questions you have with your health care provider. Document Released: 08/20/2005 Document Revised: 04/30/2016 Document Reviewed: 10/20/2013 Elsevier Interactive Patient Education  2017 [employer].

## 2016-12-16 NOTE — Progress Notes (Addendum)
Inpatient Diabetes Program Recommendations  AACE/ADA: New Consensus Statement on Inpatient Glycemic Control (2015)  Target Ranges:  Prepandial:   less than 140 mg/dL      Peak postprandial:   less than 180 mg/dL (1-2 hours)      Critically ill patients:  140 - 180 mg/dL   Lab Results  Component Value Date   GLUCAP 244 (H) 12/16/2016   HGBA1C 7.2 (H) 12/12/2016   Results for [name], [name] (MRN [id number]) as of 12/16/2016 10:38  Ref. Range 12/15/2016 11:18 12/15/2016 21:10 12/16/2016 07:52  Glucose-Capillary Latest Ref Range: 65 - 99 mg/dL 119 (H) 147 (H) 829 (H)   Review of Glycemic Control  Diabetes history: DM, steroids this admission, BUN=28, Creatinine=1.46, elderly Outpatient Diabetes medications: [name] 75/25 40 units BID, Tradjenta 5 mg daily Current orders for Inpatient glycemic control: Novolog Mix 70/30 25 units BID  Inpatient Diabetes Program Recommendations:    Please consider continuing Tradjenta 5 mg daily while inpatient.    Please consider increasing Novolog Mix 70/30 to 35 units BID as CBG's remain elevated.    Please consider Novolog 0-9 units TIDAC sensitve sliding scale.  Thank you,  Kristine Linea, RN, MSN Diabetes Coordinator Inpatient Diabetes Program [phone] (Team Pager)

## 2016-12-16 NOTE — Care Management Important Message (Signed)
Important Message  Patient Details  Name: Neale Marzette MRN: [id number] Date of Birth: [birth year]   Medicare Important Message Given:  Yes    Lella Mullany 12/16/2016, 11:15 AM

## 2016-12-16 NOTE — Progress Notes (Signed)
Discharge order received.  Discharge instructions reviewed with and given to patient and son.  Patient taken to main entrance via wheelchair.  Patient in no active distress.

## 2016-12-16 NOTE — Discharge Summary (Signed)
Discharge summary dictated on 12/16/2017, dictation number is [phone]

## 2016-12-17 NOTE — Discharge Summary (Signed)
NAME:  Eddie Graves, Eddie Graves              ACCOUNT NO.:  [id number]  MEDICAL RECORD NO.:  [id number]  LOCATION:                                 FACILITY:  PHYSICIAN:  Shalia Bartko N. Sharyn Lull, M.D. DATE OF BIRTH:  Apr 01, 1931  DATE OF ADMISSION:  12/12/2016 DATE OF DISCHARGE:  12/16/2016                              DISCHARGE SUMMARY   ADMITTING DIAGNOSES: 1. Acute small non-Q-wave myocardial infarction.  Positive nuclear     stress test in recent past. 2. Multivessel coronary artery disease status post percutaneous     coronary intervention to left anterior descending and right     coronary artery in the past. 3. Hypertension. 4. Diabetes mellitus. 5. Diabetic neuropathy. 6. Hyperlipidemia. 7. Exacerbation of chronic obstructive pulmonary disease. 8. Tobacco abuse. 9. Degenerative joint disease. 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 11.Seizure disorder.  DISCHARGE DIAGNOSES: 1. Status post small non-Q-wave myocardial infarction status post     cardiac catheterization. 2. Multivessel coronary artery disease status post percutaneous     coronary intervention to left anterior descending and right     coronary artery in the past, noted to have patent stents. 3. Hypertension. 4. Diabetes mellitus. 5. Diabetic neuropathy. 6. Hyperlipidemia. 7. Status post exacerbation of chronic obstructive pulmonary disease. 8. Tobacco abuse. 9. Degenerative joint disease. 10.Chronic kidney disease, stage 2, improved. 11.Seizure disorder.  DISCHARGE MEDICATIONS: 1. Clopidogrel 75 mg 1 tablet daily. 2. Levaquin 500 mg 1 tablet daily for 5 days. 3. Dulera 2 puffs twice daily. 4. Prednisone 20 mg daily for 3 days. 5. Spiriva Handy inhaler 1 inhalation daily. 6. Aspirin 81 mg 1 tablet daily. 7. Crestor 20 mg daily. 8. Nexium 40 mg daily. 9. Garlic 1000 mcg daily. 10.Humalog insulin 75/25, 40 units twice daily as before. 11.Xopenex nebulizer 1.25 mg every 4 hours as needed as before. 12.Keppra XR 500  mg 2 tablets daily. 13.Lisinopril 10 mg daily. 14.Metoprolol succinate 100 mg daily. 15.Omega-3 1200 mg capsule 2 daily. 16.ProAir inhaler as needed. 17.Zoloft 50 mg daily. 18.Vitamin D 2000 units daily. 19.Amlodipine 5 mg daily. 20.Tradjenta 5 mg daily. 21.Nitrostat sublingual as needed. 22.Nitro-Dur patch 0.4 mg 1 patch daily.  DIET:  Low salt, low cholesterol 1800 calories ADA diet.  DISCHARGE INSTRUCTIONS:  Monitor blood pressure and blood sugar daily. Post cardiac cath instructions have been given.  FOLLOWUP:  Follow up with me in 1 week.  CONDITION AT DISCHARGE:  Stable.  BRIEF HISTORY AND HOSPITAL COURSE:  [name] is an 83 year old male with past medical history significant for coronary artery disease status post PTCA stenting to LAD and RCA in the past, hypertension, diabetes mellitus, diabetic neuropathy, COPD, tobacco abuse, continues to smoke 4 to 5 cigarettes daily, now in the process of quitting, seizure disorder, chronic kidney disease, stage 2 to 3, blindness.  He came to the ER by EMS complaining of left-sided chest pain radiating to left shoulder and left arm associated with shortness of breath, which woke him up early this morning.  States chest pain was pressure like, grade 4 to 5/10. Took 4 aspirins with partial relief of the chest pain and the patient was noted by EMS to be wheezing, received breathing  treatment and Solu- Medrol with improvement in his breathing.  The patient denies any fever or chills, but states have been coughing for last few days.  He continues to smoke few cigarettes daily as above and in process of quitting.  The patient had nuclear stress test approximately 5 months ago, which showed moderate area of ischemia in the mid and basal inferior wall with EF of 50%.  The patient opted for medical management. The patient was noted to have minimally elevated troponin-I.  EKG done in the ED showed normal sinus rhythm with nonspecific ST-T  wave changes.  PHYSICAL EXAMINATION:  GENERAL:  On examination, he was alert, awake, oriented x3 in no acute distress. VITAL SIGNS:  Blood pressure was 140/73, pulse 94, he was afebrile. EYES:  Conjunctivae pink. NECK:  Supple.  No JVD. LUNGS:  He has bilateral expiratory wheezing. CARDIOVASCULAR:  S1 and S2 was normal.  Soft S4 gallop. ABDOMEN:  Soft, distended, nontender. EXTREMITIES:  There is no clubbing, cyanosis, or edema.  LABORATORY DATA:  Sodium was 138, potassium 4.5.  BUN 8, creatinine 1.11.  BNP was 46.7.  Hemoglobin was 13.7, hematocrit 41.2, white count of 10.4.  Troponin-I was elevated at 0.29, 0.53, 0.71, 1.29, 0.58, which is trending down.  Last electrolytes; sodium 133, potassium 5.1, BUN 24, creatinine 1.19.  Hemoglobin 13.8, hematocrit 41.6, white count of 10.8.  BRIEF HOSPITAL COURSE:  The patient was admitted to step-down unit.  The patient ruled in for small non-Q-wave myocardial infarction due to elevated cardiac enzymes and chest pain.  The patient subsequently underwent left cardiac catheterization as per procedure report.  The patient tolerated the procedure well.  There were no complications. Postprocedure, the patient did not have any episodes of chest pain during the hospital stay.  The patient also received IV Solu-Medrol and breathing treatments via nebulizer with resolution of his wheezing.  IV steroids have been switched to p.o. prednisone along with inhalers.  The patient is up in the chair.  His groin is stable with no evidence of hematoma or bruit.  The patient will be discharged home on above medications and will be followed up in my office in 1 week.     [name]. Sharyn Lull, M.D.     M[facility]/MED[facility]  D:  12/16/2016  T:  12/17/2016  Job:  [id number]

## 2016-12-18 ENCOUNTER — Other Ambulatory Visit: Payer: Self-pay | Admitting: Internal Medicine

## 2016-12-20 DIAGNOSIS — I129 Hypertensive chronic kidney disease with stage 1 through stage 4 chronic kidney disease, or unspecified chronic kidney disease: Secondary | ICD-10-CM | POA: Diagnosis not present

## 2016-12-20 DIAGNOSIS — I2510 Atherosclerotic heart disease of native coronary artery without angina pectoris: Secondary | ICD-10-CM | POA: Diagnosis not present

## 2016-12-20 DIAGNOSIS — M1990 Unspecified osteoarthritis, unspecified site: Secondary | ICD-10-CM | POA: Diagnosis not present

## 2016-12-20 DIAGNOSIS — N183 Chronic kidney disease, stage 3 (moderate): Secondary | ICD-10-CM | POA: Diagnosis not present

## 2016-12-20 DIAGNOSIS — E1140 Type 2 diabetes mellitus with diabetic neuropathy, unspecified: Secondary | ICD-10-CM | POA: Diagnosis not present

## 2016-12-20 DIAGNOSIS — M48061 Spinal stenosis, lumbar region without neurogenic claudication: Secondary | ICD-10-CM | POA: Diagnosis not present

## 2016-12-20 DIAGNOSIS — E1122 Type 2 diabetes mellitus with diabetic chronic kidney disease: Secondary | ICD-10-CM | POA: Diagnosis not present

## 2016-12-20 DIAGNOSIS — Z7952 Long term (current) use of systemic steroids: Secondary | ICD-10-CM | POA: Diagnosis not present

## 2016-12-20 DIAGNOSIS — E785 Hyperlipidemia, unspecified: Secondary | ICD-10-CM | POA: Diagnosis not present

## 2016-12-20 DIAGNOSIS — Z7901 Long term (current) use of anticoagulants: Secondary | ICD-10-CM | POA: Diagnosis not present

## 2016-12-20 DIAGNOSIS — Z9861 Coronary angioplasty status: Secondary | ICD-10-CM | POA: Diagnosis not present

## 2016-12-20 DIAGNOSIS — J441 Chronic obstructive pulmonary disease with (acute) exacerbation: Secondary | ICD-10-CM | POA: Diagnosis not present

## 2016-12-20 DIAGNOSIS — I214 Non-ST elevation (NSTEMI) myocardial infarction: Secondary | ICD-10-CM | POA: Diagnosis not present

## 2016-12-20 DIAGNOSIS — Z794 Long term (current) use of insulin: Secondary | ICD-10-CM | POA: Diagnosis not present

## 2016-12-20 DIAGNOSIS — E559 Vitamin D deficiency, unspecified: Secondary | ICD-10-CM | POA: Diagnosis not present

## 2016-12-20 DIAGNOSIS — H548 Legal blindness, as defined in USA: Secondary | ICD-10-CM | POA: Diagnosis not present

## 2016-12-21 ENCOUNTER — Telehealth: Payer: Self-pay

## 2016-12-21 NOTE — Telephone Encounter (Signed)
I have made 2 separate attempts to contact the patient or family member in charge, in order to follow up from recently being discharged from the hospital. I was unable to leave a message on voicemail, as it is full. 1st attempt was on 12/18/16 at 2 pm and the 2nd attempt was today, 12/21/16 at 2:20pm. AM

## 2016-12-25 DIAGNOSIS — I2510 Atherosclerotic heart disease of native coronary artery without angina pectoris: Secondary | ICD-10-CM | POA: Diagnosis not present

## 2016-12-25 DIAGNOSIS — Z9861 Coronary angioplasty status: Secondary | ICD-10-CM | POA: Diagnosis not present

## 2016-12-25 DIAGNOSIS — I214 Non-ST elevation (NSTEMI) myocardial infarction: Secondary | ICD-10-CM | POA: Diagnosis not present

## 2016-12-25 DIAGNOSIS — N183 Chronic kidney disease, stage 3 (moderate): Secondary | ICD-10-CM | POA: Diagnosis not present

## 2016-12-25 DIAGNOSIS — M1990 Unspecified osteoarthritis, unspecified site: Secondary | ICD-10-CM | POA: Diagnosis not present

## 2016-12-25 DIAGNOSIS — Z7901 Long term (current) use of anticoagulants: Secondary | ICD-10-CM | POA: Diagnosis not present

## 2016-12-25 DIAGNOSIS — E559 Vitamin D deficiency, unspecified: Secondary | ICD-10-CM | POA: Diagnosis not present

## 2016-12-25 DIAGNOSIS — E785 Hyperlipidemia, unspecified: Secondary | ICD-10-CM | POA: Diagnosis not present

## 2016-12-25 DIAGNOSIS — Z7952 Long term (current) use of systemic steroids: Secondary | ICD-10-CM | POA: Diagnosis not present

## 2016-12-25 DIAGNOSIS — E1140 Type 2 diabetes mellitus with diabetic neuropathy, unspecified: Secondary | ICD-10-CM | POA: Diagnosis not present

## 2016-12-25 DIAGNOSIS — Z794 Long term (current) use of insulin: Secondary | ICD-10-CM | POA: Diagnosis not present

## 2016-12-25 DIAGNOSIS — I129 Hypertensive chronic kidney disease with stage 1 through stage 4 chronic kidney disease, or unspecified chronic kidney disease: Secondary | ICD-10-CM | POA: Diagnosis not present

## 2016-12-25 DIAGNOSIS — E1122 Type 2 diabetes mellitus with diabetic chronic kidney disease: Secondary | ICD-10-CM | POA: Diagnosis not present

## 2016-12-25 DIAGNOSIS — J441 Chronic obstructive pulmonary disease with (acute) exacerbation: Secondary | ICD-10-CM | POA: Diagnosis not present

## 2016-12-25 DIAGNOSIS — H548 Legal blindness, as defined in USA: Secondary | ICD-10-CM | POA: Diagnosis not present

## 2016-12-25 DIAGNOSIS — M48061 Spinal stenosis, lumbar region without neurogenic claudication: Secondary | ICD-10-CM | POA: Diagnosis not present

## 2016-12-26 ENCOUNTER — Other Ambulatory Visit: Payer: Self-pay | Admitting: Internal Medicine

## 2016-12-31 DIAGNOSIS — I2510 Atherosclerotic heart disease of native coronary artery without angina pectoris: Secondary | ICD-10-CM | POA: Diagnosis not present

## 2016-12-31 DIAGNOSIS — J441 Chronic obstructive pulmonary disease with (acute) exacerbation: Secondary | ICD-10-CM | POA: Diagnosis not present

## 2016-12-31 DIAGNOSIS — N183 Chronic kidney disease, stage 3 (moderate): Secondary | ICD-10-CM | POA: Diagnosis not present

## 2016-12-31 DIAGNOSIS — M48061 Spinal stenosis, lumbar region without neurogenic claudication: Secondary | ICD-10-CM | POA: Diagnosis not present

## 2016-12-31 DIAGNOSIS — Z9861 Coronary angioplasty status: Secondary | ICD-10-CM | POA: Diagnosis not present

## 2016-12-31 DIAGNOSIS — Z7901 Long term (current) use of anticoagulants: Secondary | ICD-10-CM | POA: Diagnosis not present

## 2016-12-31 DIAGNOSIS — I214 Non-ST elevation (NSTEMI) myocardial infarction: Secondary | ICD-10-CM | POA: Diagnosis not present

## 2016-12-31 DIAGNOSIS — M1990 Unspecified osteoarthritis, unspecified site: Secondary | ICD-10-CM | POA: Diagnosis not present

## 2016-12-31 DIAGNOSIS — H548 Legal blindness, as defined in USA: Secondary | ICD-10-CM | POA: Diagnosis not present

## 2016-12-31 DIAGNOSIS — I129 Hypertensive chronic kidney disease with stage 1 through stage 4 chronic kidney disease, or unspecified chronic kidney disease: Secondary | ICD-10-CM | POA: Diagnosis not present

## 2016-12-31 DIAGNOSIS — E785 Hyperlipidemia, unspecified: Secondary | ICD-10-CM | POA: Diagnosis not present

## 2016-12-31 DIAGNOSIS — E1122 Type 2 diabetes mellitus with diabetic chronic kidney disease: Secondary | ICD-10-CM | POA: Diagnosis not present

## 2016-12-31 DIAGNOSIS — E1140 Type 2 diabetes mellitus with diabetic neuropathy, unspecified: Secondary | ICD-10-CM | POA: Diagnosis not present

## 2016-12-31 DIAGNOSIS — Z794 Long term (current) use of insulin: Secondary | ICD-10-CM | POA: Diagnosis not present

## 2016-12-31 DIAGNOSIS — E559 Vitamin D deficiency, unspecified: Secondary | ICD-10-CM | POA: Diagnosis not present

## 2016-12-31 DIAGNOSIS — Z7952 Long term (current) use of systemic steroids: Secondary | ICD-10-CM | POA: Diagnosis not present

## 2017-01-05 DIAGNOSIS — Z7901 Long term (current) use of anticoagulants: Secondary | ICD-10-CM | POA: Diagnosis not present

## 2017-01-05 DIAGNOSIS — I129 Hypertensive chronic kidney disease with stage 1 through stage 4 chronic kidney disease, or unspecified chronic kidney disease: Secondary | ICD-10-CM | POA: Diagnosis not present

## 2017-01-05 DIAGNOSIS — Z9861 Coronary angioplasty status: Secondary | ICD-10-CM | POA: Diagnosis not present

## 2017-01-05 DIAGNOSIS — E559 Vitamin D deficiency, unspecified: Secondary | ICD-10-CM | POA: Diagnosis not present

## 2017-01-05 DIAGNOSIS — J441 Chronic obstructive pulmonary disease with (acute) exacerbation: Secondary | ICD-10-CM | POA: Diagnosis not present

## 2017-01-05 DIAGNOSIS — N183 Chronic kidney disease, stage 3 (moderate): Secondary | ICD-10-CM | POA: Diagnosis not present

## 2017-01-05 DIAGNOSIS — I214 Non-ST elevation (NSTEMI) myocardial infarction: Secondary | ICD-10-CM | POA: Diagnosis not present

## 2017-01-05 DIAGNOSIS — I25118 Atherosclerotic heart disease of native coronary artery with other forms of angina pectoris: Secondary | ICD-10-CM | POA: Diagnosis not present

## 2017-01-05 DIAGNOSIS — J449 Chronic obstructive pulmonary disease, unspecified: Secondary | ICD-10-CM | POA: Diagnosis not present

## 2017-01-05 DIAGNOSIS — H548 Legal blindness, as defined in USA: Secondary | ICD-10-CM | POA: Diagnosis not present

## 2017-01-05 DIAGNOSIS — Z794 Long term (current) use of insulin: Secondary | ICD-10-CM | POA: Diagnosis not present

## 2017-01-05 DIAGNOSIS — E1122 Type 2 diabetes mellitus with diabetic chronic kidney disease: Secondary | ICD-10-CM | POA: Diagnosis not present

## 2017-01-05 DIAGNOSIS — E1140 Type 2 diabetes mellitus with diabetic neuropathy, unspecified: Secondary | ICD-10-CM | POA: Diagnosis not present

## 2017-01-05 DIAGNOSIS — E785 Hyperlipidemia, unspecified: Secondary | ICD-10-CM | POA: Diagnosis not present

## 2017-01-05 DIAGNOSIS — M1990 Unspecified osteoarthritis, unspecified site: Secondary | ICD-10-CM | POA: Diagnosis not present

## 2017-01-05 DIAGNOSIS — Z7952 Long term (current) use of systemic steroids: Secondary | ICD-10-CM | POA: Diagnosis not present

## 2017-01-05 DIAGNOSIS — I2510 Atherosclerotic heart disease of native coronary artery without angina pectoris: Secondary | ICD-10-CM | POA: Diagnosis not present

## 2017-01-05 DIAGNOSIS — M48061 Spinal stenosis, lumbar region without neurogenic claudication: Secondary | ICD-10-CM | POA: Diagnosis not present

## 2017-01-05 DIAGNOSIS — N189 Chronic kidney disease, unspecified: Secondary | ICD-10-CM | POA: Diagnosis not present

## 2017-01-07 ENCOUNTER — Encounter: Payer: Self-pay | Admitting: Internal Medicine

## 2017-01-07 ENCOUNTER — Ambulatory Visit (INDEPENDENT_AMBULATORY_CARE_PROVIDER_SITE_OTHER): Payer: Medicare Other | Admitting: Internal Medicine

## 2017-01-07 VITALS — BP 148/80 | HR 78 | Temp 98.3°F | Wt 216.0 lb

## 2017-01-07 DIAGNOSIS — E1142 Type 2 diabetes mellitus with diabetic polyneuropathy: Secondary | ICD-10-CM

## 2017-01-07 DIAGNOSIS — M48061 Spinal stenosis, lumbar region without neurogenic claudication: Secondary | ICD-10-CM

## 2017-01-07 DIAGNOSIS — Z794 Long term (current) use of insulin: Secondary | ICD-10-CM | POA: Diagnosis not present

## 2017-01-07 DIAGNOSIS — E1165 Type 2 diabetes mellitus with hyperglycemia: Secondary | ICD-10-CM | POA: Diagnosis not present

## 2017-01-07 DIAGNOSIS — M5416 Radiculopathy, lumbar region: Secondary | ICD-10-CM

## 2017-01-07 DIAGNOSIS — I10 Essential (primary) hypertension: Secondary | ICD-10-CM | POA: Diagnosis not present

## 2017-01-07 DIAGNOSIS — IMO0002 Reserved for concepts with insufficient information to code with codable children: Secondary | ICD-10-CM

## 2017-01-07 DIAGNOSIS — H547 Unspecified visual loss: Secondary | ICD-10-CM

## 2017-01-07 DIAGNOSIS — I25110 Atherosclerotic heart disease of native coronary artery with unstable angina pectoris: Secondary | ICD-10-CM

## 2017-01-07 DIAGNOSIS — Z716 Tobacco abuse counseling: Secondary | ICD-10-CM

## 2017-01-07 MED ORDER — LISINOPRIL 20 MG PO TABS: 20.0000 mg | ORAL_TABLET | Freq: Every day | ORAL | 3 refills | Status: DC

## 2017-01-07 NOTE — Patient Instructions (Signed)
Increase your lisinopril from 10mg  to 20mg  daily to help control your blood pressure better.

## 2017-01-07 NOTE — Progress Notes (Signed)
Location:  [facility] clinic Provider:  Cristo Ausburn L. Renato Gails, D.O., C.M.D.  Code Status: full code Goals of Care:  Advanced Directives 01/07/2017  Does Patient Have a Medical Advance Directive? No  Would patient like information on creating a medical advance directive? No - Patient declined  Pre-existing out of facility DNR order (yellow form or pink MOST form) -   Chief Complaint  Patient presents with  . Hospitalization Follow-up    HPI: Patient is a 83 y.o. male seen today for hospital f/u after MI.  Unfortunately, pt could not be reached to be seen in time for true TOC visit. He was last seen here for his wellness visit on 11/26/16.  He then was hospitalized 12/12/16 for acute small non-Q wave MI with positive nuclear stress test in the past.  He has multivessel CAD s/p percutaneous coronary intervention to LAD and RCA in the past.    He saw Dr. Sharyn Lull two days ago.  No problems with cath site.  BP still running high.  Stress involved.  Since he's been in the hospital, he's only smoked 6 cigarettes.  Will go several days b/w them.  If he could keep from buying them.  Weight up with poor food choices also.  Not exercising whatsoever.  Just going to appts and church and barbershop.  He [facility] [employer] and [facility] all [facility], but he just wants to do his exercise and go home.  Doesn't like to sit there not being able to see his surroundings.  C/o transportation problems, but does not want to use scat bus.  Getting weak midday--had been eating a big breakfast and then having a snack at 1pm.  His son has gotten a kidney transplant--he's doing great and he also got engaged.  He is checking his own blood pressure and blood sugar.  He has shown it to the home health nursing.  He has not gotten his meals on wheels back yet.    His hip pain is getting terrible.  He's wondering if it's old age.  Goes down legs to thighs.  He has lumbar spinal stenosis.   Past Medical History:  Diagnosis Date    . Anxiety state, unspecified   . Blind   . Chronic airway obstruction, not elsewhere classified   . Coronary atherosclerosis of native coronary artery   . Cough   . Depressive disorder, not elsewhere classified   . DM (diabetes mellitus) type II controlled peripheral vascular disorder (HCC)   . Essential hypertension, benign   . H/O hiatal hernia   . Headache(784.0)   . Legal blindness, as defined in [place]   . Memory loss   . Other and unspecified hyperlipidemia   . Pain in joint, site unspecified   . Palpitations   . Rash and other nonspecific skin eruption   . Reflux esophagitis   . Sebaceous cyst   . Spinal stenosis, lumbar region, with neurogenic claudication   . Type II diabetes mellitus with neurological manifestations, uncontrolled (HCC)   . Unspecified constipation   . Vitamin D deficiency     Past Surgical History:  Procedure Laterality Date  . CARDIAC CATHETERIZATION N/A 12/15/2016   Procedure: Left Heart Cath and Coronary Angiography;  Surgeon: Rinaldo Cloud, MD;  Location: [facility] INVASIVE CV LAB;  Service: Cardiovascular;  Laterality: N/A;  . CORONARY ANGIOPLASTY WITH STENT PLACEMENT    . EYE SURGERY     right prosthetic globe  . HERNIA REPAIR  Allergies  Allergen Reactions  . Flexeril [Cyclobenzaprine] Other (See Comments)    delirium  . Nsaids Other (See Comments)    Renal failure    Allergies as of 01/07/2017      Reactions   Flexeril [cyclobenzaprine] Other (See Comments)   delirium   Nsaids Other (See Comments)   Renal failure      Medication List       Accurate as of 01/07/17  4:03 PM. Always use your most recent med list.          amLODipine 5 MG tablet Commonly known as:  NORVASC Take 5 mg by mouth daily.   aspirin EC 81 MG tablet 1/2 tablet by mouth daily per Cardiologist   clopidogrel 75 MG tablet Commonly known as:  PLAVIX Take 1 tablet (75 mg total) by mouth daily with breakfast.   CRESTOR 20 MG tablet Generic drug:   rosuvastatin Take 20 mg by mouth daily. For cholesterol   esomeprazole 40 MG capsule Commonly known as:  NEXIUM TAKE ONE CAPSULE BY MOUTH ONCE DAILY BEFORE BREAKFAST FOR STOMACH   GARLIC PO Take 6,468 mcg by mouth daily.   glucose blood test strip Prodigy test strips, Check blood sugar twice daily DX: E11.65, H54.7 patient with blindness and needs strips with read aloud monitor   Insulin Lispro Prot & Lispro (75-25) 100 UNIT/ML Kwikpen Commonly known as:  [name] 75/25 KWIKPEN Inject 40 Units into the skin 2 (two) times daily with a meal.   levalbuterol 1.25 MG/3ML nebulizer solution Commonly known as:  XOPENEX Take 1.25 mg by nebulization every 4 (four) hours as needed for wheezing.   levETIRAcetam 500 MG 24 hr tablet Commonly known as:  KEPPRA XR TAKE TWO   TABLETS BY MOUTH   DAILY   linagliptin 5 MG Tabs tablet Commonly known as:  TRADJENTA Take one tablet by mouth once daily to control blood sugar   lisinopril 10 MG tablet Commonly known as:  PRINIVIL,ZESTRIL TAKE ONE TABLET   BY MOUTH   DAILY   FOR BLOOD PRESSURE   metoprolol succinate 100 MG 24 hr tablet Commonly known as:  TOPROL-XL Take one tablet by mouth once daily for blood pressure   mometasone-formoterol 200-5 MCG/ACT Aero Commonly known as:  DULERA Inhale 2 puffs into the lungs 2 (two) times daily.   nitroGLYCERIN 0.4 MG SL tablet Commonly known as:  NITROSTAT Place 1 tablet (0.4 mg total) under the tongue every 5 (five) minutes as needed for chest pain.   Omega 3 1200 MG Caps Take 2,400 mg by mouth daily. For cholesterol   PROAIR HFA 108 (90 Base) MCG/ACT inhaler Generic drug:  albuterol INHALE TWO   PUFFS INTO THE LUNGS EVERY   TWO   HOURS IF NEEDED   sertraline 50 MG tablet Commonly known as:  ZOLOFT TAKE ONE TABLET   BY MOUTH   DAILY   tiotropium 18 MCG inhalation capsule Commonly known as:  SPIRIVA Place 1 capsule (18 mcg total) into inhaler and inhale daily.   Vitamin D 2000 units  Caps Take 1 capsule (2,000 Units total) by mouth daily. For vitamin D repletion       Review of Systems:  Review of Systems  Constitutional: Negative for chills, fever and malaise/fatigue.  HENT: Negative for congestion and hearing loss.   Eyes:       Blind  Respiratory: Negative for shortness of breath.   Cardiovascular: Negative for chest pain, palpitations and leg swelling.  None since hospitalization   Gastrointestinal: Negative for abdominal pain, blood in stool, constipation and melena.  Genitourinary: Negative for dysuria.  Musculoskeletal: Positive for back pain. Negative for falls and myalgias.  Skin: Negative for itching and rash.  Neurological: Negative for dizziness, loss of consciousness, weakness and headaches.  Endo/Heme/Allergies: Does not bruise/bleed easily.  Psychiatric/Behavioral: Negative for depression and memory loss. The patient is nervous/anxious. The patient does not have insomnia.     Health Maintenance  Topic Date Due  . OPHTHALMOLOGY EXAM  12/06/2017 (Originally 09/07/2015)  . ZOSTAVAX  12/06/2017 (Originally 06/02/1993)  . FOOT EXAM  05/11/2017  . HEMOGLOBIN A1C  06/11/2017  . TETANUS/TDAP  12/30/2021  . INFLUENZA VACCINE  Completed  . PNA vac Low Risk Adult  Completed    Physical Exam: Vitals:   01/07/17 1544  BP: (!) 148/80  Pulse: 78  Temp: 98.3 F (36.8 C)  TempSrc: Oral  SpO2: 95%  Weight: 216 lb (98 kg)   Body mass index is 34.86 kg/m. Physical Exam  Constitutional: He is oriented to person, place, and time. He appears well-developed and well-nourished. No distress.  Eyes:  Blind, has one glass eye, uses walking stick  Cardiovascular: Normal rate, regular rhythm, normal heart sounds and intact distal pulses.   Pulmonary/Chest: Effort normal and breath sounds normal. No respiratory distress.  Abdominal: Soft. Bowel sounds are normal. He exhibits no distension. There is no tenderness.  Musculoskeletal: He exhibits  tenderness.  Lower lumbar spine and sacroiliac areas into buttocks  Neurological: He is alert and oriented to person, place, and time. No cranial nerve deficit.  Skin: Skin is warm and dry.  Psychiatric: He has a normal mood and affect.    Labs reviewed: Basic Metabolic Panel:  Recent Labs  31/49/70 1636 12/12/16 1637  12/14/16 0647 12/15/16 0401 12/16/16 0537  NA 137  --   < > 138 135 133*  K 4.3  --   < > 4.4 4.4 5.1  CL 103  --   < > 102 100* 101  CO2 21*  --   < > 27 27 26   GLUCOSE 250*  --   < > 229* 227* 233*  BUN 10  --   < > 28* 31* 24*  CREATININE 1.36*  --   < > 1.46* 1.44* 1.19  CALCIUM 9.4  --   < > 9.6 9.2 9.0  MG 1.6*  --   --   --   --   --   TSH  --  0.502  --   --   --   --   < > = values in this interval not displayed. Liver Function Tests:  Recent Labs  10/01/16 1351 11/23/16 1229 12/12/16 1636  AST 26 21 45*  ALT 12 10 17   ALKPHOS 94 86 96  BILITOT 0.4 0.4 0.3  PROT 7.1 7.2 7.5  ALBUMIN 3.8 4.1 3.7   No results for input(s): LIPASE, AMYLASE in the last 8760 hours. No results for input(s): AMMONIA in the last 8760 hours. CBC:  Recent Labs  12/12/16 1001 12/12/16 1636  12/14/16 0647 12/15/16 0401 12/16/16 0537  WBC 10.4 9.2  < > 17.8* 14.9* 10.8*  NEUTROABS 8.1* 8.6*  --   --   --   --   HGB 13.7 13.7  < > 14.1 13.0 13.8  HCT 41.2 41.8  < > 41.9 40.0 41.6  MCV 86.7 86.2  < > 84.5 84.7 84.7  PLT 202 202  < >  233 200 189  < > = values in this interval not displayed. Lipid Panel:  Recent Labs  12/13/16 0228  CHOL 140  HDL 56  LDLCALC 76  TRIG 42  CHOLHDL 2.5   Lab Results  Component Value Date   HGBA1C 7.2 (H) 12/12/2016    Procedures since last visit: Dg Chest 2 View  Result Date: 12/12/2016 CLINICAL DATA:  SOB, chest pain across his chest down to his fingers that started today. HX diabetes EXAM: CHEST  2 VIEW COMPARISON:  11/06/2015 FINDINGS: Cardiac silhouette is borderline enlarged. No mediastinal or hilar masses. No  evidence of adenopathy. Lungs are mildly hyperexpanded. There are prominent bronchovascular markings and mild interstitial thickening most evident in the lower lungs, similar to the prior study. No evidence of pneumonia or pulmonary edema. No pleural effusion or pneumothorax. Skeletal structures are demineralized but grossly intact. IMPRESSION: No acute cardiopulmonary disease. Stable appearance from the prior study. Electronically Signed   By: Amie Portland M.D.   On: 12/12/2016 09:51    Assessment/Plan 1. Atherosclerosis of native coronary artery of native heart with unstable angina pectoris (HCC) - no symptoms since hospitalization -cont same treatments except to increase lisinopril to 20mg  from 10mg  daily b/c of uncontrolled bp - CBC with Differential/Platelet; Future  2. Spinal stenosis of lumbar region with radiculopathy - bilateral hip pain from his lumbar spinal stenosis -is in need of another epidural and previous doctor who did this retired--referred to Dr - Ambulatory referral to Physical Medicine Rehab  3. Uncontrolled type 2 diabetes mellitus with diabetic polyneuropathy, with long-term current use of insulin (HCC) - sugar average has been improved for him actually lately - says he's going [facility] and [facility]  - CBC with Differential/Platelet; Future  4. Essential hypertension, benign - bp not controlled today so increase lisinopril -counseled on other causative factors:  Salty foods, smoking, inactivity, pain and atherosclerosis plus anxiety - lisinopril (PRINIVIL,ZESTRIL) 20 MG tablet; Take 1 tablet (20 mg total) by mouth daily.  Dispense: 90 tablet; Refill: 3  5. Blind -continue use of walking stick and really ideally needs ongoing help with his insulin and bp mgt b/c his son has his own medical problems and it sounds like other family are not willing or able to assist him  6. Tobacco abuse counseling -counseled again today about need to  quit--is smoking much less than he used to  Labs/tests ordered:   Orders Placed This Encounter  Procedures  . CBC with Differential/Platelet    Standing Status:   Future    Standing Expiration Date:   07/07/2017  . Ambulatory referral to Physical Medicine Rehab    Referral Priority:   Routine    Referral Type:   Rehabilitation    Referral Reason:   Specialty Services Required    Requested Specialty:   Physical Medicine and Rehabilitation    Number of Visits Requested:   1   Next appt:  04/12/2017  Derrius Furtick L. Theodore Virgin, D.O. Geriatrics 09/06/2017 [facility] [address], [address] [place] Cell Phone (Mon-Fri 8am-5pm):  [phone] On Call:  [phone] & follow prompts after 5pm & weekends Office Phone:  [phone] Office Fax:  [phone]

## 2017-01-15 ENCOUNTER — Other Ambulatory Visit: Payer: Self-pay | Admitting: Internal Medicine

## 2017-02-02 ENCOUNTER — Encounter: Payer: Self-pay | Admitting: Sports Medicine

## 2017-02-02 ENCOUNTER — Ambulatory Visit (INDEPENDENT_AMBULATORY_CARE_PROVIDER_SITE_OTHER): Payer: Medicare Other | Admitting: Sports Medicine

## 2017-02-02 ENCOUNTER — Ambulatory Visit: Payer: Medicare Other | Admitting: Sports Medicine

## 2017-02-02 DIAGNOSIS — M79672 Pain in left foot: Secondary | ICD-10-CM

## 2017-02-02 DIAGNOSIS — M79671 Pain in right foot: Secondary | ICD-10-CM | POA: Diagnosis not present

## 2017-02-02 DIAGNOSIS — B351 Tinea unguium: Secondary | ICD-10-CM | POA: Diagnosis not present

## 2017-02-02 DIAGNOSIS — E1142 Type 2 diabetes mellitus with diabetic polyneuropathy: Secondary | ICD-10-CM

## 2017-02-02 DIAGNOSIS — M21619 Bunion of unspecified foot: Secondary | ICD-10-CM

## 2017-02-02 DIAGNOSIS — M2142 Flat foot [pes planus] (acquired), left foot: Secondary | ICD-10-CM | POA: Diagnosis not present

## 2017-02-02 DIAGNOSIS — M2141 Flat foot [pes planus] (acquired), right foot: Secondary | ICD-10-CM

## 2017-02-02 NOTE — Progress Notes (Signed)
Subjective: Eddie Graves is a 82 y.o. male patient with history of diabetes who returns to office today complaining of long, painful nails  while ambulating in shoes; unable to trim. Patient is blind. Patient states that the glucose reading this morning was 162. A1c 7.2Patient denies any new changes in medication or new problems. Patient denies any new cramping, numbness, burning or tingling in the legs.  Patient desires diabetic shoes and still has not heard anything since last office visit.  Patient Active Problem List   Diagnosis Date Noted  . Acute non Q wave myocardial infarction ([place]) 12/12/2016  . Uncontrolled type 2 diabetes mellitus with diabetic polyneuropathy, with long-term current use of insulin ([place]) 03/05/2016  . Tobacco abuse counseling 03/05/2016  . Right sided abdominal pain 03/05/2016  . Constipation 03/05/2016  . Chest pain at rest 11/06/2015  . Diastolic heart failure ([place]) 11/06/2015  . Acute bronchitis with chronic obstructive pulmonary disease (COPD) ([place]) 11/06/2015  . Neurological deficit, transient 11/06/2015  . Left facial numbness   . AKI (acute kidney injury) ([place])   . Chest pain 05/19/2015  . History of gonorrhea 05/19/2015  . Hypotension 05/19/2015  . CKD (chronic kidney disease), stage III 05/19/2015  . Involuntary muscle contractions 10/12/2014  . Seizure disorder after [facility] 08/19/2014  . Diabetic peripheral neuropathy associated with type 2 diabetes mellitus ([place]) 07/26/2014  . Blind 07/26/2014  . Tobacco abuse 04/26/2014  . Obesity (BMI 30-39.9) 08/18/2013  . COPD exacerbation ([place]) 08/06/2013  . SAH (subarachnoid hemorrhage) ([place]) 05/30/2013  . Coronary atherosclerosis of native coronary artery   . Vitamin D deficiency   . Memory loss   . COPD (chronic obstructive pulmonary disease) ([place]) 03/16/2013  . Essential hypertension, benign 03/16/2013  . Depressive disorder, not elsewhere classified 03/16/2013  . Spinal stenosis, lumbar region,  with neurogenic claudication 03/16/2013   Current Outpatient Prescriptions on File Prior to Visit  Medication Sig Dispense Refill  . amLODipine (NORVASC) 5 MG tablet Take 5 mg by mouth daily.    Marland Kitchen aspirin EC 81 MG tablet 1/2 tablet by mouth daily per Cardiologist 15 tablet 11  . Cholecalciferol (VITAMIN D) 2000 units CAPS Take 1 capsule (2,000 Units total) by mouth daily. For vitamin D repletion 30 capsule 11  . clopidogrel (PLAVIX) 75 MG tablet Take 1 tablet (75 mg total) by mouth daily with breakfast. 30 tablet 3  . CRESTOR 20 MG tablet Take 20 mg by mouth daily. For cholesterol    . esomeprazole (NEXIUM) 40 MG capsule TAKE ONE CAPSULE BY MOUTH ONCE DAILY BEFORE BREAKFAST FOR STOMACH 30 capsule 3  . GARLIC PO Take 3,428 mcg by mouth daily.     Marland Kitchen glucose blood test strip Prodigy test strips, Check blood sugar twice daily DX: E11.65, H54.7 patient with blindness and needs strips with read aloud monitor 100 each 12  . Insulin Lispro Prot & Lispro ([name] 75/25 KWIKPEN) (75-25) 100 UNIT/ML Kwikpen Inject 40 Units into the skin 2 (two) times daily with a meal. 15 mL 11  . levalbuterol (XOPENEX) 1.25 MG/3ML nebulizer solution Take 1.25 mg by nebulization every 4 (four) hours as needed for wheezing. 72 mL 1  . levETIRAcetam (KEPPRA XR) 500 MG 24 hr tablet TAKE TWO   TABLETS BY MOUTH   DAILY 180 tablet 1  . linagliptin (TRADJENTA) 5 MG TABS tablet Take one tablet by mouth once daily to control blood sugar 30 tablet 5  . lisinopril (PRINIVIL,ZESTRIL) 20 MG tablet Take 1 tablet (20 mg  total) by mouth daily. 90 tablet 3  . metoprolol succinate (TOPROL-XL) 100 MG 24 hr tablet Take one tablet by mouth once daily for blood pressure 90 tablet 3  . mometasone-formoterol (DULERA) 200-5 MCG/ACT AERO Inhale 2 puffs into the lungs 2 (two) times daily. 2 Inhaler 3  . nitroGLYCERIN (NITROSTAT) 0.4 MG SL tablet Place 1 tablet (0.4 mg total) under the tongue every 5 (five) minutes as needed for chest pain. 25  tablet 12  . Omega 3 1200 MG CAPS Take 2,400 mg by mouth daily. For cholesterol    . PROAIR HFA 108 (90 Base) MCG/ACT inhaler INHALE TWO   PUFFS INTO THE LUNGS EVERY   TWO   HOURS IF NEEDED 17 Inhaler 1  . sertraline (ZOLOFT) 50 MG tablet TAKE ONE TABLET   BY MOUTH   DAILY 30 tablet 1  . tiotropium (SPIRIVA) 18 MCG inhalation capsule Place 1 capsule (18 mcg total) into inhaler and inhale daily. 30 capsule 12   No current facility-administered medications on file prior to visit.    Allergies  Allergen Reactions  . Flexeril [Cyclobenzaprine] Other (See Comments)    delirium  . Nsaids Other (See Comments)    Renal failure    Recent Results (from the past 2160 hour(s))  Hemoglobin A1c     Status: Abnormal   Collection Time: 11/23/16 12:29 PM  Result Value Ref Range   Hgb A1c MFr Bld 6.8 (H) <5.7 %    Comment:   For someone without known diabetes, a hemoglobin A1c value of 6.5% or greater indicates that they may have diabetes and this should be confirmed with a follow-up test.   For someone with known diabetes, a value <7% indicates that their diabetes is well controlled and a value greater than or equal to 7% indicates suboptimal control. A1c targets should be individualized based on duration of diabetes, age, comorbid conditions, and other considerations.   Currently, no consensus exists for use of hemoglobin A1c for diagnosis of diabetes for children.      Mean Plasma Glucose 148 mg/dL  CMP with eGFR     Status: Abnormal   Collection Time: 11/23/16 12:29 PM  Result Value Ref Range   Sodium 136 135 - 146 mmol/L   Potassium 4.6 3.5 - 5.3 mmol/L   Chloride 103 98 - 110 mmol/L   CO2 24 20 - 31 mmol/L   Glucose, Bld 233 (H) 65 - 99 mg/dL   BUN 20 7 - 25 mg/dL   Creat 1.52 (H) 0.70 - 1.11 mg/dL    Comment:   For patients > or = 81 years of age: The upper reference limit for Creatinine is approximately 13% higher for people identified as African-American.      Total  Bilirubin 0.4 0.2 - 1.2 mg/dL   Alkaline Phosphatase 86 40 - 115 U/L   AST 21 10 - 35 U/L   ALT 10 9 - 46 U/L   Total Protein 7.2 6.1 - 8.1 g/dL   Albumin 4.1 3.6 - 5.1 g/dL   Calcium 9.4 8.6 - 10.3 mg/dL   GFR, Est African American 48 (L) >=60 mL/min   GFR, Est Non African American 42 (L) >=60 mL/min  Microalbumin/Creatinine Ratio, Urine     Status: Abnormal   Collection Time: 11/23/16  2:11 PM  Result Value Ref Range   Creatinine, Urine 301 20 - 370 mg/dL   Microalb, Ur 21.6 Not estab mg/dL   Microalb Creat Ratio 72 (H) <30 mcg/mg creat  Comment: The ADA has defined abnormalities in albumin excretion as follows:           Category           Result                            (mcg/mg creatinine)                 Normal:    <30       Microalbuminuria:    30 - 299   Clinical albuminuria:    > or = 300   The ADA recommends that at least two of three specimens collected within a 3 - 6 month period be abnormal before considering a patient to be within a diagnostic category.     Basic metabolic panel     Status: Abnormal   Collection Time: 12/12/16 10:01 AM  Result Value Ref Range   Sodium 138 135 - 145 mmol/L   Potassium 4.5 3.5 - 5.1 mmol/L   Chloride 103 101 - 111 mmol/L   CO2 24 22 - 32 mmol/L   Glucose, Bld 180 (H) 65 - 99 mg/dL   BUN 8 6 - 20 mg/dL   Creatinine, Ser 1.11 0.61 - 1.24 mg/dL   Calcium 9.3 8.9 - 10.3 mg/dL   GFR calc non Af Amer 59 (L) >60 mL/min   GFR calc Af Amer >60 >60 mL/min    Comment: (NOTE) The eGFR has been calculated using the CKD EPI equation. This calculation has not been validated in all clinical situations. eGFR's persistently <60 mL/min signify possible Chronic Kidney Disease.    Anion gap 11 5 - 15  CBC with Differential     Status: Abnormal   Collection Time: 12/12/16 10:01 AM  Result Value Ref Range   WBC 10.4 4.0 - 10.5 K/uL   RBC 4.75 4.22 - 5.81 MIL/uL   Hemoglobin 13.7 13.0 - 17.0 g/dL   HCT 41.2 39.0 - 52.0 %   MCV 86.7 78.0  - 100.0 fL   MCH 28.8 26.0 - 34.0 pg   MCHC 33.3 30.0 - 36.0 g/dL   RDW 13.5 11.5 - 15.5 %   Platelets 202 150 - 400 K/uL   Neutrophils Relative % 78 %   Neutro Abs 8.1 (H) 1.7 - 7.7 K/uL   Lymphocytes Relative 16 %   Lymphs Abs 1.7 0.7 - 4.0 K/uL   Monocytes Relative 3 %   Monocytes Absolute 0.3 0.1 - 1.0 K/uL   Eosinophils Relative 3 %   Eosinophils Absolute 0.3 0.0 - 0.7 K/uL   Basophils Relative 0 %   Basophils Absolute 0.0 0.0 - 0.1 K/uL  Brain natriuretic peptide     Status: None   Collection Time: 12/12/16 10:01 AM  Result Value Ref Range   B Natriuretic Peptide 46.7 0.0 - 100.0 pg/mL  I-stat troponin, ED     Status: Abnormal   Collection Time: 12/12/16 10:21 AM  Result Value Ref Range   Troponin i, poc 0.29 (HH) 0.00 - 0.08 ng/mL   Comment NOTIFIED PHYSICIAN    Comment 3            Comment: Due to the release kinetics of cTnI, a negative result within the first hours of the onset of symptoms does not rule out myocardial infarction with certainty. If myocardial infarction is still suspected, repeat the test at appropriate intervals.   Troponin I  Status: Abnormal   Collection Time: 12/12/16  3:32 PM  Result Value Ref Range   Troponin I 0.53 (HH) <0.03 ng/mL    Comment: CRITICAL RESULT CALLED TO, READ BACK BY AND VERIFIED WITH: RN MIN COLLYMORE AT [phone] MARTINB   Glucose, capillary     Status: Abnormal   Collection Time: 12/12/16  4:25 PM  Result Value Ref Range   Glucose-Capillary 254 (H) 65 - 99 mg/dL  Comprehensive metabolic panel     Status: Abnormal   Collection Time: 12/12/16  4:36 PM  Result Value Ref Range   Sodium 137 135 - 145 mmol/L   Potassium 4.3 3.5 - 5.1 mmol/L   Chloride 103 101 - 111 mmol/L   CO2 21 (L) 22 - 32 mmol/L   Glucose, Bld 250 (H) 65 - 99 mg/dL   BUN 10 6 - 20 mg/dL   Creatinine, Ser 1.36 (H) 0.61 - 1.24 mg/dL   Calcium 9.4 8.9 - 10.3 mg/dL   Total Protein 7.5 6.5 - 8.1 g/dL   Albumin 3.7 3.5 - 5.0 g/dL   AST 45 (H) 15 -  41 U/L   ALT 17 17 - 63 U/L   Alkaline Phosphatase 96 38 - 126 U/L   Total Bilirubin 0.3 0.3 - 1.2 mg/dL   GFR calc non Af Amer 46 (L) >60 mL/min   GFR calc Af Amer 54 (L) >60 mL/min    Comment: (NOTE) The eGFR has been calculated using the CKD EPI equation. This calculation has not been validated in all clinical situations. eGFR's persistently <60 mL/min signify possible Chronic Kidney Disease.    Anion gap 13 5 - 15  Magnesium     Status: Abnormal   Collection Time: 12/12/16  4:36 PM  Result Value Ref Range   Magnesium 1.6 (L) 1.7 - 2.4 mg/dL  CBC WITH DIFFERENTIAL     Status: Abnormal   Collection Time: 12/12/16  4:36 PM  Result Value Ref Range   WBC 9.2 4.0 - 10.5 K/uL   RBC 4.85 4.22 - 5.81 MIL/uL   Hemoglobin 13.7 13.0 - 17.0 g/dL   HCT 41.8 39.0 - 52.0 %   MCV 86.2 78.0 - 100.0 fL   MCH 28.2 26.0 - 34.0 pg   MCHC 32.8 30.0 - 36.0 g/dL   RDW 13.6 11.5 - 15.5 %   Platelets 202 150 - 400 K/uL   Neutrophils Relative % 93 %   Neutro Abs 8.6 (H) 1.7 - 7.7 K/uL   Lymphocytes Relative 7 %   Lymphs Abs 0.6 (L) 0.7 - 4.0 K/uL   Monocytes Relative 0 %   Monocytes Absolute 0.0 (L) 0.1 - 1.0 K/uL   Eosinophils Relative 0 %   Eosinophils Absolute 0.0 0.0 - 0.7 K/uL   Basophils Relative 0 %   Basophils Absolute 0.0 0.0 - 0.1 K/uL  Protime-INR     Status: None   Collection Time: 12/12/16  4:36 PM  Result Value Ref Range   Prothrombin Time 14.4 11.4 - 15.2 seconds   INR 1.12   TSH     Status: None   Collection Time: 12/12/16  4:37 PM  Result Value Ref Range   TSH 0.502 0.350 - 4.500 uIU/mL    Comment: Performed by a 3rd Generation assay with a functional sensitivity of <=0.01 uIU/mL.  Hemoglobin A1c     Status: Abnormal   Collection Time: 12/12/16  4:37 PM  Result Value Ref Range   Hgb A1c MFr Bld 7.2 (H)  4.8 - 5.6 %    Comment: (NOTE)         Pre-diabetes: 5.7 - 6.4         Diabetes: >6.4         Glycemic control for adults with diabetes: <7.0    Mean Plasma Glucose  160 mg/dL    Comment: (NOTE) Performed At: [facility] [place], [place] [id number] Lindon Romp MD SH:[id number]   Heparin level (unfractionated)     Status: None   Collection Time: 12/12/16  8:13 PM  Result Value Ref Range   Heparin Unfractionated 0.52 0.30 - 0.70 IU/mL    Comment:        IF HEPARIN RESULTS ARE BELOW EXPECTED VALUES, AND PATIENT DOSAGE HAS BEEN CONFIRMED, SUGGEST FOLLOW UP TESTING OF ANTITHROMBIN III LEVELS.   Troponin I     Status: Abnormal   Collection Time: 12/12/16  8:13 PM  Result Value Ref Range   Troponin I 0.71 (HH) <0.03 ng/mL    Comment: CRITICAL VALUE NOTED.  VALUE IS CONSISTENT WITH PREVIOUSLY REPORTED AND CALLED VALUE.  Glucose, capillary     Status: Abnormal   Collection Time: 12/12/16 11:08 PM  Result Value Ref Range   Glucose-Capillary 219 (H) 65 - 99 mg/dL  MRSA PCR Screening     Status: None   Collection Time: 12/13/16  2:25 AM  Result Value Ref Range   MRSA by PCR NEGATIVE NEGATIVE    Comment:        The GeneXpert MRSA Assay (FDA approved for NASAL specimens only), is one component of a comprehensive MRSA colonization surveillance program. It is not intended to diagnose MRSA infection nor to guide or monitor treatment for MRSA infections.   Troponin I     Status: Abnormal   Collection Time: 12/13/16  2:28 AM  Result Value Ref Range   Troponin I 1.29 (HH) <0.03 ng/mL    Comment: CRITICAL VALUE NOTED.  VALUE IS CONSISTENT WITH PREVIOUSLY REPORTED AND CALLED VALUE.  CBC     Status: Abnormal   Collection Time: 12/13/16  2:28 AM  Result Value Ref Range   WBC 12.0 (H) 4.0 - 10.5 K/uL   RBC 4.81 4.22 - 5.81 MIL/uL   Hemoglobin 13.4 13.0 - 17.0 g/dL   HCT 40.8 39.0 - 52.0 %   MCV 84.8 78.0 - 100.0 fL   MCH 27.9 26.0 - 34.0 pg   MCHC 32.8 30.0 - 36.0 g/dL   RDW 13.7 11.5 - 15.5 %   Platelets 229 150 - 400 K/uL  Basic metabolic panel     Status: Abnormal   Collection Time: 12/13/16  2:28 AM  Result  Value Ref Range   Sodium 138 135 - 145 mmol/L   Potassium 4.4 3.5 - 5.1 mmol/L   Chloride 101 101 - 111 mmol/L   CO2 27 22 - 32 mmol/L   Glucose, Bld 166 (H) 65 - 99 mg/dL   BUN 13 6 - 20 mg/dL   Creatinine, Ser 1.23 0.61 - 1.24 mg/dL   Calcium 9.8 8.9 - 10.3 mg/dL   GFR calc non Af Amer 52 (L) >60 mL/min   GFR calc Af Amer >60 >60 mL/min    Comment: (NOTE) The eGFR has been calculated using the CKD EPI equation. This calculation has not been validated in all clinical situations. eGFR's persistently <60 mL/min signify possible Chronic Kidney Disease.    Anion gap 10 5 - 15  Lipid panel  Status: None   Collection Time: 12/13/16  2:28 AM  Result Value Ref Range   Cholesterol 140 0 - 200 mg/dL   Triglycerides 42 <150 mg/dL   HDL 56 >40 mg/dL   Total CHOL/HDL Ratio 2.5 RATIO   VLDL 8 0 - 40 mg/dL   LDL Cholesterol 76 0 - 99 mg/dL    Comment:        Total Cholesterol/HDL:CHD Risk Coronary Heart Disease Risk Table                     Men   Women  1/2 Average Risk   3.4   3.3  Average Risk       5.0   4.4  2 X Average Risk   9.6   7.1  3 X Average Risk  23.4   11.0        Use the calculated Patient Ratio above and the CHD Risk Table to determine the patient's CHD Risk.        ATP III CLASSIFICATION (LDL):  <100     mg/dL   Optimal  100-129  mg/dL   Near or Above                    Optimal  130-159  mg/dL   Borderline  160-189  mg/dL   High  >190     mg/dL   Very High   Heparin level (unfractionated)     Status: None   Collection Time: 12/13/16  2:28 AM  Result Value Ref Range   Heparin Unfractionated 0.39 0.30 - 0.70 IU/mL    Comment:        IF HEPARIN RESULTS ARE BELOW EXPECTED VALUES, AND PATIENT DOSAGE HAS BEEN CONFIRMED, SUGGEST FOLLOW UP TESTING OF ANTITHROMBIN III LEVELS.   Glucose, capillary     Status: Abnormal   Collection Time: 12/13/16  7:40 AM  Result Value Ref Range   Glucose-Capillary 173 (H) 65 - 99 mg/dL  Glucose, capillary     Status:  Abnormal   Collection Time: 12/13/16 11:35 AM  Result Value Ref Range   Glucose-Capillary 168 (H) 65 - 99 mg/dL  Glucose, capillary     Status: Abnormal   Collection Time: 12/13/16  4:48 PM  Result Value Ref Range   Glucose-Capillary 258 (H) 65 - 99 mg/dL  Glucose, capillary     Status: Abnormal   Collection Time: 12/13/16  8:46 PM  Result Value Ref Range   Glucose-Capillary 202 (H) 65 - 99 mg/dL   Comment 1 Notify RN   CBC     Status: Abnormal   Collection Time: 12/14/16  6:47 AM  Result Value Ref Range   WBC 17.8 (H) 4.0 - 10.5 K/uL   RBC 4.96 4.22 - 5.81 MIL/uL   Hemoglobin 14.1 13.0 - 17.0 g/dL   HCT 41.9 39.0 - 52.0 %   MCV 84.5 78.0 - 100.0 fL   MCH 28.4 26.0 - 34.0 pg   MCHC 33.7 30.0 - 36.0 g/dL   RDW 14.0 11.5 - 15.5 %   Platelets 233 150 - 400 K/uL  Heparin level (unfractionated)     Status: Abnormal   Collection Time: 12/14/16  6:47 AM  Result Value Ref Range   Heparin Unfractionated 0.77 (H) 0.30 - 0.70 IU/mL    Comment:        IF HEPARIN RESULTS ARE BELOW EXPECTED VALUES, AND PATIENT DOSAGE HAS BEEN CONFIRMED, SUGGEST FOLLOW UP TESTING OF ANTITHROMBIN III  LEVELS.   Basic metabolic panel     Status: Abnormal   Collection Time: 12/14/16  6:47 AM  Result Value Ref Range   Sodium 138 135 - 145 mmol/L   Potassium 4.4 3.5 - 5.1 mmol/L   Chloride 102 101 - 111 mmol/L   CO2 27 22 - 32 mmol/L   Glucose, Bld 229 (H) 65 - 99 mg/dL   BUN 28 (H) 6 - 20 mg/dL   Creatinine, Ser 1.46 (H) 0.61 - 1.24 mg/dL   Calcium 9.6 8.9 - 10.3 mg/dL   GFR calc non Af Amer 43 (L) >60 mL/min   GFR calc Af Amer 49 (L) >60 mL/min    Comment: (NOTE) The eGFR has been calculated using the CKD EPI equation. This calculation has not been validated in all clinical situations. eGFR's persistently <60 mL/min signify possible Chronic Kidney Disease.    Anion gap 9 5 - 15  Troponin I     Status: Abnormal   Collection Time: 12/14/16  6:47 AM  Result Value Ref Range   Troponin I 0.58 (HH)  <0.03 ng/mL    Comment: CRITICAL VALUE NOTED.  VALUE IS CONSISTENT WITH PREVIOUSLY REPORTED AND CALLED VALUE.  Glucose, capillary     Status: Abnormal   Collection Time: 12/14/16 11:25 AM  Result Value Ref Range   Glucose-Capillary 216 (H) 65 - 99 mg/dL  Glucose, capillary     Status: Abnormal   Collection Time: 12/14/16  4:23 PM  Result Value Ref Range   Glucose-Capillary 196 (H) 65 - 99 mg/dL  Glucose, capillary     Status: Abnormal   Collection Time: 12/14/16  9:36 PM  Result Value Ref Range   Glucose-Capillary 227 (H) 65 - 99 mg/dL  CBC     Status: Abnormal   Collection Time: 12/15/16  4:01 AM  Result Value Ref Range   WBC 14.9 (H) 4.0 - 10.5 K/uL   RBC 4.72 4.22 - 5.81 MIL/uL   Hemoglobin 13.0 13.0 - 17.0 g/dL   HCT 40.0 39.0 - 52.0 %   MCV 84.7 78.0 - 100.0 fL   MCH 27.5 26.0 - 34.0 pg   MCHC 32.5 30.0 - 36.0 g/dL   RDW 14.0 11.5 - 15.5 %   Platelets 200 150 - 400 K/uL  Heparin level (unfractionated)     Status: None   Collection Time: 12/15/16  4:01 AM  Result Value Ref Range   Heparin Unfractionated 0.42 0.30 - 0.70 IU/mL    Comment:        IF HEPARIN RESULTS ARE BELOW EXPECTED VALUES, AND PATIENT DOSAGE HAS BEEN CONFIRMED, SUGGEST FOLLOW UP TESTING OF ANTITHROMBIN III LEVELS.   Basic metabolic panel     Status: Abnormal   Collection Time: 12/15/16  4:01 AM  Result Value Ref Range   Sodium 135 135 - 145 mmol/L   Potassium 4.4 3.5 - 5.1 mmol/L   Chloride 100 (L) 101 - 111 mmol/L   CO2 27 22 - 32 mmol/L   Glucose, Bld 227 (H) 65 - 99 mg/dL   BUN 31 (H) 6 - 20 mg/dL   Creatinine, Ser 1.44 (H) 0.61 - 1.24 mg/dL   Calcium 9.2 8.9 - 10.3 mg/dL   GFR calc non Af Amer 43 (L) >60 mL/min   GFR calc Af Amer 50 (L) >60 mL/min    Comment: (NOTE) The eGFR has been calculated using the CKD EPI equation. This calculation has not been validated in all clinical situations. eGFR's persistently <60 mL/min  signify possible Chronic Kidney Disease.    Anion gap 8 5 - 15   Glucose, capillary     Status: Abnormal   Collection Time: 12/15/16 11:18 AM  Result Value Ref Range   Glucose-Capillary 205 (H) 65 - 99 mg/dL  POCT Activated clotting time     Status: None   Collection Time: 12/15/16 11:18 AM  Result Value Ref Range   Activated Clotting Time 109 seconds  POCT Activated clotting time     Status: None   Collection Time: 12/15/16 11:18 AM  Result Value Ref Range   Activated Clotting Time 109 seconds  Glucose, capillary     Status: Abnormal   Collection Time: 12/15/16  9:10 PM  Result Value Ref Range   Glucose-Capillary 253 (H) 65 - 99 mg/dL  Basic metabolic panel     Status: Abnormal   Collection Time: 12/16/16  5:37 AM  Result Value Ref Range   Sodium 133 (L) 135 - 145 mmol/L   Potassium 5.1 3.5 - 5.1 mmol/L   Chloride 101 101 - 111 mmol/L   CO2 26 22 - 32 mmol/L   Glucose, Bld 233 (H) 65 - 99 mg/dL   BUN 24 (H) 6 - 20 mg/dL   Creatinine, Ser 1.19 0.61 - 1.24 mg/dL   Calcium 9.0 8.9 - 10.3 mg/dL   GFR calc non Af Amer 55 (L) >60 mL/min   GFR calc Af Amer >60 >60 mL/min    Comment: (NOTE) The eGFR has been calculated using the CKD EPI equation. This calculation has not been validated in all clinical situations. eGFR's persistently <60 mL/min signify possible Chronic Kidney Disease.    Anion gap 6 5 - 15  CBC     Status: Abnormal   Collection Time: 12/16/16  5:37 AM  Result Value Ref Range   WBC 10.8 (H) 4.0 - 10.5 K/uL    Comment: WHITE COUNT CONFIRMED ON SMEAR   RBC 4.91 4.22 - 5.81 MIL/uL   Hemoglobin 13.8 13.0 - 17.0 g/dL   HCT 41.6 39.0 - 52.0 %   MCV 84.7 78.0 - 100.0 fL   MCH 28.1 26.0 - 34.0 pg   MCHC 33.2 30.0 - 36.0 g/dL   RDW 14.0 11.5 - 15.5 %   Platelets 189 150 - 400 K/uL  Glucose, capillary     Status: Abnormal   Collection Time: 12/16/16  7:52 AM  Result Value Ref Range   Glucose-Capillary 244 (H) 65 - 99 mg/dL  Glucose, capillary     Status: Abnormal   Collection Time: 12/16/16 11:21 AM  Result Value Ref Range    Glucose-Capillary 236 (H) 65 - 99 mg/dL  Glucose, capillary     Status: Abnormal   Collection Time: 12/16/16  4:21 PM  Result Value Ref Range   Glucose-Capillary 179 (H) 65 - 99 mg/dL    Objective: General: Patient is awake, alert, and oriented x 3 and in no acute distress.  Integument: Skin is warm, dry and supple bilateral. Nails are tender, long, thickened and dystrophic with subungual debris, consistent with onychomycosis, 1-5 bilateral. No signs of infection. No open lesions or preulcerative lesions present bilateral. History of callus at heels now resolved. Remaining integument unremarkable.  Vasculature:  Dorsalis Pedis pulse 1/4 bilateral. Posterior Tibial pulse  1/4 bilateral. Capillary fill time <3 sec 1-5 bilateral. Scant hair growth to the level of the digits.Temperature gradient within normal limits. No varicosities present bilateral. No edema present bilateral.   Neurology: The patient has intact  sensation measured with a 5.07/10g Semmes Weinstein Monofilament at all pedal sites bilateral . Vibratory sensation diminished bilateral with tuning fork. No Babinski sign present bilateral.   Musculoskeletal: Asymptomatic bunion and pes planus pedal deformities noted bilateral. Muscular strength 5/5 in all lower extremity muscular groups bilateral without pain on range of motion. No tenderness with calf compression bilateral.  Assessment and Plan: Problem List Items Addressed This Visit    None    Visit Diagnoses    Dermatophytosis of nail    -  Primary   Foot pain, bilateral       Diabetic polyneuropathy associated with type 2 diabetes mellitus (HCC)       Bunion       Pes planus of both feet         -Examined patient. -Discussed and educated patient on diabetic foot care, especially with regards to the vascular, neurological and musculoskeletal systems.  -Stressed the importance of good glycemic control and the detriment of not controlling glucose levels in relation to the  foot. -Mechanically debrided all nails 1-5 bilateral using sterile nail nipper and filed with dremel without incident  -[employer] diabetic shoe order form was completed again; office to contact primary care for approval / certification;  Office to arrange shoe fitting and dispensing. -Answered all patient questions -Patient to return  in 3 months for at risk foot care -Patient advised to call the office if any problems or questions arise in the meantime.  Landis Martins, DPM

## 2017-02-09 ENCOUNTER — Other Ambulatory Visit: Payer: Self-pay | Admitting: Internal Medicine

## 2017-02-09 DIAGNOSIS — M5030 Other cervical disc degeneration, unspecified cervical region: Secondary | ICD-10-CM

## 2017-03-08 ENCOUNTER — Other Ambulatory Visit: Payer: Self-pay | Admitting: Internal Medicine

## 2017-03-16 ENCOUNTER — Other Ambulatory Visit: Payer: Self-pay

## 2017-03-16 ENCOUNTER — Other Ambulatory Visit: Payer: Self-pay | Admitting: Internal Medicine

## 2017-03-16 DIAGNOSIS — M5030 Other cervical disc degeneration, unspecified cervical region: Secondary | ICD-10-CM

## 2017-03-16 MED ORDER — INSULIN LISPRO PROT & LISPRO (75-25 MIX) 100 UNIT/ML KWIKPEN: 40.0000 [IU] | PEN_INJECTOR | Freq: Two times a day (BID) | SUBCUTANEOUS | 11 refills | Status: DC

## 2017-03-16 NOTE — Telephone Encounter (Signed)
Electronic refill request came over for mobic, medication was discontinued by another provider, rx was denied with the indication- refill not appropriate   Refill request for humalog received electronically for wrong dose, rx sent with current dose on medication list  Anita/CMA sent

## 2017-03-31 ENCOUNTER — Other Ambulatory Visit: Payer: Self-pay | Admitting: Internal Medicine

## 2017-04-06 ENCOUNTER — Other Ambulatory Visit: Payer: Medicare Other

## 2017-04-06 DIAGNOSIS — I25118 Atherosclerotic heart disease of native coronary artery with other forms of angina pectoris: Secondary | ICD-10-CM | POA: Diagnosis not present

## 2017-04-06 DIAGNOSIS — I252 Old myocardial infarction: Secondary | ICD-10-CM | POA: Diagnosis not present

## 2017-04-06 DIAGNOSIS — Z794 Long term (current) use of insulin: Secondary | ICD-10-CM | POA: Diagnosis not present

## 2017-04-06 DIAGNOSIS — E1165 Type 2 diabetes mellitus with hyperglycemia: Secondary | ICD-10-CM

## 2017-04-06 DIAGNOSIS — J449 Chronic obstructive pulmonary disease, unspecified: Secondary | ICD-10-CM | POA: Diagnosis not present

## 2017-04-06 DIAGNOSIS — E1142 Type 2 diabetes mellitus with diabetic polyneuropathy: Secondary | ICD-10-CM

## 2017-04-06 DIAGNOSIS — IMO0002 Reserved for concepts with insufficient information to code with codable children: Secondary | ICD-10-CM

## 2017-04-06 DIAGNOSIS — N189 Chronic kidney disease, unspecified: Secondary | ICD-10-CM | POA: Diagnosis not present

## 2017-04-06 DIAGNOSIS — I25110 Atherosclerotic heart disease of native coronary artery with unstable angina pectoris: Secondary | ICD-10-CM

## 2017-04-06 DIAGNOSIS — I129 Hypertensive chronic kidney disease with stage 1 through stage 4 chronic kidney disease, or unspecified chronic kidney disease: Secondary | ICD-10-CM | POA: Diagnosis not present

## 2017-04-06 LAB — CBC WITH DIFFERENTIAL/PLATELET
Basophils Absolute: 79 cells/uL (ref 0–200)
Basophils Relative: 1 %
Eosinophils Absolute: 474 cells/uL (ref 15–500)
Eosinophils Relative: 6 %
HCT: 40.1 % (ref 38.5–50.0)
Hemoglobin: 12.9 g/dL — ABNORMAL LOW (ref 13.2–17.1)
Lymphocytes Relative: 33 %
Lymphs Abs: 2607 cells/uL (ref 850–3900)
MCH: 28 pg (ref 27.0–33.0)
MCHC: 32.2 g/dL (ref 32.0–36.0)
MCV: 87 fL (ref 80.0–100.0)
MPV: 10.5 fL (ref 7.5–12.5)
Monocytes Absolute: 869 cells/uL (ref 200–950)
Monocytes Relative: 11 %
Neutro Abs: 3871 cells/uL (ref 1500–7800)
Neutrophils Relative %: 49 %
Platelets: 223 10*3/uL (ref 140–400)
RBC: 4.61 MIL/uL (ref 4.20–5.80)
RDW: 14.6 % (ref 11.0–15.0)
WBC: 7.9 10*3/uL (ref 3.8–10.8)

## 2017-04-12 ENCOUNTER — Ambulatory Visit (INDEPENDENT_AMBULATORY_CARE_PROVIDER_SITE_OTHER): Payer: Medicare Other | Admitting: Internal Medicine

## 2017-04-12 ENCOUNTER — Encounter: Payer: Self-pay | Admitting: Internal Medicine

## 2017-04-12 VITALS — BP 130/80 | HR 86 | Temp 98.0°F | Wt 220.0 lb

## 2017-04-12 DIAGNOSIS — J410 Simple chronic bronchitis: Secondary | ICD-10-CM | POA: Diagnosis not present

## 2017-04-12 DIAGNOSIS — M159 Polyosteoarthritis, unspecified: Secondary | ICD-10-CM | POA: Diagnosis not present

## 2017-04-12 DIAGNOSIS — Z720 Tobacco use: Secondary | ICD-10-CM | POA: Diagnosis not present

## 2017-04-12 DIAGNOSIS — I10 Essential (primary) hypertension: Secondary | ICD-10-CM

## 2017-04-12 DIAGNOSIS — E1142 Type 2 diabetes mellitus with diabetic polyneuropathy: Secondary | ICD-10-CM

## 2017-04-12 DIAGNOSIS — K8689 Other specified diseases of pancreas: Secondary | ICD-10-CM | POA: Diagnosis not present

## 2017-04-12 MED ORDER — PANCRELIPASE (LIP-PROT-AMYL) 36000-114000 UNITS PO CPEP: ORAL_CAPSULE | ORAL | 0 refills | Status: DC

## 2017-04-12 MED ORDER — ACETAMINOPHEN 500 MG PO TABS: 500.0000 mg | ORAL_TABLET | Freq: Three times a day (TID) | ORAL | 3 refills | Status: DC | PRN
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
## 2017-04-28 ENCOUNTER — Other Ambulatory Visit: Payer: Self-pay | Admitting: Internal Medicine

## 2017-05-04 ENCOUNTER — Ambulatory Visit: Payer: Medicare Other | Admitting: Sports Medicine

## 2017-05-04 ENCOUNTER — Telehealth: Payer: Self-pay

## 2017-05-04 DIAGNOSIS — R197 Diarrhea, unspecified: Secondary | ICD-10-CM

## 2017-05-04 NOTE — Telephone Encounter (Signed)
Creon is a chronic medication not just a short-term treatment.  Await stool specimen.

## 2017-05-04 NOTE — Telephone Encounter (Signed)
Orders placed.  Did he still have loose stools when he was actually taking the creon with his meals or just since stopping it?

## 2017-05-04 NOTE — Telephone Encounter (Signed)
Patient's son called to stated that medication that was given to patient on 04/12/17 is not helping with loose stools.   Patient was given samples of Creon 36000 units at 04/12/17 visit.   Patient is still having loose stools multiple times a day.   Please advise as to what patient should do?

## 2017-05-04 NOTE — Telephone Encounter (Signed)
Patient stated that he has finished the Creon over a week ago but he is still having loose stools. Patient is in agreement to collecting stool specimen.   Please place orders for desired tests.   Patient will be by the office tomorrow to pick up stool kit.

## 2017-05-04 NOTE — Telephone Encounter (Signed)
Left message for patient's son to call the office.

## 2017-05-04 NOTE — Telephone Encounter (Signed)
Please place orders for specific stool tests.   Does a refill prescription for Creon need to be sent in for patient?

## 2017-05-04 NOTE — Telephone Encounter (Signed)
First, is he taking it as directed--one capsule with each meal?  Is he eating snacks between w/o the medication?  If so, that could be the problem.    If he is not snacking and taking med as prescribed (don't think he had a very large sample supply--check med list), we need him to bring a stool sample to check for bacterial pathogens and c diff PCR.  If this test is negative, I can prescribe medication to control the diarrhea.  I don't want him using imodium until we know there is no infection.

## 2017-05-05 NOTE — Telephone Encounter (Signed)
Patient stated that he did have some loose stools while taking the medication but he was not always taking the medication with snacks.

## 2017-05-06 ENCOUNTER — Encounter: Payer: Self-pay | Admitting: Internal Medicine

## 2017-05-12 ENCOUNTER — Other Ambulatory Visit: Payer: Self-pay | Admitting: Internal Medicine

## 2017-05-22 DIAGNOSIS — E161 Other hypoglycemia: Secondary | ICD-10-CM | POA: Diagnosis not present

## 2017-05-22 DIAGNOSIS — E162 Hypoglycemia, unspecified: Secondary | ICD-10-CM | POA: Diagnosis not present

## 2017-06-23 DIAGNOSIS — I129 Hypertensive chronic kidney disease with stage 1 through stage 4 chronic kidney disease, or unspecified chronic kidney disease: Secondary | ICD-10-CM | POA: Diagnosis not present

## 2017-06-23 DIAGNOSIS — N189 Chronic kidney disease, unspecified: Secondary | ICD-10-CM | POA: Diagnosis not present

## 2017-06-23 DIAGNOSIS — I25110 Atherosclerotic heart disease of native coronary artery with unstable angina pectoris: Secondary | ICD-10-CM | POA: Diagnosis not present

## 2017-06-23 DIAGNOSIS — I252 Old myocardial infarction: Secondary | ICD-10-CM | POA: Diagnosis not present

## 2017-06-23 DIAGNOSIS — J449 Chronic obstructive pulmonary disease, unspecified: Secondary | ICD-10-CM | POA: Diagnosis not present

## 2017-06-28 DIAGNOSIS — M48062 Spinal stenosis, lumbar region with neurogenic claudication: Secondary | ICD-10-CM | POA: Diagnosis not present

## 2017-07-01 ENCOUNTER — Other Ambulatory Visit: Payer: Self-pay | Admitting: Internal Medicine

## 2017-07-16 ENCOUNTER — Ambulatory Visit: Payer: Medicare Other | Admitting: Internal Medicine

## 2017-07-20 DIAGNOSIS — M48062 Spinal stenosis, lumbar region with neurogenic claudication: Secondary | ICD-10-CM | POA: Diagnosis not present

## 2017-07-26 ENCOUNTER — Other Ambulatory Visit: Payer: Self-pay | Admitting: Internal Medicine

## 2017-08-23 ENCOUNTER — Ambulatory Visit: Payer: Medicare Other | Admitting: Internal Medicine

## 2017-08-26 ENCOUNTER — Other Ambulatory Visit: Payer: Self-pay | Admitting: Internal Medicine

## 2017-08-26 DIAGNOSIS — M47816 Spondylosis without myelopathy or radiculopathy, lumbar region: Secondary | ICD-10-CM | POA: Diagnosis not present

## 2017-09-07 ENCOUNTER — Telehealth: Payer: Self-pay | Admitting: Internal Medicine

## 2017-09-07 NOTE — Telephone Encounter (Signed)
I called the patient to schedule AWV-S, but there was no answer and no option to leave a message. VDM (DD)

## 2017-09-13 ENCOUNTER — Other Ambulatory Visit: Payer: Self-pay | Admitting: Internal Medicine

## 2017-10-11 DIAGNOSIS — E0849 Diabetes mellitus due to underlying condition with other diabetic neurological complication: Secondary | ICD-10-CM | POA: Diagnosis not present

## 2017-10-11 DIAGNOSIS — H5461 Unqualified visual loss, right eye, normal vision left eye: Secondary | ICD-10-CM | POA: Diagnosis not present

## 2017-10-11 DIAGNOSIS — Z23 Encounter for immunization: Secondary | ICD-10-CM | POA: Diagnosis not present

## 2017-10-11 DIAGNOSIS — I509 Heart failure, unspecified: Secondary | ICD-10-CM | POA: Diagnosis not present

## 2017-10-11 DIAGNOSIS — I10 Essential (primary) hypertension: Secondary | ICD-10-CM | POA: Diagnosis not present

## 2017-10-11 DIAGNOSIS — Z125 Encounter for screening for malignant neoplasm of prostate: Secondary | ICD-10-CM | POA: Diagnosis not present

## 2017-10-11 DIAGNOSIS — H5460 Unqualified visual loss, one eye, unspecified: Secondary | ICD-10-CM | POA: Diagnosis not present

## 2017-10-12 DIAGNOSIS — M47816 Spondylosis without myelopathy or radiculopathy, lumbar region: Secondary | ICD-10-CM | POA: Diagnosis not present

## 2017-10-21 ENCOUNTER — Other Ambulatory Visit: Payer: Self-pay | Admitting: Internal Medicine

## 2017-10-24 DIAGNOSIS — Z1211 Encounter for screening for malignant neoplasm of colon: Secondary | ICD-10-CM | POA: Diagnosis not present

## 2017-10-24 DIAGNOSIS — Z1212 Encounter for screening for malignant neoplasm of rectum: Secondary | ICD-10-CM | POA: Diagnosis not present

## 2017-10-25 DIAGNOSIS — Z7289 Other problems related to lifestyle: Secondary | ICD-10-CM | POA: Diagnosis not present

## 2017-10-27 ENCOUNTER — Other Ambulatory Visit: Payer: Self-pay | Admitting: Family Medicine

## 2017-10-27 ENCOUNTER — Other Ambulatory Visit (HOSPITAL_COMMUNITY)
Admission: RE | Admit: 2017-10-27 | Discharge: 2017-10-27 | Disposition: A | Discharge status: Home / Self Care | Payer: Medicare Other | Source: Ambulatory Visit | Attending: Family Medicine | Admitting: Family Medicine

## 2017-10-27 DIAGNOSIS — Z113 Encounter for screening for infections with a predominantly sexual mode of transmission: Secondary | ICD-10-CM | POA: Diagnosis present

## 2017-11-03 LAB — URINE CYTOLOGY ANCILLARY ONLY
BACTERIAL VAGINITIS: NEGATIVE
Candida vaginitis: NEGATIVE
Chlamydia: NEGATIVE
Neisseria Gonorrhea: NEGATIVE
Trichomonas: NEGATIVE

## 2017-11-08 DIAGNOSIS — M48062 Spinal stenosis, lumbar region with neurogenic claudication: Secondary | ICD-10-CM | POA: Diagnosis not present

## 2017-11-11 DIAGNOSIS — M5416 Radiculopathy, lumbar region: Secondary | ICD-10-CM | POA: Diagnosis not present

## 2017-11-26 DIAGNOSIS — E118 Type 2 diabetes mellitus with unspecified complications: Secondary | ICD-10-CM | POA: Diagnosis not present

## 2017-11-26 DIAGNOSIS — I10 Essential (primary) hypertension: Secondary | ICD-10-CM | POA: Diagnosis not present

## 2017-12-02 ENCOUNTER — Ambulatory Visit: Payer: Medicare Other | Admitting: Internal Medicine

## 2017-12-09 ENCOUNTER — Other Ambulatory Visit: Payer: Self-pay | Admitting: Internal Medicine

## 2017-12-09 DIAGNOSIS — E1142 Type 2 diabetes mellitus with diabetic polyneuropathy: Secondary | ICD-10-CM

## 2017-12-09 DIAGNOSIS — H547 Unspecified visual loss: Secondary | ICD-10-CM

## 2017-12-09 DIAGNOSIS — E1165 Type 2 diabetes mellitus with hyperglycemia: Principal | ICD-10-CM

## 2017-12-09 DIAGNOSIS — IMO0002 Reserved for concepts with insufficient information to code with codable children: Secondary | ICD-10-CM

## 2017-12-09 DIAGNOSIS — Z794 Long term (current) use of insulin: Principal | ICD-10-CM

## 2017-12-16 ENCOUNTER — Other Ambulatory Visit: Payer: Self-pay | Admitting: *Deleted

## 2017-12-16 ENCOUNTER — Other Ambulatory Visit: Payer: Self-pay | Admitting: Internal Medicine

## 2017-12-16 DIAGNOSIS — I10 Essential (primary) hypertension: Secondary | ICD-10-CM

## 2017-12-16 MED ORDER — LISINOPRIL 20 MG PO TABS: 20.0000 mg | ORAL_TABLET | Freq: Every day | ORAL | 0 refills | Status: DC

## 2017-12-16 MED ORDER — SERTRALINE HCL 50 MG PO TABS: ORAL_TABLET | ORAL | 0 refills | Status: DC

## 2017-12-16 MED ORDER — LINAGLIPTIN 5 MG PO TABS: ORAL_TABLET | ORAL | 0 refills | Status: DC

## 2017-12-16 NOTE — Telephone Encounter (Signed)
[employer]  Noted patient needs an appointment before anymore refills.

## 2017-12-21 ENCOUNTER — Ambulatory Visit: Payer: Medicare Other | Admitting: Sports Medicine

## 2017-12-21 ENCOUNTER — Ambulatory Visit: Payer: Medicare Other

## 2017-12-21 DIAGNOSIS — M533 Sacrococcygeal disorders, not elsewhere classified: Secondary | ICD-10-CM | POA: Diagnosis not present

## 2017-12-23 ENCOUNTER — Telehealth: Payer: Self-pay

## 2017-12-23 DIAGNOSIS — I25118 Atherosclerotic heart disease of native coronary artery with other forms of angina pectoris: Secondary | ICD-10-CM | POA: Diagnosis not present

## 2017-12-23 DIAGNOSIS — I252 Old myocardial infarction: Secondary | ICD-10-CM | POA: Diagnosis not present

## 2017-12-23 DIAGNOSIS — N189 Chronic kidney disease, unspecified: Secondary | ICD-10-CM | POA: Diagnosis not present

## 2017-12-23 DIAGNOSIS — I129 Hypertensive chronic kidney disease with stage 1 through stage 4 chronic kidney disease, or unspecified chronic kidney disease: Secondary | ICD-10-CM | POA: Diagnosis not present

## 2017-12-23 DIAGNOSIS — E1122 Type 2 diabetes mellitus with diabetic chronic kidney disease: Secondary | ICD-10-CM | POA: Diagnosis not present

## 2017-12-23 NOTE — Telephone Encounter (Signed)
Called patient to try to schedule AWV in the office. No answer-left voicemail to call back.    

## 2017-12-27 ENCOUNTER — Telehealth: Payer: Self-pay

## 2017-12-27 NOTE — Telephone Encounter (Signed)
Called patient to try to schedule AWV in the office. No answer-left voicemail to call back.    

## 2017-12-30 ENCOUNTER — Encounter: Payer: Medicare Other | Attending: Psychology | Admitting: Psychology

## 2017-12-30 DIAGNOSIS — Z833 Family history of diabetes mellitus: Secondary | ICD-10-CM | POA: Diagnosis not present

## 2017-12-30 DIAGNOSIS — R410 Disorientation, unspecified: Secondary | ICD-10-CM | POA: Diagnosis present

## 2017-12-30 DIAGNOSIS — Z809 Family history of malignant neoplasm, unspecified: Secondary | ICD-10-CM | POA: Diagnosis not present

## 2017-12-30 DIAGNOSIS — I10 Essential (primary) hypertension: Secondary | ICD-10-CM | POA: Diagnosis not present

## 2017-12-30 DIAGNOSIS — F331 Major depressive disorder, recurrent, moderate: Secondary | ICD-10-CM

## 2017-12-30 DIAGNOSIS — M48062 Spinal stenosis, lumbar region with neurogenic claudication: Secondary | ICD-10-CM | POA: Diagnosis not present

## 2017-12-30 DIAGNOSIS — R413 Other amnesia: Secondary | ICD-10-CM

## 2017-12-30 DIAGNOSIS — F419 Anxiety disorder, unspecified: Secondary | ICD-10-CM | POA: Diagnosis not present

## 2017-12-30 DIAGNOSIS — F339 Major depressive disorder, recurrent, unspecified: Secondary | ICD-10-CM | POA: Diagnosis not present

## 2017-12-30 DIAGNOSIS — E119 Type 2 diabetes mellitus without complications: Secondary | ICD-10-CM | POA: Insufficient documentation

## 2018-01-07 ENCOUNTER — Telehealth: Payer: Self-pay | Admitting: Sports Medicine

## 2018-01-17 ENCOUNTER — Other Ambulatory Visit: Payer: Self-pay | Admitting: Internal Medicine

## 2018-01-17 DIAGNOSIS — I10 Essential (primary) hypertension: Secondary | ICD-10-CM

## 2018-01-17 NOTE — Telephone Encounter (Signed)
rx sent to pharmacy by e-script Spoke with patient and advised results, also advised that pt needs an appt, last seen 04/2017. Pt stated he will call back and schedule once he finds transportation.

## 2018-01-23 ENCOUNTER — Encounter: Payer: Self-pay | Admitting: Psychology

## 2018-01-23 NOTE — Progress Notes (Signed)
Neuropsychological Consultation   Patient:   Eddie Graves   DOB:   [birth year]  MR Number:  [id number]  Location:  [facility] PHYSICAL MEDICINE AND REHABILITATION [address] [place] [id number] [place] [address] Dept: [phone]           Date of Service:   12/30/2017  Start Time:   10 AM End Time:   11 AM  Provider/Observer:  Arley Phenix, Psy.D.       Clinical Neuropsychologist       Billing Code/Service: Neurobehavioral status exam  Chief Complaint:    Joseeduardo Brix Junior was referred by Dr. Parke Simmers for neuropsychological consultation due to increasing difficulties with being disoriented the patient has a long history of depression and anxiety with numerous medical issues including diabetes, COPD, memory loss, spinal stenosis, legally blind, and a small subarachnoid hemorrhage possible seizure disorder in the past.  Reason for Service:  [name] Junior was referred for neuropsychological consultation due to increasing disorientation and memory loss.  Patient reports that he started having increasing difficulties over the past 5 years with memory.  Some of these memory issues have to do with recalling memories from his past life that are very stressful and that he does not want to remember.  The patient reports that he had been very self-sufficient in the past is legally blind and so all of his life.  He first developed blurred vision when he was 84 years old is legally blind the patient reports that he has been remembering things past that are disturbing to him but also having trouble learning new information.  The patient's son reports that he is often disoriented.  The patient's son reports that when he goes and takes a nap during the day that he will wake confused as to what time it is or what day it is.  This often results in him not taking his medications right or taking his medications more frequently  than prescribed.  The patient does have well manage diabetes significant coronary artery disease and has had 3 or 4 stents placed.  Current Status:  Patient describes moderate to significant symptoms of depression, anxiety, mood changes, sleep disturbance, racing thoughts, insomnia, confusion, memory problems, loss of interest, agitation, excessive worrying, and poor concentration.  The patient reports that he has had increasing difficulties with memory over the past 5 years.  The patient also is having intrusive thoughts about prior events years ago.  Reliability of Information: The information is provided by 1 hour face-to-face clinical interview with the patient and his son as well as review of available medical records.  Behavioral Observation: Tadao Emig  presents as a 84 y.o.-year-old Right African American Male who appeared his stated age. his dress was Appropriate and he was Well Groomed and his manners were Appropriate to the situation.  his participation was indicative of Appropriate behaviors.  There were  physical disabilities noted related to blindness.  he displayed an appropriate level of cooperation and motivation.     Interactions:    Active Appropriate  Attention:   abnormal and attention span appeared shorter than expected for age  Memory:   abnormal; global memory impairment noted  Visuo-spatial:  not examined  Speech (Volume):  low  Speech:   normal; normal  Thought Process:  Coherent and Relevant  Though Content:  WNL; not suicidal and not homicidal  Orientation:   person, place and situation  Judgment:  Poor  Planning:   Poor  Affect:    Depressed  Mood:    Dysphoric  Insight:   Fair  Intelligence:   normal  Medical History:   Past Medical History:  Diagnosis Date  . Anxiety state, unspecified   . Blind   . Chronic airway obstruction, not elsewhere classified   . Coronary atherosclerosis of native coronary artery   . Cough   . Depressive  disorder, not elsewhere classified   . DM (diabetes mellitus) type II controlled peripheral vascular disorder   . Essential hypertension, benign   . H/O hiatal hernia   . Headache(784.0)   . Legal blindness, as defined in [place]   . Memory loss   . Other and unspecified hyperlipidemia   . Pain in joint, site unspecified   . Palpitations   . Rash and other nonspecific skin eruption   . Reflux esophagitis   . Sebaceous cyst   . Spinal stenosis, lumbar region, with neurogenic claudication   . Type II diabetes mellitus with neurological manifestations, uncontrolled (HCC)   . Unspecified constipation   . Vitamin D deficiency        Abuse/Trauma History: While the patient did not go into details clear that there were some significant past life experiences that have been traumatic to him.  The patient reports that as his recent memory has become more problematic he has had more intrusive thoughts related to past experiences that are very upsetting to him.  Psychiatric History:  The patient has a past history of major depressive disorder without psychotic features.  Family Med/Psych History:  Family History  Problem Relation Age of Onset  . Asthma Mother   . Diabetes Son   . Cancer Daughter   . Diabetes Son     Risk of Suicide/Violence: low the patient denies any suicidal or homicidal ideation.  Impression/DX:  [name] Junior was referred for neuropsychological consultation due to increasing disorientation and memory loss.  Patient reports that he started having increasing difficulties over the past 5 years with memory.  Some of these memory issues have to do with recalling memories from his past life that are very stressful and that he does not want to remember.  The patient reports that he had been very self-sufficient in the past is legally blind and so all of his life.  He first developed blurred vision when he was 84 years old is legally blind the patient reports that he has been  remembering things past that are disturbing to him but also having trouble learning new information.  The patient's son reports that he is often disoriented.  The patient's son reports that when he goes and takes a nap during the day that he will wake confused as to what time it is or what day it is.  This often results in him not taking his medications right or taking his medications more frequently than prescribed.  The patient does have well manage diabetes significant coronary artery disease and has had 3 or 4 stents placed.  Disposition/Plan:  We have set the patient up for formal neuropsychological testing utilizing the [facility] as well as selective other neuropsychological testing just for issues related to his blindness.  Diagnosis:    Memory loss  Moderate episode of recurrent major depressive disorder (HCC)         Electronically Signed   _______________________ Arley Phenix, Psy.D.

## 2018-02-01 ENCOUNTER — Ambulatory Visit (INDEPENDENT_AMBULATORY_CARE_PROVIDER_SITE_OTHER): Payer: Medicare Other | Admitting: Sports Medicine

## 2018-02-01 ENCOUNTER — Encounter: Payer: Self-pay | Admitting: Sports Medicine

## 2018-02-01 ENCOUNTER — Other Ambulatory Visit: Payer: Self-pay | Admitting: Internal Medicine

## 2018-02-01 ENCOUNTER — Ambulatory Visit: Payer: Medicare Other

## 2018-02-01 DIAGNOSIS — M79671 Pain in right foot: Secondary | ICD-10-CM | POA: Diagnosis not present

## 2018-02-01 DIAGNOSIS — E1142 Type 2 diabetes mellitus with diabetic polyneuropathy: Secondary | ICD-10-CM | POA: Diagnosis not present

## 2018-02-01 DIAGNOSIS — M2141 Flat foot [pes planus] (acquired), right foot: Secondary | ICD-10-CM

## 2018-02-01 DIAGNOSIS — M79672 Pain in left foot: Secondary | ICD-10-CM | POA: Diagnosis not present

## 2018-02-01 DIAGNOSIS — E1165 Type 2 diabetes mellitus with hyperglycemia: Principal | ICD-10-CM

## 2018-02-01 DIAGNOSIS — M2142 Flat foot [pes planus] (acquired), left foot: Secondary | ICD-10-CM

## 2018-02-01 DIAGNOSIS — B351 Tinea unguium: Secondary | ICD-10-CM | POA: Diagnosis not present

## 2018-02-01 DIAGNOSIS — M21619 Bunion of unspecified foot: Secondary | ICD-10-CM

## 2018-02-01 DIAGNOSIS — IMO0002 Reserved for concepts with insufficient information to code with codable children: Secondary | ICD-10-CM

## 2018-02-01 DIAGNOSIS — Z794 Long term (current) use of insulin: Principal | ICD-10-CM

## 2018-02-01 DIAGNOSIS — H547 Unspecified visual loss: Secondary | ICD-10-CM

## 2018-02-01 NOTE — Progress Notes (Signed)
Subjective: Eddie Graves is a 84 y.o. male patient with history of diabetes who returns to office today complaining of long, painful nails  while ambulating in shoes; unable to trim. Patient is blind. Patient states that the glucose reading this morning was not recorded, a1c 6.4.   Patient desires new diabetic shoes. States that he never got shoes last year.   Patient Active Problem List   Diagnosis Date Noted  . Acute non Q wave myocardial infarction (HCC) 12/12/2016  . Uncontrolled type 2 diabetes mellitus with diabetic polyneuropathy, with long-term current use of insulin (HCC) 03/05/2016  . Tobacco abuse counseling 03/05/2016  . Right sided abdominal pain 03/05/2016  . Constipation 03/05/2016  . Chest pain at rest 11/06/2015  . Diastolic heart failure (HCC) 11/06/2015  . Acute bronchitis with chronic obstructive pulmonary disease (COPD) (HCC) 11/06/2015  . Neurological deficit, transient 11/06/2015  . Left facial numbness   . AKI (acute kidney injury) (HCC)   . Chest pain 05/19/2015  . History of gonorrhea 05/19/2015  . Hypotension 05/19/2015  . CKD (chronic kidney disease), stage III (HCC) 05/19/2015  . Involuntary muscle contractions 10/12/2014  . Seizure disorder after [facility] 08/19/2014  . Diabetic peripheral neuropathy associated with type 2 diabetes mellitus (HCC) 07/26/2014  . Blind 07/26/2014  . Tobacco abuse 04/26/2014  . Obesity (BMI 30-39.9) 08/18/2013  . COPD exacerbation (HCC) 08/06/2013  . SAH (subarachnoid hemorrhage) (HCC) 05/30/2013  . Coronary atherosclerosis of native coronary artery   . Vitamin D deficiency   . Memory loss   . COPD (chronic obstructive pulmonary disease) (HCC) 03/16/2013  . Essential hypertension, benign 03/16/2013  . Depressive disorder, not elsewhere classified 03/16/2013  . Spinal stenosis, lumbar region, with neurogenic claudication 03/16/2013   Current Outpatient Medications on File Prior to Visit  Medication Sig Dispense Refill   . acetaminophen (TYLENOL) 500 MG tablet Take 1 tablet (500 mg total) by mouth 3 (three) times daily as needed for moderate pain. 90 tablet 3  . amLODipine (NORVASC) 5 MG tablet Take 5 mg by mouth daily.    Marland Kitchen aspirin EC 81 MG tablet 1/2 tablet by mouth daily per Cardiologist 15 tablet 11  . Cholecalciferol (VITAMIN D3) 2000 units capsule TAKE 1 CAPSULE (2,000 UNITS TOTAL) BY MOUTH DAILY. FOR VITAMIN DAY REPLETION 30 capsule 0  . clopidogrel (PLAVIX) 75 MG tablet Take 1 tablet (75 mg total) by mouth daily with breakfast. 30 tablet 3  . CRESTOR 20 MG tablet Take 20 mg by mouth daily. For cholesterol    . esomeprazole (NEXIUM) 40 MG capsule TAKE ONE CAPSULE BY MOUTH ONCE DAILY BEFORE BREAKFAST FOR STOMACH 30 capsule 0  . GARLIC PO Take 1,696 mcg by mouth daily.     Marland Kitchen glucose blood (PRODIGY NO CODING BLOOD GLUC) test strip PRODIGY TEST STRIPS, CHECK BLOOD SUGAR TWICE DAILY DX: E11.65, H54.7 PATIENT WITH BLINDNESS AND NEEDS STRIPS WITH READ ALOUD MONITOR 100 each 12  . Insulin Lispro Prot & Lispro ([name] 75/25 KWIKPEN) (75-25) 100 UNIT/ML Kwikpen Inject 40 Units into the skin 2 (two) times daily with a meal. 15 mL 11  . levalbuterol (XOPENEX) 1.25 MG/3ML nebulizer solution TAKE 1.25 MG (ONE AMPULE) BY NEBULIZATION EVERY 4 (FOUR) HOURS AS NEEDED FOR WHEEZING. 72 mL 0  . levETIRAcetam (KEPPRA XR) 500 MG 24 hr tablet TAKE TWO   TABLETS BY MOUTH   DAILY 180 tablet 2  . lipase/protease/amylase (CREON) 36000 UNITS CPEP capsule Two capsules twice a day with meals and one capsule  with snack 24 capsule 0  . lisinopril (PRINIVIL,ZESTRIL) 20 MG tablet TAKE 1 TABLET (20 MG TOTAL) BY MOUTH DAILY 30 tablet 0  . metoprolol succinate (TOPROL-XL) 100 MG 24 hr tablet Take one tablet by mouth once daily for blood pressure 90 tablet 3  . mometasone-formoterol (DULERA) 200-5 MCG/ACT AERO Inhale 2 puffs into the lungs 2 (two) times daily. 2 Inhaler 3  . nitroGLYCERIN (NITROSTAT) 0.4 MG SL tablet Place 1 tablet (0.4 mg  total) under the tongue every 5 (five) minutes as needed for chest pain. 25 tablet 12  . Omega 3 1200 MG CAPS Take 2,400 mg by mouth daily. For cholesterol    . PROAIR HFA 108 (90 Base) MCG/ACT inhaler INHALE TWO PUFFS INTO THE LUNGS EVERY TWO HOURS IF NEEDED 18 g 0  . sertraline (ZOLOFT) 50 MG tablet TAKE ONE TABLET BY MOUTH ONCE DAILY 30 tablet 0  . tiotropium (SPIRIVA) 18 MCG inhalation capsule Place 1 capsule (18 mcg total) into inhaler and inhale daily. 30 capsule 12  . TRADJENTA 5 MG TABS tablet TAKE ONE TABLET BY MOUTH ONCE DAILY TO CONTROL BLOOD SUGAR 30 tablet 0   No current facility-administered medications on file prior to visit.    Allergies  Allergen Reactions  . Flexeril [Cyclobenzaprine] Other (See Comments)    delirium  . Nsaids Other (See Comments)    Renal failure    No results found for this or any previous visit (from the past 2160 hour(s)).  Objective: General: Patient is awake, alert, and oriented x 3 and in no acute distress.  Integument: Skin is warm, dry and supple bilateral. Nails are tender, long, thickened and dystrophic with subungual debris, consistent with onychomycosis, 1-5 bilateral. No signs of infection. No open lesions or preulcerative lesions present bilateral. History of callus at heels remains resolved. Remaining integument unremarkable.  Vasculature:  Dorsalis Pedis pulse 1/4 bilateral. Posterior Tibial pulse  1/4 bilateral. Capillary fill time <3 sec 1-5 bilateral. Scant hair growth to the level of the digits.Temperature gradient within normal limits. No varicosities present bilateral. No edema present bilateral.   Neurology: The patient has intact sensation measured with a 5.07/10g Semmes Weinstein Monofilament at all pedal sites bilateral. Vibratory sensation diminished bilateral with tuning fork. No Babinski sign present bilateral.   Musculoskeletal: Asymptomatic bunion and pes planus pedal deformities noted bilateral. Muscular strength 5/5 in  all lower extremity muscular groups bilateral without pain on range of motion. No tenderness with calf compression bilateral.  Assessment and Plan: Problem List Items Addressed This Visit    None    Visit Diagnoses    Dermatophytosis of nail    -  Primary   Foot pain, bilateral       Diabetic polyneuropathy associated with type 2 diabetes mellitus (HCC)       Bunion       Pes planus of both feet         -Examined patient. -Discussed and educated patient on diabetic foot care, especially with regards to the vascular, neurological and musculoskeletal systems.  -Stressed the importance of good glycemic control and the detriment of not controlling glucose levels in relation to the foot. -Mechanically debrided all nails 1-5 bilateral using sterile nail nipper and filed with dremel without incident  -Safe step diabetic shoe order form was completed; Patient was measured today by [facility] Office to contact primary care for approval / certification. -Answered all patient questions -Patient to return  in 3 months for at risk foot care -Patient  advised to call the office if any problems or questions arise in the meantime.  Landis Martins, DPM

## 2018-02-10 ENCOUNTER — Other Ambulatory Visit: Payer: Self-pay | Admitting: Internal Medicine

## 2018-02-10 DIAGNOSIS — I10 Essential (primary) hypertension: Secondary | ICD-10-CM

## 2018-02-15 ENCOUNTER — Other Ambulatory Visit: Payer: Self-pay | Admitting: Internal Medicine

## 2018-02-15 DIAGNOSIS — I10 Essential (primary) hypertension: Secondary | ICD-10-CM

## 2018-02-25 DIAGNOSIS — I10 Essential (primary) hypertension: Secondary | ICD-10-CM | POA: Diagnosis not present

## 2018-02-25 DIAGNOSIS — E0849 Diabetes mellitus due to underlying condition with other diabetic neurological complication: Secondary | ICD-10-CM | POA: Diagnosis not present

## 2018-02-25 DIAGNOSIS — M4808 Spinal stenosis, sacral and sacrococcygeal region: Secondary | ICD-10-CM | POA: Diagnosis not present

## 2018-02-25 DIAGNOSIS — K580 Irritable bowel syndrome with diarrhea: Secondary | ICD-10-CM | POA: Diagnosis not present

## 2018-02-25 DIAGNOSIS — Z0000 Encounter for general adult medical examination without abnormal findings: Secondary | ICD-10-CM | POA: Diagnosis not present

## 2018-03-01 ENCOUNTER — Other Ambulatory Visit: Payer: Self-pay | Admitting: Internal Medicine

## 2018-03-01 ENCOUNTER — Encounter: Payer: Medicare Other | Attending: Psychology | Admitting: Psychology

## 2018-03-01 DIAGNOSIS — M48062 Spinal stenosis, lumbar region with neurogenic claudication: Secondary | ICD-10-CM | POA: Insufficient documentation

## 2018-03-01 DIAGNOSIS — E119 Type 2 diabetes mellitus without complications: Secondary | ICD-10-CM | POA: Insufficient documentation

## 2018-03-01 DIAGNOSIS — R413 Other amnesia: Secondary | ICD-10-CM | POA: Diagnosis not present

## 2018-03-01 DIAGNOSIS — I10 Essential (primary) hypertension: Secondary | ICD-10-CM | POA: Diagnosis not present

## 2018-03-01 DIAGNOSIS — F331 Major depressive disorder, recurrent, moderate: Secondary | ICD-10-CM | POA: Diagnosis not present

## 2018-03-01 DIAGNOSIS — R410 Disorientation, unspecified: Secondary | ICD-10-CM | POA: Diagnosis present

## 2018-03-01 DIAGNOSIS — F339 Major depressive disorder, recurrent, unspecified: Secondary | ICD-10-CM | POA: Diagnosis not present

## 2018-03-01 DIAGNOSIS — Z809 Family history of malignant neoplasm, unspecified: Secondary | ICD-10-CM | POA: Insufficient documentation

## 2018-03-01 DIAGNOSIS — F419 Anxiety disorder, unspecified: Secondary | ICD-10-CM | POA: Diagnosis not present

## 2018-03-01 DIAGNOSIS — Z833 Family history of diabetes mellitus: Secondary | ICD-10-CM | POA: Diagnosis not present

## 2018-03-03 ENCOUNTER — Encounter: Payer: Self-pay | Admitting: Psychology

## 2018-03-03 NOTE — Progress Notes (Signed)
Today, the plan had been to conduct neuropsychological testing.  However, when we began the session they were significant issues that were brought up but needed to be addressed during our visit rather than the formal testing.  The patient began to go into great detail regarding the stressors that he has been experiencing recently.  The patient reports that he took care of his wife as her health deteriorated and she is now since passed away.  However, events that go back through his life have begun to haunt him regarding his relationship with his wife and the fact that he cannot talk about these issues with his children or they will get very upset and refused to talk.  The patient has been functionally blind his entire adult life and is now completely blind.  The patient reports that during his marriage that his wife engaged in numerous long-term affairs that he was aware of and that there had been times where his wife would continually try to make him feel like he was incompetent are not actually experiencing the events the way they were playing out and using his vision deficits against him.  However, there were a couple of occasions where there were direct confrontations between the patient and his wife as well as at least one individual that his wife was having an affair with.  The patient reports that these events continue to cause him a great deal of distress and of the primary stressor for him.  He reports that after we talked about these issues during our first visit that he has experienced some improvement in his overall functioning and he feels like the stress and depression from that of the primary issues that he was dealing with.  The patient's cognitive function and memory functions appeared to be quite good during our visit today.  The patient will return and we will make a decision about further therapeutic interventions versus formal neuropsychological testing.  I do think that the primary issue has  been a major depressive disorder.

## 2018-03-07 ENCOUNTER — Other Ambulatory Visit: Payer: Self-pay | Admitting: Internal Medicine

## 2018-03-07 DIAGNOSIS — E1165 Type 2 diabetes mellitus with hyperglycemia: Principal | ICD-10-CM

## 2018-03-07 DIAGNOSIS — IMO0002 Reserved for concepts with insufficient information to code with codable children: Secondary | ICD-10-CM

## 2018-03-07 DIAGNOSIS — E1142 Type 2 diabetes mellitus with diabetic polyneuropathy: Secondary | ICD-10-CM

## 2018-03-07 DIAGNOSIS — Z794 Long term (current) use of insulin: Principal | ICD-10-CM

## 2018-03-07 DIAGNOSIS — H547 Unspecified visual loss: Secondary | ICD-10-CM

## 2018-03-08 ENCOUNTER — Encounter: Payer: Self-pay | Admitting: Psychology

## 2018-03-08 ENCOUNTER — Encounter: Payer: Medicare Other | Attending: Family Medicine | Admitting: Psychology

## 2018-03-08 DIAGNOSIS — F419 Anxiety disorder, unspecified: Secondary | ICD-10-CM | POA: Insufficient documentation

## 2018-03-08 DIAGNOSIS — I10 Essential (primary) hypertension: Secondary | ICD-10-CM | POA: Diagnosis not present

## 2018-03-08 DIAGNOSIS — E119 Type 2 diabetes mellitus without complications: Secondary | ICD-10-CM | POA: Insufficient documentation

## 2018-03-08 DIAGNOSIS — M48062 Spinal stenosis, lumbar region with neurogenic claudication: Secondary | ICD-10-CM | POA: Diagnosis not present

## 2018-03-08 DIAGNOSIS — R413 Other amnesia: Secondary | ICD-10-CM | POA: Diagnosis not present

## 2018-03-08 DIAGNOSIS — F331 Major depressive disorder, recurrent, moderate: Secondary | ICD-10-CM | POA: Diagnosis not present

## 2018-03-08 DIAGNOSIS — Z809 Family history of malignant neoplasm, unspecified: Secondary | ICD-10-CM | POA: Diagnosis not present

## 2018-03-08 DIAGNOSIS — R410 Disorientation, unspecified: Secondary | ICD-10-CM | POA: Diagnosis present

## 2018-03-08 DIAGNOSIS — Z833 Family history of diabetes mellitus: Secondary | ICD-10-CM | POA: Diagnosis not present

## 2018-03-08 NOTE — Progress Notes (Signed)
Today, we continue to work on coping and adjustment issues related to the patient's primary issue at this point related to depression.  The patient continues to talk about some of the significant stressors he had during his marriage and the effects that this is have upon him now.  The patient describes significant infidelity on the part of his wife and the fact that he feels like he was continually taken advantage of because of his blindness.  This tied into early childhood stressors and trauma that were direct result from his legal blindness as a child and now complete blindness now.  The patient reports that he has been doing much better since our initial visits and feels like these discussions and working on coping skills have been very helpful for him.  The patient reports that his children continue to refused to talk about this in any way and is led to some of the stress he has had recently as his kids refused to even help him work through and talk about some of the issues that developed throughout his marriage with his wife and are continuing to have a deleterious effect on them now.  I will see the patient again in approximately 1 month.

## 2018-03-17 ENCOUNTER — Encounter: Payer: Medicare Other | Admitting: Psychology

## 2018-03-18 DIAGNOSIS — E118 Type 2 diabetes mellitus with unspecified complications: Secondary | ICD-10-CM | POA: Diagnosis not present

## 2018-03-18 DIAGNOSIS — M4808 Spinal stenosis, sacral and sacrococcygeal region: Secondary | ICD-10-CM | POA: Diagnosis not present

## 2018-03-18 DIAGNOSIS — I10 Essential (primary) hypertension: Secondary | ICD-10-CM | POA: Diagnosis not present

## 2018-03-18 DIAGNOSIS — K580 Irritable bowel syndrome with diarrhea: Secondary | ICD-10-CM | POA: Diagnosis not present

## 2018-03-19 ENCOUNTER — Other Ambulatory Visit: Payer: Self-pay | Admitting: Internal Medicine

## 2018-03-22 ENCOUNTER — Telehealth: Payer: Self-pay

## 2018-03-22 NOTE — Telephone Encounter (Signed)
I called the pt to schedule an appt.  His son, [name], told me that the patient has switched to a new PCP. VDM (DD)

## 2018-03-23 ENCOUNTER — Other Ambulatory Visit: Payer: Self-pay | Admitting: Internal Medicine

## 2018-03-28 ENCOUNTER — Other Ambulatory Visit: Payer: Self-pay | Admitting: Internal Medicine

## 2018-03-28 DIAGNOSIS — E118 Type 2 diabetes mellitus with unspecified complications: Secondary | ICD-10-CM | POA: Diagnosis not present

## 2018-03-28 DIAGNOSIS — T383X5A Adverse effect of insulin and oral hypoglycemic [antidiabetic] drugs, initial encounter: Secondary | ICD-10-CM | POA: Diagnosis not present

## 2018-03-31 ENCOUNTER — Encounter: Payer: Medicare Other | Admitting: Psychology

## 2018-04-06 DIAGNOSIS — I1310 Hypertensive heart and chronic kidney disease without heart failure, with stage 1 through stage 4 chronic kidney disease, or unspecified chronic kidney disease: Secondary | ICD-10-CM | POA: Diagnosis not present

## 2018-04-06 DIAGNOSIS — Z7984 Long term (current) use of oral hypoglycemic drugs: Secondary | ICD-10-CM | POA: Diagnosis not present

## 2018-04-06 DIAGNOSIS — J209 Acute bronchitis, unspecified: Secondary | ICD-10-CM | POA: Diagnosis not present

## 2018-04-06 DIAGNOSIS — M069 Rheumatoid arthritis, unspecified: Secondary | ICD-10-CM | POA: Diagnosis not present

## 2018-04-06 DIAGNOSIS — H548 Legal blindness, as defined in USA: Secondary | ICD-10-CM | POA: Diagnosis not present

## 2018-04-06 DIAGNOSIS — J45909 Unspecified asthma, uncomplicated: Secondary | ICD-10-CM | POA: Diagnosis not present

## 2018-04-06 DIAGNOSIS — E1122 Type 2 diabetes mellitus with diabetic chronic kidney disease: Secondary | ICD-10-CM | POA: Diagnosis not present

## 2018-04-06 DIAGNOSIS — Z7951 Long term (current) use of inhaled steroids: Secondary | ICD-10-CM | POA: Diagnosis not present

## 2018-04-06 DIAGNOSIS — N189 Chronic kidney disease, unspecified: Secondary | ICD-10-CM | POA: Diagnosis not present

## 2018-04-06 DIAGNOSIS — D631 Anemia in chronic kidney disease: Secondary | ICD-10-CM | POA: Diagnosis not present

## 2018-04-08 DIAGNOSIS — E1165 Type 2 diabetes mellitus with hyperglycemia: Secondary | ICD-10-CM | POA: Diagnosis not present

## 2018-04-11 DIAGNOSIS — N189 Chronic kidney disease, unspecified: Secondary | ICD-10-CM | POA: Diagnosis not present

## 2018-04-11 DIAGNOSIS — H548 Legal blindness, as defined in USA: Secondary | ICD-10-CM | POA: Diagnosis not present

## 2018-04-11 DIAGNOSIS — Z7951 Long term (current) use of inhaled steroids: Secondary | ICD-10-CM | POA: Diagnosis not present

## 2018-04-11 DIAGNOSIS — D631 Anemia in chronic kidney disease: Secondary | ICD-10-CM | POA: Diagnosis not present

## 2018-04-11 DIAGNOSIS — J45909 Unspecified asthma, uncomplicated: Secondary | ICD-10-CM | POA: Diagnosis not present

## 2018-04-11 DIAGNOSIS — J209 Acute bronchitis, unspecified: Secondary | ICD-10-CM | POA: Diagnosis not present

## 2018-04-11 DIAGNOSIS — E1122 Type 2 diabetes mellitus with diabetic chronic kidney disease: Secondary | ICD-10-CM | POA: Diagnosis not present

## 2018-04-11 DIAGNOSIS — I1310 Hypertensive heart and chronic kidney disease without heart failure, with stage 1 through stage 4 chronic kidney disease, or unspecified chronic kidney disease: Secondary | ICD-10-CM | POA: Diagnosis not present

## 2018-04-11 DIAGNOSIS — Z7984 Long term (current) use of oral hypoglycemic drugs: Secondary | ICD-10-CM | POA: Diagnosis not present

## 2018-04-11 DIAGNOSIS — M069 Rheumatoid arthritis, unspecified: Secondary | ICD-10-CM | POA: Diagnosis not present

## 2018-04-14 DIAGNOSIS — Z7951 Long term (current) use of inhaled steroids: Secondary | ICD-10-CM | POA: Diagnosis not present

## 2018-04-14 DIAGNOSIS — J45909 Unspecified asthma, uncomplicated: Secondary | ICD-10-CM | POA: Diagnosis not present

## 2018-04-14 DIAGNOSIS — H548 Legal blindness, as defined in USA: Secondary | ICD-10-CM | POA: Diagnosis not present

## 2018-04-14 DIAGNOSIS — Z7984 Long term (current) use of oral hypoglycemic drugs: Secondary | ICD-10-CM | POA: Diagnosis not present

## 2018-04-14 DIAGNOSIS — J209 Acute bronchitis, unspecified: Secondary | ICD-10-CM | POA: Diagnosis not present

## 2018-04-14 DIAGNOSIS — N189 Chronic kidney disease, unspecified: Secondary | ICD-10-CM | POA: Diagnosis not present

## 2018-04-14 DIAGNOSIS — D631 Anemia in chronic kidney disease: Secondary | ICD-10-CM | POA: Diagnosis not present

## 2018-04-14 DIAGNOSIS — E1122 Type 2 diabetes mellitus with diabetic chronic kidney disease: Secondary | ICD-10-CM | POA: Diagnosis not present

## 2018-04-14 DIAGNOSIS — M069 Rheumatoid arthritis, unspecified: Secondary | ICD-10-CM | POA: Diagnosis not present

## 2018-04-14 DIAGNOSIS — I1310 Hypertensive heart and chronic kidney disease without heart failure, with stage 1 through stage 4 chronic kidney disease, or unspecified chronic kidney disease: Secondary | ICD-10-CM | POA: Diagnosis not present

## 2018-04-18 DIAGNOSIS — D631 Anemia in chronic kidney disease: Secondary | ICD-10-CM | POA: Diagnosis not present

## 2018-04-18 DIAGNOSIS — J209 Acute bronchitis, unspecified: Secondary | ICD-10-CM | POA: Diagnosis not present

## 2018-04-18 DIAGNOSIS — Z7951 Long term (current) use of inhaled steroids: Secondary | ICD-10-CM | POA: Diagnosis not present

## 2018-04-18 DIAGNOSIS — H548 Legal blindness, as defined in USA: Secondary | ICD-10-CM | POA: Diagnosis not present

## 2018-04-18 DIAGNOSIS — E1122 Type 2 diabetes mellitus with diabetic chronic kidney disease: Secondary | ICD-10-CM | POA: Diagnosis not present

## 2018-04-18 DIAGNOSIS — M069 Rheumatoid arthritis, unspecified: Secondary | ICD-10-CM | POA: Diagnosis not present

## 2018-04-18 DIAGNOSIS — J45909 Unspecified asthma, uncomplicated: Secondary | ICD-10-CM | POA: Diagnosis not present

## 2018-04-18 DIAGNOSIS — I1310 Hypertensive heart and chronic kidney disease without heart failure, with stage 1 through stage 4 chronic kidney disease, or unspecified chronic kidney disease: Secondary | ICD-10-CM | POA: Diagnosis not present

## 2018-04-18 DIAGNOSIS — N189 Chronic kidney disease, unspecified: Secondary | ICD-10-CM | POA: Diagnosis not present

## 2018-04-18 DIAGNOSIS — Z7984 Long term (current) use of oral hypoglycemic drugs: Secondary | ICD-10-CM | POA: Diagnosis not present

## 2018-04-19 DIAGNOSIS — Z7984 Long term (current) use of oral hypoglycemic drugs: Secondary | ICD-10-CM | POA: Diagnosis not present

## 2018-04-19 DIAGNOSIS — D631 Anemia in chronic kidney disease: Secondary | ICD-10-CM | POA: Diagnosis not present

## 2018-04-19 DIAGNOSIS — Z7951 Long term (current) use of inhaled steroids: Secondary | ICD-10-CM | POA: Diagnosis not present

## 2018-04-19 DIAGNOSIS — H548 Legal blindness, as defined in USA: Secondary | ICD-10-CM | POA: Diagnosis not present

## 2018-04-19 DIAGNOSIS — J45909 Unspecified asthma, uncomplicated: Secondary | ICD-10-CM | POA: Diagnosis not present

## 2018-04-19 DIAGNOSIS — M069 Rheumatoid arthritis, unspecified: Secondary | ICD-10-CM | POA: Diagnosis not present

## 2018-04-19 DIAGNOSIS — J209 Acute bronchitis, unspecified: Secondary | ICD-10-CM | POA: Diagnosis not present

## 2018-04-19 DIAGNOSIS — I1310 Hypertensive heart and chronic kidney disease without heart failure, with stage 1 through stage 4 chronic kidney disease, or unspecified chronic kidney disease: Secondary | ICD-10-CM | POA: Diagnosis not present

## 2018-04-19 DIAGNOSIS — E1122 Type 2 diabetes mellitus with diabetic chronic kidney disease: Secondary | ICD-10-CM | POA: Diagnosis not present

## 2018-04-19 DIAGNOSIS — N189 Chronic kidney disease, unspecified: Secondary | ICD-10-CM | POA: Diagnosis not present

## 2018-04-21 DIAGNOSIS — E1122 Type 2 diabetes mellitus with diabetic chronic kidney disease: Secondary | ICD-10-CM | POA: Diagnosis not present

## 2018-04-21 DIAGNOSIS — I1310 Hypertensive heart and chronic kidney disease without heart failure, with stage 1 through stage 4 chronic kidney disease, or unspecified chronic kidney disease: Secondary | ICD-10-CM | POA: Diagnosis not present

## 2018-04-21 DIAGNOSIS — J45909 Unspecified asthma, uncomplicated: Secondary | ICD-10-CM | POA: Diagnosis not present

## 2018-04-21 DIAGNOSIS — Z7984 Long term (current) use of oral hypoglycemic drugs: Secondary | ICD-10-CM | POA: Diagnosis not present

## 2018-04-21 DIAGNOSIS — J209 Acute bronchitis, unspecified: Secondary | ICD-10-CM | POA: Diagnosis not present

## 2018-04-21 DIAGNOSIS — D631 Anemia in chronic kidney disease: Secondary | ICD-10-CM | POA: Diagnosis not present

## 2018-04-21 DIAGNOSIS — N189 Chronic kidney disease, unspecified: Secondary | ICD-10-CM | POA: Diagnosis not present

## 2018-04-21 DIAGNOSIS — M069 Rheumatoid arthritis, unspecified: Secondary | ICD-10-CM | POA: Diagnosis not present

## 2018-04-21 DIAGNOSIS — H548 Legal blindness, as defined in USA: Secondary | ICD-10-CM | POA: Diagnosis not present

## 2018-04-21 DIAGNOSIS — Z7951 Long term (current) use of inhaled steroids: Secondary | ICD-10-CM | POA: Diagnosis not present

## 2018-04-25 DIAGNOSIS — H548 Legal blindness, as defined in USA: Secondary | ICD-10-CM | POA: Diagnosis not present

## 2018-04-25 DIAGNOSIS — I1310 Hypertensive heart and chronic kidney disease without heart failure, with stage 1 through stage 4 chronic kidney disease, or unspecified chronic kidney disease: Secondary | ICD-10-CM | POA: Diagnosis not present

## 2018-04-25 DIAGNOSIS — J45909 Unspecified asthma, uncomplicated: Secondary | ICD-10-CM | POA: Diagnosis not present

## 2018-04-25 DIAGNOSIS — E1122 Type 2 diabetes mellitus with diabetic chronic kidney disease: Secondary | ICD-10-CM | POA: Diagnosis not present

## 2018-04-25 DIAGNOSIS — M069 Rheumatoid arthritis, unspecified: Secondary | ICD-10-CM | POA: Diagnosis not present

## 2018-04-25 DIAGNOSIS — Z7951 Long term (current) use of inhaled steroids: Secondary | ICD-10-CM | POA: Diagnosis not present

## 2018-04-25 DIAGNOSIS — Z7984 Long term (current) use of oral hypoglycemic drugs: Secondary | ICD-10-CM | POA: Diagnosis not present

## 2018-04-25 DIAGNOSIS — D631 Anemia in chronic kidney disease: Secondary | ICD-10-CM | POA: Diagnosis not present

## 2018-04-25 DIAGNOSIS — J209 Acute bronchitis, unspecified: Secondary | ICD-10-CM | POA: Diagnosis not present

## 2018-04-25 DIAGNOSIS — N189 Chronic kidney disease, unspecified: Secondary | ICD-10-CM | POA: Diagnosis not present

## 2018-04-26 ENCOUNTER — Ambulatory Visit: Payer: Medicare Other | Admitting: Orthotics

## 2018-04-26 ENCOUNTER — Ambulatory Visit (INDEPENDENT_AMBULATORY_CARE_PROVIDER_SITE_OTHER): Payer: Medicare Other | Admitting: Orthotics

## 2018-04-26 DIAGNOSIS — M21619 Bunion of unspecified foot: Secondary | ICD-10-CM

## 2018-04-26 DIAGNOSIS — I129 Hypertensive chronic kidney disease with stage 1 through stage 4 chronic kidney disease, or unspecified chronic kidney disease: Secondary | ICD-10-CM | POA: Diagnosis not present

## 2018-04-26 DIAGNOSIS — E1142 Type 2 diabetes mellitus with diabetic polyneuropathy: Secondary | ICD-10-CM | POA: Diagnosis not present

## 2018-04-26 DIAGNOSIS — M2142 Flat foot [pes planus] (acquired), left foot: Secondary | ICD-10-CM | POA: Diagnosis not present

## 2018-04-26 DIAGNOSIS — M2141 Flat foot [pes planus] (acquired), right foot: Secondary | ICD-10-CM | POA: Diagnosis not present

## 2018-04-26 DIAGNOSIS — N189 Chronic kidney disease, unspecified: Secondary | ICD-10-CM | POA: Diagnosis not present

## 2018-04-26 DIAGNOSIS — J449 Chronic obstructive pulmonary disease, unspecified: Secondary | ICD-10-CM | POA: Diagnosis not present

## 2018-04-26 DIAGNOSIS — I252 Old myocardial infarction: Secondary | ICD-10-CM | POA: Diagnosis not present

## 2018-04-26 DIAGNOSIS — I25118 Atherosclerotic heart disease of native coronary artery with other forms of angina pectoris: Secondary | ICD-10-CM | POA: Diagnosis not present

## 2018-04-26 NOTE — Progress Notes (Signed)

## 2018-04-28 DIAGNOSIS — I1310 Hypertensive heart and chronic kidney disease without heart failure, with stage 1 through stage 4 chronic kidney disease, or unspecified chronic kidney disease: Secondary | ICD-10-CM | POA: Diagnosis not present

## 2018-04-28 DIAGNOSIS — N189 Chronic kidney disease, unspecified: Secondary | ICD-10-CM | POA: Diagnosis not present

## 2018-04-28 DIAGNOSIS — D631 Anemia in chronic kidney disease: Secondary | ICD-10-CM | POA: Diagnosis not present

## 2018-04-28 DIAGNOSIS — M069 Rheumatoid arthritis, unspecified: Secondary | ICD-10-CM | POA: Diagnosis not present

## 2018-04-28 DIAGNOSIS — Z7951 Long term (current) use of inhaled steroids: Secondary | ICD-10-CM | POA: Diagnosis not present

## 2018-04-28 DIAGNOSIS — J45909 Unspecified asthma, uncomplicated: Secondary | ICD-10-CM | POA: Diagnosis not present

## 2018-04-28 DIAGNOSIS — H548 Legal blindness, as defined in USA: Secondary | ICD-10-CM | POA: Diagnosis not present

## 2018-04-28 DIAGNOSIS — J209 Acute bronchitis, unspecified: Secondary | ICD-10-CM | POA: Diagnosis not present

## 2018-04-28 DIAGNOSIS — Z7984 Long term (current) use of oral hypoglycemic drugs: Secondary | ICD-10-CM | POA: Diagnosis not present

## 2018-04-28 DIAGNOSIS — E1122 Type 2 diabetes mellitus with diabetic chronic kidney disease: Secondary | ICD-10-CM | POA: Diagnosis not present

## 2018-04-29 DIAGNOSIS — H548 Legal blindness, as defined in USA: Secondary | ICD-10-CM | POA: Diagnosis not present

## 2018-04-29 DIAGNOSIS — I1310 Hypertensive heart and chronic kidney disease without heart failure, with stage 1 through stage 4 chronic kidney disease, or unspecified chronic kidney disease: Secondary | ICD-10-CM | POA: Diagnosis not present

## 2018-04-29 DIAGNOSIS — M069 Rheumatoid arthritis, unspecified: Secondary | ICD-10-CM | POA: Diagnosis not present

## 2018-04-29 DIAGNOSIS — Z7951 Long term (current) use of inhaled steroids: Secondary | ICD-10-CM | POA: Diagnosis not present

## 2018-04-29 DIAGNOSIS — E1122 Type 2 diabetes mellitus with diabetic chronic kidney disease: Secondary | ICD-10-CM | POA: Diagnosis not present

## 2018-04-29 DIAGNOSIS — J45909 Unspecified asthma, uncomplicated: Secondary | ICD-10-CM | POA: Diagnosis not present

## 2018-04-29 DIAGNOSIS — N189 Chronic kidney disease, unspecified: Secondary | ICD-10-CM | POA: Diagnosis not present

## 2018-04-29 DIAGNOSIS — D631 Anemia in chronic kidney disease: Secondary | ICD-10-CM | POA: Diagnosis not present

## 2018-04-29 DIAGNOSIS — Z7984 Long term (current) use of oral hypoglycemic drugs: Secondary | ICD-10-CM | POA: Diagnosis not present

## 2018-04-29 DIAGNOSIS — J209 Acute bronchitis, unspecified: Secondary | ICD-10-CM | POA: Diagnosis not present

## 2018-05-03 ENCOUNTER — Ambulatory Visit: Payer: Medicare Other | Admitting: Orthotics

## 2018-05-03 ENCOUNTER — Ambulatory Visit: Payer: Medicare Other | Admitting: Sports Medicine

## 2018-05-03 DIAGNOSIS — J209 Acute bronchitis, unspecified: Secondary | ICD-10-CM | POA: Diagnosis not present

## 2018-05-03 DIAGNOSIS — M069 Rheumatoid arthritis, unspecified: Secondary | ICD-10-CM | POA: Diagnosis not present

## 2018-05-03 DIAGNOSIS — Z7984 Long term (current) use of oral hypoglycemic drugs: Secondary | ICD-10-CM | POA: Diagnosis not present

## 2018-05-03 DIAGNOSIS — I1310 Hypertensive heart and chronic kidney disease without heart failure, with stage 1 through stage 4 chronic kidney disease, or unspecified chronic kidney disease: Secondary | ICD-10-CM | POA: Diagnosis not present

## 2018-05-03 DIAGNOSIS — J45909 Unspecified asthma, uncomplicated: Secondary | ICD-10-CM | POA: Diagnosis not present

## 2018-05-03 DIAGNOSIS — D631 Anemia in chronic kidney disease: Secondary | ICD-10-CM | POA: Diagnosis not present

## 2018-05-03 DIAGNOSIS — N189 Chronic kidney disease, unspecified: Secondary | ICD-10-CM | POA: Diagnosis not present

## 2018-05-03 DIAGNOSIS — H548 Legal blindness, as defined in USA: Secondary | ICD-10-CM | POA: Diagnosis not present

## 2018-05-03 DIAGNOSIS — Z7951 Long term (current) use of inhaled steroids: Secondary | ICD-10-CM | POA: Diagnosis not present

## 2018-05-03 DIAGNOSIS — E1122 Type 2 diabetes mellitus with diabetic chronic kidney disease: Secondary | ICD-10-CM | POA: Diagnosis not present

## 2018-05-06 DIAGNOSIS — M069 Rheumatoid arthritis, unspecified: Secondary | ICD-10-CM | POA: Diagnosis not present

## 2018-05-06 DIAGNOSIS — E1122 Type 2 diabetes mellitus with diabetic chronic kidney disease: Secondary | ICD-10-CM | POA: Diagnosis not present

## 2018-05-06 DIAGNOSIS — Z7984 Long term (current) use of oral hypoglycemic drugs: Secondary | ICD-10-CM | POA: Diagnosis not present

## 2018-05-06 DIAGNOSIS — N189 Chronic kidney disease, unspecified: Secondary | ICD-10-CM | POA: Diagnosis not present

## 2018-05-06 DIAGNOSIS — D631 Anemia in chronic kidney disease: Secondary | ICD-10-CM | POA: Diagnosis not present

## 2018-05-06 DIAGNOSIS — J45909 Unspecified asthma, uncomplicated: Secondary | ICD-10-CM | POA: Diagnosis not present

## 2018-05-06 DIAGNOSIS — I1310 Hypertensive heart and chronic kidney disease without heart failure, with stage 1 through stage 4 chronic kidney disease, or unspecified chronic kidney disease: Secondary | ICD-10-CM | POA: Diagnosis not present

## 2018-05-06 DIAGNOSIS — Z7951 Long term (current) use of inhaled steroids: Secondary | ICD-10-CM | POA: Diagnosis not present

## 2018-05-06 DIAGNOSIS — H548 Legal blindness, as defined in USA: Secondary | ICD-10-CM | POA: Diagnosis not present

## 2018-05-06 DIAGNOSIS — J209 Acute bronchitis, unspecified: Secondary | ICD-10-CM | POA: Diagnosis not present

## 2018-05-10 DIAGNOSIS — Z7984 Long term (current) use of oral hypoglycemic drugs: Secondary | ICD-10-CM | POA: Diagnosis not present

## 2018-05-10 DIAGNOSIS — I1310 Hypertensive heart and chronic kidney disease without heart failure, with stage 1 through stage 4 chronic kidney disease, or unspecified chronic kidney disease: Secondary | ICD-10-CM | POA: Diagnosis not present

## 2018-05-10 DIAGNOSIS — N189 Chronic kidney disease, unspecified: Secondary | ICD-10-CM | POA: Diagnosis not present

## 2018-05-10 DIAGNOSIS — M069 Rheumatoid arthritis, unspecified: Secondary | ICD-10-CM | POA: Diagnosis not present

## 2018-05-10 DIAGNOSIS — J45909 Unspecified asthma, uncomplicated: Secondary | ICD-10-CM | POA: Diagnosis not present

## 2018-05-10 DIAGNOSIS — H548 Legal blindness, as defined in USA: Secondary | ICD-10-CM | POA: Diagnosis not present

## 2018-05-10 DIAGNOSIS — D631 Anemia in chronic kidney disease: Secondary | ICD-10-CM | POA: Diagnosis not present

## 2018-05-10 DIAGNOSIS — E1122 Type 2 diabetes mellitus with diabetic chronic kidney disease: Secondary | ICD-10-CM | POA: Diagnosis not present

## 2018-05-10 DIAGNOSIS — Z7951 Long term (current) use of inhaled steroids: Secondary | ICD-10-CM | POA: Diagnosis not present

## 2018-05-10 DIAGNOSIS — J209 Acute bronchitis, unspecified: Secondary | ICD-10-CM | POA: Diagnosis not present

## 2018-05-12 DIAGNOSIS — N189 Chronic kidney disease, unspecified: Secondary | ICD-10-CM | POA: Diagnosis not present

## 2018-05-12 DIAGNOSIS — E1122 Type 2 diabetes mellitus with diabetic chronic kidney disease: Secondary | ICD-10-CM | POA: Diagnosis not present

## 2018-05-12 DIAGNOSIS — M069 Rheumatoid arthritis, unspecified: Secondary | ICD-10-CM | POA: Diagnosis not present

## 2018-05-12 DIAGNOSIS — I1310 Hypertensive heart and chronic kidney disease without heart failure, with stage 1 through stage 4 chronic kidney disease, or unspecified chronic kidney disease: Secondary | ICD-10-CM | POA: Diagnosis not present

## 2018-05-12 DIAGNOSIS — H548 Legal blindness, as defined in USA: Secondary | ICD-10-CM | POA: Diagnosis not present

## 2018-05-12 DIAGNOSIS — J45909 Unspecified asthma, uncomplicated: Secondary | ICD-10-CM | POA: Diagnosis not present

## 2018-05-12 DIAGNOSIS — Z7951 Long term (current) use of inhaled steroids: Secondary | ICD-10-CM | POA: Diagnosis not present

## 2018-05-12 DIAGNOSIS — D631 Anemia in chronic kidney disease: Secondary | ICD-10-CM | POA: Diagnosis not present

## 2018-05-12 DIAGNOSIS — Z7984 Long term (current) use of oral hypoglycemic drugs: Secondary | ICD-10-CM | POA: Diagnosis not present

## 2018-05-12 DIAGNOSIS — J209 Acute bronchitis, unspecified: Secondary | ICD-10-CM | POA: Diagnosis not present

## 2018-05-13 DIAGNOSIS — J209 Acute bronchitis, unspecified: Secondary | ICD-10-CM | POA: Diagnosis not present

## 2018-05-13 DIAGNOSIS — J45909 Unspecified asthma, uncomplicated: Secondary | ICD-10-CM | POA: Diagnosis not present

## 2018-05-13 DIAGNOSIS — M069 Rheumatoid arthritis, unspecified: Secondary | ICD-10-CM | POA: Diagnosis not present

## 2018-05-13 DIAGNOSIS — Z7951 Long term (current) use of inhaled steroids: Secondary | ICD-10-CM | POA: Diagnosis not present

## 2018-05-13 DIAGNOSIS — E1122 Type 2 diabetes mellitus with diabetic chronic kidney disease: Secondary | ICD-10-CM | POA: Diagnosis not present

## 2018-05-13 DIAGNOSIS — Z7984 Long term (current) use of oral hypoglycemic drugs: Secondary | ICD-10-CM | POA: Diagnosis not present

## 2018-05-13 DIAGNOSIS — D631 Anemia in chronic kidney disease: Secondary | ICD-10-CM | POA: Diagnosis not present

## 2018-05-13 DIAGNOSIS — N189 Chronic kidney disease, unspecified: Secondary | ICD-10-CM | POA: Diagnosis not present

## 2018-05-13 DIAGNOSIS — H548 Legal blindness, as defined in USA: Secondary | ICD-10-CM | POA: Diagnosis not present

## 2018-05-13 DIAGNOSIS — I1310 Hypertensive heart and chronic kidney disease without heart failure, with stage 1 through stage 4 chronic kidney disease, or unspecified chronic kidney disease: Secondary | ICD-10-CM | POA: Diagnosis not present

## 2018-05-18 DIAGNOSIS — I1310 Hypertensive heart and chronic kidney disease without heart failure, with stage 1 through stage 4 chronic kidney disease, or unspecified chronic kidney disease: Secondary | ICD-10-CM | POA: Diagnosis not present

## 2018-05-18 DIAGNOSIS — Z7984 Long term (current) use of oral hypoglycemic drugs: Secondary | ICD-10-CM | POA: Diagnosis not present

## 2018-05-18 DIAGNOSIS — Z7951 Long term (current) use of inhaled steroids: Secondary | ICD-10-CM | POA: Diagnosis not present

## 2018-05-18 DIAGNOSIS — N189 Chronic kidney disease, unspecified: Secondary | ICD-10-CM | POA: Diagnosis not present

## 2018-05-18 DIAGNOSIS — D631 Anemia in chronic kidney disease: Secondary | ICD-10-CM | POA: Diagnosis not present

## 2018-05-18 DIAGNOSIS — H548 Legal blindness, as defined in USA: Secondary | ICD-10-CM | POA: Diagnosis not present

## 2018-05-18 DIAGNOSIS — J209 Acute bronchitis, unspecified: Secondary | ICD-10-CM | POA: Diagnosis not present

## 2018-05-18 DIAGNOSIS — E1122 Type 2 diabetes mellitus with diabetic chronic kidney disease: Secondary | ICD-10-CM | POA: Diagnosis not present

## 2018-05-18 DIAGNOSIS — M069 Rheumatoid arthritis, unspecified: Secondary | ICD-10-CM | POA: Diagnosis not present

## 2018-05-18 DIAGNOSIS — J45909 Unspecified asthma, uncomplicated: Secondary | ICD-10-CM | POA: Diagnosis not present

## 2018-05-24 ENCOUNTER — Encounter: Payer: Medicare Other | Admitting: Psychology

## 2018-05-26 DIAGNOSIS — Z7984 Long term (current) use of oral hypoglycemic drugs: Secondary | ICD-10-CM | POA: Diagnosis not present

## 2018-05-26 DIAGNOSIS — D631 Anemia in chronic kidney disease: Secondary | ICD-10-CM | POA: Diagnosis not present

## 2018-05-26 DIAGNOSIS — I1310 Hypertensive heart and chronic kidney disease without heart failure, with stage 1 through stage 4 chronic kidney disease, or unspecified chronic kidney disease: Secondary | ICD-10-CM | POA: Diagnosis not present

## 2018-05-26 DIAGNOSIS — M069 Rheumatoid arthritis, unspecified: Secondary | ICD-10-CM | POA: Diagnosis not present

## 2018-05-26 DIAGNOSIS — H548 Legal blindness, as defined in USA: Secondary | ICD-10-CM | POA: Diagnosis not present

## 2018-05-26 DIAGNOSIS — N189 Chronic kidney disease, unspecified: Secondary | ICD-10-CM | POA: Diagnosis not present

## 2018-05-26 DIAGNOSIS — E1122 Type 2 diabetes mellitus with diabetic chronic kidney disease: Secondary | ICD-10-CM | POA: Diagnosis not present

## 2018-05-26 DIAGNOSIS — Z7951 Long term (current) use of inhaled steroids: Secondary | ICD-10-CM | POA: Diagnosis not present

## 2018-05-26 DIAGNOSIS — J209 Acute bronchitis, unspecified: Secondary | ICD-10-CM | POA: Diagnosis not present

## 2018-05-26 DIAGNOSIS — J45909 Unspecified asthma, uncomplicated: Secondary | ICD-10-CM | POA: Diagnosis not present

## 2018-05-27 ENCOUNTER — Encounter: Payer: Medicare Other | Attending: Family Medicine | Admitting: Psychology

## 2018-05-27 DIAGNOSIS — E119 Type 2 diabetes mellitus without complications: Secondary | ICD-10-CM | POA: Insufficient documentation

## 2018-05-27 DIAGNOSIS — F419 Anxiety disorder, unspecified: Secondary | ICD-10-CM | POA: Insufficient documentation

## 2018-05-27 DIAGNOSIS — Z809 Family history of malignant neoplasm, unspecified: Secondary | ICD-10-CM | POA: Insufficient documentation

## 2018-05-27 DIAGNOSIS — Z833 Family history of diabetes mellitus: Secondary | ICD-10-CM | POA: Insufficient documentation

## 2018-05-27 DIAGNOSIS — M48062 Spinal stenosis, lumbar region with neurogenic claudication: Secondary | ICD-10-CM | POA: Insufficient documentation

## 2018-05-27 DIAGNOSIS — F331 Major depressive disorder, recurrent, moderate: Secondary | ICD-10-CM | POA: Insufficient documentation

## 2018-05-27 DIAGNOSIS — R410 Disorientation, unspecified: Secondary | ICD-10-CM | POA: Diagnosis present

## 2018-05-27 DIAGNOSIS — R413 Other amnesia: Secondary | ICD-10-CM | POA: Diagnosis not present

## 2018-05-27 DIAGNOSIS — I10 Essential (primary) hypertension: Secondary | ICD-10-CM | POA: Insufficient documentation

## 2018-05-31 ENCOUNTER — Encounter: Payer: Self-pay | Admitting: Sports Medicine

## 2018-05-31 ENCOUNTER — Ambulatory Visit: Payer: Medicare Other | Admitting: Orthotics

## 2018-05-31 ENCOUNTER — Ambulatory Visit: Payer: Medicare Other | Admitting: Sports Medicine

## 2018-05-31 DIAGNOSIS — M79671 Pain in right foot: Secondary | ICD-10-CM | POA: Diagnosis not present

## 2018-05-31 DIAGNOSIS — M79672 Pain in left foot: Secondary | ICD-10-CM | POA: Diagnosis not present

## 2018-05-31 DIAGNOSIS — B351 Tinea unguium: Secondary | ICD-10-CM

## 2018-05-31 DIAGNOSIS — E1142 Type 2 diabetes mellitus with diabetic polyneuropathy: Secondary | ICD-10-CM | POA: Diagnosis not present

## 2018-05-31 NOTE — Progress Notes (Signed)
Subjective: Eddie Graves is a 84 y.o. male patient with history of diabetes who returns to office today complaining of long, painful nails  while ambulating in shoes; unable to trim. Patient is blind. Patient denies any changes since last encounter.   Patient Active Problem List   Diagnosis Date Noted  . Acute non Q wave myocardial infarction (HCC) 12/12/2016  . Uncontrolled type 2 diabetes mellitus with diabetic polyneuropathy, with long-term current use of insulin (HCC) 03/05/2016  . Tobacco abuse counseling 03/05/2016  . Right sided abdominal pain 03/05/2016  . Constipation 03/05/2016  . Chest pain at rest 11/06/2015  . Diastolic heart failure (HCC) 11/06/2015  . Acute bronchitis with chronic obstructive pulmonary disease (COPD) (HCC) 11/06/2015  . Neurological deficit, transient 11/06/2015  . Left facial numbness   . AKI (acute kidney injury) (HCC)   . Chest pain 05/19/2015  . History of gonorrhea 05/19/2015  . Hypotension 05/19/2015  . CKD (chronic kidney disease), stage III (HCC) 05/19/2015  . Involuntary muscle contractions 10/12/2014  . Seizure disorder after [facility] 08/19/2014  . Diabetic peripheral neuropathy associated with type 2 diabetes mellitus (HCC) 07/26/2014  . Blind 07/26/2014  . Tobacco abuse 04/26/2014  . Obesity (BMI 30-39.9) 08/18/2013  . COPD exacerbation (HCC) 08/06/2013  . SAH (subarachnoid hemorrhage) (HCC) 05/30/2013  . Coronary atherosclerosis of native coronary artery   . Vitamin D deficiency   . Memory loss   . COPD (chronic obstructive pulmonary disease) (HCC) 03/16/2013  . Essential hypertension, benign 03/16/2013  . Depressive disorder, not elsewhere classified 03/16/2013  . Spinal stenosis, lumbar region, with neurogenic claudication 03/16/2013   Current Outpatient Medications on File Prior to Visit  Medication Sig Dispense Refill  . acetaminophen (TYLENOL) 500 MG tablet Take 1 tablet (500 mg total) by mouth 3 (three) times daily as needed  for moderate pain. 90 tablet 3  . amLODipine (NORVASC) 5 MG tablet Take 5 mg by mouth daily.    Marland Kitchen aspirin EC 81 MG tablet 1/2 tablet by mouth daily per Cardiologist 15 tablet 11  . Cholecalciferol (VITAMIN D3) 2000 units capsule TAKE 1 CAPSULE (2,000 UNITS TOTAL) BY MOUTH DAILY. FOR VITAMIN DAY REPLETION 30 capsule 0  . clopidogrel (PLAVIX) 75 MG tablet Take 1 tablet (75 mg total) by mouth daily with breakfast. 30 tablet 3  . CRESTOR 20 MG tablet Take 20 mg by mouth daily. For cholesterol    . esomeprazole (NEXIUM) 40 MG capsule TAKE ONE CAPSULE BY MOUTH ONCE DAILY BEFORE BREAKFAST FOR STOMACH 30 capsule 0  . GARLIC PO Take 4,098 mcg by mouth daily.     Marland Kitchen glucose blood (PRODIGY NO CODING BLOOD GLUC) test strip PRODIGY TEST STRIPS, CHECK BLOOD SUGAR TWICE DAILY DX: E11.65, H54.7 PATIENT WITH BLINDNESS AND NEEDS STRIPS WITH READ ALOUD MONITOR 200 each 3  . Insulin Lispro Prot & Lispro ([name] 75/25 KWIKPEN) (75-25) 100 UNIT/ML Kwikpen Inject 40 Units into the skin 2 (two) times daily with a meal. 15 mL 11  . levalbuterol (XOPENEX) 1.25 MG/3ML nebulizer solution TAKE 1.25 MG (ONE AMPULE) BY NEBULIZATION EVERY 4 (FOUR) HOURS AS NEEDED FOR WHEEZING. 72 mL 0  . levETIRAcetam (KEPPRA XR) 500 MG 24 hr tablet TAKE TWO   TABLETS BY MOUTH   DAILY 180 tablet 2  . lipase/protease/amylase (CREON) 36000 UNITS CPEP capsule Two capsules twice a day with meals and one capsule with snack 24 capsule 0  . lisinopril (PRINIVIL,ZESTRIL) 20 MG tablet TAKE 1 TABLET (20 MG TOTAL) BY MOUTH DAILY  30 tablet 0  . metoprolol succinate (TOPROL-XL) 100 MG 24 hr tablet Take one tablet by mouth once daily for blood pressure 90 tablet 3  . mometasone-formoterol (DULERA) 200-5 MCG/ACT AERO Inhale 2 puffs into the lungs 2 (two) times daily. 2 Inhaler 3  . nitroGLYCERIN (NITROSTAT) 0.4 MG SL tablet Place 1 tablet (0.4 mg total) under the tongue every 5 (five) minutes as needed for chest pain. 25 tablet 12  . Omega 3 1200 MG CAPS  Take 2,400 mg by mouth daily. For cholesterol    . PROAIR HFA 108 (90 Base) MCG/ACT inhaler INHALE TWO PUFFS INTO THE LUNGS EVERY TWO HOURS IF NEEDED 18 g 0  . sertraline (ZOLOFT) 50 MG tablet TAKE ONE TABLET BY MOUTH ONCE DAILY 30 tablet 0  . tiotropium (SPIRIVA) 18 MCG inhalation capsule Place 1 capsule (18 mcg total) into inhaler and inhale daily. 30 capsule 12  . TRADJENTA 5 MG TABS tablet TAKE ONE TABLET BY MOUTH ONCE DAILY TO CONTROL BLOOD SUGAR 30 tablet 0   No current facility-administered medications on file prior to visit.    Allergies  Allergen Reactions  . Flexeril [Cyclobenzaprine] Other (See Comments)    delirium  . Nsaids Other (See Comments)    Renal failure    No results found for this or any previous visit (from the past 2160 hour(s)).  Objective: General: Patient is awake, alert, and oriented x 3 and in no acute distress.  Integument: Skin is warm, dry and supple bilateral. Nails are tender, long, thickened and dystrophic with subungual debris, consistent with onychomycosis, 1-5 bilateral. No signs of infection. No open lesions or preulcerative lesions present bilateral. History of callus at heels remains resolved. Remaining integument unremarkable.  Vasculature:  Dorsalis Pedis pulse 1/4 bilateral. Posterior Tibial pulse  1/4 bilateral. Capillary fill time <3 sec 1-5 bilateral. Scant hair growth to the level of the digits.Temperature gradient within normal limits. No varicosities present bilateral. No edema present bilateral.   Neurology: The patient has intact sensation measured with a 5.07/10g Semmes Weinstein Monofilament at all pedal sites bilateral. Vibratory sensation diminished bilateral with tuning fork. No Babinski sign present bilateral.   Musculoskeletal: Asymptomatic bunion and pes planus pedal deformities noted bilateral. Muscular strength 5/5 in all lower extremity muscular groups bilateral without pain on range of motion. No tenderness with calf  compression bilateral.  Assessment and Plan: Problem List Items Addressed This Visit    None    Visit Diagnoses    Dermatophytosis of nail    -  Primary   Diabetic polyneuropathy associated with type 2 diabetes mellitus (HCC)       Foot pain, bilateral         -Examined patient. -Discussed and educated patient on diabetic foot care, especially with regards to the vascular, neurological and musculoskeletal systems.  -Stressed the importance of good glycemic control and the detriment of not controlling glucose levels in relation to the foot. -Mechanically debrided all nails 1-5 bilateral using sterile nail nipper and filed with dremel without incident -Patient to return  in 3 months for at risk foot care -Patient advised to call the office if any problems or questions arise in the meantime.  Asencion Islam, DPM

## 2018-06-03 ENCOUNTER — Encounter

## 2018-06-03 ENCOUNTER — Ambulatory Visit: Payer: Medicare Other | Admitting: Psychology

## 2018-06-10 ENCOUNTER — Encounter: Payer: Self-pay | Admitting: Psychology

## 2018-06-10 NOTE — Progress Notes (Signed)
Today we continue to work on coping and adjustment issues related to the patient's primary issue at this point which is related to depression.  We reviewed issues related to significant stressors related to his marriage and the effects they continue to have.  The patient has been very concerned with intrusive thoughts regarding infidelity for his wife who is now deceased.  The issues related to his blindness and how her infidelity impacted his long-standing feelings about himself related to his blindness continue to be quite problematic for him.  The patient reports that he had improved significantly until recently when the depressive symptoms reemerged and he began having more intrusive thoughts.

## 2018-06-20 ENCOUNTER — Other Ambulatory Visit: Payer: Self-pay | Admitting: Internal Medicine

## 2018-06-21 DIAGNOSIS — E118 Type 2 diabetes mellitus with unspecified complications: Secondary | ICD-10-CM | POA: Diagnosis not present

## 2018-06-21 DIAGNOSIS — N189 Chronic kidney disease, unspecified: Secondary | ICD-10-CM | POA: Diagnosis not present

## 2018-06-21 DIAGNOSIS — T383X5A Adverse effect of insulin and oral hypoglycemic [antidiabetic] drugs, initial encounter: Secondary | ICD-10-CM | POA: Diagnosis not present

## 2018-06-21 DIAGNOSIS — E1165 Type 2 diabetes mellitus with hyperglycemia: Secondary | ICD-10-CM | POA: Diagnosis not present

## 2018-07-12 ENCOUNTER — Telehealth: Payer: Self-pay | Admitting: Psychology

## 2018-07-12 ENCOUNTER — Other Ambulatory Visit: Payer: Self-pay | Admitting: Internal Medicine

## 2018-07-12 NOTE — Telephone Encounter (Signed)
Pt(Eddie Graves) asked his daughter to call & pls try to get him in earlier with you.  He won't share why with anyone, he says he's getting worse & feels he needs to talk to someone...nothing available until 8/19!  Please call to advise... Bonita(daughter) [phone]   Thanks, Rosezella [place]

## 2018-07-13 ENCOUNTER — Other Ambulatory Visit: Payer: Self-pay | Admitting: Internal Medicine

## 2018-07-15 ENCOUNTER — Encounter

## 2018-07-15 ENCOUNTER — Ambulatory Visit: Payer: Medicare Other | Admitting: Psychology

## 2018-07-18 ENCOUNTER — Other Ambulatory Visit: Payer: Self-pay | Admitting: Internal Medicine

## 2018-07-25 ENCOUNTER — Encounter: Payer: Medicare Other | Attending: Family Medicine | Admitting: Psychology

## 2018-07-25 ENCOUNTER — Encounter: Payer: Self-pay | Admitting: Psychology

## 2018-07-25 DIAGNOSIS — Z809 Family history of malignant neoplasm, unspecified: Secondary | ICD-10-CM | POA: Insufficient documentation

## 2018-07-25 DIAGNOSIS — R410 Disorientation, unspecified: Secondary | ICD-10-CM | POA: Diagnosis present

## 2018-07-25 DIAGNOSIS — Z833 Family history of diabetes mellitus: Secondary | ICD-10-CM | POA: Insufficient documentation

## 2018-07-25 DIAGNOSIS — F419 Anxiety disorder, unspecified: Secondary | ICD-10-CM | POA: Insufficient documentation

## 2018-07-25 DIAGNOSIS — I10 Essential (primary) hypertension: Secondary | ICD-10-CM | POA: Insufficient documentation

## 2018-07-25 DIAGNOSIS — E119 Type 2 diabetes mellitus without complications: Secondary | ICD-10-CM | POA: Diagnosis not present

## 2018-07-25 DIAGNOSIS — F331 Major depressive disorder, recurrent, moderate: Secondary | ICD-10-CM | POA: Insufficient documentation

## 2018-07-25 DIAGNOSIS — M48062 Spinal stenosis, lumbar region with neurogenic claudication: Secondary | ICD-10-CM | POA: Diagnosis not present

## 2018-07-25 DIAGNOSIS — R413 Other amnesia: Secondary | ICD-10-CM | POA: Insufficient documentation

## 2018-07-25 NOTE — Progress Notes (Signed)
Today we have continue to work on the patient coping with long-standing lifelong traumas.  Today we spent on issues related to his marriage with his wife who was not always supportive and loyal to him.  The patient has a lot of distressing feelings and difficulties around these experiences.  The patient reports that his depression was significantly better for 2 or 3 weeks after our last visit.  He did report that he became more melancholic and introspective as he has few other people in his life around that he can talk to about these issues.  As he became more introspective his depression increased.  We worked on strategies for how to cope with these times and I provided him with some literature for him to get from the services for the blind to help him manage with these issues.

## 2018-08-22 DIAGNOSIS — E119 Type 2 diabetes mellitus without complications: Secondary | ICD-10-CM | POA: Diagnosis not present

## 2018-08-22 DIAGNOSIS — I10 Essential (primary) hypertension: Secondary | ICD-10-CM | POA: Diagnosis not present

## 2018-08-22 DIAGNOSIS — N189 Chronic kidney disease, unspecified: Secondary | ICD-10-CM | POA: Diagnosis not present

## 2018-08-22 DIAGNOSIS — I25118 Atherosclerotic heart disease of native coronary artery with other forms of angina pectoris: Secondary | ICD-10-CM | POA: Diagnosis not present

## 2018-08-22 DIAGNOSIS — I252 Old myocardial infarction: Secondary | ICD-10-CM | POA: Diagnosis not present

## 2018-08-24 DIAGNOSIS — D649 Anemia, unspecified: Secondary | ICD-10-CM | POA: Diagnosis not present

## 2018-08-24 DIAGNOSIS — E119 Type 2 diabetes mellitus without complications: Secondary | ICD-10-CM | POA: Diagnosis not present

## 2018-08-24 DIAGNOSIS — I119 Hypertensive heart disease without heart failure: Secondary | ICD-10-CM | POA: Diagnosis not present

## 2018-08-30 ENCOUNTER — Ambulatory Visit: Payer: Medicare Other | Admitting: Sports Medicine

## 2018-09-15 ENCOUNTER — Encounter: Payer: Medicare Other | Attending: Family Medicine | Admitting: Psychology

## 2018-09-15 DIAGNOSIS — F419 Anxiety disorder, unspecified: Secondary | ICD-10-CM | POA: Diagnosis not present

## 2018-09-15 DIAGNOSIS — Z833 Family history of diabetes mellitus: Secondary | ICD-10-CM | POA: Insufficient documentation

## 2018-09-15 DIAGNOSIS — E119 Type 2 diabetes mellitus without complications: Secondary | ICD-10-CM | POA: Diagnosis not present

## 2018-09-15 DIAGNOSIS — M48062 Spinal stenosis, lumbar region with neurogenic claudication: Secondary | ICD-10-CM | POA: Insufficient documentation

## 2018-09-15 DIAGNOSIS — R413 Other amnesia: Secondary | ICD-10-CM | POA: Diagnosis not present

## 2018-09-15 DIAGNOSIS — R410 Disorientation, unspecified: Secondary | ICD-10-CM | POA: Diagnosis present

## 2018-09-15 DIAGNOSIS — I10 Essential (primary) hypertension: Secondary | ICD-10-CM | POA: Diagnosis not present

## 2018-09-15 DIAGNOSIS — F331 Major depressive disorder, recurrent, moderate: Secondary | ICD-10-CM | POA: Diagnosis not present

## 2018-09-15 DIAGNOSIS — Z809 Family history of malignant neoplasm, unspecified: Secondary | ICD-10-CM | POA: Diagnosis not present

## 2018-09-21 ENCOUNTER — Encounter: Payer: Self-pay | Admitting: Psychology

## 2018-09-21 NOTE — Progress Notes (Signed)
Today we continue to work on therapeutic interventions around dealing with lifelong traumas.  Most of the issues that we discussed today had to do with how his recall of events and interactions with his deceased wife have continued to come to the forefront and calls deleterious effects on his ability to function day-to-day.  The patient struggles with how to communicate these issues with his children.  The patient reports that his depression has been significantly improving and the frequency, duration, and intensity of these episodes have reduced.

## 2018-09-26 DIAGNOSIS — M5416 Radiculopathy, lumbar region: Secondary | ICD-10-CM | POA: Diagnosis not present

## 2018-10-20 DIAGNOSIS — M5416 Radiculopathy, lumbar region: Secondary | ICD-10-CM | POA: Diagnosis not present

## 2018-10-26 DIAGNOSIS — M65332 Trigger finger, left middle finger: Secondary | ICD-10-CM | POA: Diagnosis not present

## 2018-10-26 DIAGNOSIS — M65331 Trigger finger, right middle finger: Secondary | ICD-10-CM | POA: Diagnosis not present

## 2018-11-24 ENCOUNTER — Encounter: Payer: Medicare Other | Attending: Family Medicine | Admitting: Psychology

## 2018-11-24 DIAGNOSIS — M48062 Spinal stenosis, lumbar region with neurogenic claudication: Secondary | ICD-10-CM | POA: Diagnosis not present

## 2018-11-24 DIAGNOSIS — Z833 Family history of diabetes mellitus: Secondary | ICD-10-CM | POA: Insufficient documentation

## 2018-11-24 DIAGNOSIS — R413 Other amnesia: Secondary | ICD-10-CM | POA: Insufficient documentation

## 2018-11-24 DIAGNOSIS — F419 Anxiety disorder, unspecified: Secondary | ICD-10-CM | POA: Insufficient documentation

## 2018-11-24 DIAGNOSIS — F331 Major depressive disorder, recurrent, moderate: Secondary | ICD-10-CM | POA: Insufficient documentation

## 2018-11-24 DIAGNOSIS — E119 Type 2 diabetes mellitus without complications: Secondary | ICD-10-CM | POA: Diagnosis not present

## 2018-11-24 DIAGNOSIS — I10 Essential (primary) hypertension: Secondary | ICD-10-CM | POA: Diagnosis not present

## 2018-11-24 DIAGNOSIS — Z809 Family history of malignant neoplasm, unspecified: Secondary | ICD-10-CM | POA: Insufficient documentation

## 2018-11-24 DIAGNOSIS — R410 Disorientation, unspecified: Secondary | ICD-10-CM | POA: Diagnosis present

## 2018-11-27 ENCOUNTER — Encounter: Payer: Self-pay | Admitting: Psychology

## 2018-11-27 NOTE — Progress Notes (Signed)
Today, we continue to work on therapeutic interventions around dealing with lifelong traumas particular associated with interactions and issues with his deceased wife.  The patient reports that he had a major breakthrough recently where he sat down and began talking about some of his issues with his daughter.  The daughter acknowledged some of the issues going back with the patient's deceased wife.  They both cried when talking about some longstanding issues.  The patient described times when his wife was alive and was having an affair with another gentleman around his desire to do harm to this gentleman.  The patient denies any current impulse to do harm to the gentleman but just the urge at the time to harm this man has caused a lot of distress with the patient and shame about what happened.

## 2018-12-21 DIAGNOSIS — M4800 Spinal stenosis, site unspecified: Secondary | ICD-10-CM | POA: Diagnosis not present

## 2018-12-21 DIAGNOSIS — M4801 Spinal stenosis, occipito-atlanto-axial region: Secondary | ICD-10-CM | POA: Diagnosis not present

## 2018-12-21 DIAGNOSIS — H5460 Unqualified visual loss, one eye, unspecified: Secondary | ICD-10-CM | POA: Diagnosis not present

## 2018-12-21 DIAGNOSIS — Z0000 Encounter for general adult medical examination without abnormal findings: Secondary | ICD-10-CM | POA: Diagnosis not present

## 2018-12-21 DIAGNOSIS — M130 Polyarthritis, unspecified: Secondary | ICD-10-CM | POA: Diagnosis not present

## 2018-12-21 DIAGNOSIS — N189 Chronic kidney disease, unspecified: Secondary | ICD-10-CM | POA: Diagnosis not present

## 2018-12-21 DIAGNOSIS — E118 Type 2 diabetes mellitus with unspecified complications: Secondary | ICD-10-CM | POA: Diagnosis not present

## 2018-12-21 DIAGNOSIS — I10 Essential (primary) hypertension: Secondary | ICD-10-CM | POA: Diagnosis not present

## 2019-01-06 DIAGNOSIS — M130 Polyarthritis, unspecified: Secondary | ICD-10-CM | POA: Diagnosis not present

## 2019-01-11 ENCOUNTER — Ambulatory Visit
Admission: RE | Admit: 2019-01-11 | Discharge: 2019-01-11 | Disposition: A | Discharge status: Home / Self Care | Payer: Medicare Other | Source: Ambulatory Visit | Attending: Family Medicine | Admitting: Family Medicine

## 2019-01-11 ENCOUNTER — Other Ambulatory Visit: Payer: Self-pay | Admitting: Family Medicine

## 2019-01-11 DIAGNOSIS — R52 Pain, unspecified: Secondary | ICD-10-CM

## 2019-01-11 DIAGNOSIS — R10812 Left upper quadrant abdominal tenderness: Secondary | ICD-10-CM | POA: Diagnosis not present

## 2019-01-27 DIAGNOSIS — E119 Type 2 diabetes mellitus without complications: Secondary | ICD-10-CM | POA: Diagnosis not present

## 2019-01-27 DIAGNOSIS — I2510 Atherosclerotic heart disease of native coronary artery without angina pectoris: Secondary | ICD-10-CM | POA: Diagnosis not present

## 2019-01-27 DIAGNOSIS — Z794 Long term (current) use of insulin: Secondary | ICD-10-CM | POA: Diagnosis not present

## 2019-01-27 DIAGNOSIS — I10 Essential (primary) hypertension: Secondary | ICD-10-CM | POA: Diagnosis not present

## 2019-01-27 DIAGNOSIS — Z9181 History of falling: Secondary | ICD-10-CM | POA: Diagnosis not present

## 2019-01-27 DIAGNOSIS — M1990 Unspecified osteoarthritis, unspecified site: Secondary | ICD-10-CM | POA: Diagnosis not present

## 2019-01-27 DIAGNOSIS — H548 Legal blindness, as defined in USA: Secondary | ICD-10-CM | POA: Diagnosis not present

## 2019-01-27 DIAGNOSIS — Z955 Presence of coronary angioplasty implant and graft: Secondary | ICD-10-CM | POA: Diagnosis not present

## 2019-01-27 DIAGNOSIS — J45909 Unspecified asthma, uncomplicated: Secondary | ICD-10-CM | POA: Diagnosis not present

## 2019-01-27 DIAGNOSIS — Z87891 Personal history of nicotine dependence: Secondary | ICD-10-CM | POA: Diagnosis not present

## 2019-01-28 DIAGNOSIS — E119 Type 2 diabetes mellitus without complications: Secondary | ICD-10-CM | POA: Diagnosis not present

## 2019-01-28 DIAGNOSIS — M1990 Unspecified osteoarthritis, unspecified site: Secondary | ICD-10-CM | POA: Diagnosis not present

## 2019-01-28 DIAGNOSIS — Z794 Long term (current) use of insulin: Secondary | ICD-10-CM | POA: Diagnosis not present

## 2019-01-28 DIAGNOSIS — Z87891 Personal history of nicotine dependence: Secondary | ICD-10-CM | POA: Diagnosis not present

## 2019-01-28 DIAGNOSIS — H548 Legal blindness, as defined in USA: Secondary | ICD-10-CM | POA: Diagnosis not present

## 2019-01-28 DIAGNOSIS — J45909 Unspecified asthma, uncomplicated: Secondary | ICD-10-CM | POA: Diagnosis not present

## 2019-01-28 DIAGNOSIS — I10 Essential (primary) hypertension: Secondary | ICD-10-CM | POA: Diagnosis not present

## 2019-01-28 DIAGNOSIS — Z955 Presence of coronary angioplasty implant and graft: Secondary | ICD-10-CM | POA: Diagnosis not present

## 2019-01-28 DIAGNOSIS — I2510 Atherosclerotic heart disease of native coronary artery without angina pectoris: Secondary | ICD-10-CM | POA: Diagnosis not present

## 2019-01-28 DIAGNOSIS — Z9181 History of falling: Secondary | ICD-10-CM | POA: Diagnosis not present

## 2019-01-30 DIAGNOSIS — I2510 Atherosclerotic heart disease of native coronary artery without angina pectoris: Secondary | ICD-10-CM | POA: Diagnosis not present

## 2019-01-30 DIAGNOSIS — I10 Essential (primary) hypertension: Secondary | ICD-10-CM | POA: Diagnosis not present

## 2019-01-30 DIAGNOSIS — E119 Type 2 diabetes mellitus without complications: Secondary | ICD-10-CM | POA: Diagnosis not present

## 2019-01-30 DIAGNOSIS — Z87440 Personal history of urinary (tract) infections: Secondary | ICD-10-CM | POA: Diagnosis not present

## 2019-01-30 DIAGNOSIS — Z794 Long term (current) use of insulin: Secondary | ICD-10-CM | POA: Diagnosis not present

## 2019-01-30 DIAGNOSIS — Z955 Presence of coronary angioplasty implant and graft: Secondary | ICD-10-CM | POA: Diagnosis not present

## 2019-01-30 DIAGNOSIS — Z9181 History of falling: Secondary | ICD-10-CM | POA: Diagnosis not present

## 2019-01-30 DIAGNOSIS — J45909 Unspecified asthma, uncomplicated: Secondary | ICD-10-CM | POA: Diagnosis not present

## 2019-01-30 DIAGNOSIS — Z87891 Personal history of nicotine dependence: Secondary | ICD-10-CM | POA: Diagnosis not present

## 2019-01-30 DIAGNOSIS — M1990 Unspecified osteoarthritis, unspecified site: Secondary | ICD-10-CM | POA: Diagnosis not present

## 2019-01-30 DIAGNOSIS — H548 Legal blindness, as defined in USA: Secondary | ICD-10-CM | POA: Diagnosis not present

## 2019-01-31 ENCOUNTER — Emergency Department (HOSPITAL_COMMUNITY)
Admission: EM | Admit: 2019-01-31 | Discharge: 2019-01-31 | Disposition: A | Discharge status: Home / Self Care | Payer: Medicare Other | Attending: Emergency Medicine | Admitting: Emergency Medicine

## 2019-01-31 ENCOUNTER — Emergency Department (HOSPITAL_COMMUNITY): Payer: Medicare Other

## 2019-01-31 ENCOUNTER — Encounter (HOSPITAL_COMMUNITY): Payer: Self-pay | Admitting: Emergency Medicine

## 2019-01-31 DIAGNOSIS — R0789 Other chest pain: Secondary | ICD-10-CM | POA: Diagnosis not present

## 2019-01-31 DIAGNOSIS — Z7902 Long term (current) use of antithrombotics/antiplatelets: Secondary | ICD-10-CM | POA: Insufficient documentation

## 2019-01-31 DIAGNOSIS — N183 Chronic kidney disease, stage 3 (moderate): Secondary | ICD-10-CM | POA: Insufficient documentation

## 2019-01-31 DIAGNOSIS — Z955 Presence of coronary angioplasty implant and graft: Secondary | ICD-10-CM | POA: Insufficient documentation

## 2019-01-31 DIAGNOSIS — E1142 Type 2 diabetes mellitus with diabetic polyneuropathy: Secondary | ICD-10-CM | POA: Insufficient documentation

## 2019-01-31 DIAGNOSIS — E1122 Type 2 diabetes mellitus with diabetic chronic kidney disease: Secondary | ICD-10-CM | POA: Insufficient documentation

## 2019-01-31 DIAGNOSIS — Z7982 Long term (current) use of aspirin: Secondary | ICD-10-CM | POA: Insufficient documentation

## 2019-01-31 DIAGNOSIS — J80 Acute respiratory distress syndrome: Secondary | ICD-10-CM | POA: Diagnosis not present

## 2019-01-31 DIAGNOSIS — F17210 Nicotine dependence, cigarettes, uncomplicated: Secondary | ICD-10-CM | POA: Diagnosis not present

## 2019-01-31 DIAGNOSIS — R062 Wheezing: Secondary | ICD-10-CM | POA: Diagnosis not present

## 2019-01-31 DIAGNOSIS — I2510 Atherosclerotic heart disease of native coronary artery without angina pectoris: Secondary | ICD-10-CM | POA: Insufficient documentation

## 2019-01-31 DIAGNOSIS — J441 Chronic obstructive pulmonary disease with (acute) exacerbation: Secondary | ICD-10-CM | POA: Diagnosis not present

## 2019-01-31 DIAGNOSIS — I130 Hypertensive heart and chronic kidney disease with heart failure and stage 1 through stage 4 chronic kidney disease, or unspecified chronic kidney disease: Secondary | ICD-10-CM | POA: Diagnosis not present

## 2019-01-31 DIAGNOSIS — Z79899 Other long term (current) drug therapy: Secondary | ICD-10-CM | POA: Insufficient documentation

## 2019-01-31 DIAGNOSIS — Z794 Long term (current) use of insulin: Secondary | ICD-10-CM | POA: Insufficient documentation

## 2019-01-31 DIAGNOSIS — I5030 Unspecified diastolic (congestive) heart failure: Secondary | ICD-10-CM | POA: Diagnosis not present

## 2019-01-31 DIAGNOSIS — R079 Chest pain, unspecified: Secondary | ICD-10-CM | POA: Diagnosis not present

## 2019-01-31 DIAGNOSIS — E1140 Type 2 diabetes mellitus with diabetic neuropathy, unspecified: Secondary | ICD-10-CM | POA: Insufficient documentation

## 2019-01-31 DIAGNOSIS — R0602 Shortness of breath: Secondary | ICD-10-CM | POA: Diagnosis not present

## 2019-01-31 LAB — CBC WITH DIFFERENTIAL/PLATELET
Abs Immature Granulocytes: 0.04 10*3/uL (ref 0.00–0.07)
Basophils Absolute: 0.1 10*3/uL (ref 0.0–0.1)
Basophils Relative: 1 %
EOS PCT: 3 %
Eosinophils Absolute: 0.2 10*3/uL (ref 0.0–0.5)
HCT: 39.1 % (ref 39.0–52.0)
Hemoglobin: 11.9 g/dL — ABNORMAL LOW (ref 13.0–17.0)
Immature Granulocytes: 1 %
Lymphocytes Relative: 28 %
Lymphs Abs: 2.3 10*3/uL (ref 0.7–4.0)
MCH: 27.4 pg (ref 26.0–34.0)
MCHC: 30.4 g/dL (ref 30.0–36.0)
MCV: 89.9 fL (ref 80.0–100.0)
Monocytes Absolute: 0.8 10*3/uL (ref 0.1–1.0)
Monocytes Relative: 10 %
Neutro Abs: 4.8 10*3/uL (ref 1.7–7.7)
Neutrophils Relative %: 57 %
Platelets: 271 10*3/uL (ref 150–400)
RBC: 4.35 MIL/uL (ref 4.22–5.81)
RDW: 13.8 % (ref 11.5–15.5)
WBC: 8.1 10*3/uL (ref 4.0–10.5)
nRBC: 0 % (ref 0.0–0.2)

## 2019-01-31 LAB — BASIC METABOLIC PANEL
Anion gap: 9 (ref 5–15)
BUN: 9 mg/dL (ref 8–23)
CHLORIDE: 106 mmol/L (ref 98–111)
CO2: 24 mmol/L (ref 22–32)
Calcium: 9.1 mg/dL (ref 8.9–10.3)
Creatinine, Ser: 1.37 mg/dL — ABNORMAL HIGH (ref 0.61–1.24)
GFR calc Af Amer: 54 mL/min — ABNORMAL LOW (ref >60)
GFR calc non Af Amer: 47 mL/min — ABNORMAL LOW (ref >60)
Glucose, Bld: 113 mg/dL — ABNORMAL HIGH (ref 70–99)
Potassium: 4.2 mmol/L (ref 3.5–5.1)
Sodium: 139 mmol/L (ref 135–145)

## 2019-01-31 LAB — TROPONIN I: Troponin I: <0.03 ng/mL (ref <0.03)

## 2019-01-31 LAB — BRAIN NATRIURETIC PEPTIDE: B NATRIURETIC PEPTIDE 5: 33.1 pg/mL (ref 0.0–100.0)

## 2019-01-31 MED ORDER — DOXYCYCLINE HYCLATE 100 MG PO CAPS: 100.0000 mg | ORAL_CAPSULE | Freq: Two times a day (BID) | ORAL | 0 refills | Status: DC

## 2019-01-31 MED ORDER — PREDNISONE 20 MG PO TABS: 40.0000 mg | ORAL_TABLET | Freq: Every day | ORAL | 0 refills | Status: DC

## 2019-01-31 NOTE — ED Notes (Signed)
Pt unable to sign for discharge. Pt is blind. Pt agreeable to discharge and instructions. Pt understands he has prescriptions to pick up.

## 2019-01-31 NOTE — ED Provider Notes (Signed)
[facility]ENC[facility] Provider Note   CSN: [id number] Arrival date & time: 01/31/19  [id number]    History   Chief Complaint Chief Complaint  Patient presents with  . Shortness of Breath    HPI Eddie Graves is a 85 y.o. male.     Patient brought to the emergency department from home by EMS for shortness of breath.  Patient with a history of COPD.  He reports that he went to bed feeling well but woke up feeling short of breath.  EMS reports that the patient had significant wheezing throughout all lung fields.  He had not used his inhaler at home.  Patient has been administered albuterol 10 mg and Atrovent 0.5 mg during transport.  At arrival to the ER patient states "I feel much better".  Did feel like he had some tightness in his chest initially but this has improved with the nebulizers as well.     Past Medical History:  Diagnosis Date  . Anxiety state, unspecified   . Blind   . Chronic airway obstruction, not elsewhere classified   . Coronary atherosclerosis of native coronary artery   . Cough   . Depressive disorder, not elsewhere classified   . DM (diabetes mellitus) type II controlled peripheral vascular disorder   . Essential hypertension, benign   . H/O hiatal hernia   . Headache(784.0)   . Legal blindness, [place] defined in [place]   . Memory loss   . Other and unspecified hyperlipidemia   . Pain in joint, site unspecified   . Palpitations   . Rash and other nonspecific skin eruption   . Reflux esophagitis   . Sebaceous cyst   . Spinal stenosis, lumbar region, with neurogenic claudication   . Type II diabetes mellitus with neurological manifestations, uncontrolled (HCC)   . Unspecified constipation   . Vitamin D deficiency     Patient Active Problem List   Diagnosis Date Noted  . Acute non Q wave myocardial infarction (HCC) 12/12/2016  . Uncontrolled type 2 diabetes mellitus with diabetic polyneuropathy, with long-term current use of  insulin (HCC) 03/05/2016  . Tobacco abuse counseling 03/05/2016  . Right sided abdominal pain 03/05/2016  . Constipation 03/05/2016  . Chest pain at rest 11/06/2015  . Diastolic heart failure (HCC) 11/06/2015  . Acute bronchitis with chronic obstructive pulmonary disease (COPD) (HCC) 11/06/2015  . Neurological deficit, transient 11/06/2015  . Left facial numbness   . AKI (acute kidney injury) (HCC)   . Chest pain 05/19/2015  . History of gonorrhea 05/19/2015  . Hypotension 05/19/2015  . CKD (chronic kidney disease), stage III (HCC) 05/19/2015  . Involuntary muscle contractions 10/12/2014  . [facility]  . Diabetic peripheral neuropathy associated with type 2 diabetes mellitus (HCC) 07/26/2014  . Blind 07/26/2014  . Tobacco abuse 04/26/2014  . Obesity (BMI 30-39.9) 08/18/2013  . COPD exacerbation (HCC) 08/06/2013  . SAH (subarachnoid hemorrhage) (HCC) 05/30/2013  . Coronary atherosclerosis of native coronary artery   . Vitamin D deficiency   . Memory loss   . COPD (chronic obstructive pulmonary disease) (HCC) 03/16/2013  . Essential hypertension, benign 03/16/2013  . Depressive disorder, not elsewhere classified 03/16/2013  . Spinal stenosis, lumbar region, with neurogenic claudication 03/16/2013    Past Surgical History:  Procedure Laterality Date  . CARDIAC CATHETERIZATION N/A 12/15/2016   Procedure: Left Heart Cath and Coronary Angiography;  Surgeon: Rinaldo CloudMohan Harwani, MD;  [facility];  Service: Cardiovascular;  Laterality: N/A;  . CORONARY ANGIOPLASTY WITH STENT PLACEMENT    . EYE SURGERY     right prosthetic globe  . HERNIA REPAIR          Home Medications    Prior to Admission medications   Medication Sig Start Date End Date Taking? Authorizing Provider  acetaminophen (TYLENOL) 500 MG tablet Take 1 tablet (500 mg total) by mouth 3 (three) times daily as needed for moderate pain. 04/12/17   Reed, Tiffany L, DO  amLODipine  (NORVASC) 5 MG tablet Take 5 mg by mouth daily.    [provider]  aspirin EC 81 MG tablet 1/2 tablet by mouth daily per Cardiologist 11/05/16   Renato Gails, Tiffany L, DO  Cholecalciferol (VITAMIN D3) 2000 units capsule TAKE 1 CAPSULE (2,000 UNITS TOTAL) BY MOUTH DAILY. FOR VITAMIN DAY REPLETION 01/17/18   Reed, Tiffany L, DO  clopidogrel (PLAVIX) 75 MG tablet Take 1 tablet (75 mg total) by mouth daily with breakfast. 12/17/16   Rinaldo Cloud, MD  CRESTOR 20 MG tablet Take 20 mg by mouth daily. For cholesterol 08/13/14   [provider]  doxycycline (VIBRAMYCIN) 100 MG capsule Take 1 capsule (100 mg total) by mouth 2 (two) times daily. 01/31/19   Gilda Crease, MD  esomeprazole (NEXIUM) 40 MG capsule TAKE ONE CAPSULE BY MOUTH ONCE DAILY BEFORE BREAKFAST FOR STOMACH 01/17/18   Reed, Tiffany L, DO  GARLIC PO Take 7,124 mcg by mouth daily.     [provider]  glucose blood (PRODIGY NO CODING BLOOD GLUC) test strip PRODIGY TEST STRIPS, CHECK BLOOD SUGAR TWICE DAILY DX: E11.65, H54.7 PATIENT WITH BLINDNESS AND NEEDS STRIPS WITH READ ALOUD MONITOR 03/08/18   Reed, Tiffany L, DO  Insulin Lispro Prot & Lispro ([name] 75/25 KWIKPEN) (75-25) 100 UNIT/ML Kwikpen Inject 40 Units into the skin 2 (two) times daily with a meal. 03/16/17   Reed, Tiffany L, DO  levalbuterol (XOPENEX) 1.25 MG/3ML nebulizer solution TAKE 1.25 MG (ONE AMPULE) BY NEBULIZATION EVERY 4 (FOUR) HOURS AS NEEDED FOR WHEEZING. 01/17/18   Reed, Tiffany L, DO  levETIRAcetam (KEPPRA XR) 500 MG 24 hr tablet TAKE TWO   TABLETS BY MOUTH   DAILY 07/01/17   Reed, Tiffany L, DO  lipase/protease/amylase (CREON) 36000 UNITS CPEP capsule Two capsules twice a day with meals and one capsule with snack 04/12/17   Reed, Tiffany L, DO  lisinopril (PRINIVIL,ZESTRIL) 20 MG tablet TAKE 1 TABLET (20 MG TOTAL) BY MOUTH DAILY 01/17/18   Reed, Tiffany L, DO  metoprolol succinate (TOPROL-XL) 100 MG 24 hr tablet Take one tablet by mouth once daily  for blood pressure 06/06/15   Reed, Tiffany L, DO  mometasone-formoterol (DULERA) 200-5 MCG/ACT AERO Inhale 2 puffs into the lungs 2 (two) times daily. 12/16/16   Rinaldo Cloud, MD  nitroGLYCERIN (NITROSTAT) 0.4 MG SL tablet Place 1 tablet (0.4 mg total) under the tongue every 5 (five) minutes as needed for chest pain. 12/16/16   Rinaldo Cloud, MD  Omega 3 1200 MG CAPS Take 2,400 mg by mouth daily. For cholesterol    [provider]  predniSONE (DELTASONE) 20 MG tablet Take 2 tablets (40 mg total) by mouth daily with breakfast. 01/31/19   Gilda Crease, MD  PROAIR HFA 108 [phone] Base) MCG/ACT inhaler INHALE TWO PUFFS INTO THE LUNGS EVERY TWO HOURS IF NEEDED 01/17/18   Reed, Tiffany L, DO  sertraline (ZOLOFT) 50 MG tablet TAKE ONE TABLET BY MOUTH ONCE DAILY 01/17/18   Renato Gails,  Tiffany L, DO  tiotropium (SPIRIVA) 18 MCG inhalation capsule Place 1 capsule (18 mcg total) into inhaler and inhale daily. 12/17/16   Rinaldo CloudHarwani, Mohan, MD  TRADJENTA 5 MG TABS tablet TAKE ONE TABLET BY MOUTH ONCE DAILY TO CONTROL BLOOD SUGAR 01/17/18   Kermit Baloeed, Tiffany L, DO    Family History Family History  Problem Relation Age of Onset  . Asthma Mother   . Diabetes Son   . Cancer Daughter   . Diabetes Son     Social History Social History   Tobacco Use  . Smoking status: Light Tobacco Smoker    Years: 50.00    Types: Cigarettes    Last attempt to quit: 03/07/2013    Years since quitting: 5.9  . Smokeless tobacco: Never Used  . Tobacco comment: Patient say he smokes 4 cigs. per day  Substance Use Topics  . Alcohol use: No    Alcohol/week: 0.0 standard drinks  . Drug use: No     Allergies   Flexeril [cyclobenzaprine] and Nsaids   Review of Systems Review of Systems  Respiratory: Positive for chest tightness, shortness of breath and wheezing.   All other systems reviewed and are negative.    Physical Exam Updated Vital Signs BP 118/79   Pulse 82   Temp (!) 97.1 F (36.2 C)   Resp 19   SpO2  96%   Physical Exam Vitals signs and nursing note reviewed.  Constitutional:      General: He is not in acute distress.    Appearance: Normal appearance. He is well-developed.  HENT:     Head: Normocephalic and atraumatic.     Right Ear: Hearing normal.     Left Ear: Hearing normal.     Nose: Nose normal.  Eyes:     Conjunctiva/sclera: Conjunctivae normal.     Pupils: Pupils are equal, round, and reactive to light.  Neck:     Musculoskeletal: Normal range of motion and neck supple.  Cardiovascular:     Rate and Rhythm: Regular rhythm.     Heart sounds: S1 normal and S2 normal. No murmur. No friction rub. No gallop.   Pulmonary:     Effort: Pulmonary effort is normal. No respiratory distress.     Breath sounds: Wheezing present.  Chest:     Chest wall: No tenderness.  Abdominal:     General: Bowel sounds are normal.     Palpations: Abdomen is soft.     Tenderness: There is no abdominal tenderness. There is no guarding or rebound. Negative signs include Murphy's sign and McBurney's sign.     Hernia: No hernia is present.  Musculoskeletal: Normal range of motion.  Skin:    General: Skin is warm and dry.     Findings: No rash.  Neurological:     Mental Status: He is alert and oriented to person, place, and time.     GCS: GCS eye subscore is 4. GCS verbal subscore is 5. GCS motor subscore is 6.     Cranial Nerves: No cranial nerve deficit.     Sensory: No sensory deficit.     Coordination: Coordination normal.  Psychiatric:        Speech: Speech normal.        Behavior: Behavior normal.        Thought Content: Thought content normal.      ED Treatments / Results  Labs (all labs ordered are listed, but only abnormal results are displayed) Labs Reviewed  CBC WITH  DIFFERENTIAL/PLATELET - Abnormal; Notable for the following components:      Result Value   Hemoglobin 11.9 (*)    All other components within normal limits  BASIC METABOLIC PANEL - Abnormal; Notable for the  following components:   Glucose, Bld 113 (*)    Creatinine, Ser 1.37 (*)    GFR calc non Af Amer 47 (*)    GFR calc Af Amer 54 (*)    All other components within normal limits  TROPONIN I  BRAIN NATRIURETIC PEPTIDE    EKG EKG Interpretation  Date/Time:  Tuesday January 31 2019 06:15:12 EST Ventricular Rate:  82 PR Interval:    QRS Duration: 95 QT Interval:  382 QTC Calculation: 447 R Axis:   -49 Text Interpretation:  Sinus rhythm Prolonged PR interval LAD, consider left anterior fascicular block No significant change since last tracing Confirmed by Gilda Crease [phone]) on 01/31/2019 6:31:05 AM Also confirmed by Gilda Crease [phone]), editor Barbette Hair [phone])  on 01/31/2019 7:31:27 AM   Radiology Dg Chest 2 View  Result Date: 01/31/2019 CLINICAL DATA:  85 year old male with shortness of breath and left-sided chest pain. EXAM: CHEST - 2 VIEW COMPARISON:  Chest radiograph dated 12/12/2016 FINDINGS: There is diffuse interstitial coarsening. No focal consolidation, pleural effusion, pneumothorax. Stable cardiac silhouette. No acute osseous pathology. IMPRESSION: No active cardiopulmonary disease. Electronically Signed   By: Elgie Collard M.D.   On: 01/31/2019 06:44    Procedures Procedures (including critical care time)  Medications Ordered in ED Medications - No data to display   Initial Impression / Assessment and Plan / ED Course  I have reviewed the triage vital signs and the nursing notes.  Pertinent labs & imaging results that were available during my care of the patient were reviewed by me and considered in my medical decision making (see chart for details).        Patient presents to the emergency department for evaluation of shortness of breath.  Patient does have history of COPD.  He comes to the ER by EMS.  He has been treated with continuous nebulizer plus Atrovent during transport and feels much improved.  EMS report that he had  significant wheezing initially.  At arrival he had some residual wheezing but good air movement and no hypoxia.  Chest x-ray does not show any evidence of pneumonia.  Patient has been monitored and he has continued to do well.  Repeat examination reveals no further wheezing and he reports that he is back to his normal baseline.  Does not require hospitalization, outpatient treatment for COPD exacerbation.  Final Clinical Impressions(s) / ED Diagnoses   Final diagnoses:  COPD exacerbation [facility])    ED Discharge Orders         Ordered    predniSONE (DELTASONE) 20 MG tablet  Daily with breakfast     01/31/19 0735    doxycycline (VIBRAMYCIN) 100 MG capsule  2 times daily     01/31/19 0735           Gilda Crease, MD 02/01/19 [phone]

## 2019-01-31 NOTE — ED Notes (Signed)
Patient transported to X-ray 

## 2019-01-31 NOTE — ED Triage Notes (Signed)
Per EMS, pt from home, woke up SOB w/ wheezing in all fields.  Pt was given 10 albuterol, .5 atrovent.  Pt does endorse "slight chest pain" has a nitro patch on.  Pt was 100% on room air.  Pt is blind.

## 2019-01-31 NOTE — ED Provider Notes (Signed)
Assumed care in sign out at change of shift. Still some wheezing on exam but he reports feeling "much better" and is laughing. o2 stas good on RA. Clinically suspect mild COPD exacerbation. Plan course of steroids and doxy.    Raeford Razor, MD 01/31/19 [phone]

## 2019-02-01 DIAGNOSIS — E119 Type 2 diabetes mellitus without complications: Secondary | ICD-10-CM | POA: Diagnosis not present

## 2019-02-01 DIAGNOSIS — Z87891 Personal history of nicotine dependence: Secondary | ICD-10-CM | POA: Diagnosis not present

## 2019-02-01 DIAGNOSIS — J45909 Unspecified asthma, uncomplicated: Secondary | ICD-10-CM | POA: Diagnosis not present

## 2019-02-01 DIAGNOSIS — Z794 Long term (current) use of insulin: Secondary | ICD-10-CM | POA: Diagnosis not present

## 2019-02-01 DIAGNOSIS — M1990 Unspecified osteoarthritis, unspecified site: Secondary | ICD-10-CM | POA: Diagnosis not present

## 2019-02-01 DIAGNOSIS — H548 Legal blindness, as defined in USA: Secondary | ICD-10-CM | POA: Diagnosis not present

## 2019-02-01 DIAGNOSIS — Z9181 History of falling: Secondary | ICD-10-CM | POA: Diagnosis not present

## 2019-02-01 DIAGNOSIS — I10 Essential (primary) hypertension: Secondary | ICD-10-CM | POA: Diagnosis not present

## 2019-02-01 DIAGNOSIS — I2510 Atherosclerotic heart disease of native coronary artery without angina pectoris: Secondary | ICD-10-CM | POA: Diagnosis not present

## 2019-02-01 DIAGNOSIS — Z955 Presence of coronary angioplasty implant and graft: Secondary | ICD-10-CM | POA: Diagnosis not present

## 2019-02-02 DIAGNOSIS — E1165 Type 2 diabetes mellitus with hyperglycemia: Secondary | ICD-10-CM | POA: Diagnosis not present

## 2019-02-02 DIAGNOSIS — I2510 Atherosclerotic heart disease of native coronary artery without angina pectoris: Secondary | ICD-10-CM | POA: Diagnosis not present

## 2019-02-02 DIAGNOSIS — J45909 Unspecified asthma, uncomplicated: Secondary | ICD-10-CM | POA: Diagnosis not present

## 2019-02-02 DIAGNOSIS — Z9181 History of falling: Secondary | ICD-10-CM | POA: Diagnosis not present

## 2019-02-02 DIAGNOSIS — J209 Acute bronchitis, unspecified: Secondary | ICD-10-CM | POA: Diagnosis not present

## 2019-02-02 DIAGNOSIS — Z955 Presence of coronary angioplasty implant and graft: Secondary | ICD-10-CM | POA: Diagnosis not present

## 2019-02-02 DIAGNOSIS — Z794 Long term (current) use of insulin: Secondary | ICD-10-CM | POA: Diagnosis not present

## 2019-02-02 DIAGNOSIS — M130 Polyarthritis, unspecified: Secondary | ICD-10-CM | POA: Diagnosis not present

## 2019-02-02 DIAGNOSIS — Z87891 Personal history of nicotine dependence: Secondary | ICD-10-CM | POA: Diagnosis not present

## 2019-02-02 DIAGNOSIS — M1990 Unspecified osteoarthritis, unspecified site: Secondary | ICD-10-CM | POA: Diagnosis not present

## 2019-02-02 DIAGNOSIS — H548 Legal blindness, as defined in USA: Secondary | ICD-10-CM | POA: Diagnosis not present

## 2019-02-02 DIAGNOSIS — E119 Type 2 diabetes mellitus without complications: Secondary | ICD-10-CM | POA: Diagnosis not present

## 2019-02-02 DIAGNOSIS — I10 Essential (primary) hypertension: Secondary | ICD-10-CM | POA: Diagnosis not present

## 2019-02-03 ENCOUNTER — Other Ambulatory Visit: Payer: Self-pay

## 2019-02-03 DIAGNOSIS — Z955 Presence of coronary angioplasty implant and graft: Secondary | ICD-10-CM | POA: Diagnosis not present

## 2019-02-03 DIAGNOSIS — M1990 Unspecified osteoarthritis, unspecified site: Secondary | ICD-10-CM | POA: Diagnosis not present

## 2019-02-03 DIAGNOSIS — Z794 Long term (current) use of insulin: Secondary | ICD-10-CM | POA: Diagnosis not present

## 2019-02-03 DIAGNOSIS — I10 Essential (primary) hypertension: Secondary | ICD-10-CM | POA: Diagnosis not present

## 2019-02-03 DIAGNOSIS — H548 Legal blindness, as defined in USA: Secondary | ICD-10-CM | POA: Diagnosis not present

## 2019-02-03 DIAGNOSIS — Z87891 Personal history of nicotine dependence: Secondary | ICD-10-CM | POA: Diagnosis not present

## 2019-02-03 DIAGNOSIS — E119 Type 2 diabetes mellitus without complications: Secondary | ICD-10-CM | POA: Diagnosis not present

## 2019-02-03 DIAGNOSIS — Z9181 History of falling: Secondary | ICD-10-CM | POA: Diagnosis not present

## 2019-02-03 DIAGNOSIS — J45909 Unspecified asthma, uncomplicated: Secondary | ICD-10-CM | POA: Diagnosis not present

## 2019-02-03 DIAGNOSIS — I2510 Atherosclerotic heart disease of native coronary artery without angina pectoris: Secondary | ICD-10-CM | POA: Diagnosis not present

## 2019-02-03 NOTE — Patient Outreach (Signed)
[facility]) Care Management  02/03/2019  Eddie Graves [birth year] [id number]   Telephone Screen  Referral Date: 02/03/2019 Referral Source: [facility] Dept-Urgent Referral Reason: " legally blind, lives alone, daughter and son checks on him, glucose and BP machine not working" Insurance: [employer]   Outreach attempt # 1 to patient at multiple [phone]- no answer, [phone] listed on referral)-wrong number, [phone] answer-voicemail stating to call [phone] to reach [name]. RN CM attempted that number and not in service.       Plan: RN CM will make outreach attempt to patient within 3-4 business days. RN CM will send unsuccessful outreach letter to patient.  Antionette Fairy, RN,BSN,CCM [facility] Care Management Telephonic Care Management Coordinator Direct Phone: [phone] Toll Free: [phone] Fax: [phone]

## 2019-02-06 ENCOUNTER — Encounter: Payer: Medicare Other | Attending: Family Medicine | Admitting: Psychology

## 2019-02-06 ENCOUNTER — Other Ambulatory Visit: Payer: Self-pay

## 2019-02-06 DIAGNOSIS — F331 Major depressive disorder, recurrent, moderate: Secondary | ICD-10-CM | POA: Insufficient documentation

## 2019-02-06 DIAGNOSIS — Z833 Family history of diabetes mellitus: Secondary | ICD-10-CM | POA: Insufficient documentation

## 2019-02-06 DIAGNOSIS — E119 Type 2 diabetes mellitus without complications: Secondary | ICD-10-CM | POA: Insufficient documentation

## 2019-02-06 DIAGNOSIS — Z794 Long term (current) use of insulin: Secondary | ICD-10-CM | POA: Diagnosis not present

## 2019-02-06 DIAGNOSIS — R413 Other amnesia: Secondary | ICD-10-CM | POA: Insufficient documentation

## 2019-02-06 DIAGNOSIS — F419 Anxiety disorder, unspecified: Secondary | ICD-10-CM | POA: Insufficient documentation

## 2019-02-06 DIAGNOSIS — Z87891 Personal history of nicotine dependence: Secondary | ICD-10-CM | POA: Diagnosis not present

## 2019-02-06 DIAGNOSIS — I2510 Atherosclerotic heart disease of native coronary artery without angina pectoris: Secondary | ICD-10-CM | POA: Diagnosis not present

## 2019-02-06 DIAGNOSIS — J45909 Unspecified asthma, uncomplicated: Secondary | ICD-10-CM | POA: Diagnosis not present

## 2019-02-06 DIAGNOSIS — I10 Essential (primary) hypertension: Secondary | ICD-10-CM | POA: Insufficient documentation

## 2019-02-06 DIAGNOSIS — M1990 Unspecified osteoarthritis, unspecified site: Secondary | ICD-10-CM | POA: Diagnosis not present

## 2019-02-06 DIAGNOSIS — Z955 Presence of coronary angioplasty implant and graft: Secondary | ICD-10-CM | POA: Diagnosis not present

## 2019-02-06 DIAGNOSIS — Z809 Family history of malignant neoplasm, unspecified: Secondary | ICD-10-CM | POA: Insufficient documentation

## 2019-02-06 DIAGNOSIS — M48062 Spinal stenosis, lumbar region with neurogenic claudication: Secondary | ICD-10-CM | POA: Insufficient documentation

## 2019-02-06 DIAGNOSIS — Z9181 History of falling: Secondary | ICD-10-CM | POA: Diagnosis not present

## 2019-02-06 DIAGNOSIS — H548 Legal blindness, as defined in USA: Secondary | ICD-10-CM | POA: Diagnosis not present

## 2019-02-06 NOTE — Patient Outreach (Addendum)
[facility]) Care Management  02/06/2019  Eddie Graves 13-Aug-1931 [id number]   Telephone Screen  Referral Date: 02/03/2019 Referral Source: [facility] Dept-Urgent Referral Reason: " legally blind, lives alone, daughter and son checks on him, glucose and BP machine not working" Insurance: [employer]   Outreach attempt #2 to patient. Spoke with son who provided patient contact number is [phone]. RN CM attempted patient at number listed. Spoke with patient. He lives alone. He states he has two children that are not very involved. He reports that he has someone that comes in 2x/wk to help him bathe but he does not know agency. He voices he could use some in home assistance. He states that he was legally blind but now is completely blind and use blind cane. He does not have reliable transportation. Patient poor historian and not able to give and recall a lot of info.He voices that he is "confused about a lot of things and needs help." Patient has PMH of DM, anxiety, HTN, COPD, obesity and CAD. He voices that his talking meter does not work nor does his BP cuff. He is not managing his medical conditions well due to his blindness. He is taking 30 units of insulin 2x/day without knowing what his blood sugar is. Patient states that he can tell when his cbg drops or gets to high.He voices that he is taking about 10 meds. He reports that his meds are delivered from pharmacy on 30 day supply. He has trouble seeing and taking meds. Patient states he has trouble paying for diabetic supplies and insulin. He is requesting assistance. [facility] services reviewed and discussed with patient. Verbal consent received from patient.      Plan: RN CM will send [facility] SW referral for possible in home support/assistance and transportation resources. RN CM will send [place] community referral for further assistance with disease mgmt and  RN CM will send [facility] pharmacy referral for possible med assistance and med  mgmt.   Antionette Fairy, RN,BSN,CCM [facility] Care Management Telephonic Care Management Coordinator Direct Phone: [phone] Toll Free: [phone] Fax: [phone]

## 2019-02-07 ENCOUNTER — Telehealth: Payer: Self-pay | Admitting: Pharmacist

## 2019-02-07 DIAGNOSIS — Z87891 Personal history of nicotine dependence: Secondary | ICD-10-CM | POA: Diagnosis not present

## 2019-02-07 DIAGNOSIS — I10 Essential (primary) hypertension: Secondary | ICD-10-CM | POA: Diagnosis not present

## 2019-02-07 DIAGNOSIS — M1990 Unspecified osteoarthritis, unspecified site: Secondary | ICD-10-CM | POA: Diagnosis not present

## 2019-02-07 DIAGNOSIS — Z794 Long term (current) use of insulin: Secondary | ICD-10-CM | POA: Diagnosis not present

## 2019-02-07 DIAGNOSIS — I2510 Atherosclerotic heart disease of native coronary artery without angina pectoris: Secondary | ICD-10-CM | POA: Diagnosis not present

## 2019-02-07 DIAGNOSIS — J45909 Unspecified asthma, uncomplicated: Secondary | ICD-10-CM | POA: Diagnosis not present

## 2019-02-07 DIAGNOSIS — H548 Legal blindness, as defined in USA: Secondary | ICD-10-CM | POA: Diagnosis not present

## 2019-02-07 DIAGNOSIS — Z955 Presence of coronary angioplasty implant and graft: Secondary | ICD-10-CM | POA: Diagnosis not present

## 2019-02-07 DIAGNOSIS — Z9181 History of falling: Secondary | ICD-10-CM | POA: Diagnosis not present

## 2019-02-07 DIAGNOSIS — E119 Type 2 diabetes mellitus without complications: Secondary | ICD-10-CM | POA: Diagnosis not present

## 2019-02-07 NOTE — Patient Outreach (Signed)
[facility]) Care Management  [facility] Pharmacy   02/08/2019  Eddie Graves 1930-12-13 [id number]  Reason for referral:  Medication assistance/Medication Management  Referral source: [facility] Nurse Referral medication(s): all Current insurance:[employer]  HPI: Patient is an 85 year old male with multiple medical conditions including but not limited to:  CKD stage III, COPD, depression, hypertesnion, blindness, tobacco abuse, seizure disorder, history of subarachnoid hemmorahge, uncontrolled type 2 diabetes and vitamin D deficiency.  Spoke with patient and his son via telelphone. HIPAA identifiers were obtained.  Objective: Allergies  Allergen Reactions  . Flexeril [Cyclobenzaprine] Other (See Comments)    delirium  . Nsaids Other (See Comments)    Renal failure    Medications Reviewed Today    Reviewed by [name], [facility] (Pharmacist) on 02/08/19 at 2310  Med List Status: <None>  Medication Order Taking? Sig Documenting Provider Last Dose Status Informant  amLODipine (NORVASC) 10 MG tablet [id number] Yes Take 5 mg by mouth daily.  Rinaldo Cloud, MD Taking Active   aspirin EC 81 MG tablet [id number] Yes 1/2 tablet by mouth daily per Cardiologist Bufford Spikes L, DO Taking Active Child  Cholecalciferol (VITAMIN D3) 2000 units capsule [id number] Yes TAKE 1 CAPSULE (2,000 UNITS TOTAL) BY MOUTH DAILY. FOR VITAMIN DAY REPLETION Reed, Tiffany L, DO Taking Active   clopidogrel (PLAVIX) 75 MG tablet [id number] Yes Take 1 tablet (75 mg total) by mouth daily with breakfast. Rinaldo Cloud, MD Taking Active   CRESTOR 20 MG tablet [id number] Yes Take 20 mg by mouth daily. For cholesterol [provider] Taking Active Child           Med Note Horald Pollen Oct 11, 2014 10:05 PM) .  diclofenac sodium (VOLTAREN) 1 % GEL [id number] Yes Apply to affected joints four times daily [provider] Taking Active   doxycycline (VIBRAMYCIN) 100 MG capsule  [id number] Yes Take 1 capsule (100 mg total) by mouth 2 (two) times daily. Gilda Crease, MD Taking Active   esomeprazole (NEXIUM) 40 MG capsule [id number] Yes TAKE ONE CAPSULE BY MOUTH ONCE DAILY BEFORE BREAKFAST FOR STOMACH Reed, Tiffany L, DO Taking Active   gabapentin (NEURONTIN) 300 MG capsule [id number] Yes Take 1 capsule by mouth 3 (three) times daily. Rinaldo Cloud, MD Taking Active   GARLIC PO [id number]  Take 1,000 mcg by mouth daily.  [provider]  Active Child  glucose blood (PRODIGY NO CODING BLOOD GLUC) test strip [id number] Yes PRODIGY TEST STRIPS, CHECK BLOOD SUGAR TWICE DAILY DX: E11.65, H54.7 PATIENT WITH BLINDNESS AND NEEDS STRIPS WITH READ ALOUD MONITOR Reed, Tiffany L, DO Taking Active   Insulin Lispro Prot & Lispro ([name] 75/25 KWIKPEN) (75-25) 100 UNIT/ML Stephanie Coup [id number] Yes Inject 40 Units into the skin 2 (two) times daily with a meal. Reed, Tiffany L, DO Taking Active   levalbuterol (XOPENEX) 1.25 MG/3ML nebulizer solution [id number] Yes TAKE 1.25 MG (ONE AMPULE) BY NEBULIZATION EVERY 4 (FOUR) HOURS AS NEEDED FOR WHEEZING. Reed, Tiffany L, DO Taking Active   lipase/protease/amylase (CREON) 36000 UNITS CPEP capsule [id number] No Two capsules twice a day with meals and one capsule with snack Reed, Tiffany L, DO Unknown Active   lisinopril (PRINIVIL,ZESTRIL) 20 MG tablet [id number] Yes TAKE 1 TABLET (20 MG TOTAL) BY MOUTH DAILY Reed, Tiffany L, DO Taking Active   metoprolol succinate (TOPROL-XL) 100 MG 24 hr tablet [id number] Yes Take one tablet by mouth once daily for blood pressure Reed, Tiffany L,  DO Taking Active Child  mometasone-formoterol (DULERA) 200-5 MCG/ACT AERO [id number] No Inhale 2 puffs into the lungs 2 (two) times daily. Rinaldo CloudHarwani, Mohan, MD Unknown Active   nitroGLYCERIN (NITRODUR - DOSED IN MG/24 HR) 0.4 mg/hr patch [id number] Yes Apply 1 patch to the skin daily [provider] Taking Active   nitroGLYCERIN (NITROSTAT) 0.4 MG SL tablet  [id number] Yes Place 1 tablet (0.4 mg total) under the tongue every 5 (five) minutes as needed for chest pain. Rinaldo CloudHarwani, Mohan, MD Taking Active   Omega 3 1200 MG CAPS [id number] Yes Take 2,400 mg by mouth daily. For cholesterol [provider] Taking Active Child  predniSONE (DELTASONE) 20 MG tablet [id number] Yes Take 2 tablets (40 mg total) by mouth daily with breakfast. Gilda CreasePollina, Christopher J, MD Taking Active   PROAIR HFA 108 [phone](90 Base) MCG/ACT inhaler [id number] Yes INHALE TWO PUFFS INTO THE LUNGS EVERY TWO HOURS IF NEEDED Reed, Tiffany L, DO Taking Active   sertraline (ZOLOFT) 50 MG tablet [id number] Yes TAKE ONE TABLET BY MOUTH ONCE DAILY Reed, Tiffany L, DO Taking Active   tiotropium (SPIRIVA) 18 MCG inhalation capsule [id number] Yes Place 1 capsule (18 mcg total) into inhaler and inhale daily. Rinaldo CloudHarwani, Mohan, MD Taking Active   TRADJENTA 5 MG TABS tablet [id number] Yes TAKE ONE TABLET BY MOUTH ONCE DAILY TO CONTROL BLOOD SUGAR Renato Gailseed, Tiffany L, DO Taking Active           Assessment: Reviewed patient's medications with his pharmacy over the phone as the patient is blind and cannot see. [facility] places the patient's medications in weekly pill boxes to help with adherence.   Drugs sorted by system:  Neurologic/Psychologic: Gabapentin, Sertraline  Cardiovascular: Amlodipine, Aspirin, Clopidogrel, Rosuvastatin, Lisinopril, Metoprolol, Nitroglycerin SL, Nitroglycerin Patch,   Pulmonary/Allergy: Levalbuterol nebulizer solution, Dulera, Prednisone, ProAir, Spiriva  Gastrointestinal: Esomeprazole, Creon (patient unsure about will recall pharmacy)  Endocrine: Humalog 70/30, Trajenta  Topical: Diclofenac Gel  Infectious Diseases: Doxycycline  Vitamins/Minerals/Supplements: Cholecalciferol, Omega 3 Fatty Acids, Garlic   Medication Review Findings:  . Creon-patient could not remember if he was taking this. Old fill history in [employer] listed 2018. The pharmacy  did not mention Creon when getting his fill history. Will call pharmacy back tomorrow to clarify.  Medication Assistance Findings:  No medication assistance needs identified  Patient has Full Extra Help  Since he has full Extra Help, he is not eligible for patient assistance programs.  [facility] is putting his medications in a weekly pill box.   Additional medication assistance options reviewed with patient as warranted:  No other options identified  [facility] and was not aware of the OTC catalog. He is blind and cannot read the catalog. I called his son back to see if I could send it to him but he did not answer.  Plan: Follow up with the pharmacy about Creon then update the patient's medication list.  Beecher McardleKatina J. Elester Apodaca, PharmD, [facility]ACP [facility] Clinical Pharmacist [phone]

## 2019-02-09 ENCOUNTER — Other Ambulatory Visit: Payer: Self-pay | Admitting: *Deleted

## 2019-02-09 ENCOUNTER — Ambulatory Visit: Payer: Self-pay | Admitting: Pharmacist

## 2019-02-09 ENCOUNTER — Other Ambulatory Visit: Payer: Self-pay | Admitting: Pharmacist

## 2019-02-09 DIAGNOSIS — Z9181 History of falling: Secondary | ICD-10-CM | POA: Diagnosis not present

## 2019-02-09 DIAGNOSIS — Z87891 Personal history of nicotine dependence: Secondary | ICD-10-CM | POA: Diagnosis not present

## 2019-02-09 DIAGNOSIS — M1990 Unspecified osteoarthritis, unspecified site: Secondary | ICD-10-CM | POA: Diagnosis not present

## 2019-02-09 DIAGNOSIS — I2510 Atherosclerotic heart disease of native coronary artery without angina pectoris: Secondary | ICD-10-CM | POA: Diagnosis not present

## 2019-02-09 DIAGNOSIS — Z955 Presence of coronary angioplasty implant and graft: Secondary | ICD-10-CM | POA: Diagnosis not present

## 2019-02-09 DIAGNOSIS — Z794 Long term (current) use of insulin: Secondary | ICD-10-CM | POA: Diagnosis not present

## 2019-02-09 DIAGNOSIS — H548 Legal blindness, as defined in USA: Secondary | ICD-10-CM | POA: Diagnosis not present

## 2019-02-09 DIAGNOSIS — E119 Type 2 diabetes mellitus without complications: Secondary | ICD-10-CM | POA: Diagnosis not present

## 2019-02-09 DIAGNOSIS — I10 Essential (primary) hypertension: Secondary | ICD-10-CM | POA: Diagnosis not present

## 2019-02-09 DIAGNOSIS — J45909 Unspecified asthma, uncomplicated: Secondary | ICD-10-CM | POA: Diagnosis not present

## 2019-02-09 NOTE — Patient Outreach (Signed)
[facility]) Care Management  02/09/2019  Eddie Graves [birth year] [id number]   Referral received from telephonic care manager requesting home assessment for management of chronic medical conditions.  He is blind and has history of hypertension, heart failure, diabetes, COPD, CKD, and memory loss.  This care manager is familiar with member as he was active in the past.  Son used to live with him but has recently moved out.  Call placed to son, Eddie Graves, to follow up on referral.  Member's identity verified.  He state that he and his sister are both concerned about member ability to live alone and adequately care for himself.  Report they have had discussions about member moving in with one of them but he has refused.  He feels assessment of member it the home is needed, but only if member agrees.  Spoke with member, he denies any urgent concerns at this time.  Agrees to home visit next week, also has visit with PCP next week.  Denies need for transportation.  Will follow up with home visit and assessment of needs next week.  Kemper Durie, [place], MSN [facility] Care Management  [facility] Manager [phone]

## 2019-02-09 NOTE — Patient Outreach (Addendum)
[facility]) Care Management  02/09/2019  Eddie Graves 04-29-1931 [id number]   [facility] was called to verify the patient has not had Creon filled. (Patient could not remember when I spoke to him earlier this week and he is blind)  The pharmacy fills weekly pill boxes for him.  Spoke with the patient's son at his father's request. HIPAA identifiers were obtained. Patient's son was sent the [facility] OTC Catalog.   Plan: Call patient back in 2-3 weeks to see if he or his son need assistance ordering from the catalog or have additional pharmacy questions.   [name], PharmD, BCACP [facility] Clinical Pharmacist [phone]

## 2019-02-10 ENCOUNTER — Other Ambulatory Visit: Payer: Self-pay

## 2019-02-10 NOTE — Patient Outreach (Signed)
[facility]) Care Management  02/10/2019  Eddie Graves 09-08-1931 [id number]   Unsuccessful outreach to patient regarding social work referral.  Attempted to reach patient at home number listed but went straight to voicemail.  BSW was able to contact patient's son who had just left patient's home. He reported that patient's phone was dead and he was trying to locate a Consulting civil engineer.  BSW will attempt to reach him again within four business days.  Malachy Chamber, BSW Social Worker [phone]

## 2019-02-13 DIAGNOSIS — H548 Legal blindness, as defined in USA: Secondary | ICD-10-CM | POA: Diagnosis not present

## 2019-02-13 DIAGNOSIS — M1990 Unspecified osteoarthritis, unspecified site: Secondary | ICD-10-CM | POA: Diagnosis not present

## 2019-02-13 DIAGNOSIS — Z794 Long term (current) use of insulin: Secondary | ICD-10-CM | POA: Diagnosis not present

## 2019-02-13 DIAGNOSIS — E119 Type 2 diabetes mellitus without complications: Secondary | ICD-10-CM | POA: Diagnosis not present

## 2019-02-13 DIAGNOSIS — J45909 Unspecified asthma, uncomplicated: Secondary | ICD-10-CM | POA: Diagnosis not present

## 2019-02-13 DIAGNOSIS — Z87891 Personal history of nicotine dependence: Secondary | ICD-10-CM | POA: Diagnosis not present

## 2019-02-13 DIAGNOSIS — I10 Essential (primary) hypertension: Secondary | ICD-10-CM | POA: Diagnosis not present

## 2019-02-13 DIAGNOSIS — I2510 Atherosclerotic heart disease of native coronary artery without angina pectoris: Secondary | ICD-10-CM | POA: Diagnosis not present

## 2019-02-13 DIAGNOSIS — Z955 Presence of coronary angioplasty implant and graft: Secondary | ICD-10-CM | POA: Diagnosis not present

## 2019-02-13 DIAGNOSIS — Z9181 History of falling: Secondary | ICD-10-CM | POA: Diagnosis not present

## 2019-02-14 ENCOUNTER — Other Ambulatory Visit: Payer: Self-pay

## 2019-02-14 ENCOUNTER — Ambulatory Visit: Payer: Medicare Other | Admitting: *Deleted

## 2019-02-14 DIAGNOSIS — Z87891 Personal history of nicotine dependence: Secondary | ICD-10-CM | POA: Diagnosis not present

## 2019-02-14 DIAGNOSIS — J45909 Unspecified asthma, uncomplicated: Secondary | ICD-10-CM | POA: Diagnosis not present

## 2019-02-14 DIAGNOSIS — I2510 Atherosclerotic heart disease of native coronary artery without angina pectoris: Secondary | ICD-10-CM | POA: Diagnosis not present

## 2019-02-14 DIAGNOSIS — Z955 Presence of coronary angioplasty implant and graft: Secondary | ICD-10-CM | POA: Diagnosis not present

## 2019-02-14 DIAGNOSIS — Z794 Long term (current) use of insulin: Secondary | ICD-10-CM | POA: Diagnosis not present

## 2019-02-14 DIAGNOSIS — Z9181 History of falling: Secondary | ICD-10-CM | POA: Diagnosis not present

## 2019-02-14 DIAGNOSIS — H548 Legal blindness, as defined in USA: Secondary | ICD-10-CM | POA: Diagnosis not present

## 2019-02-14 DIAGNOSIS — I10 Essential (primary) hypertension: Secondary | ICD-10-CM | POA: Diagnosis not present

## 2019-02-14 DIAGNOSIS — M1990 Unspecified osteoarthritis, unspecified site: Secondary | ICD-10-CM | POA: Diagnosis not present

## 2019-02-14 DIAGNOSIS — E119 Type 2 diabetes mellitus without complications: Secondary | ICD-10-CM | POA: Diagnosis not present

## 2019-02-14 NOTE — Patient Outreach (Signed)
[facility]) Care Management  02/14/2019  Eddie Graves [birth year] [id number]   Successful outreach to patient regarding social work referral for in-home support and information on services for the blind.   Patient reports that son used to live with him until recently getting married. Patient currently lives alone. Patient reported that he is currently receiving in-home services but could not remember the particular service or agency.  Based on description, it seems that he is receiving physical therapy.  BSW contacted patient's son after completion of call with patient to inquire about services.  His son confirmed that he is receiving physical therapy and said he thinks it is with [facility] but he was not certain. Patient reports needing assistance in the home due to being blind.  He has difficulty managing his medications and completing day to day tasks.  Patient reports applying for Medicaid multiple time but getting denied each time.  Patient's son also confirmed this.   BSW informed him that in-home aide services would have to be paid for out-of-pocket due to insurance.  Patient is unable to afford this.  BSW talked with him about [facility] and he agreed to a referral being made.  BSW left voicemail message for [facility] with DSB; will follow up with patient after referral for services has been made.   Malachy Chamber, BSW Social Worker [phone]

## 2019-02-15 DIAGNOSIS — I10 Essential (primary) hypertension: Secondary | ICD-10-CM | POA: Diagnosis not present

## 2019-02-15 DIAGNOSIS — E1169 Type 2 diabetes mellitus with other specified complication: Secondary | ICD-10-CM | POA: Diagnosis not present

## 2019-02-16 ENCOUNTER — Ambulatory Visit: Payer: Self-pay | Admitting: *Deleted

## 2019-02-16 ENCOUNTER — Other Ambulatory Visit: Payer: Self-pay | Admitting: *Deleted

## 2019-02-16 DIAGNOSIS — Z794 Long term (current) use of insulin: Secondary | ICD-10-CM | POA: Diagnosis not present

## 2019-02-16 DIAGNOSIS — H548 Legal blindness, as defined in USA: Secondary | ICD-10-CM | POA: Diagnosis not present

## 2019-02-16 DIAGNOSIS — I10 Essential (primary) hypertension: Secondary | ICD-10-CM | POA: Diagnosis not present

## 2019-02-16 DIAGNOSIS — Z87891 Personal history of nicotine dependence: Secondary | ICD-10-CM | POA: Diagnosis not present

## 2019-02-16 DIAGNOSIS — Z955 Presence of coronary angioplasty implant and graft: Secondary | ICD-10-CM | POA: Diagnosis not present

## 2019-02-16 DIAGNOSIS — Z9181 History of falling: Secondary | ICD-10-CM | POA: Diagnosis not present

## 2019-02-16 DIAGNOSIS — I2510 Atherosclerotic heart disease of native coronary artery without angina pectoris: Secondary | ICD-10-CM | POA: Diagnosis not present

## 2019-02-16 DIAGNOSIS — E119 Type 2 diabetes mellitus without complications: Secondary | ICD-10-CM | POA: Diagnosis not present

## 2019-02-16 DIAGNOSIS — M1990 Unspecified osteoarthritis, unspecified site: Secondary | ICD-10-CM | POA: Diagnosis not present

## 2019-02-16 DIAGNOSIS — J45909 Unspecified asthma, uncomplicated: Secondary | ICD-10-CM | POA: Diagnosis not present

## 2019-02-16 NOTE — Patient Outreach (Signed)
[facility]) Care Management  02/16/2019  Syeed Rathgeber August 10, 1931 [id number]   Member scheduled for home visit today.  This care manager unable to keep visit due to computer/network issues.  Call placed to member to inform of need to reschedule.  Agrees to reschedule for next week.  Denies any urgent concerns at this time.  Kemper Durie, [place], MSN [facility] Care Management  [facility] Manager [phone]

## 2019-02-18 DIAGNOSIS — Z87891 Personal history of nicotine dependence: Secondary | ICD-10-CM | POA: Diagnosis not present

## 2019-02-18 DIAGNOSIS — J45909 Unspecified asthma, uncomplicated: Secondary | ICD-10-CM | POA: Diagnosis not present

## 2019-02-18 DIAGNOSIS — Z955 Presence of coronary angioplasty implant and graft: Secondary | ICD-10-CM | POA: Diagnosis not present

## 2019-02-18 DIAGNOSIS — E119 Type 2 diabetes mellitus without complications: Secondary | ICD-10-CM | POA: Diagnosis not present

## 2019-02-18 DIAGNOSIS — H548 Legal blindness, as defined in USA: Secondary | ICD-10-CM | POA: Diagnosis not present

## 2019-02-18 DIAGNOSIS — Z9181 History of falling: Secondary | ICD-10-CM | POA: Diagnosis not present

## 2019-02-18 DIAGNOSIS — I10 Essential (primary) hypertension: Secondary | ICD-10-CM | POA: Diagnosis not present

## 2019-02-18 DIAGNOSIS — Z794 Long term (current) use of insulin: Secondary | ICD-10-CM | POA: Diagnosis not present

## 2019-02-18 DIAGNOSIS — I2510 Atherosclerotic heart disease of native coronary artery without angina pectoris: Secondary | ICD-10-CM | POA: Diagnosis not present

## 2019-02-18 DIAGNOSIS — M1990 Unspecified osteoarthritis, unspecified site: Secondary | ICD-10-CM | POA: Diagnosis not present

## 2019-02-21 ENCOUNTER — Other Ambulatory Visit: Payer: Self-pay | Admitting: *Deleted

## 2019-02-21 ENCOUNTER — Other Ambulatory Visit: Payer: Self-pay

## 2019-02-21 DIAGNOSIS — Z794 Long term (current) use of insulin: Secondary | ICD-10-CM | POA: Diagnosis not present

## 2019-02-21 DIAGNOSIS — Z9181 History of falling: Secondary | ICD-10-CM | POA: Diagnosis not present

## 2019-02-21 DIAGNOSIS — M1990 Unspecified osteoarthritis, unspecified site: Secondary | ICD-10-CM | POA: Diagnosis not present

## 2019-02-21 DIAGNOSIS — E119 Type 2 diabetes mellitus without complications: Secondary | ICD-10-CM | POA: Diagnosis not present

## 2019-02-21 DIAGNOSIS — Z955 Presence of coronary angioplasty implant and graft: Secondary | ICD-10-CM | POA: Diagnosis not present

## 2019-02-21 DIAGNOSIS — I10 Essential (primary) hypertension: Secondary | ICD-10-CM | POA: Diagnosis not present

## 2019-02-21 DIAGNOSIS — H548 Legal blindness, as defined in USA: Secondary | ICD-10-CM | POA: Diagnosis not present

## 2019-02-21 DIAGNOSIS — I2510 Atherosclerotic heart disease of native coronary artery without angina pectoris: Secondary | ICD-10-CM | POA: Diagnosis not present

## 2019-02-21 DIAGNOSIS — Z87891 Personal history of nicotine dependence: Secondary | ICD-10-CM | POA: Diagnosis not present

## 2019-02-21 DIAGNOSIS — J45909 Unspecified asthma, uncomplicated: Secondary | ICD-10-CM | POA: Diagnosis not present

## 2019-02-21 NOTE — Patient Outreach (Signed)
[facility]) Care Management  02/21/2019  Eddie Graves 1931/09/13 [id number]   Member scheduled for home visit today.  Assessment will be done telephonically as home visits are restricted at this time due to the threat of Covid-19.  Call placed to member, he reports he is doing well.  He lives alone but has son and daughter that visit often.  Report son was living with until he get married about 8 months ago.    Per referral, there is concern about member's medication management as well as assistance with getting his blood pressure machine and glucose meter working.  Member report his daughter is able to check both his blood pressure and glucose when she comes over (glucose was 118 today) therefore machines are working.  He is unable to use them properly due to his increasing forgetfulness and blindness.    Discussed member's medication management, state he get them prepackaged from pharmacy, but still unsure if he is taking the right medications.  Noted that [facility] pharmacist is active with case and working with son.  This care manager expressed concern regarding member's ability to stay home alone and care for himself.  State he is not willing to go to ALF nor is he considering moving in with family (which per son has been offered).  State "I worked hard to pay for this house and I'm going to stay in it."  BSW working with member/son in attempt to provide resources to increase support in the home.  He is not eligible for Medicaid and state he is not able to pay out of pocket for services.    No nursing needs identified, will not open to nursing at this time but will remain available to [facility] Pharmacist for concerns.  Member denies any questions at this time.  Kemper Durie, [place], MSN [facility] Care Management  [facility] Manager [phone]

## 2019-02-22 ENCOUNTER — Encounter: Payer: Self-pay | Admitting: *Deleted

## 2019-02-28 ENCOUNTER — Other Ambulatory Visit: Payer: Self-pay

## 2019-02-28 NOTE — Patient Outreach (Signed)
[facility]) Care Management  02/28/2019  Eddie Graves Oct 07, 1931 [id number]   Unsuccessful outreach to patient regarding information gathered about [facility].  Unable to submit referral for services without name and contact information for patient's eye doctor; although the division does not provide in-home services that patient is requesting.  BSW will attempt to reach again within four business days.  Malachy Chamber, BSW Social Worker [phone]

## 2019-03-01 DIAGNOSIS — M1990 Unspecified osteoarthritis, unspecified site: Secondary | ICD-10-CM | POA: Diagnosis not present

## 2019-03-01 DIAGNOSIS — Z955 Presence of coronary angioplasty implant and graft: Secondary | ICD-10-CM | POA: Diagnosis not present

## 2019-03-01 DIAGNOSIS — Z9181 History of falling: Secondary | ICD-10-CM | POA: Diagnosis not present

## 2019-03-01 DIAGNOSIS — Z87891 Personal history of nicotine dependence: Secondary | ICD-10-CM | POA: Diagnosis not present

## 2019-03-01 DIAGNOSIS — H548 Legal blindness, as defined in USA: Secondary | ICD-10-CM | POA: Diagnosis not present

## 2019-03-01 DIAGNOSIS — J45909 Unspecified asthma, uncomplicated: Secondary | ICD-10-CM | POA: Diagnosis not present

## 2019-03-01 DIAGNOSIS — Z794 Long term (current) use of insulin: Secondary | ICD-10-CM | POA: Diagnosis not present

## 2019-03-01 DIAGNOSIS — E119 Type 2 diabetes mellitus without complications: Secondary | ICD-10-CM | POA: Diagnosis not present

## 2019-03-01 DIAGNOSIS — I10 Essential (primary) hypertension: Secondary | ICD-10-CM | POA: Diagnosis not present

## 2019-03-01 DIAGNOSIS — I2510 Atherosclerotic heart disease of native coronary artery without angina pectoris: Secondary | ICD-10-CM | POA: Diagnosis not present

## 2019-03-02 ENCOUNTER — Other Ambulatory Visit: Payer: Self-pay | Admitting: Pharmacist

## 2019-03-02 ENCOUNTER — Other Ambulatory Visit: Payer: Self-pay

## 2019-03-02 ENCOUNTER — Ambulatory Visit: Payer: Self-pay

## 2019-03-02 ENCOUNTER — Ambulatory Visit: Payer: Self-pay | Admitting: Pharmacist

## 2019-03-02 NOTE — Patient Outreach (Signed)
[facility]) Care Management  03/02/2019  Eddie Graves [birth year] [id number]   Patient was called to follow up on the OTC Catalog that was sent to his son.  HIPAA identifiers were obtained.  Patient confirmed his son received the catalog but has not brought it to him.  Patient was asked about necessities with the COVID-19 lock down precautions and he said he has everything he needs.   Patient receives weekly pre-filled pill boxes from [employer].  [name], PharmD, BCACP [facility] Clinical Pharmacist [phone]   Plan: Call patient back in 1 month just to check on him. Consider closing case.

## 2019-03-02 NOTE — Patient Outreach (Signed)
[facility]) Care Management  03/02/2019  Eddie Graves November 01, 1931 [id number]   Successful follow up call to patient to discuss possible referral to [facility].  BSW informed patient that the division does not provide in-home aide services as he is requesting.  BSW informed him that a referral can still be submitted, however, because there may be other services that could be of benefit.  BSW informed him that the division requires documentation from most recent eye doctor in order to proceed with referral.  Unfortunately, patient is not aware of last provider because it has been "several years" since he has been.  BSW agreed to contact patient's son to determine if he has this information.  BSW attempted to contact son but was unable to leave voicemail message due to mailbox being full.  BSW will attempt to reach again within four business days.  Malachy Chamber, BSW Social Worker [phone]

## 2019-03-06 ENCOUNTER — Ambulatory Visit: Payer: Self-pay

## 2019-03-07 ENCOUNTER — Ambulatory Visit: Payer: Self-pay

## 2019-03-07 ENCOUNTER — Other Ambulatory Visit: Payer: Self-pay

## 2019-03-07 DIAGNOSIS — E119 Type 2 diabetes mellitus without complications: Secondary | ICD-10-CM | POA: Diagnosis not present

## 2019-03-07 DIAGNOSIS — J45909 Unspecified asthma, uncomplicated: Secondary | ICD-10-CM | POA: Diagnosis not present

## 2019-03-07 DIAGNOSIS — Z955 Presence of coronary angioplasty implant and graft: Secondary | ICD-10-CM | POA: Diagnosis not present

## 2019-03-07 DIAGNOSIS — Z9181 History of falling: Secondary | ICD-10-CM | POA: Diagnosis not present

## 2019-03-07 DIAGNOSIS — I10 Essential (primary) hypertension: Secondary | ICD-10-CM | POA: Diagnosis not present

## 2019-03-07 DIAGNOSIS — M1990 Unspecified osteoarthritis, unspecified site: Secondary | ICD-10-CM | POA: Diagnosis not present

## 2019-03-07 DIAGNOSIS — Z87891 Personal history of nicotine dependence: Secondary | ICD-10-CM | POA: Diagnosis not present

## 2019-03-07 DIAGNOSIS — Z794 Long term (current) use of insulin: Secondary | ICD-10-CM | POA: Diagnosis not present

## 2019-03-07 DIAGNOSIS — I2510 Atherosclerotic heart disease of native coronary artery without angina pectoris: Secondary | ICD-10-CM | POA: Diagnosis not present

## 2019-03-07 DIAGNOSIS — H548 Legal blindness, as defined in USA: Secondary | ICD-10-CM | POA: Diagnosis not present

## 2019-03-07 NOTE — Patient Outreach (Signed)
[facility]) Care Management  03/07/2019  Eddie Graves 01-Oct-1931 [id number]   Successful outreach to patient regarding status of referral to [facility].  In order to proceed with referral/assessment for services, the state requires record of patient's most recent eye exam.  Patient reports not having an exam in several years and cannot remember last provider.  BSW has attempted to contact his son multiple times to determine if he knows who last provider was but has been unsuccessful.  BSW informed patient of being unable to get in touch with his son.  He was informed that an updated exam will need to occur to be referred to DSB for services.  Patient requested that BSW continue to try and contact son so that he can assist with process.  Voicemail message left for son again today. BSW will attempt to reach him again within four business days.  Malachy Chamber, BSW Social Worker [phone]

## 2019-03-08 ENCOUNTER — Other Ambulatory Visit: Payer: Self-pay

## 2019-03-08 DIAGNOSIS — M1990 Unspecified osteoarthritis, unspecified site: Secondary | ICD-10-CM | POA: Diagnosis not present

## 2019-03-08 DIAGNOSIS — J45909 Unspecified asthma, uncomplicated: Secondary | ICD-10-CM | POA: Diagnosis not present

## 2019-03-08 DIAGNOSIS — E119 Type 2 diabetes mellitus without complications: Secondary | ICD-10-CM | POA: Diagnosis not present

## 2019-03-08 DIAGNOSIS — I2510 Atherosclerotic heart disease of native coronary artery without angina pectoris: Secondary | ICD-10-CM | POA: Diagnosis not present

## 2019-03-08 DIAGNOSIS — H548 Legal blindness, as defined in USA: Secondary | ICD-10-CM | POA: Diagnosis not present

## 2019-03-08 DIAGNOSIS — Z87891 Personal history of nicotine dependence: Secondary | ICD-10-CM | POA: Diagnosis not present

## 2019-03-08 DIAGNOSIS — Z955 Presence of coronary angioplasty implant and graft: Secondary | ICD-10-CM | POA: Diagnosis not present

## 2019-03-08 DIAGNOSIS — Z9181 History of falling: Secondary | ICD-10-CM | POA: Diagnosis not present

## 2019-03-08 DIAGNOSIS — Z794 Long term (current) use of insulin: Secondary | ICD-10-CM | POA: Diagnosis not present

## 2019-03-08 DIAGNOSIS — I10 Essential (primary) hypertension: Secondary | ICD-10-CM | POA: Diagnosis not present

## 2019-03-08 NOTE — Patient Outreach (Signed)
[facility]) Care Management  03/08/2019  Eddie Graves 18-Feb-1931 [id number]   BSW received return call from patient's son.  BSW informed him that a referral cannot be made to [employer] for the Blind without a copy of patient's most recent eye exam.  Son said that he believes his last provider was on [address] In [place] but he could not remember providers name. BSW did a Microbiologist and named some of the providers but none of them rang a bell.  Son said that he would drive by the practice and get back to me. BSW will follow up with him next week if no return call.  Malachy Chamber, BSW Social Worker [phone]

## 2019-03-09 ENCOUNTER — Ambulatory Visit: Payer: Self-pay

## 2019-03-13 ENCOUNTER — Ambulatory Visit: Payer: Self-pay

## 2019-03-13 ENCOUNTER — Other Ambulatory Visit: Payer: Self-pay

## 2019-03-13 DIAGNOSIS — I10 Essential (primary) hypertension: Secondary | ICD-10-CM | POA: Diagnosis not present

## 2019-03-13 DIAGNOSIS — I252 Old myocardial infarction: Secondary | ICD-10-CM | POA: Diagnosis not present

## 2019-03-13 DIAGNOSIS — J449 Chronic obstructive pulmonary disease, unspecified: Secondary | ICD-10-CM | POA: Diagnosis not present

## 2019-03-13 DIAGNOSIS — I25118 Atherosclerotic heart disease of native coronary artery with other forms of angina pectoris: Secondary | ICD-10-CM | POA: Diagnosis not present

## 2019-03-13 DIAGNOSIS — N189 Chronic kidney disease, unspecified: Secondary | ICD-10-CM | POA: Diagnosis not present

## 2019-03-13 NOTE — Patient Outreach (Signed)
[facility]) Care Management  03/13/2019  Eddie Graves March 24, 1931 [id number]   Unsuccessful outreach to patient's son regarding information needed to make referral to [facility].  BSW left voicemail message.  Will attempt to reach again within four business days.  Malachy Chamber, BSW Social Worker [phone]

## 2019-03-15 ENCOUNTER — Other Ambulatory Visit: Payer: Self-pay

## 2019-03-15 DIAGNOSIS — Z955 Presence of coronary angioplasty implant and graft: Secondary | ICD-10-CM | POA: Diagnosis not present

## 2019-03-15 DIAGNOSIS — Z87891 Personal history of nicotine dependence: Secondary | ICD-10-CM | POA: Diagnosis not present

## 2019-03-15 DIAGNOSIS — I2510 Atherosclerotic heart disease of native coronary artery without angina pectoris: Secondary | ICD-10-CM | POA: Diagnosis not present

## 2019-03-15 DIAGNOSIS — M1990 Unspecified osteoarthritis, unspecified site: Secondary | ICD-10-CM | POA: Diagnosis not present

## 2019-03-15 DIAGNOSIS — H548 Legal blindness, as defined in USA: Secondary | ICD-10-CM | POA: Diagnosis not present

## 2019-03-15 DIAGNOSIS — I10 Essential (primary) hypertension: Secondary | ICD-10-CM | POA: Diagnosis not present

## 2019-03-15 DIAGNOSIS — E119 Type 2 diabetes mellitus without complications: Secondary | ICD-10-CM | POA: Diagnosis not present

## 2019-03-15 DIAGNOSIS — J45909 Unspecified asthma, uncomplicated: Secondary | ICD-10-CM | POA: Diagnosis not present

## 2019-03-15 DIAGNOSIS — Z9181 History of falling: Secondary | ICD-10-CM | POA: Diagnosis not present

## 2019-03-15 DIAGNOSIS — Z794 Long term (current) use of insulin: Secondary | ICD-10-CM | POA: Diagnosis not present

## 2019-03-15 NOTE — Patient Outreach (Signed)
[facility]) Care Management  03/15/2019  Eddie Graves 04/06/1931 [id number]   BSW received return call from patient's son, [name].  Son reports that patient's last eye exam occurred at [facility] on [address]. In [place].  Record of this last exam is required to submit referral to [facility].  BSW left voicemail message for [name] at [facility] regarding referral information.  BSW will follow up with patient when return call is received.   Malachy Chamber, BSW Social Worker [phone]

## 2019-03-16 ENCOUNTER — Other Ambulatory Visit: Payer: Self-pay | Admitting: Pharmacist

## 2019-03-16 ENCOUNTER — Other Ambulatory Visit: Payer: Self-pay

## 2019-03-16 NOTE — Patient Outreach (Signed)
[facility]) Care Management  03/16/2019  Larron Ernzen February 10, 1931 [id number]   BSW received return call from [name] at [facility].  Ms. Jenelle Mages agreed to contact patient to further explain their assessment process and potential services available to him.  Ms. Jenelle Mages will follow up with me after speaking to patient and we will proceed with referral/assessment process if patient is interested in services.    Malachy Chamber, BSW Social Worker [phone]

## 2019-03-16 NOTE — Patient Outreach (Signed)
[facility]) Care Management  03/16/2019  Eddie Graves [birth year] [id number]  Called patient to follow up on OTC Catalog. HIPAA identifiers were obtained. Patient said his son did not bring him the catalog and now his medications have an $800 copay.  Last month, patient had extra help.  [facility] was [facility]. The Pharmacist said all the patient's medications have a high copay this month although they were only $8.95 last month.  [facility] was called. The representative said the patient's LIS ended on 03/07/2019 but then restarted on 03/08/2019.  Although it appeared to have restarted, when she ran a test claim, the patient's Tradjenta still had a copay >$100.  She recommended that we call Medicare.  The Medicare representative stated the patient was sent a letter on 02/04/2019 that stated a new Extra Help Application needed to be done/ patient needed to re-apply.  Extra Help Application was completed online.  Since patient was out of Insulin, [facility] was called and the patient was approved for a one-time free coupon to get 1 box of Humalog 70/30 [place] filled.  [facility] was called back and the pharmacist was able to get the prescription to go through today at no cost.  He said he would send it out to the patient today.  In addition, the patient's son was called and asked to be on the look out for a determination letter from [employer].  Plan: Call patient back in 2 weeks to see if they heard anything from [employer].  The one free box of insulin provides 18 days of therapy. Patient reported having 1 pen left which may provide a few more days of therapy.  Eddie Graves, PharmD, BCACP [facility] Clinical Pharmacist [phone]

## 2019-03-28 DIAGNOSIS — M48061 Spinal stenosis, lumbar region without neurogenic claudication: Secondary | ICD-10-CM | POA: Diagnosis not present

## 2019-03-28 DIAGNOSIS — I10 Essential (primary) hypertension: Secondary | ICD-10-CM | POA: Diagnosis not present

## 2019-03-28 DIAGNOSIS — M130 Polyarthritis, unspecified: Secondary | ICD-10-CM | POA: Diagnosis not present

## 2019-03-28 DIAGNOSIS — E1169 Type 2 diabetes mellitus with other specified complication: Secondary | ICD-10-CM | POA: Diagnosis not present

## 2019-03-29 ENCOUNTER — Other Ambulatory Visit: Payer: Self-pay | Admitting: Family Medicine

## 2019-03-29 ENCOUNTER — Ambulatory Visit
Admission: RE | Admit: 2019-03-29 | Discharge: 2019-03-29 | Disposition: A | Discharge status: Home / Self Care | Payer: Medicare Other | Source: Ambulatory Visit | Attending: Family Medicine | Admitting: Family Medicine

## 2019-03-29 ENCOUNTER — Other Ambulatory Visit: Payer: Self-pay

## 2019-03-29 DIAGNOSIS — M25551 Pain in right hip: Secondary | ICD-10-CM | POA: Diagnosis not present

## 2019-03-29 DIAGNOSIS — M25512 Pain in left shoulder: Secondary | ICD-10-CM | POA: Diagnosis not present

## 2019-03-29 DIAGNOSIS — M25511 Pain in right shoulder: Secondary | ICD-10-CM | POA: Diagnosis not present

## 2019-03-29 DIAGNOSIS — M130 Polyarthritis, unspecified: Secondary | ICD-10-CM

## 2019-03-29 DIAGNOSIS — M25552 Pain in left hip: Secondary | ICD-10-CM | POA: Diagnosis not present

## 2019-03-30 ENCOUNTER — Ambulatory Visit: Payer: Self-pay | Admitting: Pharmacist

## 2019-03-30 ENCOUNTER — Other Ambulatory Visit: Payer: Self-pay | Admitting: Pharmacist

## 2019-03-30 NOTE — Patient Outreach (Signed)
[facility]) Care Management  03/30/2019  Eddie Graves August 09, 1931 [id number]   Called patient and his son [name]). HIPAA identifiers were obtained. [name] confirmed they had not received notification of the patient's LIS status as of yet. Extra Help application was completed on 03/16/2019. It usually takes 3-4 weeks before patients hear of a determination. Patient said he has some insulin left but is not sure how much. Patient's son was not at his home so he could not tell either at the time of my call.  Plan: Call patient and son back in 7-10  weeks to see if they have heard anything.  [name], PharmD, BCACP [facility] Clinical Pharmacist [phone]

## 2019-04-04 ENCOUNTER — Other Ambulatory Visit: Payer: Self-pay

## 2019-04-04 NOTE — Patient Outreach (Signed)
[facility]) Care Management  04/04/2019  Eddie Graves 1931-01-14 [id number]   BSW left voicemail messages for Eddie Graves with [facility] on 03/29/19 and today to determine if she has been able to connect with patient.  Per our conversation on 03/16/19, Ms. Eddie Graves was going to contact patient to further discuss available services.  BSW requested return call.  Malachy Chamber, BSW Social Worker [phone]

## 2019-04-12 ENCOUNTER — Other Ambulatory Visit: Payer: Self-pay

## 2019-04-12 ENCOUNTER — Other Ambulatory Visit: Payer: Self-pay | Admitting: Pharmacist

## 2019-04-12 ENCOUNTER — Ambulatory Visit: Payer: Self-pay

## 2019-04-12 NOTE — Patient Outreach (Signed)
[facility]) Care Management  04/12/2019  Brydon Wenzinger [birth year] [id number]   Third voicemail message left today for [name] with [facility].  Per our conversation on 03/16/19, Ms. Jenelle Mages was going to contact patient to further discuss available services.  BSW requested return call.   BSW attempted to contact patient as well but had to leave voicemail message.  Will attempt to reach again within four business days.  Malachy Chamber, BSW Social Worker [phone]

## 2019-04-12 NOTE — Patient Outreach (Signed)
[facility]) Care Management  04/12/2019  Eddie Graves 03-07-1931 [id number]  [facility] to check on prices of the patient's medications. Unfortunately, the prices were not back down to the Full Extra Help Copays the patient was previously paying.   Called the patient's son, Eddie Graves. HIPAA identifiers were obtained. Patient read the approval letter to me from [employer]. The patient was approved but he was only approved for partial extra help (50%).  Unfortunately, the [facility] was already used to get the patient a free supply of Humalog 75/25 mix.  That program offers a one-time in a lifetime coupon.  [facility] reported the copay as $84.00 for [name]. The patient's son wondered if there were any other solutions.  Novo Nordisk has a free coupon for up to 2 boxes of insulin due to COVID.    Dr. Tedra Senegal office was called to see if they would be willing to switch the patient to Novolog Mix. Awaiting a call back.  Will send patient assistance applications for:  Spiriva-Boeringher Ingelheim, Tradjenta, Jardiance Dulera/Proventil HFA-Merck Novolog Mix 70/30- [employer] or [name] 75/25 [employer]  Patient's son communicated understanding about the applications and awaiting a call back to discuss the potential insulin change.   [name], PharmD, BCACP [facility] Clinical Pharmacist [phone]

## 2019-04-13 ENCOUNTER — Other Ambulatory Visit: Payer: Self-pay | Admitting: Pharmacy Technician

## 2019-04-13 ENCOUNTER — Other Ambulatory Visit: Payer: Self-pay

## 2019-04-13 NOTE — Patient Outreach (Signed)
[facility]) Care Management  04/13/2019  [name] 1931/09/17 [id number]   BSW received return call from [facility] from [facility] for [facility].  Ms. Jenelle Mages spoke with patient and his son regarding services.  Per Ms. Hairston, patient is mostly wanting someone that can help clean his home but they do not provide this service.  Ms. Jenelle Mages reports that patient is "pretty independent".  Per Ms. Hairston, patient prepares his own breakfast and has meals delivered through Meals on Wheels program.  He also reported that his son, daughter, or friend assist with cooking meals at times.  Per Ms. Hairston, patient denied transportation needs and is not interested in using [facility] due to a bad experience he had with the service in the past.   Patient informed Ms. Hairston that he has a white cane and talking watch.  She reports that DSB may be able to assist with additional services/equipment, however, the formal assessment process requires a home visit which they are not doing at this time due to COVID19.  Ms. Saddie Benders reported that she will be back in touch with patient when able to begin the assessment process.  BSW is closing case at this time.  Malachy Chamber, BSW Social Worker [phone]

## 2019-04-13 NOTE — Patient Outreach (Signed)
  [facility]) Care Management  04/13/2019  Eddie Graves 11-11-1931 [id number]     Medication Assistance Referral  Referral From: [facility] RPh Eddie B.  Medication/Company: [name] and Proventil / Merck Patient application portion:  Mining engineer portion:  Mailed  to Dr. Parke Simmers  Medication/Company: [name] / BI Patient application portion:  Mailed Provider application portion: Faxed  to Dr. Parke Simmers  Medication/Company: [name] / Lilly Patient application portion:  Mailed Provider application portion: Faxed  to Dr Parke Simmers  Medication/Company: [name] [name] Patient application portion:  Mining engineer portion: Faxed  to Dr Parke Simmers  Follow up:  Will follow up with patient in 5-10 business days to confirm application(s) have been received.  [name] P. [name], CPhT Musician Care Management [phone]

## 2019-04-14 ENCOUNTER — Ambulatory Visit: Payer: Medicare Other

## 2019-04-17 ENCOUNTER — Other Ambulatory Visit: Payer: Self-pay | Admitting: Pharmacist

## 2019-04-17 NOTE — Patient Outreach (Signed)
[facility]) Care Management  04/17/2019  Eddie Graves 01/16/1931 [id number]   Called Dr. Tedra Senegal office again to request a new prescription of Novolog 70/30 be sent to the patient's pharmacy. Today's call was the second call to Dr. Parke Simmers requesting a therapeutic substitution.    Left messages on both the patient and his son's voice mails.  Called [facility] to see if Dr. Parke Simmers sent in a new prescription for Novolog Mix and unfortunately, she had not at the time of my call.  The coupon was explained to the Pharmacist who said he would use it when the new prescription came in from Dr. Parke Simmers.  A free coupon for [employer] was faxed to the patient's pharmacy to be used just in case Dr. Parke Simmers sends the prescription.  Plan: Call patient back in 2 weeks per PAL unless I speak with him or his son first.  [name], PharmD, [facility] Clinical Pharmacist [phone]

## 2019-04-19 ENCOUNTER — Other Ambulatory Visit: Payer: Self-pay | Admitting: Internal Medicine

## 2019-04-19 DIAGNOSIS — IMO0002 Reserved for concepts with insufficient information to code with codable children: Secondary | ICD-10-CM

## 2019-04-19 DIAGNOSIS — H547 Unspecified visual loss: Secondary | ICD-10-CM

## 2019-04-19 DIAGNOSIS — E1142 Type 2 diabetes mellitus with diabetic polyneuropathy: Secondary | ICD-10-CM

## 2019-04-24 ENCOUNTER — Other Ambulatory Visit: Payer: Self-pay | Admitting: Pharmacy Technician

## 2019-04-24 NOTE — Patient Outreach (Signed)
[facility]) Care Management  04/24/2019  India Girgis September 12, 1931 [id number]  Successful outreach call placed to patient in regards to BI applications for Jardiance, Tradjenta and Spiriva, Human resources officer for [name], Warehouse manager for [employer] for [employer] (the last 2 will be interchanged for whichever one the provider decides to prescribe).  Spoke to patient's son [name], HIPAA identifiers verified.  Patient's son says they have not received the applications in the mail. Informed patient's son that our mail has to go over to the hospital first for postage and then is sent to post office. Informed patient's son that I would place another set in the mail. The envelope would be 8x10 and say [facility] on the outside. Informed patient's son that if he receives the first set in the meantime then he could disregard the second set. Patient's son was agreeable and verbalized understanding.  Will mail out another set of applications on 04/25/2019 when I am back in the office and will followup with patient or his son within 5-7 business days of the 19th of May.  Leilanie Rauda P. Anjelo Pullman, CPhT Musician Care Management [phone]

## 2019-04-27 ENCOUNTER — Ambulatory Visit: Payer: Self-pay | Admitting: Pharmacist

## 2019-04-27 DIAGNOSIS — E1169 Type 2 diabetes mellitus with other specified complication: Secondary | ICD-10-CM | POA: Diagnosis not present

## 2019-04-27 DIAGNOSIS — M130 Polyarthritis, unspecified: Secondary | ICD-10-CM | POA: Diagnosis not present

## 2019-05-02 ENCOUNTER — Other Ambulatory Visit: Payer: Self-pay | Admitting: Pharmacy Technician

## 2019-05-02 NOTE — Patient Outreach (Signed)
[facility]) Care Management  05/02/2019  Eddie Graves January 10, 1931 [id number]    Successful outreach call placed to patient in regards to BI application for Jardiance, Spiriva, Tradjenta, Merck application for [employer], Lilly/Novo application for either Humalog or Novolog Mix.  Spoke to patient's son who said this was not a good time and that he would call me back.  Will followup with patient in 3-5 business days if call is not returned.  Eddie Graves P. Devondre Guzzetta, CPhT Musician Care Management [phone]

## 2019-05-03 ENCOUNTER — Other Ambulatory Visit: Payer: Self-pay | Admitting: *Deleted

## 2019-05-03 ENCOUNTER — Other Ambulatory Visit: Payer: Self-pay | Admitting: Pharmacy Technician

## 2019-05-03 NOTE — Patient Outreach (Signed)
[facility]) Care Management  05/03/2019  Eddie Graves 07/29/1931 [id number]  Incoming call received from [facility] at Dr Tedra Senegal office.  Eddie Graves was calling to inquire why we were sending the office medication assistance paperwork and who and why did the patient get referred to our services. She also was inquiring as to why [employer] was calling and faxing daily for refill requests when she sees the only medication he has ever picked up was NTG.  Informed Eddie Graves patient was referred to ou services because he was having trouble paying for his medications. Informed Eddie Graves I could not attest to why [employer] was calling and faxing. Eddie Graves appreciated the information and would relay the information to the provider and hopefully have the forms sent over.  Eddie Oelkers P. Ainslee Sou, CPhT Musician Care Management [phone]

## 2019-05-08 ENCOUNTER — Other Ambulatory Visit: Payer: Self-pay | Admitting: Pharmacy Technician

## 2019-05-08 NOTE — Patient Outreach (Signed)
[facility]) Care Management  05/08/2019  Eddie Graves Jul 26, 1931 [id number]    Successful outreach call placed to patient in regards to the multiple patient assistance applications: BI for Tradjenta, Jardiance, Spiriva, [employer] for [employer] and Proventil and Lilly/Novo for Humalog or Novolog Mix whichever the provider decides to keep him on.  Spoke to patient's son [name] who informed that patient has received the applications. [name] informed that it was a lot of paperwork. Informed [name] that we were trying to get the patient medication assistance help on several of his medications.  Patient's son informed he has not filled out the applications and has not really even looked at them. He informed he was appealing the decision concerning Extra Help. He informed his father has always received full extra help and he doesn't understand why they reduced it to 50%. He said he was waiting on that appeals decision before doing the patient assistance paperwork. Inquired if patient was going to be able to afford his medications in the meantime as that process could take several weeks. He informed that patient had at least a months supply of insulin. He informed he was not sure when he would get around to filling out the paperwork as life was hectic right now.  Informed patient's son I would followup with him in 7-10 business days and he was agreeable.  Ashton Sabine P. Amri Lien, CPhT Musician Care Management [phone]

## 2019-05-17 ENCOUNTER — Ambulatory Visit: Payer: Self-pay | Admitting: Pharmacist

## 2019-05-22 ENCOUNTER — Other Ambulatory Visit: Payer: Self-pay | Admitting: Pharmacy Technician

## 2019-05-22 NOTE — Patient Outreach (Signed)
[facility]) Care Management  05/22/2019  Abishai Viegas 1931-10-08 [id number]  Unsuccessful outreach attempt placed to patient in regards to multiple medication assistance applications.  Unfortunately patient and patient's son Christia Reading did not answer the phone.  HIPAA compliant voicemail left.  Was making 4th call attempt to inquire if applications have been mailed back.  Will route note to [place] that I am removing myself from care team due to lack of engagement form patient for his medicaiton assistance applications.   Matheau Orona P. Troye Hiemstra, [place] Management [phone]

## 2019-05-24 DIAGNOSIS — I10 Essential (primary) hypertension: Secondary | ICD-10-CM | POA: Diagnosis not present

## 2019-05-24 DIAGNOSIS — E1169 Type 2 diabetes mellitus with other specified complication: Secondary | ICD-10-CM | POA: Diagnosis not present

## 2019-06-12 DIAGNOSIS — R0609 Other forms of dyspnea: Secondary | ICD-10-CM | POA: Diagnosis not present

## 2019-06-12 DIAGNOSIS — I252 Old myocardial infarction: Secondary | ICD-10-CM | POA: Diagnosis not present

## 2019-06-12 DIAGNOSIS — N189 Chronic kidney disease, unspecified: Secondary | ICD-10-CM | POA: Diagnosis not present

## 2019-06-12 DIAGNOSIS — I25118 Atherosclerotic heart disease of native coronary artery with other forms of angina pectoris: Secondary | ICD-10-CM | POA: Diagnosis not present

## 2019-06-12 DIAGNOSIS — E1122 Type 2 diabetes mellitus with diabetic chronic kidney disease: Secondary | ICD-10-CM | POA: Diagnosis not present

## 2019-06-15 DIAGNOSIS — I252 Old myocardial infarction: Secondary | ICD-10-CM | POA: Diagnosis not present

## 2019-06-15 DIAGNOSIS — E119 Type 2 diabetes mellitus without complications: Secondary | ICD-10-CM | POA: Diagnosis not present

## 2019-06-15 DIAGNOSIS — R0609 Other forms of dyspnea: Secondary | ICD-10-CM | POA: Diagnosis not present

## 2019-06-15 DIAGNOSIS — I10 Essential (primary) hypertension: Secondary | ICD-10-CM | POA: Diagnosis not present

## 2019-06-15 DIAGNOSIS — I25118 Atherosclerotic heart disease of native coronary artery with other forms of angina pectoris: Secondary | ICD-10-CM | POA: Diagnosis not present

## 2019-07-05 DIAGNOSIS — I10 Essential (primary) hypertension: Secondary | ICD-10-CM | POA: Diagnosis not present

## 2019-07-05 DIAGNOSIS — E1169 Type 2 diabetes mellitus with other specified complication: Secondary | ICD-10-CM | POA: Diagnosis not present

## 2019-07-06 ENCOUNTER — Other Ambulatory Visit: Payer: Self-pay | Admitting: Pharmacist

## 2019-07-06 NOTE — Patient Outreach (Addendum)
[facility]) Care Management  07/06/2019  Algis Lehenbauer [birth year] [id number]   Called the patient's pharmacy to check on his copay amounts as an Extra Help Application was completed online for the patient a few months ago.  We had not been able to reach the patient or his son.    Spoke with a Education administrator at [employer] who confirmed the patient's copay for [name] 70/30 was $8.95.  The copay amount confirms the patient was approved for Full LIS as prior to this, his copayment was >$100.  Called the patient and left a message on his voicemail.  I also called the patient's son Octavia Bruckner).  HIPAA identifiers were obtained. Tim confirmed the patient had been approved but said he did not have time to talk and would call me back in a few hours.  I did not receive a call back from the patient's son.  Since the patient has been approved for LIS and he was referred for medication assistance, his pharmacy case will be closed.  Patient is still active with [employer]. An inbox message will be sent.  Elayne Guerin, PharmD, [place] Clinical Pharmacist [phone]   ADDENDUM  Christia Reading called me back. HIPAA identifiers were obtained. He confirmed the patient has FULL LIS. He communicated understanding about his pharmacy case being closed and said he had by number to call for any questions or concerns.  Elayne Guerin, PharmD, [place] Clinical Pharmacist [phone]

## 2019-07-07 DIAGNOSIS — G5603 Carpal tunnel syndrome, bilateral upper limbs: Secondary | ICD-10-CM | POA: Diagnosis not present

## 2019-08-21 DIAGNOSIS — I10 Essential (primary) hypertension: Secondary | ICD-10-CM | POA: Diagnosis not present

## 2019-08-21 DIAGNOSIS — E785 Hyperlipidemia, unspecified: Secondary | ICD-10-CM | POA: Diagnosis not present

## 2019-08-21 DIAGNOSIS — E119 Type 2 diabetes mellitus without complications: Secondary | ICD-10-CM | POA: Diagnosis not present

## 2019-08-21 DIAGNOSIS — I2510 Atherosclerotic heart disease of native coronary artery without angina pectoris: Secondary | ICD-10-CM | POA: Diagnosis not present

## 2019-09-11 DIAGNOSIS — I2510 Atherosclerotic heart disease of native coronary artery without angina pectoris: Secondary | ICD-10-CM | POA: Diagnosis not present

## 2019-09-11 DIAGNOSIS — E785 Hyperlipidemia, unspecified: Secondary | ICD-10-CM | POA: Diagnosis not present

## 2019-09-11 DIAGNOSIS — I10 Essential (primary) hypertension: Secondary | ICD-10-CM | POA: Diagnosis not present

## 2019-09-11 DIAGNOSIS — E119 Type 2 diabetes mellitus without complications: Secondary | ICD-10-CM | POA: Diagnosis not present

## 2019-09-25 DIAGNOSIS — H40022 Open angle with borderline findings, high risk, left eye: Secondary | ICD-10-CM | POA: Diagnosis not present

## 2019-09-25 DIAGNOSIS — H16223 Keratoconjunctivitis sicca, not specified as Sjogren's, bilateral: Secondary | ICD-10-CM | POA: Diagnosis not present

## 2019-09-25 DIAGNOSIS — H547 Unspecified visual loss: Secondary | ICD-10-CM | POA: Diagnosis not present

## 2019-10-11 DIAGNOSIS — E1169 Type 2 diabetes mellitus with other specified complication: Secondary | ICD-10-CM | POA: Diagnosis not present

## 2019-10-11 DIAGNOSIS — I10 Essential (primary) hypertension: Secondary | ICD-10-CM | POA: Diagnosis not present

## 2019-10-17 DIAGNOSIS — Z03818 Encounter for observation for suspected exposure to other biological agents ruled out: Secondary | ICD-10-CM | POA: Diagnosis not present

## 2019-12-11 DIAGNOSIS — M130 Polyarthritis, unspecified: Secondary | ICD-10-CM | POA: Diagnosis not present

## 2019-12-11 DIAGNOSIS — M4808 Spinal stenosis, sacral and sacrococcygeal region: Secondary | ICD-10-CM | POA: Diagnosis not present

## 2019-12-11 DIAGNOSIS — I10 Essential (primary) hypertension: Secondary | ICD-10-CM | POA: Diagnosis not present

## 2019-12-11 DIAGNOSIS — E1169 Type 2 diabetes mellitus with other specified complication: Secondary | ICD-10-CM | POA: Diagnosis not present

## 2020-01-30 ENCOUNTER — Other Ambulatory Visit: Payer: Self-pay

## 2020-01-30 NOTE — Patient Outreach (Signed)
[facility]) Care Management  01/30/2020  Eddie Graves 1931-05-03 [id number]   Medication Adherence call to [name] HIPPA Compliant Voice message left with a call back number. [name] is showing past due on Rosuvastatin 20 mg and Lisinopril 20 mg under [employer].   Lillia Abed CPhT Pharmacy Technician [facility] Management Direct Dial [phone]  Fax [phone] [email or web address]

## 2020-02-14 DIAGNOSIS — E785 Hyperlipidemia, unspecified: Secondary | ICD-10-CM | POA: Diagnosis not present

## 2020-02-14 DIAGNOSIS — N189 Chronic kidney disease, unspecified: Secondary | ICD-10-CM | POA: Diagnosis not present

## 2020-02-14 DIAGNOSIS — I25118 Atherosclerotic heart disease of native coronary artery with other forms of angina pectoris: Secondary | ICD-10-CM | POA: Diagnosis not present

## 2020-02-14 DIAGNOSIS — E119 Type 2 diabetes mellitus without complications: Secondary | ICD-10-CM | POA: Diagnosis not present

## 2020-02-14 DIAGNOSIS — I10 Essential (primary) hypertension: Secondary | ICD-10-CM | POA: Diagnosis not present

## 2020-02-19 ENCOUNTER — Ambulatory Visit: Payer: Medicare Other | Attending: Internal Medicine

## 2020-02-19 DIAGNOSIS — Z23 Encounter for immunization: Secondary | ICD-10-CM

## 2020-02-19 NOTE — Progress Notes (Signed)
   Covid-19 Vaccination Clinic  Name:  Eddie Graves    MRN: [id number] DOB: 08/22/1931  02/19/2020  [name] was observed post Covid-19 immunization for 15 minutes without incident. He was provided with Vaccine Information Sheet and instruction to access the V-Safe system.   [name] was instructed to call 911 with any severe reactions post vaccine: Marland Kitchen Difficulty breathing  . Swelling of face and throat  . A fast heartbeat  . A bad rash all over body  . Dizziness and weakness   Immunizations Administered    Name Date Dose VIS Date Route   Pfizer COVID-19 Vaccine 02/19/2020 12:54 PM 0.3 mL 11/17/2019 Intramuscular   Manufacturer: [employer], [employer]   Lot: [id number]   NDC: [address]-2

## 2020-03-13 ENCOUNTER — Ambulatory Visit: Payer: Medicare Other | Attending: Internal Medicine

## 2020-03-13 DIAGNOSIS — Z23 Encounter for immunization: Secondary | ICD-10-CM

## 2020-03-13 NOTE — Progress Notes (Signed)
   Covid-19 Vaccination Clinic  Name:  Eddie Graves    MRN: [id number] DOB: Jun 29, 1931  03/13/2020  [name] was observed post Covid-19 immunization for 15 minutes without incident. He was provided with Vaccine Information Sheet and instruction to access the V-Safe system.   [name] was instructed to call 911 with any severe reactions post vaccine: Marland Kitchen Difficulty breathing  . Swelling of face and throat  . A fast heartbeat  . A bad rash all over body  . Dizziness and weakness   Immunizations Administered    Name Date Dose VIS Date Route   Pfizer COVID-19 Vaccine 03/13/2020  1:39 PM 0.3 mL 11/17/2019 Intramuscular   Manufacturer: [employer], [employer]   Lot: [id number]   NDC: [address]-9

## 2020-04-15 DIAGNOSIS — H903 Sensorineural hearing loss, bilateral: Secondary | ICD-10-CM | POA: Diagnosis not present

## 2020-04-15 DIAGNOSIS — E1169 Type 2 diabetes mellitus with other specified complication: Secondary | ICD-10-CM | POA: Diagnosis not present

## 2020-04-15 DIAGNOSIS — I10 Essential (primary) hypertension: Secondary | ICD-10-CM | POA: Diagnosis not present

## 2020-05-14 ENCOUNTER — Other Ambulatory Visit: Payer: Self-pay

## 2020-05-14 ENCOUNTER — Encounter: Payer: Self-pay | Admitting: Sports Medicine

## 2020-05-14 ENCOUNTER — Ambulatory Visit: Payer: Medicare Other | Admitting: Sports Medicine

## 2020-05-14 VITALS — Temp 96.4°F

## 2020-05-14 DIAGNOSIS — E1142 Type 2 diabetes mellitus with diabetic polyneuropathy: Secondary | ICD-10-CM

## 2020-05-14 DIAGNOSIS — M79675 Pain in left toe(s): Secondary | ICD-10-CM | POA: Diagnosis not present

## 2020-05-14 DIAGNOSIS — M79674 Pain in right toe(s): Secondary | ICD-10-CM

## 2020-05-14 DIAGNOSIS — B351 Tinea unguium: Secondary | ICD-10-CM | POA: Diagnosis not present

## 2020-05-14 NOTE — Progress Notes (Signed)
Subjective: Eddie Graves is a 86 y.o. male patient with history of diabetes who returns to office today complaining of long, painful nails while ambulating in shoes; unable to trim. Reports that he wants diabetic shoes. Patient is blind. Patient denies any changes since last encounter.   Patient is assisted by wife this visit.   Patient Active Problem List   Diagnosis Date Noted   Acute non Q wave myocardial infarction ([place]) 12/12/2016   Uncontrolled type 2 diabetes mellitus with diabetic polyneuropathy, with long-term current use of insulin ([place]) 03/05/2016   Tobacco abuse counseling 03/05/2016   Right sided abdominal pain 03/05/2016   Constipation 03/05/2016   Chest pain at rest 16/09/9603   Diastolic heart failure ([place]) 11/06/2015   Acute bronchitis with chronic obstructive pulmonary disease (COPD) ([place]) 11/06/2015   Neurological deficit, transient 11/06/2015   Left facial numbness    AKI (acute kidney injury) ([place])    Chest pain 05/19/2015   History of gonorrhea 05/19/2015   Hypotension 05/19/2015   CKD (chronic kidney disease), stage III 05/19/2015   Involuntary muscle contractions 10/12/2014   Seizure disorder after [facility] 08/19/2014   Diabetic peripheral neuropathy associated with type 2 diabetes mellitus ([place]) 07/26/2014   Blind 07/26/2014   Tobacco abuse 04/26/2014   Obesity (BMI 30-39.9) 08/18/2013   COPD exacerbation ([place]) 08/06/2013   SAH (subarachnoid hemorrhage) ([place]) 05/30/2013   Coronary atherosclerosis of native coronary artery    Vitamin D deficiency    Memory loss    COPD (chronic obstructive pulmonary disease) ([place]) 03/16/2013   Essential hypertension, benign 03/16/2013   Depressive disorder, not elsewhere classified 03/16/2013   Spinal stenosis, lumbar region, with neurogenic claudication 03/16/2013   Current Outpatient Medications on File Prior to Visit  Medication Sig Dispense Refill   amLODipine (NORVASC) 10 MG tablet  Take 5 mg by mouth daily.      aspirin EC 81 MG tablet 1/2 tablet by mouth daily per Cardiologist 15 tablet 11   Cholecalciferol (VITAMIN D3) 2000 units capsule TAKE 1 CAPSULE (2,000 UNITS TOTAL) BY MOUTH DAILY. FOR VITAMIN DAY REPLETION 30 capsule 0   clopidogrel (PLAVIX) 75 MG tablet Take 1 tablet (75 mg total) by mouth daily with breakfast. 30 tablet 3   CRESTOR 20 MG tablet Take 20 mg by mouth daily. For cholesterol     diclofenac sodium (VOLTAREN) 1 % GEL Apply to affected joints four times daily     doxycycline (VIBRAMYCIN) 100 MG capsule Take 1 capsule (100 mg total) by mouth 2 (two) times daily. 20 capsule 0   esomeprazole (NEXIUM) 40 MG capsule TAKE ONE CAPSULE BY MOUTH ONCE DAILY BEFORE BREAKFAST FOR STOMACH 30 capsule 0   gabapentin (NEURONTIN) 300 MG capsule Take 1 capsule by mouth 3 (three) times daily.     GARLIC PO Take 5,409 mcg by mouth daily.      glucose blood (PRODIGY NO CODING BLOOD GLUC) test strip PRODIGY TEST STRIPS, CHECK BLOOD SUGAR TWICE DAILY DX: E11.65, H54.7 PATIENT WITH BLINDNESS AND NEEDS STRIPS WITH READ ALOUD MONITOR 200 each 3   Insulin Lispro Prot & Lispro ([name] 75/25 KWIKPEN) (75-25) 100 UNIT/ML Kwikpen Inject 40 Units into the skin 2 (two) times daily with a meal. 15 mL 11   levalbuterol (XOPENEX) 1.25 MG/3ML nebulizer solution TAKE 1.25 MG (ONE AMPULE) BY NEBULIZATION EVERY 4 (FOUR) HOURS AS NEEDED FOR WHEEZING. 72 mL 0   lisinopril (PRINIVIL,ZESTRIL) 20 MG tablet TAKE 1 TABLET (20 MG TOTAL) BY MOUTH DAILY 30 tablet  0   metoprolol succinate (TOPROL-XL) 100 MG 24 hr tablet Take one tablet by mouth once daily for blood pressure 90 tablet 3   mometasone-formoterol (DULERA) 200-5 MCG/ACT AERO Inhale 2 puffs into the lungs 2 (two) times daily. 2 Inhaler 3   nitroGLYCERIN (NITRODUR - DOSED IN MG/24 HR) 0.4 mg/hr patch Apply 1 patch to the skin daily     nitroGLYCERIN (NITROSTAT) 0.4 MG SL tablet Place 1 tablet (0.4 mg total) under the tongue  every 5 (five) minutes as needed for chest pain. 25 tablet 12   Omega 3 1200 MG CAPS Take 2,400 mg by mouth daily. For cholesterol     predniSONE (DELTASONE) 20 MG tablet Take 2 tablets (40 mg total) by mouth daily with breakfast. 10 tablet 0   PROAIR HFA 108 (90 Base) MCG/ACT inhaler INHALE TWO PUFFS INTO THE LUNGS EVERY TWO HOURS IF NEEDED 18 g 0   sertraline (ZOLOFT) 50 MG tablet TAKE ONE TABLET BY MOUTH ONCE DAILY 30 tablet 0   tiotropium (SPIRIVA) 18 MCG inhalation capsule Place 1 capsule (18 mcg total) into inhaler and inhale daily. 30 capsule 12   TRADJENTA 5 MG TABS tablet TAKE ONE TABLET BY MOUTH ONCE DAILY TO CONTROL BLOOD SUGAR 30 tablet 0   No current facility-administered medications on file prior to visit.   Allergies  Allergen Reactions   Flexeril [Cyclobenzaprine] Other (See Comments)    delirium   Nsaids Other (See Comments)    Renal failure    No results found for this or any previous visit (from the past 2160 hour(s)).  Objective: General: Patient is awake, alert, and oriented x 3 and in no acute distress.  Integument: Skin is warm, dry and supple bilateral. Nails are tender, long, thickened and dystrophic with subungual debris, consistent with onychomycosis, 1-5 bilateral. No signs of infection. No open lesions or preulcerative lesions present bilateral. History of callus at heels remains resolved. Remaining integument unremarkable.  Vasculature:  Dorsalis Pedis pulse 1/4 bilateral. Posterior Tibial pulse  1/4 bilateral. Capillary fill time <3 sec 1-5 bilateral. Scant hair growth to the level of the digits.Temperature gradient within normal limits. No varicosities present bilateral. No edema present bilateral.   Neurology: The patient has intact sensation measured with a 5.07/10g Semmes Weinstein Monofilament at all pedal sites bilateral. Vibratory sensation diminished bilateral with tuning fork. No Babinski sign present bilateral.   Musculoskeletal:  Asymptomatic bunion and pes planus pedal deformities noted bilateral. Muscular strength 5/5 in all lower extremity muscular groups bilateral without pain on range of motion. No tenderness with calf compression bilateral.  Assessment and Plan: Problem List Items Addressed This Visit    None    Visit Diagnoses    Pain due to onychomycosis of toenails of both feet    -  Primary   Diabetic polyneuropathy associated with type 2 diabetes mellitus (HCC)         -Examined patient. -Re-discussed and educated patient on diabetic foot care, especially with regards to the vascular, neurological and musculoskeletal systems.  -Mechanically debrided all nails 1-5 bilateral using sterile nail nipper and filed with dremel without incident -Patient to see Raiford Noble for Diabetic shoes -Patient to return  in 3 months for at risk foot care -Patient advised to call the office if any problems or questions arise in the meantime.  Asencion Islam, DPM

## 2020-05-22 DIAGNOSIS — E785 Hyperlipidemia, unspecified: Secondary | ICD-10-CM | POA: Diagnosis not present

## 2020-05-22 DIAGNOSIS — E119 Type 2 diabetes mellitus without complications: Secondary | ICD-10-CM | POA: Diagnosis not present

## 2020-05-22 DIAGNOSIS — N189 Chronic kidney disease, unspecified: Secondary | ICD-10-CM | POA: Diagnosis not present

## 2020-05-22 DIAGNOSIS — I25118 Atherosclerotic heart disease of native coronary artery with other forms of angina pectoris: Secondary | ICD-10-CM | POA: Diagnosis not present

## 2020-05-22 DIAGNOSIS — I10 Essential (primary) hypertension: Secondary | ICD-10-CM | POA: Diagnosis not present

## 2020-07-02 DIAGNOSIS — E1169 Type 2 diabetes mellitus with other specified complication: Secondary | ICD-10-CM | POA: Diagnosis not present

## 2020-07-02 DIAGNOSIS — R634 Abnormal weight loss: Secondary | ICD-10-CM | POA: Diagnosis not present

## 2020-07-02 DIAGNOSIS — Z0000 Encounter for general adult medical examination without abnormal findings: Secondary | ICD-10-CM | POA: Diagnosis not present

## 2020-07-02 DIAGNOSIS — I10 Essential (primary) hypertension: Secondary | ICD-10-CM | POA: Diagnosis not present

## 2020-07-02 DIAGNOSIS — M4808 Spinal stenosis, sacral and sacrococcygeal region: Secondary | ICD-10-CM | POA: Diagnosis not present

## 2020-07-21 DIAGNOSIS — R001 Bradycardia, unspecified: Secondary | ICD-10-CM | POA: Diagnosis not present

## 2020-07-21 DIAGNOSIS — R410 Disorientation, unspecified: Secondary | ICD-10-CM | POA: Diagnosis not present

## 2020-07-21 DIAGNOSIS — Z743 Need for continuous supervision: Secondary | ICD-10-CM | POA: Diagnosis not present

## 2020-07-21 DIAGNOSIS — I959 Hypotension, unspecified: Secondary | ICD-10-CM | POA: Diagnosis not present

## 2020-07-22 ENCOUNTER — Encounter (HOSPITAL_COMMUNITY): Payer: Self-pay | Admitting: Emergency Medicine

## 2020-07-22 ENCOUNTER — Other Ambulatory Visit: Payer: Self-pay

## 2020-07-22 ENCOUNTER — Emergency Department (HOSPITAL_COMMUNITY)
Admission: EM | Admit: 2020-07-22 | Discharge: 2020-07-22 | Disposition: A | Discharge status: Home / Self Care | Payer: Medicare Other | Attending: Emergency Medicine | Admitting: Emergency Medicine

## 2020-07-22 DIAGNOSIS — E1140 Type 2 diabetes mellitus with diabetic neuropathy, unspecified: Secondary | ICD-10-CM | POA: Diagnosis not present

## 2020-07-22 DIAGNOSIS — Z955 Presence of coronary angioplasty implant and graft: Secondary | ICD-10-CM | POA: Insufficient documentation

## 2020-07-22 DIAGNOSIS — E11649 Type 2 diabetes mellitus with hypoglycemia without coma: Secondary | ICD-10-CM | POA: Insufficient documentation

## 2020-07-22 DIAGNOSIS — Z7951 Long term (current) use of inhaled steroids: Secondary | ICD-10-CM | POA: Diagnosis not present

## 2020-07-22 DIAGNOSIS — R4182 Altered mental status, unspecified: Secondary | ICD-10-CM | POA: Diagnosis present

## 2020-07-22 DIAGNOSIS — F17210 Nicotine dependence, cigarettes, uncomplicated: Secondary | ICD-10-CM | POA: Diagnosis not present

## 2020-07-22 DIAGNOSIS — I10 Essential (primary) hypertension: Secondary | ICD-10-CM | POA: Diagnosis not present

## 2020-07-22 DIAGNOSIS — J449 Chronic obstructive pulmonary disease, unspecified: Secondary | ICD-10-CM | POA: Diagnosis not present

## 2020-07-22 DIAGNOSIS — N289 Disorder of kidney and ureter, unspecified: Secondary | ICD-10-CM

## 2020-07-22 DIAGNOSIS — Z79899 Other long term (current) drug therapy: Secondary | ICD-10-CM | POA: Diagnosis not present

## 2020-07-22 DIAGNOSIS — Z794 Long term (current) use of insulin: Secondary | ICD-10-CM | POA: Diagnosis not present

## 2020-07-22 DIAGNOSIS — E162 Hypoglycemia, unspecified: Secondary | ICD-10-CM

## 2020-07-22 LAB — CBC WITH DIFFERENTIAL/PLATELET
Abs Immature Granulocytes: 0.03 10*3/uL (ref 0.00–0.07)
Basophils Absolute: 0 10*3/uL (ref 0.0–0.1)
Basophils Relative: 0 %
Eosinophils Absolute: 0.1 10*3/uL (ref 0.0–0.5)
Eosinophils Relative: 1 %
HCT: 43.8 % (ref 39.0–52.0)
Hemoglobin: 13.3 g/dL (ref 13.0–17.0)
Immature Granulocytes: 0 %
Lymphocytes Relative: 11 %
Lymphs Abs: 1 10*3/uL (ref 0.7–4.0)
MCH: 29.1 pg (ref 26.0–34.0)
MCHC: 30.4 g/dL (ref 30.0–36.0)
MCV: 95.8 fL (ref 80.0–100.0)
Monocytes Absolute: 0.7 10*3/uL (ref 0.1–1.0)
Monocytes Relative: 8 %
Neutro Abs: 6.7 10*3/uL (ref 1.7–7.7)
Neutrophils Relative %: 80 %
Platelets: 187 10*3/uL (ref 150–400)
RBC: 4.57 MIL/uL (ref 4.22–5.81)
RDW: 13.6 % (ref 11.5–15.5)
WBC: 8.5 10*3/uL (ref 4.0–10.5)
nRBC: 0 % (ref 0.0–0.2)

## 2020-07-22 LAB — CBG MONITORING, ED
Glucose-Capillary: 139 mg/dL — ABNORMAL HIGH (ref 70–99)
Glucose-Capillary: 83 mg/dL (ref 70–99)

## 2020-07-22 LAB — BASIC METABOLIC PANEL
Anion gap: 10 (ref 5–15)
BUN: 21 mg/dL (ref 8–23)
CO2: 23 mmol/L (ref 22–32)
Calcium: 9.7 mg/dL (ref 8.9–10.3)
Chloride: 106 mmol/L (ref 98–111)
Creatinine, Ser: 2.05 mg/dL — ABNORMAL HIGH (ref 0.61–1.24)
GFR calc Af Amer: 33 mL/min — ABNORMAL LOW (ref >60)
GFR calc non Af Amer: 28 mL/min — ABNORMAL LOW (ref >60)
Glucose, Bld: 92 mg/dL (ref 70–99)
Potassium: 4.7 mmol/L (ref 3.5–5.1)
Sodium: 139 mmol/L (ref 135–145)

## 2020-07-22 MED ORDER — LACTATED RINGERS IV BOLUS
1000.0000 mL | Freq: Once | INTRAVENOUS | Status: AC
  Administered 2020-07-22: 1000 mL via INTRAVENOUS

## 2020-07-22 NOTE — ED Triage Notes (Signed)
Per EMS, Pt from home, was called by son for altered mental status.  Son said he was fine earlier in the day, however when he checked this evening "his sugar was really low."  Pt's son gave him "lots of food" including peanut butter sandwiches and juice.  EMS found his CBG was 86 later 95.  Pt is mentating much better.  124/80 HR72 100% RA 97.9

## 2020-07-22 NOTE — ED Provider Notes (Signed)
[facility] EMERGENCY DEPARTMENT Provider Note   CSN: [id number] Arrival date & time: 07/22/20  0000   History Chief Complaint  Patient presents with  . Altered Mental Status    Eddie Graves is a 86 y.o. male.  The history is provided by the patient and the EMS personnel.  Altered Mental Status Hypoglycemia Associated symptoms: altered mental status   He has history of hypertension, diabetes, COPD, blindness and was brought in by ambulance because of altered mental status.  His son reportedly found him at home very confused and checked his blood sugar and noted that it was low and gave him a large amount of food.  EMS noted initial CBG of 86, and repeat was 95.  Patient has no memory of anything happening at home, but states that he feels fine now.  He does admit that he does not check his blood sugars at home because he has difficulty with performing the fingerstick, and difficulty reading the machine because of his blindness.  Past Medical History:  Diagnosis Date  . Anxiety state, unspecified   . Blind   . Chronic airway obstruction, not elsewhere classified   . Coronary atherosclerosis of native coronary artery   . Cough   . Depressive disorder, not elsewhere classified   . DM (diabetes mellitus) type II controlled peripheral vascular disorder   . Essential hypertension, benign   . H/O hiatal hernia   . Headache(784.0)   . Legal blindness, as defined in [place]   . Memory loss   . Other and unspecified hyperlipidemia   . Pain in joint, site unspecified   . Palpitations   . Rash and other nonspecific skin eruption   . Reflux esophagitis   . Sebaceous cyst   . Spinal stenosis, lumbar region, with neurogenic claudication   . Type II diabetes mellitus with neurological manifestations, uncontrolled (HCC)   . Unspecified constipation   . Vitamin D deficiency     Patient Active Problem List   Diagnosis Date Noted  . Acute non Q wave myocardial infarction  (HCC) 12/12/2016  . Uncontrolled type 2 diabetes mellitus with diabetic polyneuropathy, with long-term current use of insulin (HCC) 03/05/2016  . Tobacco abuse counseling 03/05/2016  . Right sided abdominal pain 03/05/2016  . Constipation 03/05/2016  . Chest pain at rest 11/06/2015  . Diastolic heart failure (HCC) 11/06/2015  . Acute bronchitis with chronic obstructive pulmonary disease (COPD) (HCC) 11/06/2015  . Neurological deficit, transient 11/06/2015  . Left facial numbness   . AKI (acute kidney injury) (HCC)   . Chest pain 05/19/2015  . History of gonorrhea 05/19/2015  . Hypotension 05/19/2015  . CKD (chronic kidney disease), stage III 05/19/2015  . Involuntary muscle contractions 10/12/2014  . Seizure disorder after [facility] 08/19/2014  . Diabetic peripheral neuropathy associated with type 2 diabetes mellitus (HCC) 07/26/2014  . Blind 07/26/2014  . Tobacco abuse 04/26/2014  . Obesity (BMI 30-39.9) 08/18/2013  . COPD exacerbation (HCC) 08/06/2013  . SAH (subarachnoid hemorrhage) (HCC) 05/30/2013  . Coronary atherosclerosis of native coronary artery   . Vitamin D deficiency   . Memory loss   . COPD (chronic obstructive pulmonary disease) (HCC) 03/16/2013  . Essential hypertension, benign 03/16/2013  . Depressive disorder, not elsewhere classified 03/16/2013  . Spinal stenosis, lumbar region, with neurogenic claudication 03/16/2013    Past Surgical History:  Procedure Laterality Date  . CARDIAC CATHETERIZATION N/A 12/15/2016   Procedure: Left Heart Cath and Coronary Angiography;  Surgeon: Rinaldo Cloud, MD;  Location: [facility] INVASIVE CV LAB;  Service: Cardiovascular;  Laterality: N/A;  . CORONARY ANGIOPLASTY WITH STENT PLACEMENT    . EYE SURGERY     right prosthetic globe  . HERNIA REPAIR         Family History  Problem Relation Age of Onset  . Asthma Mother   . Diabetes Son   . Cancer Daughter   . Diabetes Son     Social History   Tobacco Use  . Smoking status: Light  Tobacco Smoker    Years: 50.00    Types: Cigarettes    Last attempt to quit: 03/07/2013    Years since quitting: 7.3  . Smokeless tobacco: Never Used  . Tobacco comment: Patient say he smokes 4 cigs. per day  Substance Use Topics  . Alcohol use: No    Alcohol/week: 0.0 standard drinks  . Drug use: No    Home Medications Prior to Admission medications   Medication Sig Start Date End Date Taking? Authorizing Provider  amLODipine (NORVASC) 10 MG tablet Take 5 mg by mouth daily.  01/09/19   Rinaldo Cloud, MD  aspirin EC 81 MG tablet 1/2 tablet by mouth daily per Cardiologist 11/05/16   Renato Gails, Tiffany L, DO  Cholecalciferol (VITAMIN D3) 2000 units capsule TAKE 1 CAPSULE (2,000 UNITS TOTAL) BY MOUTH DAILY. FOR VITAMIN DAY REPLETION 01/17/18   Reed, Tiffany L, DO  clopidogrel (PLAVIX) 75 MG tablet Take 1 tablet (75 mg total) by mouth daily with breakfast. 12/17/16   Rinaldo Cloud, MD  CRESTOR 20 MG tablet Take 20 mg by mouth daily. For cholesterol 08/13/14   [provider]  diclofenac sodium (VOLTAREN) 1 % GEL Apply to affected joints four times daily 12/21/18   [provider]  doxycycline (VIBRAMYCIN) 100 MG capsule Take 1 capsule (100 mg total) by mouth 2 (two) times daily. 01/31/19   Gilda Crease, MD  esomeprazole (NEXIUM) 40 MG capsule TAKE ONE CAPSULE BY MOUTH ONCE DAILY BEFORE BREAKFAST FOR STOMACH 01/17/18   Reed, Tiffany L, DO  gabapentin (NEURONTIN) 300 MG capsule Take 1 capsule by mouth 3 (three) times daily. 01/09/19   Rinaldo Cloud, MD  GARLIC PO Take 7,371 mcg by mouth daily.     [provider]  glucose blood (PRODIGY NO CODING BLOOD GLUC) test strip PRODIGY TEST STRIPS, CHECK BLOOD SUGAR TWICE DAILY DX: E11.65, H54.7 PATIENT WITH BLINDNESS AND NEEDS STRIPS WITH READ ALOUD MONITOR 03/08/18   Reed, Tiffany L, DO  Insulin Lispro Prot & Lispro ([name] 75/25 KWIKPEN) (75-25) 100 UNIT/ML Kwikpen Inject 40 Units into the skin 2 (two) times daily with a meal.  03/16/17   Reed, Tiffany L, DO  levalbuterol (XOPENEX) 1.25 MG/3ML nebulizer solution TAKE 1.25 MG (ONE AMPULE) BY NEBULIZATION EVERY 4 (FOUR) HOURS AS NEEDED FOR WHEEZING. 01/17/18   Reed, Tiffany L, DO  lisinopril (PRINIVIL,ZESTRIL) 20 MG tablet TAKE 1 TABLET (20 MG TOTAL) BY MOUTH DAILY 01/17/18   Reed, Tiffany L, DO  metoprolol succinate (TOPROL-XL) 100 MG 24 hr tablet Take one tablet by mouth once daily for blood pressure 06/06/15   Reed, Tiffany L, DO  mometasone-formoterol (DULERA) 200-5 MCG/ACT AERO Inhale 2 puffs into the lungs 2 (two) times daily. 12/16/16   Rinaldo Cloud, MD  nitroGLYCERIN (NITRODUR - DOSED IN MG/24 HR) 0.4 mg/hr patch Apply 1 patch to the skin daily 01/30/19   [provider]  nitroGLYCERIN (NITROSTAT) 0.4 MG SL tablet Place 1 tablet (0.4 mg total) under the tongue every 5 (  five) minutes as needed for chest pain. 12/16/16   Rinaldo Cloud, MD  Omega 3 1200 MG CAPS Take 2,400 mg by mouth daily. For cholesterol    [provider]  predniSONE (DELTASONE) 20 MG tablet Take 2 tablets (40 mg total) by mouth daily with breakfast. 01/31/19   Pollina, Canary Brim, MD  PROAIR HFA [phone] 9422 Base) MCG/ACT inhaler INHALE TWO PUFFS INTO THE LUNGS EVERY TWO HOURS IF NEEDED 01/17/18   Reed, Tiffany L, DO  sertraline (ZOLOFT) 50 MG tablet TAKE ONE TABLET BY MOUTH ONCE DAILY 01/17/18   Reed, Tiffany L, DO  tiotropium (SPIRIVA) 18 MCG inhalation capsule Place 1 capsule (18 mcg total) into inhaler and inhale daily. 12/17/16   Rinaldo Cloud, MD  TRADJENTA 5 MG TABS tablet TAKE ONE TABLET BY MOUTH ONCE DAILY TO CONTROL BLOOD SUGAR 01/17/18   Reed, Tiffany L, DO    Allergies    Flexeril [cyclobenzaprine] and Nsaids  Review of Systems   Review of Systems  All other systems reviewed and are negative.   Physical Exam Updated Vital Signs BP 125/82   Pulse 70   Resp (!) 25   Ht 5\' 6"  (1.676 m)   Wt 99.8 kg   SpO2 97%   BMI 35.51 kg/m   Physical Exam Vitals and nursing note  reviewed.   86 year old male, resting comfortably and in no acute distress. Vital signs are significant for mildly elevated respiratory rate. Oxygen saturation is 97%, which is normal. Head is normocephalic and atraumatic. PERRLA, EOMI. Oropharynx is clear. Neck is nontender and supple without adenopathy or JVD. Back is nontender and there is no CVA tenderness. Lungs are clear without rales, wheezes, or rhonchi. Chest is nontender. Heart has regular rate and rhythm without murmur. Abdomen is soft, flat, nontender without masses or hepatosplenomegaly and peristalsis is normoactive. Extremities have no cyanosis or edema, full range of motion is present. Skin is warm and dry without rash. Neurologic: Awake, alert, oriented x3, normal speech, cranial nerves are intact, there are no motor or sensory deficits.  ED Results / Procedures / Treatments   Labs (all labs ordered are listed, but only abnormal results are displayed) Labs Reviewed  BASIC METABOLIC PANEL - Abnormal; Notable for the following components:      Result Value   Creatinine, Ser 2.05 (*)    GFR calc non Af Amer 28 (*)    GFR calc Af Amer 33 (*)    All other components within normal limits  CBG MONITORING, ED - Abnormal; Notable for the following components:   Glucose-Capillary 139 (*)    All other components within normal limits  CBC WITH DIFFERENTIAL/PLATELET  URINALYSIS, ROUTINE W REFLEX MICROSCOPIC  CBG MONITORING, ED  CBG MONITORING, ED   Procedures Procedures  Medications Ordered in ED Medications  lactated ringers bolus 1,000 mL (1,000 mLs Intravenous New Bag/Given 07/22/20 0308)    ED Course  I have reviewed the triage vital signs and the nursing notes.  Pertinent labs & imaging results that were available during my care of the patient were reviewed by me and considered in my medical decision making (see chart for details).  MDM Rules/Calculators/A&P Altered mental status which seems to be an episode of  hypoglycemia.  Clinical improvement coincided with raising the glucose.  He appears to have normal mentation now.  Will check screening labs and monitor glucose.  Old records are reviewed, and he has no relevant past visits.  Of note, he has been immunized  against COVID-19.  Glucose has remained at a safe level while in the ED.  Labs show an increase in creatinine over baseline.  Creatinine today is 2.05, was 1.37 on 01/31/2019.  It is unclear if this is an acute kidney injury or natural progression of underlying kidney disease.  He is given a bolus of intravenous fluids.  Recommended that he follow-up with his PCP in 3 days to discuss appropriate ways of monitoring his glucose as an outpatient, and to follow-up regarding his kidney function.  Final Clinical Impression(s) / ED Diagnoses Final diagnoses:  Hypoglycemia  Renal insufficiency    Rx / DC Orders ED Discharge Orders    None       Dione Booze, MD 07/22/20 [id number]

## 2020-07-22 NOTE — ED Notes (Signed)
Pt states he has a lot of trouble taking his blood sugar due to not being able to see it.  Pt did not take his sugar level tonight.  Reports the "insulin gun" gave him his dose tonight.

## 2020-07-22 NOTE — Discharge Instructions (Addendum)
Your creatinine (a blood test of your kidney) was a little high today.  It was 2.05, which is increased from 1.37 1.5 years ago.  It is not clear to me whether this is natural progression of your kidney disease, or something that has just happened in the last few days.  Please follow-up with your primary care provider to discuss appropriate ways to monitor your blood sugar at home, and to follow-up regarding your kidney function.  Return if you are having any problems.

## 2020-08-06 DIAGNOSIS — J449 Chronic obstructive pulmonary disease, unspecified: Secondary | ICD-10-CM | POA: Diagnosis not present

## 2020-08-06 DIAGNOSIS — E1122 Type 2 diabetes mellitus with diabetic chronic kidney disease: Secondary | ICD-10-CM | POA: Diagnosis not present

## 2020-08-06 DIAGNOSIS — I129 Hypertensive chronic kidney disease with stage 1 through stage 4 chronic kidney disease, or unspecified chronic kidney disease: Secondary | ICD-10-CM | POA: Diagnosis not present

## 2020-08-06 DIAGNOSIS — N1832 Chronic kidney disease, stage 3b: Secondary | ICD-10-CM | POA: Diagnosis not present

## 2020-08-15 ENCOUNTER — Ambulatory Visit: Payer: Medicare Other | Admitting: Sports Medicine

## 2020-08-16 DIAGNOSIS — E7800 Pure hypercholesterolemia, unspecified: Secondary | ICD-10-CM | POA: Diagnosis not present

## 2020-08-16 DIAGNOSIS — E1169 Type 2 diabetes mellitus with other specified complication: Secondary | ICD-10-CM | POA: Diagnosis not present

## 2020-08-16 DIAGNOSIS — E785 Hyperlipidemia, unspecified: Secondary | ICD-10-CM | POA: Diagnosis not present

## 2020-08-16 DIAGNOSIS — Z23 Encounter for immunization: Secondary | ICD-10-CM | POA: Diagnosis not present

## 2020-08-30 DIAGNOSIS — E785 Hyperlipidemia, unspecified: Secondary | ICD-10-CM | POA: Diagnosis not present

## 2020-08-30 DIAGNOSIS — E119 Type 2 diabetes mellitus without complications: Secondary | ICD-10-CM | POA: Diagnosis not present

## 2020-08-30 DIAGNOSIS — I10 Essential (primary) hypertension: Secondary | ICD-10-CM | POA: Diagnosis not present

## 2020-09-04 DIAGNOSIS — I10 Essential (primary) hypertension: Secondary | ICD-10-CM | POA: Diagnosis not present

## 2020-09-04 DIAGNOSIS — N189 Chronic kidney disease, unspecified: Secondary | ICD-10-CM | POA: Diagnosis not present

## 2020-09-04 DIAGNOSIS — E119 Type 2 diabetes mellitus without complications: Secondary | ICD-10-CM | POA: Diagnosis not present

## 2020-09-04 DIAGNOSIS — E785 Hyperlipidemia, unspecified: Secondary | ICD-10-CM | POA: Diagnosis not present

## 2020-09-04 DIAGNOSIS — I25118 Atherosclerotic heart disease of native coronary artery with other forms of angina pectoris: Secondary | ICD-10-CM | POA: Diagnosis not present

## 2020-09-20 NOTE — Telephone Encounter (Signed)
error 

## 2020-09-26 ENCOUNTER — Ambulatory Visit: Payer: Medicare Other | Admitting: Sports Medicine

## 2020-09-30 ENCOUNTER — Inpatient Hospital Stay (HOSPITAL_COMMUNITY)
Admission: EM | Admit: 2020-09-30 | Discharge: 2020-10-02 | DRG: 871 | Disposition: A | Discharge status: Home / Self Care | Payer: Medicare Other | Attending: Internal Medicine | Admitting: Internal Medicine

## 2020-09-30 ENCOUNTER — Other Ambulatory Visit: Payer: Self-pay

## 2020-09-30 ENCOUNTER — Emergency Department (HOSPITAL_COMMUNITY): Payer: Medicare Other

## 2020-09-30 ENCOUNTER — Encounter (HOSPITAL_COMMUNITY): Payer: Self-pay | Admitting: Pharmacy Technician

## 2020-09-30 DIAGNOSIS — N1832 Chronic kidney disease, stage 3b: Secondary | ICD-10-CM | POA: Diagnosis present

## 2020-09-30 DIAGNOSIS — J181 Lobar pneumonia, unspecified organism: Secondary | ICD-10-CM | POA: Diagnosis not present

## 2020-09-30 DIAGNOSIS — E785 Hyperlipidemia, unspecified: Secondary | ICD-10-CM | POA: Diagnosis present

## 2020-09-30 DIAGNOSIS — R000 Tachycardia, unspecified: Secondary | ICD-10-CM | POA: Diagnosis not present

## 2020-09-30 DIAGNOSIS — Z833 Family history of diabetes mellitus: Secondary | ICD-10-CM | POA: Diagnosis not present

## 2020-09-30 DIAGNOSIS — Z7902 Long term (current) use of antithrombotics/antiplatelets: Secondary | ICD-10-CM

## 2020-09-30 DIAGNOSIS — F32A Depression, unspecified: Secondary | ICD-10-CM | POA: Diagnosis present

## 2020-09-30 DIAGNOSIS — H548 Legal blindness, as defined in USA: Secondary | ICD-10-CM | POA: Diagnosis not present

## 2020-09-30 DIAGNOSIS — Z955 Presence of coronary angioplasty implant and graft: Secondary | ICD-10-CM | POA: Diagnosis not present

## 2020-09-30 DIAGNOSIS — A419 Sepsis, unspecified organism: Secondary | ICD-10-CM | POA: Diagnosis not present

## 2020-09-30 DIAGNOSIS — Z7982 Long term (current) use of aspirin: Secondary | ICD-10-CM | POA: Diagnosis not present

## 2020-09-30 DIAGNOSIS — R21 Rash and other nonspecific skin eruption: Secondary | ICD-10-CM | POA: Diagnosis not present

## 2020-09-30 DIAGNOSIS — Z825 Family history of asthma and other chronic lower respiratory diseases: Secondary | ICD-10-CM | POA: Diagnosis not present

## 2020-09-30 DIAGNOSIS — F411 Generalized anxiety disorder: Secondary | ICD-10-CM | POA: Diagnosis present

## 2020-09-30 DIAGNOSIS — Z79899 Other long term (current) drug therapy: Secondary | ICD-10-CM | POA: Diagnosis not present

## 2020-09-30 DIAGNOSIS — Z888 Allergy status to other drugs, medicaments and biological substances status: Secondary | ICD-10-CM

## 2020-09-30 DIAGNOSIS — E1122 Type 2 diabetes mellitus with diabetic chronic kidney disease: Secondary | ICD-10-CM | POA: Diagnosis not present

## 2020-09-30 DIAGNOSIS — F17210 Nicotine dependence, cigarettes, uncomplicated: Secondary | ICD-10-CM | POA: Diagnosis present

## 2020-09-30 DIAGNOSIS — I129 Hypertensive chronic kidney disease with stage 1 through stage 4 chronic kidney disease, or unspecified chronic kidney disease: Secondary | ICD-10-CM | POA: Diagnosis not present

## 2020-09-30 DIAGNOSIS — J189 Pneumonia, unspecified organism: Secondary | ICD-10-CM | POA: Diagnosis not present

## 2020-09-30 DIAGNOSIS — J9601 Acute respiratory failure with hypoxia: Secondary | ICD-10-CM | POA: Diagnosis present

## 2020-09-30 DIAGNOSIS — J441 Chronic obstructive pulmonary disease with (acute) exacerbation: Secondary | ICD-10-CM | POA: Diagnosis not present

## 2020-09-30 DIAGNOSIS — I2510 Atherosclerotic heart disease of native coronary artery without angina pectoris: Secondary | ICD-10-CM | POA: Diagnosis present

## 2020-09-30 DIAGNOSIS — Z20822 Contact with and (suspected) exposure to covid-19: Secondary | ICD-10-CM | POA: Diagnosis present

## 2020-09-30 DIAGNOSIS — Z886 Allergy status to analgesic agent status: Secondary | ICD-10-CM

## 2020-09-30 DIAGNOSIS — E1149 Type 2 diabetes mellitus with other diabetic neurological complication: Secondary | ICD-10-CM | POA: Diagnosis present

## 2020-09-30 DIAGNOSIS — J440 Chronic obstructive pulmonary disease with acute lower respiratory infection: Secondary | ICD-10-CM | POA: Diagnosis not present

## 2020-09-30 DIAGNOSIS — Z794 Long term (current) use of insulin: Secondary | ICD-10-CM

## 2020-09-30 DIAGNOSIS — Z7951 Long term (current) use of inhaled steroids: Secondary | ICD-10-CM

## 2020-09-30 DIAGNOSIS — E1151 Type 2 diabetes mellitus with diabetic peripheral angiopathy without gangrene: Secondary | ICD-10-CM | POA: Diagnosis not present

## 2020-09-30 DIAGNOSIS — R0602 Shortness of breath: Secondary | ICD-10-CM | POA: Diagnosis not present

## 2020-09-30 LAB — GLUCOSE, CAPILLARY: Glucose-Capillary: 300 mg/dL — ABNORMAL HIGH (ref 70–99)

## 2020-09-30 LAB — CBC
HCT: 38.1 % — ABNORMAL LOW (ref 39.0–52.0)
Hemoglobin: 12.2 g/dL — ABNORMAL LOW (ref 13.0–17.0)
MCH: 29.2 pg (ref 26.0–34.0)
MCHC: 32 g/dL (ref 30.0–36.0)
MCV: 91.1 fL (ref 80.0–100.0)
Platelets: 267 10*3/uL (ref 150–400)
RBC: 4.18 MIL/uL — ABNORMAL LOW (ref 4.22–5.81)
RDW: 13.7 % (ref 11.5–15.5)
WBC: 19.1 10*3/uL — ABNORMAL HIGH (ref 4.0–10.5)
nRBC: 0 % (ref 0.0–0.2)

## 2020-09-30 LAB — BASIC METABOLIC PANEL
Anion gap: 9 (ref 5–15)
BUN: 31 mg/dL — ABNORMAL HIGH (ref 8–23)
CO2: 21 mmol/L — ABNORMAL LOW (ref 22–32)
Calcium: 8.6 mg/dL — ABNORMAL LOW (ref 8.9–10.3)
Chloride: 104 mmol/L (ref 98–111)
Creatinine, Ser: 1.69 mg/dL — ABNORMAL HIGH (ref 0.61–1.24)
GFR, Estimated: 39 mL/min — ABNORMAL LOW (ref >60)
Glucose, Bld: 282 mg/dL — ABNORMAL HIGH (ref 70–99)
Potassium: 3.9 mmol/L (ref 3.5–5.1)
Sodium: 134 mmol/L — ABNORMAL LOW (ref 135–145)

## 2020-09-30 LAB — TROPONIN I (HIGH SENSITIVITY)
Troponin I (High Sensitivity): 97 ng/L — ABNORMAL HIGH (ref <18)
Troponin I (High Sensitivity): 92 ng/L — ABNORMAL HIGH (ref <18)

## 2020-09-30 LAB — CBG MONITORING, ED: Glucose-Capillary: 277 mg/dL — ABNORMAL HIGH (ref 70–99)

## 2020-09-30 LAB — RESPIRATORY PANEL BY RT PCR (FLU A&B, COVID)
Influenza A by PCR: NEGATIVE
Influenza B by PCR: NEGATIVE
SARS Coronavirus 2 by RT PCR: NEGATIVE

## 2020-09-30 MED ORDER — METHYLPREDNISOLONE SODIUM SUCC 125 MG IJ SOLR
125.0000 mg | Freq: Once | INTRAMUSCULAR | Status: AC
  Administered 2020-09-30: 125 mg via INTRAVENOUS
  Filled 2020-09-30: qty 2

## 2020-09-30 MED ORDER — SODIUM CHLORIDE 0.9 % IV SOLN
500.0000 mg | Freq: Once | INTRAVENOUS | Status: AC
  Administered 2020-09-30: 500 mg via INTRAVENOUS
  Filled 2020-09-30: qty 500

## 2020-09-30 MED ORDER — SODIUM CHLORIDE 0.9 % IV SOLN
500.0000 mg | INTRAVENOUS | Status: DC
  Administered 2020-10-01: 500 mg via INTRAVENOUS
  Filled 2020-09-30 (×2): qty 500

## 2020-09-30 MED ORDER — METHYLPREDNISOLONE SODIUM SUCC 125 MG IJ SOLR
50.0000 mg | Freq: Two times a day (BID) | INTRAMUSCULAR | Status: DC
  Administered 2020-10-01 – 2020-10-02 (×3): 50 mg via INTRAVENOUS
  Filled 2020-09-30 (×3): qty 2

## 2020-09-30 MED ORDER — IPRATROPIUM-ALBUTEROL 0.5-2.5 (3) MG/3ML IN SOLN
3.0000 mL | Freq: Four times a day (QID) | RESPIRATORY_TRACT | Status: DC
  Administered 2020-09-30 – 2020-10-02 (×6): 3 mL via RESPIRATORY_TRACT
  Filled 2020-09-30 (×5): qty 3

## 2020-09-30 MED ORDER — ROSUVASTATIN CALCIUM 20 MG PO TABS
20.0000 mg | ORAL_TABLET | Freq: Every day | ORAL | Status: DC
  Administered 2020-09-30 – 2020-10-01 (×2): 20 mg via ORAL
  Filled 2020-09-30 (×2): qty 1

## 2020-09-30 MED ORDER — PANTOPRAZOLE SODIUM 40 MG PO TBEC
40.0000 mg | DELAYED_RELEASE_TABLET | Freq: Every day | ORAL | Status: DC
  Administered 2020-10-01 – 2020-10-02 (×2): 40 mg via ORAL
  Filled 2020-09-30 (×2): qty 1

## 2020-09-30 MED ORDER — SODIUM CHLORIDE 0.9 % IV SOLN
1.0000 g | Freq: Once | INTRAVENOUS | Status: AC
  Administered 2020-09-30: 1 g via INTRAVENOUS
  Filled 2020-09-30: qty 10

## 2020-09-30 MED ORDER — IPRATROPIUM BROMIDE 0.02 % IN SOLN
0.5000 mg | Freq: Once | RESPIRATORY_TRACT | Status: AC
  Administered 2020-09-30: 0.5 mg via RESPIRATORY_TRACT
  Filled 2020-09-30: qty 2.5

## 2020-09-30 MED ORDER — INSULIN ASPART 100 UNIT/ML ~~LOC~~ SOLN
0.0000 [IU] | Freq: Three times a day (TID) | SUBCUTANEOUS | Status: DC
  Administered 2020-09-30 – 2020-10-01 (×3): 5 [IU] via SUBCUTANEOUS
  Administered 2020-10-01 – 2020-10-02 (×2): 2 [IU] via SUBCUTANEOUS
  Administered 2020-10-02: 3 [IU] via SUBCUTANEOUS

## 2020-09-30 MED ORDER — CLOPIDOGREL BISULFATE 75 MG PO TABS
75.0000 mg | ORAL_TABLET | Freq: Every day | ORAL | Status: DC
  Administered 2020-10-01 – 2020-10-02 (×2): 75 mg via ORAL
  Filled 2020-09-30 (×2): qty 1

## 2020-09-30 MED ORDER — ASPIRIN EC 81 MG PO TBEC
81.0000 mg | DELAYED_RELEASE_TABLET | Freq: Every day | ORAL | Status: DC
  Administered 2020-10-01 – 2020-10-02 (×2): 81 mg via ORAL
  Filled 2020-09-30 (×2): qty 1

## 2020-09-30 MED ORDER — BUDESONIDE 0.25 MG/2ML IN SUSP
0.2500 mg | Freq: Two times a day (BID) | RESPIRATORY_TRACT | Status: DC
  Administered 2020-09-30 – 2020-10-02 (×4): 0.25 mg via RESPIRATORY_TRACT
  Filled 2020-09-30 (×4): qty 2

## 2020-09-30 MED ORDER — INSULIN GLARGINE 100 UNIT/ML ~~LOC~~ SOLN
10.0000 [IU] | Freq: Every day | SUBCUTANEOUS | Status: DC
  Administered 2020-09-30: 10 [IU] via SUBCUTANEOUS
  Filled 2020-09-30 (×2): qty 0.1

## 2020-09-30 MED ORDER — ALBUTEROL SULFATE (2.5 MG/3ML) 0.083% IN NEBU
5.0000 mg | INHALATION_SOLUTION | Freq: Once | RESPIRATORY_TRACT | Status: AC
  Administered 2020-09-30: 5 mg via RESPIRATORY_TRACT
  Filled 2020-09-30: qty 6

## 2020-09-30 MED ORDER — SODIUM CHLORIDE 0.9 % IV SOLN
2.0000 g | INTRAVENOUS | Status: DC
  Administered 2020-10-01 – 2020-10-02 (×2): 2 g via INTRAVENOUS
  Filled 2020-09-30 (×3): qty 20

## 2020-09-30 MED ORDER — AMLODIPINE BESYLATE 5 MG PO TABS
5.0000 mg | ORAL_TABLET | Freq: Every day | ORAL | Status: DC
  Administered 2020-10-01 – 2020-10-02 (×2): 5 mg via ORAL
  Filled 2020-09-30 (×2): qty 1

## 2020-09-30 MED ORDER — MAGNESIUM SULFATE 2 GM/50ML IV SOLN
2.0000 g | Freq: Once | INTRAVENOUS | Status: AC
  Administered 2020-09-30: 2 g via INTRAVENOUS
  Filled 2020-09-30: qty 50

## 2020-09-30 MED ORDER — SERTRALINE HCL 25 MG PO TABS
25.0000 mg | ORAL_TABLET | Freq: Every day | ORAL | Status: DC
  Administered 2020-09-30 – 2020-10-01 (×2): 25 mg via ORAL
  Filled 2020-09-30 (×2): qty 1

## 2020-09-30 MED ORDER — METOPROLOL SUCCINATE ER 100 MG PO TB24
100.0000 mg | ORAL_TABLET | Freq: Every day | ORAL | Status: DC
  Administered 2020-10-01 – 2020-10-02 (×2): 100 mg via ORAL
  Filled 2020-09-30 (×2): qty 1

## 2020-09-30 MED ORDER — HEPARIN SODIUM (PORCINE) 5000 UNIT/ML IJ SOLN
5000.0000 [IU] | Freq: Three times a day (TID) | INTRAMUSCULAR | Status: DC
  Administered 2020-09-30 – 2020-10-02 (×5): 5000 [IU] via SUBCUTANEOUS
  Filled 2020-09-30 (×5): qty 1

## 2020-09-30 NOTE — Plan of Care (Signed)
  Problem: Safety: Goal: Ability to remain free from injury will improve Outcome: Progressing   

## 2020-09-30 NOTE — ED Triage Notes (Signed)
Pt bib ems from home with increasing shob for the last few days. Pt also c/o chest pain. Pt has been out of his inhalers X3 weeks. Pt given duoneb tx en route. Took NTG pta. Pt with rhonchi all lobes. 180/90, HR 110, RR 25, 91% RA.

## 2020-09-30 NOTE — ED Notes (Signed)
Pt put on 2 l [place] 

## 2020-09-30 NOTE — ED Provider Notes (Signed)
[facility] EMERGENCY DEPARTMENT Provider Note   CSN: [id number] Arrival date & time: 09/30/20  1404     History Chief Complaint  Patient presents with  . Shortness of Breath  . Chest Pain    Trinidad Wicker is a 86 y.o. male.  Patient is an 86 year old male with a history of diabetes, COPD on inhalers at home, CAD and hypertension who is presenting today with complaint of shortness of breath.  Patient reports for the last 3 weeks he has had worsening cough, wheezing and shortness of breath.  He was using an inhaler at home which initially was working but then he ran out of his inhalers and the shortness of breath is just gotten worse.  He is having an ongoing cough but no significant sputum production.  He denies any fevers and reports intermittently he is having pain with coughing but denies any chest pain at this time.  He has had no swelling in his lower extremities and denies any abdominal pain.  No vomiting or diarrhea.  Patient has received his Covid vaccine and has not had any positive contacts that he is aware of.  Patient did receive a DuoNeb in route by EMS and reports he thinks his breathing is a little bit better.  He does not wear oxygen at home.  The history is provided by the patient.  Shortness of Breath Severity:  Severe Onset quality:  Gradual Duration:  3 weeks Timing:  Constant Progression:  Worsening Chronicity:  Recurrent Context comment:  Ran out of inhalers  Relieved by:  None tried Worsened by:  Activity and coughing Ineffective treatments:  Rest Associated symptoms: chest pain, cough and wheezing   Associated symptoms: no fever, no sputum production and no vomiting   Chest Pain Associated symptoms: cough and shortness of breath   Associated symptoms: no fever and no vomiting        Past Medical History:  Diagnosis Date  . Anxiety state, unspecified   . Blind   . Chronic airway obstruction, not elsewhere classified   . Coronary  atherosclerosis of native coronary artery   . Cough   . Depressive disorder, not elsewhere classified   . DM (diabetes mellitus) type II controlled peripheral vascular disorder   . Essential hypertension, benign   . H/O hiatal hernia   . Headache(784.0)   . Legal blindness, as defined in [place]   . Memory loss   . Other and unspecified hyperlipidemia   . Pain in joint, site unspecified   . Palpitations   . Rash and other nonspecific skin eruption   . Reflux esophagitis   . Sebaceous cyst   . Spinal stenosis, lumbar region, with neurogenic claudication   . Type II diabetes mellitus with neurological manifestations, uncontrolled (HCC)   . Unspecified constipation   . Vitamin D deficiency     Patient Active Problem List   Diagnosis Date Noted  . Acute non Q wave myocardial infarction (HCC) 12/12/2016  . Uncontrolled type 2 diabetes mellitus with diabetic polyneuropathy, with long-term current use of insulin (HCC) 03/05/2016  . Tobacco abuse counseling 03/05/2016  . Right sided abdominal pain 03/05/2016  . Constipation 03/05/2016  . Chest pain at rest 11/06/2015  . Diastolic heart failure (HCC) 11/06/2015  . Acute bronchitis with chronic obstructive pulmonary disease (COPD) (HCC) 11/06/2015  . Neurological deficit, transient 11/06/2015  . Left facial numbness   . AKI (acute kidney injury) (HCC)   . Chest pain 05/19/2015  . History  of gonorrhea 05/19/2015  . Hypotension 05/19/2015  . CKD (chronic kidney disease), stage III (HCC) 05/19/2015  . Involuntary muscle contractions 10/12/2014  . Seizure disorder after [facility] 08/19/2014  . Diabetic peripheral neuropathy associated with type 2 diabetes mellitus (HCC) 07/26/2014  . Blind 07/26/2014  . Tobacco abuse 04/26/2014  . Obesity (BMI 30-39.9) 08/18/2013  . COPD exacerbation (HCC) 08/06/2013  . SAH (subarachnoid hemorrhage) (HCC) 05/30/2013  . Coronary atherosclerosis of native coronary artery   . Vitamin D deficiency   . Memory loss    . COPD (chronic obstructive pulmonary disease) (HCC) 03/16/2013  . Essential hypertension, benign 03/16/2013  . Depressive disorder, not elsewhere classified 03/16/2013  . Spinal stenosis, lumbar region, with neurogenic claudication 03/16/2013    Past Surgical History:  Procedure Laterality Date  . CARDIAC CATHETERIZATION N/A 12/15/2016   Procedure: Left Heart Cath and Coronary Angiography;  Surgeon: Rinaldo Cloud, MD;  Location: [facility] INVASIVE CV LAB;  Service: Cardiovascular;  Laterality: N/A;  . CORONARY ANGIOPLASTY WITH STENT PLACEMENT    . EYE SURGERY     right prosthetic globe  . HERNIA REPAIR         Family History  Problem Relation Age of Onset  . Asthma Mother   . Diabetes Son   . Cancer Daughter   . Diabetes Son     Social History   Tobacco Use  . Smoking status: Light Tobacco Smoker    Years: 50.00    Types: Cigarettes    Last attempt to quit: 03/07/2013    Years since quitting: 7.5  . Smokeless tobacco: Never Used  . Tobacco comment: Patient say he smokes 4 cigs. per day  Substance Use Topics  . Alcohol use: No    Alcohol/week: 0.0 standard drinks  . Drug use: No    Home Medications Prior to Admission medications   Medication Sig Start Date End Date Taking? Authorizing Provider  amLODipine (NORVASC) 10 MG tablet Take 5 mg by mouth daily.  01/09/19   Rinaldo Cloud, MD  aspirin EC 81 MG tablet 1/2 tablet by mouth daily per Cardiologist 11/05/16   Renato Gails, Tiffany L, DO  Cholecalciferol (VITAMIN D3) 2000 units capsule TAKE 1 CAPSULE (2,000 UNITS TOTAL) BY MOUTH DAILY. FOR VITAMIN DAY REPLETION 01/17/18   Reed, Tiffany L, DO  clopidogrel (PLAVIX) 75 MG tablet Take 1 tablet (75 mg total) by mouth daily with breakfast. 12/17/16   Rinaldo Cloud, MD  CRESTOR 20 MG tablet Take 20 mg by mouth daily. For cholesterol 08/13/14   [provider]  diclofenac sodium (VOLTAREN) 1 % GEL Apply to affected joints four times daily 12/21/18   [provider]    doxycycline (VIBRAMYCIN) 100 MG capsule Take 1 capsule (100 mg total) by mouth 2 (two) times daily. 01/31/19   Gilda Crease, MD  esomeprazole (NEXIUM) 40 MG capsule TAKE ONE CAPSULE BY MOUTH ONCE DAILY BEFORE BREAKFAST FOR STOMACH 01/17/18   Reed, Tiffany L, DO  gabapentin (NEURONTIN) 300 MG capsule Take 1 capsule by mouth 3 (three) times daily. 01/09/19   Rinaldo Cloud, MD  GARLIC PO Take 1,610 mcg by mouth daily.     [provider]  glucose blood (PRODIGY NO CODING BLOOD GLUC) test strip PRODIGY TEST STRIPS, CHECK BLOOD SUGAR TWICE DAILY DX: E11.65, H54.7 PATIENT WITH BLINDNESS AND NEEDS STRIPS WITH READ ALOUD MONITOR 03/08/18   Reed, Tiffany L, DO  Insulin Lispro Prot & Lispro ([name] 75/25 KWIKPEN) (75-25) 100 UNIT/ML Kwikpen Inject 40 Units into the  skin 2 (two) times daily with a meal. 03/16/17   Reed, Tiffany L, DO  levalbuterol (XOPENEX) 1.25 MG/3ML nebulizer solution TAKE 1.25 MG (ONE AMPULE) BY NEBULIZATION EVERY 4 (FOUR) HOURS AS NEEDED FOR WHEEZING. 01/17/18   Reed, Tiffany L, DO  lisinopril (PRINIVIL,ZESTRIL) 20 MG tablet TAKE 1 TABLET (20 MG TOTAL) BY MOUTH DAILY 01/17/18   Reed, Tiffany L, DO  metoprolol succinate (TOPROL-XL) 100 MG 24 hr tablet Take one tablet by mouth once daily for blood pressure 06/06/15   Reed, Tiffany L, DO  mometasone-formoterol (DULERA) 200-5 MCG/ACT AERO Inhale 2 puffs into the lungs 2 (two) times daily. 12/16/16   Rinaldo Cloud, MD  nitroGLYCERIN (NITRODUR - DOSED IN MG/24 HR) 0.4 mg/hr patch Apply 1 patch to the skin daily 01/30/19   [provider]  nitroGLYCERIN (NITROSTAT) 0.4 MG SL tablet Place 1 tablet (0.4 mg total) under the tongue every 5 (five) minutes as needed for chest pain. 12/16/16   Rinaldo Cloud, MD  Omega 3 1200 MG CAPS Take 2,400 mg by mouth daily. For cholesterol    [provider]  predniSONE (DELTASONE) 20 MG tablet Take 2 tablets (40 mg total) by mouth daily with breakfast. 01/31/19   Pollina, Canary Brim, MD  PROAIR HFA 108 [phone] Base) MCG/ACT inhaler INHALE TWO PUFFS INTO THE LUNGS EVERY TWO HOURS IF NEEDED 01/17/18   Reed, Tiffany L, DO  sertraline (ZOLOFT) 50 MG tablet TAKE ONE TABLET BY MOUTH ONCE DAILY 01/17/18   Reed, Tiffany L, DO  tiotropium (SPIRIVA) 18 MCG inhalation capsule Place 1 capsule (18 mcg total) into inhaler and inhale daily. 12/17/16   Rinaldo Cloud, MD  TRADJENTA 5 MG TABS tablet TAKE ONE TABLET BY MOUTH ONCE DAILY TO CONTROL BLOOD SUGAR 01/17/18   Reed, Tiffany L, DO    Allergies    Flexeril [cyclobenzaprine] and Nsaids  Review of Systems   Review of Systems  Constitutional: Negative for fever.  Respiratory: Positive for cough, shortness of breath and wheezing. Negative for sputum production.   Cardiovascular: Positive for chest pain.  Gastrointestinal: Negative for vomiting.  All other systems reviewed and are negative.   Physical Exam Updated Vital Signs BP (!) 150/77   Pulse (!) 104   Temp 97.9 F (36.6 C) (Oral)   Resp 20   SpO2 94%   Physical Exam Vitals and nursing note reviewed.  Constitutional:      General: He is not in acute distress.    Appearance: He is well-developed and normal weight.  HENT:     Head: Normocephalic and atraumatic.  Eyes:     Conjunctiva/sclera: Conjunctivae normal.     Pupils: Pupils are equal, round, and reactive to light.  Cardiovascular:     Rate and Rhythm: Regular rhythm. Tachycardia present.     Heart sounds: No murmur heard.   Pulmonary:     Effort: Tachypnea and accessory muscle usage present. No respiratory distress.     Breath sounds: Wheezing present. No rales.  Abdominal:     General: There is no distension.     Palpations: Abdomen is soft.     Tenderness: There is no abdominal tenderness. There is no guarding or rebound.  Musculoskeletal:        General: No tenderness. Normal range of motion.     Cervical back: Normal range of motion and neck supple.     Right lower leg: No tenderness. No edema.      Left lower leg: No tenderness. No edema.  Skin:    General: Skin is warm and dry.     Capillary Refill: Capillary refill takes less than 2 seconds.     Findings: No erythema or rash.  Neurological:     General: No focal deficit present.     Mental Status: He is alert and oriented to person, place, and time. Mental status is at baseline.  Psychiatric:        Mood and Affect: Mood normal.        Behavior: Behavior normal.        Thought Content: Thought content normal.     ED Results / Procedures / Treatments   Labs (all labs ordered are listed, but only abnormal results are displayed) Labs Reviewed  BASIC METABOLIC PANEL - Abnormal; Notable for the following components:      Result Value   Sodium 134 (*)    CO2 21 (*)    Glucose, Bld 282 (*)    BUN 31 (*)    Creatinine, Ser 1.69 (*)    Calcium 8.6 (*)    GFR, Estimated 39 (*)    All other components within normal limits  CBC - Abnormal; Notable for the following components:   WBC 19.1 (*)    RBC 4.18 (*)    Hemoglobin 12.2 (*)    HCT 38.1 (*)    All other components within normal limits  TROPONIN I (HIGH SENSITIVITY) - Abnormal; Notable for the following components:   Troponin I (High Sensitivity) 97 (*)    All other components within normal limits  RESPIRATORY PANEL BY RT PCR (FLU A&B, COVID)    EKG EKG Interpretation  Date/Time:  Monday September 30 2020 14:05:36 EDT Ventricular Rate:  106 PR Interval:    QRS Duration: 97 QT Interval:  341 QTC Calculation: 453 R Axis:   -89 Text Interpretation: Sinus tachycardia new Ventricular trigeminy Probable left atrial enlargement Left anterior fascicular block Anterior infarct, old Confirmed by Gwyneth Sprout ([id number]) on 09/30/2020 2:13:53 PM   Radiology DG Chest 2 View  Result Date: 09/30/2020 CLINICAL DATA:  Chest pain, shortness of breath. EXAM: CHEST - 2 VIEW COMPARISON:  January 31, 2019. FINDINGS: Stable cardiomediastinal silhouette. No pneumothorax is noted.  Right lung is clear. Interval development of airspace opacity involving superior segment of left lower lobe consistent with pneumonia. Bony thorax is unremarkable. IMPRESSION: Interval development of left lower lobe pneumonia. Followup PA and lateral chest X-ray is recommended in 3-4 weeks following trial of antibiotic therapy to ensure resolution and exclude underlying malignancy. Electronically Signed   By: Lupita Raider M.D.   On: 09/30/2020 15:10    Procedures Procedures (including critical care time)  Medications Ordered in ED Medications  albuterol (PROVENTIL) (2.5 MG/3ML) 0.083% nebulizer solution 5 mg (has no administration in time range)  ipratropium (ATROVENT) nebulizer solution 0.5 mg (has no administration in time range)  methylPREDNISolone sodium succinate (SOLU-MEDROL) 125 mg/2 mL injection 125 mg (has no administration in time range)  magnesium sulfate IVPB 2 g 50 mL (has no administration in time range)    ED Course  I have reviewed the triage vital signs and the nursing notes.  Pertinent labs & imaging results that were available during my care of the patient were reviewed by me and considered in my medical decision making (see chart for details).    MDM Rules/Calculators/A&P  Elderly male presenting today with complaint of shortness of breath.  Patient is wheezing in all lung fields but oxygen saturation is 94% on room air.  He has no evidence of fluid overload or concern for CHF at this time.  Patient denies any fevers however still concern for possible pneumonia versus COPD exacerbation versus COVID.  Patient is mentating normally.  Low suspicion for ACS at this time.  Patient's EKG with new ventricular trigeminy but no other acute changes.  Chest x-ray pending.  CBC with leukocytosis of 19,000 which is unclear etiology, BMP and troponin are pending. Patient given Solu-Medrol, magnesium and albuterol/Atrovent.  Will reassess.  3:38 PM CMP at  baseline and BP remains stable.  On re-eval pt still wheezing but work of breathing improving although wheezing still pronounced.  CXR with LLL pneumonia.  Pt covered with abx.  MDM Number of Diagnoses or Management Options   Amount and/or Complexity of Data Reviewed Clinical lab tests: ordered and reviewed Tests in the radiology section of CPT: ordered and reviewed Tests in the medicine section of CPT: ordered and reviewed Decide to obtain previous medical records or to obtain history from someone other than the patient: yes Obtain history from someone other than the patient: yes Review and summarize past medical records: yes Discuss the patient with other providers: yes Independent visualization of images, tracings, or specimens: yes  Risk of Complications, Morbidity, and/or Mortality Presenting problems: high Diagnostic procedures: low Management options: moderate  Critical Care Total time providing critical care: 30-74 minutes  Patient Progress Patient progress: stable  CRITICAL CARE Performed by: Aniza Shor Total critical care time: 30 minutes Critical care time was exclusive of separately billable procedures and treating other patients. Critical care was necessary to treat or prevent imminent or life-threatening deterioration. Critical care was time spent personally by me on the following activities: development of treatment plan with patient and/or surrogate as well as nursing, discussions with consultants, evaluation of patient's response to treatment, examination of patient, obtaining history from patient or surrogate, ordering and performing treatments and interventions, ordering and review of laboratory studies, ordering and review of radiographic studies, pulse oximetry and re-evaluation of patient's condition.   Final Clinical Impression(s) / ED Diagnoses Final diagnoses:  COPD exacerbation (HCC)  Community acquired pneumonia of left lower lobe of lung     Rx / DC Orders ED Discharge Orders    None       Gwyneth Sprout, MD 09/30/20 1549

## 2020-09-30 NOTE — H&P (Addendum)
History and Physical    Eddie Graves VFI:[id number] DOB: January 22, 1931 DOA: 09/30/2020  I have briefly reviewed the patient's prior medical records in [facility]  PCP: Renaye Rakers, MD  Patient coming from: home  Chief Complaint: Shortness of breath  HPI: Eddie Graves is a 86 y.o. male with medical history significant of tobacco use, quit a week ago, DM 2, hypertension, legal blindness, hypertension, hyperlipidemia, probable COPD, comes into the hospital with complaints of shortness of breath.  Patient has been feeling short of breath for the past couple weeks but more so in the last 2 to 3 days which prompted him to come to the ER.  He denies any fever or chills.  He reports a cough for the past couple of days.  He also has been having intermittent chest pain and has been taking sublingual nitroglycerin at home.  He also has noticed a lot of wheezing at home.  He denies any abdominal pain, no nausea, no vomiting, no diarrhea.  He quit smoking about a week ago.  ED Course: In the ED he is afebrile 97.9, tachypneic with respiratory rate in the 20s, heart rate is 100 and blood pressure in the 140s-150s systolic.  He satting well on room air.  His blood work shows a creatinine 1.69, glucose 282, WBC is 19, Covid is negative and a chest x-ray shows Left lower lobe pneumonia.  He was given breathing treatments, antibiotics, was still wheezing and we were asked to admit.  Review of Systems: All systems reviewed, and apart from HPI, all negative  Past Medical History:  Diagnosis Date  . Anxiety state, unspecified   . Blind   . Chronic airway obstruction, not elsewhere classified   . Coronary atherosclerosis of native coronary artery   . Cough   . Depressive disorder, not elsewhere classified   . DM (diabetes mellitus) type II controlled peripheral vascular disorder   . Essential hypertension, benign   . H/O hiatal hernia   . Headache(784.0)   . Legal blindness, as defined in [place]   .  Memory loss   . Other and unspecified hyperlipidemia   . Pain in joint, site unspecified   . Palpitations   . Rash and other nonspecific skin eruption   . Reflux esophagitis   . Sebaceous cyst   . Spinal stenosis, lumbar region, with neurogenic claudication   . Type II diabetes mellitus with neurological manifestations, uncontrolled (HCC)   . Unspecified constipation   . Vitamin D deficiency     Past Surgical History:  Procedure Laterality Date  . CARDIAC CATHETERIZATION N/A 12/15/2016   Procedure: Left Heart Cath and Coronary Angiography;  Surgeon: Rinaldo Cloud, MD;  Location: [facility] INVASIVE CV LAB;  Service: Cardiovascular;  Laterality: N/A;  . CORONARY ANGIOPLASTY WITH STENT PLACEMENT    . EYE SURGERY     right prosthetic globe  . HERNIA REPAIR       reports that he has been smoking cigarettes. He has smoked for the past 50.00 years. He has never used smokeless tobacco. He reports that he does not drink alcohol and does not use drugs.  Allergies  Allergen Reactions  . Flexeril [Cyclobenzaprine] Other (See Comments)    delirium  . Nsaids Other (See Comments)    Renal failure    Family History  Problem Relation Age of Onset  . Asthma Mother   . Diabetes Son   . Cancer Daughter   . Diabetes Son     Prior to  Admission medications   Medication Sig Start Date End Date Taking? Authorizing Provider  amLODipine (NORVASC) 10 MG tablet Take 5 mg by mouth daily.  01/09/19   Rinaldo Cloud, MD  aspirin EC 81 MG tablet 1/2 tablet by mouth daily per Cardiologist 11/05/16   Renato Gails, Tiffany L, DO  Cholecalciferol (VITAMIN D3) 2000 units capsule TAKE 1 CAPSULE (2,000 UNITS TOTAL) BY MOUTH DAILY. FOR VITAMIN DAY REPLETION 01/17/18   Reed, Tiffany L, DO  clopidogrel (PLAVIX) 75 MG tablet Take 1 tablet (75 mg total) by mouth daily with breakfast. 12/17/16   Rinaldo Cloud, MD  CRESTOR 20 MG tablet Take 20 mg by mouth daily. For cholesterol 08/13/14   [provider]  diclofenac sodium  (VOLTAREN) 1 % GEL Apply to affected joints four times daily 12/21/18   [provider]  doxycycline (VIBRAMYCIN) 100 MG capsule Take 1 capsule (100 mg total) by mouth 2 (two) times daily. 01/31/19   Gilda Crease, MD  esomeprazole (NEXIUM) 40 MG capsule TAKE ONE CAPSULE BY MOUTH ONCE DAILY BEFORE BREAKFAST FOR STOMACH 01/17/18   Reed, Tiffany L, DO  gabapentin (NEURONTIN) 300 MG capsule Take 1 capsule by mouth 3 (three) times daily. 01/09/19   Rinaldo Cloud, MD  GARLIC PO Take 5,697 mcg by mouth daily.     [provider]  glucose blood (PRODIGY NO CODING BLOOD GLUC) test strip PRODIGY TEST STRIPS, CHECK BLOOD SUGAR TWICE DAILY DX: E11.65, H54.7 PATIENT WITH BLINDNESS AND NEEDS STRIPS WITH READ ALOUD MONITOR 03/08/18   Reed, Tiffany L, DO  Insulin Lispro Prot & Lispro ([name] 75/25 KWIKPEN) (75-25) 100 UNIT/ML Kwikpen Inject 40 Units into the skin 2 (two) times daily with a meal. 03/16/17   Reed, Tiffany L, DO  levalbuterol (XOPENEX) 1.25 MG/3ML nebulizer solution TAKE 1.25 MG (ONE AMPULE) BY NEBULIZATION EVERY 4 (FOUR) HOURS AS NEEDED FOR WHEEZING. 01/17/18   Reed, Tiffany L, DO  lisinopril (PRINIVIL,ZESTRIL) 20 MG tablet TAKE 1 TABLET (20 MG TOTAL) BY MOUTH DAILY 01/17/18   Reed, Tiffany L, DO  metoprolol succinate (TOPROL-XL) 100 MG 24 hr tablet Take one tablet by mouth once daily for blood pressure 06/06/15   Reed, Tiffany L, DO  mometasone-formoterol (DULERA) 200-5 MCG/ACT AERO Inhale 2 puffs into the lungs 2 (two) times daily. 12/16/16   Rinaldo Cloud, MD  nitroGLYCERIN (NITRODUR - DOSED IN MG/24 HR) 0.4 mg/hr patch Apply 1 patch to the skin daily 01/30/19   [provider]  nitroGLYCERIN (NITROSTAT) 0.4 MG SL tablet Place 1 tablet (0.4 mg total) under the tongue every 5 (five) minutes as needed for chest pain. 12/16/16   Rinaldo Cloud, MD  Omega 3 1200 MG CAPS Take 2,400 mg by mouth daily. For cholesterol    [provider]  predniSONE (DELTASONE) 20 MG  tablet Take 2 tablets (40 mg total) by mouth daily with breakfast. 01/31/19   Pollina, Canary Brim, MD  PROAIR HFA 108 [phone] Base) MCG/ACT inhaler INHALE TWO PUFFS INTO THE LUNGS EVERY TWO HOURS IF NEEDED 01/17/18   Reed, Tiffany L, DO  sertraline (ZOLOFT) 50 MG tablet TAKE ONE TABLET BY MOUTH ONCE DAILY 01/17/18   Reed, Tiffany L, DO  tiotropium (SPIRIVA) 18 MCG inhalation capsule Place 1 capsule (18 mcg total) into inhaler and inhale daily. 12/17/16   Rinaldo Cloud, MD  TRADJENTA 5 MG TABS tablet TAKE ONE TABLET BY MOUTH ONCE DAILY TO CONTROL BLOOD SUGAR 01/17/18   Kermit Balo, DO    Physical Exam: Vitals:  09/30/20 1415 09/30/20 1430 09/30/20 1445  BP: (!) 150/77 (!) 153/75   Pulse: (!) 104 (!) 103 100  Resp: 20 (!) 25 19  Temp: 97.9 F (36.6 C)    TempSrc: Oral    SpO2: 94% 96% 99%      Constitutional: NAD, calm, comfortable Eyes: PERRL, lids and conjunctivae normal ENMT: Mucous membranes are moist. Posterior pharynx clear of any exudate or lesions.Normal dentition.  Neck: normal, supple Respiratory: Diffuse bilateral wheezing, no crackles, moves air well. Cardiovascular: Regular rate and rhythm, no murmurs / rubs / gallops. No extremity edema. Abdomen: no tenderness, no masses palpated. Bowel sounds positive.  Musculoskeletal: no clubbing / cyanosis. Normal muscle tone.  Skin: no rashes, lesions, ulcers. No induration Neurologic: CN 2-12 grossly intact. Strength 5/5 in all 4.  Psychiatric: Normal judgment and insight. Alert and oriented x 3. Normal mood.   Labs on Admission: I have personally reviewed following labs and imaging studies  CBC: Recent Labs  Lab 09/30/20 1418  WBC 19.1*  HGB 12.2*  HCT 38.1*  MCV 91.1  PLT 267   Basic Metabolic Panel: Recent Labs  Lab 09/30/20 1418  NA 134*  K 3.9  CL 104  CO2 21*  GLUCOSE 282*  BUN 31*  CREATININE 1.69*  CALCIUM 8.6*   Liver Function Tests: No results for input(s): AST, ALT, ALKPHOS, BILITOT, PROT,  ALBUMIN in the last 168 hours. Coagulation Profile: No results for input(s): INR, PROTIME in the last 168 hours. BNP (last 3 results) No results for input(s): PROBNP in the last 8760 hours. CBG: No results for input(s): GLUCAP in the last 168 hours. Thyroid Function Tests: No results for input(s): TSH, T4TOTAL, FREET4, T3FREE, THYROIDAB in the last 72 hours. Urine analysis:    Component Value Date/Time   COLORURINE YELLOW 05/19/2015 1700   APPEARANCEUR CLEAR 05/19/2015 1700   LABSPEC 1.010 05/19/2015 1700   PHURINE 6.5 05/19/2015 1700   GLUCOSEU NEGATIVE 05/19/2015 1700   HGBUR NEGATIVE 05/19/2015 1700   BILIRUBINUR NEGATIVE 05/19/2015 1700   KETONESUR NEGATIVE 05/19/2015 1700   PROTEINUR NEGATIVE 05/19/2015 1700   UROBILINOGEN 0.2 05/19/2015 1700   NITRITE NEGATIVE 05/19/2015 1700   LEUKOCYTESUR NEGATIVE 05/19/2015 1700     Radiological Exams on Admission: DG Chest 2 View  Result Date: 09/30/2020 CLINICAL DATA:  Chest pain, shortness of breath. EXAM: CHEST - 2 VIEW COMPARISON:  January 31, 2019. FINDINGS: Stable cardiomediastinal silhouette. No pneumothorax is noted. Right lung is clear. Interval development of airspace opacity involving superior segment of left lower lobe consistent with pneumonia. Bony thorax is unremarkable. IMPRESSION: Interval development of left lower lobe pneumonia. Followup PA and lateral chest X-ray is recommended in 3-4 weeks following trial of antibiotic therapy to ensure resolution and exclude underlying malignancy. Electronically Signed   By: Lupita Raider M.D.   On: 09/30/2020 15:10    EKG: Independently reviewed.  Sinus rhythm  Assessment/Plan  Principal Problem COPD exacerbation, lobar pneumonia -Patient will be admitted to the hospital, currently satting well on room air.  He still had persistent wheezing, continue nebulizers, place patient on steroids.  He was started on ceftriaxone and azithromycin in the ED, continue. -Send cultures,  unfortunately already received antibiotics  Active Problems Chest pain /CAD -Has a history of CAD, patient reports 4 stents.  Cycle troponin, monitor. Seeing Dr Sharyn Lull as an outpatient  Type 2 diabetes mellitus -Placed on sliding scale, obtain A1c. Placed on low-dose Lantus, hold home 70/30  Essential hypertension -Resume home metoprolol, hold  lisinopril and monitor renal function. Home meds not reconciled by pharmacy yet  Chronic kidney disease stage IIIb -Baseline creatinine 1.3-1.5, as high as 2 earlier this year, currently at 1.69.  Monitor, avoid nephrotoxic agents  Depression -Resume home medication  Tobacco use, in remission -Quit a week ago, encouraged ongoing cessation   DVT prophylaxis: Heparin Code Status: Full code per patient's wishes Family Communication: No family present at bedside Disposition Plan: Home when ready Bed Type: Telemetry Consults called: None Obs/Inp: Observation   Pamella Pert, MD, PhD [facility]  Contact via [email or web address]  09/30/2020, 4:06 PM

## 2020-10-01 DIAGNOSIS — F17210 Nicotine dependence, cigarettes, uncomplicated: Secondary | ICD-10-CM | POA: Diagnosis present

## 2020-10-01 DIAGNOSIS — I129 Hypertensive chronic kidney disease with stage 1 through stage 4 chronic kidney disease, or unspecified chronic kidney disease: Secondary | ICD-10-CM | POA: Diagnosis present

## 2020-10-01 DIAGNOSIS — E785 Hyperlipidemia, unspecified: Secondary | ICD-10-CM | POA: Diagnosis present

## 2020-10-01 DIAGNOSIS — Z7982 Long term (current) use of aspirin: Secondary | ICD-10-CM | POA: Diagnosis not present

## 2020-10-01 DIAGNOSIS — I2510 Atherosclerotic heart disease of native coronary artery without angina pectoris: Secondary | ICD-10-CM | POA: Diagnosis present

## 2020-10-01 DIAGNOSIS — E1149 Type 2 diabetes mellitus with other diabetic neurological complication: Secondary | ICD-10-CM | POA: Diagnosis present

## 2020-10-01 DIAGNOSIS — Z7902 Long term (current) use of antithrombotics/antiplatelets: Secondary | ICD-10-CM | POA: Diagnosis not present

## 2020-10-01 DIAGNOSIS — J181 Lobar pneumonia, unspecified organism: Secondary | ICD-10-CM | POA: Diagnosis not present

## 2020-10-01 DIAGNOSIS — Z20822 Contact with and (suspected) exposure to covid-19: Secondary | ICD-10-CM | POA: Diagnosis present

## 2020-10-01 DIAGNOSIS — Z79899 Other long term (current) drug therapy: Secondary | ICD-10-CM | POA: Diagnosis not present

## 2020-10-01 DIAGNOSIS — Z955 Presence of coronary angioplasty implant and graft: Secondary | ICD-10-CM | POA: Diagnosis not present

## 2020-10-01 DIAGNOSIS — J440 Chronic obstructive pulmonary disease with acute lower respiratory infection: Secondary | ICD-10-CM | POA: Diagnosis present

## 2020-10-01 DIAGNOSIS — N1832 Chronic kidney disease, stage 3b: Secondary | ICD-10-CM | POA: Diagnosis present

## 2020-10-01 DIAGNOSIS — H548 Legal blindness, as defined in USA: Secondary | ICD-10-CM | POA: Diagnosis present

## 2020-10-01 DIAGNOSIS — F411 Generalized anxiety disorder: Secondary | ICD-10-CM | POA: Diagnosis present

## 2020-10-01 DIAGNOSIS — J9601 Acute respiratory failure with hypoxia: Secondary | ICD-10-CM | POA: Diagnosis present

## 2020-10-01 DIAGNOSIS — F32A Depression, unspecified: Secondary | ICD-10-CM | POA: Diagnosis present

## 2020-10-01 DIAGNOSIS — Z825 Family history of asthma and other chronic lower respiratory diseases: Secondary | ICD-10-CM | POA: Diagnosis not present

## 2020-10-01 DIAGNOSIS — A419 Sepsis, unspecified organism: Secondary | ICD-10-CM | POA: Diagnosis present

## 2020-10-01 DIAGNOSIS — R21 Rash and other nonspecific skin eruption: Secondary | ICD-10-CM | POA: Diagnosis present

## 2020-10-01 DIAGNOSIS — E1122 Type 2 diabetes mellitus with diabetic chronic kidney disease: Secondary | ICD-10-CM | POA: Diagnosis present

## 2020-10-01 DIAGNOSIS — Z833 Family history of diabetes mellitus: Secondary | ICD-10-CM | POA: Diagnosis not present

## 2020-10-01 DIAGNOSIS — E1151 Type 2 diabetes mellitus with diabetic peripheral angiopathy without gangrene: Secondary | ICD-10-CM | POA: Diagnosis present

## 2020-10-01 DIAGNOSIS — J441 Chronic obstructive pulmonary disease with (acute) exacerbation: Secondary | ICD-10-CM | POA: Diagnosis present

## 2020-10-01 LAB — BASIC METABOLIC PANEL
Anion gap: 11 (ref 5–15)
BUN: 39 mg/dL — ABNORMAL HIGH (ref 8–23)
CO2: 19 mmol/L — ABNORMAL LOW (ref 22–32)
Calcium: 8.8 mg/dL — ABNORMAL LOW (ref 8.9–10.3)
Chloride: 104 mmol/L (ref 98–111)
Creatinine, Ser: 1.71 mg/dL — ABNORMAL HIGH (ref 0.61–1.24)
GFR, Estimated: 38 mL/min — ABNORMAL LOW (ref >60)
Glucose, Bld: 297 mg/dL — ABNORMAL HIGH (ref 70–99)
Potassium: 3.8 mmol/L (ref 3.5–5.1)
Sodium: 134 mmol/L — ABNORMAL LOW (ref 135–145)

## 2020-10-01 LAB — CBC WITH DIFFERENTIAL/PLATELET
Abs Immature Granulocytes: 0.23 10*3/uL — ABNORMAL HIGH (ref 0.00–0.07)
Basophils Absolute: 0.1 10*3/uL (ref 0.0–0.1)
Basophils Relative: 0 %
Eosinophils Absolute: 0 10*3/uL (ref 0.0–0.5)
Eosinophils Relative: 0 %
HCT: 35.4 % — ABNORMAL LOW (ref 39.0–52.0)
Hemoglobin: 11.7 g/dL — ABNORMAL LOW (ref 13.0–17.0)
Immature Granulocytes: 1 %
Lymphocytes Relative: 2 %
Lymphs Abs: 0.4 10*3/uL — ABNORMAL LOW (ref 0.7–4.0)
MCH: 29.5 pg (ref 26.0–34.0)
MCHC: 33.1 g/dL (ref 30.0–36.0)
MCV: 89.4 fL (ref 80.0–100.0)
Monocytes Absolute: 0.6 10*3/uL (ref 0.1–1.0)
Monocytes Relative: 3 %
Neutro Abs: 16.9 10*3/uL — ABNORMAL HIGH (ref 1.7–7.7)
Neutrophils Relative %: 94 %
Platelets: 315 10*3/uL (ref 150–400)
RBC: 3.96 MIL/uL — ABNORMAL LOW (ref 4.22–5.81)
RDW: 13.7 % (ref 11.5–15.5)
WBC: 18.2 10*3/uL — ABNORMAL HIGH (ref 4.0–10.5)
nRBC: 0 % (ref 0.0–0.2)

## 2020-10-01 LAB — HEMOGLOBIN A1C
Hgb A1c MFr Bld: 6.2 % — ABNORMAL HIGH (ref 4.8–5.6)
Mean Plasma Glucose: 131 mg/dL

## 2020-10-01 LAB — PHOSPHORUS: Phosphorus: 3.2 mg/dL (ref 2.5–4.6)

## 2020-10-01 LAB — GLUCOSE, CAPILLARY
Glucose-Capillary: 178 mg/dL — ABNORMAL HIGH (ref 70–99)
Glucose-Capillary: 193 mg/dL — ABNORMAL HIGH (ref 70–99)
Glucose-Capillary: 254 mg/dL — ABNORMAL HIGH (ref 70–99)
Glucose-Capillary: 285 mg/dL — ABNORMAL HIGH (ref 70–99)

## 2020-10-01 LAB — MAGNESIUM: Magnesium: 2.7 mg/dL — ABNORMAL HIGH (ref 1.7–2.4)

## 2020-10-01 LAB — TROPONIN I (HIGH SENSITIVITY): Troponin I (High Sensitivity): 44 ng/L — ABNORMAL HIGH (ref <18)

## 2020-10-01 LAB — LACTIC ACID, PLASMA: Lactic Acid, Venous: 1.4 mmol/L (ref 0.5–1.9)

## 2020-10-01 LAB — HIV ANTIBODY (ROUTINE TESTING W REFLEX): HIV Screen 4th Generation wRfx: NONREACTIVE

## 2020-10-01 MED ORDER — INSULIN LISPRO PROT & LISPRO (75-25 MIX) 100 UNIT/ML KWIKPEN: 30.0000 [IU] | PEN_INJECTOR | Freq: Two times a day (BID) | SUBCUTANEOUS | Status: DC

## 2020-10-01 MED ORDER — INSULIN ASPART PROT & ASPART (70-30 MIX) 100 UNIT/ML ~~LOC~~ SUSP
30.0000 [IU] | Freq: Two times a day (BID) | SUBCUTANEOUS | Status: DC
  Administered 2020-10-01 – 2020-10-02 (×2): 30 [IU] via SUBCUTANEOUS
  Filled 2020-10-01: qty 10

## 2020-10-01 NOTE — Plan of Care (Signed)

## 2020-10-01 NOTE — Progress Notes (Signed)
PROGRESS NOTE  Eddie Graves  DOB: 1931-02-12  PCP: Lucianne Lei, MD HLK:[id number]  DOA: 09/30/2020  LOS: 0 days   Chief Complaint  Patient presents with  . Shortness of Breath  . Chest Pain   Brief narrative: Eddie Graves is a 86 y.o. male was brought to the ED from home on 10/25 for chest pain, increasing shortness of breath for last few days not responding to inhalers.   Respiratory history significant for chronic daily smoking, COPD, T2DM, HTN, HLD, CAD s/p stents, CKD3b, legal blindness. Symptoms started 3 weeks ago, progressively worsening, particularly hard in last 2 to 3 days.  Associated with wheezing, intermittent chest pain for which he was taking sublingual nitroglycerin at home.  No fever. He says he quit smoking a week ago.  In the ED, patient was afebrile, heart rate elevated to 104, tachypneic to 20s with blood pressure in 150s, Labs show WBC count at 19, creatinine 1.69, glucose 282. Covid PCR negative. Chest x-ray showed left lower lobe pneumonia. Initial treatment given started the ED with antibiotic, breathing treatment.  Patient continued to wheeze. He was admitted to hospital service for further evaluation management.  Subjective: Patient was seen and examined this morning.  Pleasant elderly African-American male.  Remains on low-flow oxygen by nasal cannula.  Not in distress.  Feels better than at presentation. Chart reviewed Blood sugar remains elevated to over 200.  Assessment/Plan: Acute respiratory failure with hypoxia Acute exacerbation of COPD Left lower lobe pneumonia -Presented with worsening shortness of breath for last 2 to 3 weeks. -Chest x-ray finding with left lower lobe pneumonia. -Home meds include Xopenex, Dulera twice daily, Spiriva daily, -Currently on IV Rocephin, IV azithromycin, Solu-Medrol IV twice daily, bronchodilators. -Currently on 2 L nasal cannula. -Continue to monitor.  Sepsis - POA -Met criteria for sepsis on  admission with tachycardia, tachypnea, pneumonia, leukocytosis, AKI -Blood culture sent. -Seems hemodynamically stable.  I would avoid IV fluid at this time. -WC count this morning slightly better at 18.2. Recent Labs  Lab 09/30/20 1418 10/01/20 1017  WBC 19.1* 18.2*  LATICACIDVEN  --  1.4   Chest pain/elevated troponin History of CAD s/p stents, hyperlipidemia -Mentioned chest pain on admission.  Troponin elevated to 97, repeat this morning. -Suspect demand ischemia because of sepsis. -Continue home meds which include nitroglycerin patch, aspirin 81 mg half tab daily, Plavix 75 mg daily, Crestor 20 mg daily, -Continue monitor. -Follows up with Dr. Terrence Dupont as an outpatient. Recent Labs    09/30/20 1418 09/30/20 1730 10/01/20 1017  TROPONINIHS 97* 92* 44*   Type 2 diabetes mellitus -A1c 6.2 on 10/25.   -Home meds include Humalog 75/25-30 units twice daily. -Resume the same.  Blood sugar trending up likely because of steroids. -Continue sliding scale insulin with Accu-Cheks. Recent Labs  Lab 09/30/20 1744 09/30/20 2100 10/01/20 0755 10/01/20 1236  GLUCAP 277* 300* 285* 254*   Essential hypertension -Home meds include Toprol 100 mg daily, amlodipine 5 mg daily, lisinopril 20 mg daily,  -Continue Toprol, amlodipine.  Lisinopril on hold because of AKI.    Chronic kidney disease stage IIIb -Baseline creatinine 1.3-1.5, as high as 2 earlier this year, currently at 1.69.   -Monitor, avoid nephrotoxic agents Recent Labs    07/22/20 0015 09/30/20 1418 10/01/20 1017  BUN 21 31* 39*  CREATININE 2.05* 1.69* 1.71*   Depression -Resume home medication  Tobacco use, in remission -Quit a week ago, encouraged ongoing cessation  Other home meds include Nexium 40  mg daily, Zoloft 50 mg daily,  Mobility: Uses cane/walker at home.  PT eval ordered. Code Status:   Code Status: Full Code  Nutritional status: Body mass index is 30.1 kg/m.     Diet Order             Diet regular Room service appropriate? No; Fluid consistency: Thin  Diet effective now                 DVT prophylaxis: heparin injection 5,000 Units Start: 09/30/20 2200   Antimicrobials:  IV Rocephin/IV azithromycin Fluid: None Consultants: None Family Communication:  None at bedside  Status is: Observation  The patient will require care spanning > 2 midnights and should be moved to inpatient because: IV treatments appropriate due to intensity of illness or inability to take PO  Dispo: The patient is from: Home              Anticipated d/c is to: Home              Anticipated d/c date is: 1-2 days              Patient currently is not medically stable to d/c.       Infusions:  . azithromycin    . cefTRIAXone (ROCEPHIN)  IV 2 g (10/01/20 0439)    Scheduled Meds: . amLODipine  5 mg Oral Daily  . aspirin EC  81 mg Oral Daily  . budesonide (PULMICORT) nebulizer solution  0.25 mg Nebulization BID  . clopidogrel  75 mg Oral Q breakfast  . heparin  5,000 Units Subcutaneous Q8H  . insulin aspart  0-9 Units Subcutaneous TID WC  . insulin aspart protamine- aspart  30 Units Subcutaneous BID WC  . ipratropium-albuterol  3 mL Nebulization Q6H  . methylPREDNISolone (SOLU-MEDROL) injection  50 mg Intravenous Q12H  . metoprolol succinate  100 mg Oral Daily  . pantoprazole  40 mg Oral Daily  . rosuvastatin  20 mg Oral QHS  . sertraline  25 mg Oral QHS    Antimicrobials: Anti-infectives (From admission, onward)   Start     Dose/Rate Route Frequency Ordered Stop   10/01/20 1900  azithromycin (ZITHROMAX) 500 mg in sodium chloride 0.9 % 250 mL IVPB        500 mg 250 mL/hr over 60 Minutes Intravenous Every 24 hours 09/30/20 2053 10/06/20 1859   10/01/20 0500  cefTRIAXone (ROCEPHIN) 2 g in sodium chloride 0.9 % 100 mL IVPB        2 g 200 mL/hr over 30 Minutes Intravenous Every 24 hours 09/30/20 2053 10/06/20 0459   09/30/20 1545  cefTRIAXone (ROCEPHIN) 1 g in sodium chloride 0.9  % 100 mL IVPB        1 g 200 mL/hr over 30 Minutes Intravenous  Once 09/30/20 1532 09/30/20 1743   09/30/20 1545  azithromycin (ZITHROMAX) 500 mg in sodium chloride 0.9 % 250 mL IVPB        500 mg 250 mL/hr over 60 Minutes Intravenous  Once 09/30/20 1532 09/30/20 2008      PRN meds:    Objective: Vitals:   10/01/20 0846 10/01/20 1511  BP: 122/63 129/84  Pulse: 85 69  Resp:  17  Temp:  98.6 F (37 C)  SpO2:  96%    Intake/Output Summary (Last 24 hours) at 10/01/2020 1521 Last data filed at 10/01/2020 1409 Gross per 24 hour  Intake 256.11 ml  Output 200 ml  Net 56.11 ml  Filed Weights   09/30/20 2046  Weight: 84.6 kg   Weight change:  Body mass index is 30.1 kg/m.   Physical Exam: General exam: Appears calm and comfortable.  Not in physical distress Skin: No rashes, lesions or ulcers. HEENT: Atraumatic, normocephalic, supple neck, no obvious bleeding Lungs: Mild scattered wheezing bilaterally CVS: Regular rate and rhythm, no murmur GI/Abd soft, nontender, nondistended, bowel sound present CNS: Alert, awake, oriented x3 Psychiatry: Mood appropriate Extremities: No pedal edema, no calf tenderness  Data Review: I have personally reviewed the laboratory data and studies available.  Recent Labs  Lab 09/30/20 1418 10/01/20 1017  WBC 19.1* 18.2*  NEUTROABS  --  16.9*  HGB 12.2* 11.7*  HCT 38.1* 35.4*  MCV 91.1 89.4  PLT 267 315   Recent Labs  Lab 09/30/20 1418 10/01/20 1017  NA 134* 134*  K 3.9 3.8  CL 104 104  CO2 21* 19*  GLUCOSE 282* 297*  BUN 31* 39*  CREATININE 1.69* 1.71*  CALCIUM 8.6* 8.8*  MG  --  2.7*  PHOS  --  3.2    F/u labs ordered.  Signed, Terrilee Croak, MD [facility] 10/01/2020

## 2020-10-02 ENCOUNTER — Other Ambulatory Visit (HOSPITAL_COMMUNITY): Payer: Self-pay | Admitting: Internal Medicine

## 2020-10-02 LAB — GLUCOSE, CAPILLARY
Glucose-Capillary: 225 mg/dL — ABNORMAL HIGH (ref 70–99)
Glucose-Capillary: 195 mg/dL — ABNORMAL HIGH (ref 70–99)

## 2020-10-02 LAB — CBC WITH DIFFERENTIAL/PLATELET
Abs Immature Granulocytes: 0.2 10*3/uL — ABNORMAL HIGH (ref 0.00–0.07)
Basophils Absolute: 0 10*3/uL (ref 0.0–0.1)
Basophils Relative: 0 %
Eosinophils Absolute: 0 10*3/uL (ref 0.0–0.5)
Eosinophils Relative: 0 %
HCT: 33.8 % — ABNORMAL LOW (ref 39.0–52.0)
Hemoglobin: 11.2 g/dL — ABNORMAL LOW (ref 13.0–17.0)
Immature Granulocytes: 1 %
Lymphocytes Relative: 2 %
Lymphs Abs: 0.5 10*3/uL — ABNORMAL LOW (ref 0.7–4.0)
MCH: 29.6 pg (ref 26.0–34.0)
MCHC: 33.1 g/dL (ref 30.0–36.0)
MCV: 89.4 fL (ref 80.0–100.0)
Monocytes Absolute: 0.6 10*3/uL (ref 0.1–1.0)
Monocytes Relative: 3 %
Neutro Abs: 20.2 10*3/uL — ABNORMAL HIGH (ref 1.7–7.7)
Neutrophils Relative %: 94 %
Platelets: 347 10*3/uL (ref 150–400)
RBC: 3.78 MIL/uL — ABNORMAL LOW (ref 4.22–5.81)
RDW: 13.8 % (ref 11.5–15.5)
WBC: 21.6 10*3/uL — ABNORMAL HIGH (ref 4.0–10.5)
nRBC: 0 % (ref 0.0–0.2)

## 2020-10-02 LAB — BASIC METABOLIC PANEL
Anion gap: 13 (ref 5–15)
BUN: 46 mg/dL — ABNORMAL HIGH (ref 8–23)
CO2: 20 mmol/L — ABNORMAL LOW (ref 22–32)
Calcium: 8.9 mg/dL (ref 8.9–10.3)
Chloride: 103 mmol/L (ref 98–111)
Creatinine, Ser: 1.67 mg/dL — ABNORMAL HIGH (ref 0.61–1.24)
GFR, Estimated: 39 mL/min — ABNORMAL LOW (ref >60)
Glucose, Bld: 157 mg/dL — ABNORMAL HIGH (ref 70–99)
Potassium: 3.6 mmol/L (ref 3.5–5.1)
Sodium: 136 mmol/L (ref 135–145)

## 2020-10-02 MED ORDER — INSULIN LISPRO PROT & LISPRO (75-25 MIX) 100 UNIT/ML KWIKPEN: 35.0000 [IU] | PEN_INJECTOR | Freq: Two times a day (BID) | SUBCUTANEOUS | Status: DC

## 2020-10-02 MED ORDER — CEFDINIR 300 MG PO CAPS: 600.0000 mg | ORAL_CAPSULE | Freq: Every day | ORAL | 0 refills | Status: DC

## 2020-10-02 MED ORDER — PREDNISONE 10 MG PO TABS: ORAL_TABLET | ORAL | 0 refills | Status: DC

## 2020-10-02 MED ORDER — INSULIN ASPART PROT & ASPART (70-30 MIX) 100 UNIT/ML ~~LOC~~ SUSP
35.0000 [IU] | Freq: Two times a day (BID) | SUBCUTANEOUS | Status: DC
  Filled 2020-10-02: qty 10

## 2020-10-02 MED ORDER — AZITHROMYCIN 500 MG PO TABS: 500.0000 mg | ORAL_TABLET | Freq: Every day | ORAL | 0 refills | Status: DC

## 2020-10-02 MED ORDER — CEFDINIR 300 MG PO CAPS: 300.0000 mg | ORAL_CAPSULE | Freq: Two times a day (BID) | ORAL | 0 refills | Status: AC

## 2020-10-02 NOTE — Progress Notes (Signed)
Patient discharged home with family. AVS printed and reviewed with patient who verbalizes understanding of all discharge instructions. Copy of AVS sent home with patient and family. PIV removed. Patient stable at time of discharge.

## 2020-10-02 NOTE — Evaluation (Signed)
Physical Therapy Evaluation & Discharge Patient Details Name: Eddie Graves MRN: [id number] DOB: [birth year] Today's Date: 10/02/2020   History of Present Illness  Pt is an 86 y.o. male admitted 09/30/20 with chest pain and worsening SOB. Workup for acute respiratory failure with hypoxia, COPD exacerbation, sepsis. CXR with LLL PNA. PMH includes COPD, DM2, HTN, CAD, CKD 3, legal blindness, tobacco use.    Clinical Impression  Patient evaluated by Physical Therapy with no further acute PT needs identified. PTA, pt reports mod indep with walking stick due to blindness; lives alone with family nearby who assist with meals and transportation. Today, pt moving well, requiring HHA for guidance navigating unfamiliar environment without walking stick; able to perform seated/standing ADL tasks independently. All education has been completed and the patient has no further questions. Pt preparing for d/c home this afternoon. Acute PT is signing off. Thank you for this referral.  SpO2 90-95% on RA HR 78    Follow Up Recommendations No PT follow up;Supervision - Intermittent    Equipment Recommendations  None recommended by PT    Recommendations for Other Services       Precautions / Restrictions Precautions Precautions: Fall;Other (comment) Precaution Comments: Legally blind (typically uses white cane but does not have in room) Restrictions Weight Bearing Restrictions: No      Mobility  Bed Mobility Overal bed mobility: Independent             General bed mobility comments: Received sitting at end of bed donning clothes    Transfers Overall transfer level: Needs assistance Equipment used: None Transfers: Sit to/from Stand Sit to Stand: Supervision         General transfer comment: Supervision for safety with visual impairment due to being in unfamiliar environment; pt reaching to feel objects around him well to get his bearings  Ambulation/Gait Ambulation/Gait assistance:  Supervision Gait Distance (Feet): 100 Feet Assistive device:  (hand on PT's shoulder for guidance) Gait Pattern/deviations: Step-through pattern;Decreased stride length Gait velocity: Decreased   General Gait Details: Slow, guarded gait with hand on PT's shoulder for guidance in unfamiliar environment (pt typically uses white cane); no physical assist required to maintain balance/stability; endorses fatigue  Stairs            Wheelchair Mobility    Modified Rankin (Stroke Patients Only)       Balance Overall balance assessment: Needs assistance   Sitting balance-Leahy Scale: Good       Standing balance-Leahy Scale: Good Standing balance comment: Indep to don boxers/pants standing at EOB                             Pertinent Vitals/Pain Pain Assessment: No/denies pain    Home Living Family/patient expects to be discharged to:: Private residence Living Arrangements: Alone Available Help at Discharge: Family;Available PRN/intermittently Type of Home: House Home Access: Stairs to enter Entrance Stairs-Rails: None Entrance Stairs-Number of Steps: 1 Home Layout: One level Home Equipment: Shower seat (white cane) Additional Comments: H/o legally blind majority of his wife    Prior Function           Comments: Children provide transportation and assist with meals. Retired from working for [employer] in [employer]        Extremity/Trunk Assessment   Upper Extremity Assessment Upper Extremity Assessment: Overall [facility] for tasks assessed    Lower Extremity Assessment Lower Extremity Assessment: Overall  WFL for tasks assessed       Communication   Communication: No difficulties  Cognition Arousal/Alertness: Awake/alert   Overall Cognitive Status: Within Functional Limits for tasks assessed                                        General Comments General comments (skin integrity, edema,  etc.): Preparing for d/c home this afternoon - son to bring walking stick and shoes/socks    Exercises     Assessment/Plan    PT Assessment Patent does not need any further PT services  PT Problem List         PT Treatment Interventions      PT Goals (Current goals can be found in the Care Plan section)  Acute Rehab PT Goals PT Goal Formulation: All assessment and education complete, DC therapy    Frequency Min 3X/week   Barriers to discharge        Co-evaluation               AM-PAC PT "6 Clicks" Mobility  Outcome Measure Help needed turning from your back to your side while in a flat bed without using bedrails?: None Help needed moving from lying on your back to sitting on the side of a flat bed without using bedrails?: None Help needed moving to and from a bed to a chair (including a wheelchair)?: None Help needed standing up from a chair using your arms (e.g., wheelchair or bedside chair)?: None Help needed to walk in hospital room?: A Little Help needed climbing 3-5 steps with a railing? : A Little 6 Click Score: 22    End of Session   Activity Tolerance: Patient tolerated treatment well Patient left: in chair;with call bell/phone within reach Nurse Communication: Mobility status PT Visit Diagnosis: Other abnormalities of gait and mobility (R26.89)    Time: 1093-2355 PT Time Calculation (min) (ACUTE ONLY): 26 min   Charges:   PT Evaluation $PT Eval Moderate Complexity: 1 Mod PT Treatments $Therapeutic Activity: 8-22 mins   Ina Homes, PT, DPT Acute Rehabilitation Services  Pager [phone] Office [phone]  Malachy Chamber 10/02/2020, 1:38 PM

## 2020-10-02 NOTE — Discharge Summary (Addendum)
Physician Discharge Summary  Eddie Graves DPO:[id number] DOB: October 30, 1931 DOA: 09/30/2020  PCP: Lucianne Lei, MD  Admit date: 09/30/2020 Discharge date: 10/02/2020  Admitted From: Home Discharge disposition: home   Code Status: Full Code  Diet Recommendation: Cardiac/diabetic  Discharge Diagnosis:   Active Problems:   Lobar pneumonia ([place])   History of Present Illness / Brief narrative:  Eddie Graves is a 86 y.o. male was brought to the ED from home on 10/25 for chest pain, increasing shortness of breath for last few days not responding to inhalers.   Respiratory history significant for chronic daily smoking, COPD, T2DM, HTN, HLD, CAD s/p stents, CKD3b, legal blindness. Symptoms started 3 weeks ago, progressively worsening, particularly hard in last 2 to 3 days.  Associated with wheezing, intermittent chest pain for which he was taking sublingual nitroglycerin at home.  No fever. He says he quit smoking a week ago.  In the ED, patient was afebrile, heart rate elevated to 104, tachypneic to 20s with blood pressure in 150s, Labs show WBC count at 19, creatinine 1.69, glucose 282. Covid PCR negative. Chest x-ray showed left lower lobe pneumonia. Initial treatment given started the ED with antibiotic, breathing treatment.  Patient continued to wheeze. He was admitted to hospital service for further evaluation management.  Subjective:  Seen and examined this morning.  Pleasant elderly African-American male. Lying down in bed.  Not in distress.  Feels better than at presentation.  Hospital Course:  Acute respiratory failure with hypoxia Acute exacerbation of COPD Left lower lobe pneumonia -Presented with worsening shortness of breath for last 2 to 3 weeks. -Chest x-ray finding with left lower lobe pneumonia. -Home meds include Xopenex, Dulera twice daily, Spiriva daily, -Patient was treated with IV Rocephin, IV azithromycin, Solu-Medrol IV twice daily,  bronchodilators. -Discharge home on oral Omnicef for next 7 days for probiotics, tapering course of steroids and continuation of bronchodilators.   -Currently on 2 L nasal cannula.  Continue the same.  Sepsis - POA -Met criteria for sepsis on admission with tachycardia, tachypnea, pneumonia, leukocytosis, AKI -Sepsis parameters improved with antibiotics, IV fluids. -Blood culture did not show any growth. -WBC count is up but likely related to steroids. Recent Labs  Lab 09/30/20 1418 10/01/20 1017 10/02/20 0229  WBC 19.1* 18.2* 21.6*  LATICACIDVEN  --  1.4  --    Chest pain/elevated troponin History of CAD s/p stents, hyperlipidemia -Mentioned chest pain on admission.  Troponin elevated to 97, repeat this morning. -Suspect demand ischemia because of sepsis. -Continue home meds which include nitroglycerin patch, aspirin 81 mg half tab daily, Plavix 75 mg daily, Crestor 20 mg daily, -Continue monitor. -Follows up with Dr. Terrence Dupont as an outpatient. Recent Labs    09/30/20 1418 09/30/20 1730 10/01/20 1017  TROPONINIHS 97* 92* 44*   Type 2 diabetes mellitus -A1c 6.2 on 10/25.   -Home meds include Humalog 75/25-30 units twice daily. -Currently patient is on the same regimen but blood sugar is up likely because of steroids.   -I would recommend increasing the dose of insulin to 35 units twice daily and gradually titrate down with prednisone taper.  Recent Labs  Lab 10/01/20 1236 10/01/20 1650 10/01/20 2331 10/02/20 0757 10/02/20 1150  GLUCAP 254* 193* 178* 225* 195*   Essential hypertension -Home meds include Toprol 100 mg daily, amlodipine 5 mg daily, lisinopril 20 mg daily,  -Continue all.  Chronic kidney disease stage IIIb -Baseline creatinine 1.3-1.5, as high as 2 earlier this year, currently stable  close to 1.7  -Monitor, avoid nephrotoxic agents Recent Labs    07/22/20 0015 09/30/20 1418 10/01/20 1017 10/02/20 0229  BUN 21 31* 39* 46*  CREATININE 2.05*  1.69* 1.71* 1.67*   Depression -Continue home medication  Tobacco use, in remission -Quit a week ago, encouraged ongoing cessation  Other home meds include Nexium 40 mg daily, Zoloft 50 mg daily,  Stable for discharge to home today.  Wound care:    Discharge Exam:   Vitals:   10/02/20 0519 10/02/20 0757 10/02/20 0758 10/02/20 1010  BP: (!) 134/91     Pulse: 86     Resp: 18     Temp: 97.8 F (36.6 C)     TempSrc: Oral     SpO2: 97% 97% 97% 95%  Weight:      Height:        Body mass index is 30.1 kg/m.  General exam: Appears calm and comfortable.  Not in physical distress Skin: No rashes, lesions or ulcers. HEENT: Atraumatic, normocephalic, supple neck, no obvious bleeding Lungs: Clear to auscultation bilaterally CVS: Regular rate and rhythm, no murmur GI/Abd soft, nontender, nondistended, bowel sound present CNS: Alert, awake, oriented x3 Psychiatry: Mood appropriate Extremities: No pedal edema, no calf tenderness  Follow ups:   Discharge Instructions    Increase activity slowly   Complete by: As directed       Follow-up Information    Renaye Rakers, MD Follow up.   Specialty: Family Medicine Contact information: [address] [place] [address] [phone]              I informed the patient and family that he should be on higher dose of insulin for next 7 days while on steroids.  Recommendations for Outpatient Follow-Up:   1. Follow-up with PCP as an outpatient  Discharge Instructions:  Follow with Primary MD Renaye Rakers, MD in 7 days   Get CBC/BMP checked in next visit within 1 week by PCP or SNF MD ( we routinely change or add medications that can affect your baseline labs and fluid status, therefore we recommend that you get the mentioned basic workup next visit with your PCP, your PCP may decide not to get them or add new tests based on their clinical decision)  On your next visit with your PCP, please Get Medicines reviewed  and adjusted.  Please request your PCP  to go over all Hospital Tests and Procedure/Radiological results at the follow up, please get all Hospital records sent to your Prim MD by signing hospital release before you go home.  Activity: As tolerated with Full fall precautions use walker/cane & assistance as needed  For Heart failure patients - Check your Weight same time everyday, if you gain over 2 pounds, or you develop in leg swelling, experience more shortness of breath or chest pain, call your Primary MD immediately. Follow Cardiac Low Salt Diet and 1.5 lit/day fluid restriction.  If you have smoked or chewed Tobacco in the last 2 yrs please stop smoking, stop any regular Alcohol  and or any Recreational drug use.  If you experience worsening of your admission symptoms, develop shortness of breath, life threatening emergency, suicidal or homicidal thoughts you must seek medical attention immediately by calling 911 or calling your MD immediately  if symptoms less severe.  You Must read complete instructions/literature along with all the possible adverse reactions/side effects for all the Medicines you take and that have been prescribed to you. Take  any new Medicines after you have completely understood and accpet all the possible adverse reactions/side effects.   Do not drive, operate heavy machinery, perform activities at heights, swimming or participation in water activities or provide baby sitting services if your were admitted for syncope or siezures until you have seen by Primary MD or a Neurologist and advised to do so again.  Do not drive when taking Pain medications.  Do not take more than prescribed Pain, Sleep and Anxiety Medications  Wear Seat belts while driving.   Please note You were cared for by a hospitalist during your hospital stay. If you have any questions about your discharge medications or the care you received while you were in the hospital after you are discharged, you  can call the unit and asked to speak with the hospitalist on call if the hospitalist that took care of you is not available. Once you are discharged, your primary care physician will handle any further medical issues. Please note that NO REFILLS for any discharge medications will be authorized once you are discharged, as it is imperative that you return to your primary care physician (or establish a relationship with a primary care physician if you do not have one) for your aftercare needs so that they can reassess your need for medications and monitor your lab values.    Allergies as of 10/02/2020      Reactions   Flexeril [cyclobenzaprine] Other (See Comments)   delirium   Nsaids Other (See Comments)   Renal failure      Medication List    TAKE these medications   amLODipine 10 MG tablet Commonly known as: NORVASC Take 5 mg by mouth daily.   aspirin EC 81 MG tablet 1/2 tablet by mouth daily per Cardiologist   azithromycin 500 MG tablet Commonly known as: Zithromax Take 1 tablet (500 mg total) by mouth daily for 4 days. Take until 10/05/20   cefdinir 300 MG capsule Commonly known as: OMNICEF Take 2 capsules (600 mg total) by mouth daily. Take until 10/05/20   cefdinir 300 MG capsule Commonly known as: OMNICEF Take 1 capsule (300 mg total) by mouth 2 (two) times daily for 7 days.   clopidogrel 75 MG tablet Commonly known as: PLAVIX Take 1 tablet (75 mg total) by mouth daily with breakfast.   Crestor 20 MG tablet Generic drug: rosuvastatin Take 20 mg by mouth at bedtime. For cholesterol   diclofenac Sodium 1 % Gel Commonly known as: VOLTAREN Apply 1 application topically 4 (four) times daily.   doxycycline 100 MG capsule Commonly known as: VIBRAMYCIN Take 1 capsule (100 mg total) by mouth 2 (two) times daily.   esomeprazole 40 MG capsule Commonly known as: NEXIUM TAKE ONE CAPSULE BY MOUTH ONCE DAILY BEFORE BREAKFAST FOR STOMACH What changed: See the new  instructions.   glucose blood test strip Commonly known as: Prodigy No Coding Blood Gluc PRODIGY TEST STRIPS, CHECK BLOOD SUGAR TWICE DAILY DX: E11.65, H54.7 PATIENT WITH BLINDNESS AND NEEDS STRIPS WITH READ ALOUD MONITOR   Insulin Lispro Prot & Lispro (75-25) 100 UNIT/ML Kwikpen Commonly known as: [name] 75/25 KwikPen Inject 35 Units into the skin 2 (two) times daily with a meal. What changed: how much to take   levalbuterol 1.25 MG/3ML nebulizer solution Commonly known as: XOPENEX TAKE 1.25 MG (ONE AMPULE) BY NEBULIZATION EVERY 4 (FOUR) HOURS AS NEEDED FOR WHEEZING. What changed: See the new instructions.   lisinopril 20 MG tablet Commonly known as: ZESTRIL TAKE  1 TABLET (20 MG TOTAL) BY MOUTH DAILY What changed: See the new instructions.   metoprolol succinate 100 MG 24 hr tablet Commonly known as: TOPROL-XL Take one tablet by mouth once daily for blood pressure   mometasone-formoterol 200-5 MCG/ACT Aero Commonly known as: DULERA Inhale 2 puffs into the lungs 2 (two) times daily.   nitroGLYCERIN 0.4 MG SL tablet Commonly known as: NITROSTAT Place 1 tablet (0.4 mg total) under the tongue every 5 (five) minutes as needed for chest pain.   nitroGLYCERIN 0.4 mg/hr patch Commonly known as: NITRODUR - Dosed in mg/24 hr Apply 1 patch to the skin daily   Omega 3 1200 MG Caps Take 2,400 mg by mouth daily. For cholesterol   predniSONE 10 MG tablet Commonly known as: DELTASONE Take 4 tablets daily X 2 days, then, Take 3 tablets daily X 2 days, then, Take 2 tablets daily X 2 days, then, Take 1 tablets daily X 1 day. What changed:   medication strength  how much to take  how to take this  when to take this  additional instructions   ProAir HFA 108 (90 Base) MCG/ACT inhaler Generic drug: albuterol INHALE TWO PUFFS INTO THE LUNGS EVERY TWO HOURS IF NEEDED   sertraline 50 MG tablet Commonly known as: ZOLOFT TAKE ONE TABLET BY MOUTH ONCE DAILY   tiotropium 18 MCG  inhalation capsule Commonly known as: SPIRIVA Place 1 capsule (18 mcg total) into inhaler and inhale daily.   Tradjenta 5 MG Tabs tablet Generic drug: linagliptin TAKE ONE TABLET BY MOUTH ONCE DAILY TO CONTROL BLOOD SUGAR   Vitamin D3 50 MCG (2000 UT) capsule TAKE 1 CAPSULE (2,000 UNITS TOTAL) BY MOUTH DAILY. FOR VITAMIN DAY REPLETION What changed: See the new instructions.       Time coordinating discharge: 35 minutes  The results of significant diagnostics from this hospitalization (including imaging, microbiology, ancillary and laboratory) are listed below for reference.    Procedures and Diagnostic Studies:   DG Chest 2 View  Result Date: 09/30/2020 CLINICAL DATA:  Chest pain, shortness of breath. EXAM: CHEST - 2 VIEW COMPARISON:  January 31, 2019. FINDINGS: Stable cardiomediastinal silhouette. No pneumothorax is noted. Right lung is clear. Interval development of airspace opacity involving superior segment of left lower lobe consistent with pneumonia. Bony thorax is unremarkable. IMPRESSION: Interval development of left lower lobe pneumonia. Followup PA and lateral chest X-ray is recommended in 3-4 weeks following trial of antibiotic therapy to ensure resolution and exclude underlying malignancy. Electronically Signed   By: Marijo Conception M.D.   On: 09/30/2020 15:10     Labs:   Basic Metabolic Panel: Recent Labs  Lab 09/30/20 1418 09/30/20 1418 10/01/20 1017 10/02/20 0229  NA 134*  --  134* 136  K 3.9   < > 3.8 3.6  CL 104  --  104 103  CO2 21*  --  19* 20*  GLUCOSE 282*  --  297* 157*  BUN 31*  --  39* 46*  CREATININE 1.69*  --  1.71* 1.67*  CALCIUM 8.6*  --  8.8* 8.9  MG  --   --  2.7*  --   PHOS  --   --  3.2  --    < > = values in this interval not displayed.   GFR Estimated Creatinine Clearance: 31.8 mL/min (A) (by C-G formula based on SCr of 1.67 mg/dL (H)). Liver Function Tests: No results for input(s): AST, ALT, ALKPHOS, BILITOT, PROT, ALBUMIN in the  last 168 hours. No results for input(s): LIPASE, AMYLASE in the last 168 hours. No results for input(s): AMMONIA in the last 168 hours. Coagulation profile No results for input(s): INR, PROTIME in the last 168 hours.  CBC: Recent Labs  Lab 09/30/20 1418 10/01/20 1017 10/02/20 0229  WBC 19.1* 18.2* 21.6*  NEUTROABS  --  16.9* 20.2*  HGB 12.2* 11.7* 11.2*  HCT 38.1* 35.4* 33.8*  MCV 91.1 89.4 89.4  PLT 267 315 347   Cardiac Enzymes: No results for input(s): CKTOTAL, CKMB, CKMBINDEX, TROPONINI in the last 168 hours. BNP: Invalid input(s): POCBNP CBG: Recent Labs  Lab 10/01/20 1236 10/01/20 1650 10/01/20 2331 10/02/20 0757 10/02/20 1150  GLUCAP 254* 193* 178* 225* 195*   D-Dimer No results for input(s): DDIMER in the last 72 hours. Hgb A1c Recent Labs    09/30/20 1730  HGBA1C 6.2*   Lipid Profile No results for input(s): CHOL, HDL, LDLCALC, TRIG, CHOLHDL, LDLDIRECT in the last 72 hours. Thyroid function studies No results for input(s): TSH, T4TOTAL, T3FREE, THYROIDAB in the last 72 hours.  Invalid input(s): FREET3 Anemia work up No results for input(s): VITAMINB12, FOLATE, FERRITIN, TIBC, IRON, RETICCTPCT in the last 72 hours. Microbiology Recent Results (from the past 240 hour(s))  Respiratory Panel by RT PCR (Flu A&B, Covid) - Nasopharyngeal Swab     Status: None   Collection Time: 09/30/20  2:16 PM   Specimen: Nasopharyngeal Swab  Result Value Ref Range Status   SARS Coronavirus 2 by RT PCR NEGATIVE NEGATIVE Final    Comment: (NOTE) SARS-CoV-2 target nucleic acids are NOT DETECTED.  The SARS-CoV-2 RNA is generally detectable in upper respiratoy specimens during the acute phase of infection. The lowest concentration of SARS-CoV-2 viral copies this assay can detect is 131 copies/mL. A negative result does not preclude SARS-Cov-2 infection and should not be used as the sole basis for treatment or other patient management decisions. A negative result may  occur with  improper specimen collection/handling, submission of specimen other than nasopharyngeal swab, presence of viral mutation(s) within the areas targeted by this assay, and inadequate number of viral copies (<131 copies/mL). A negative result must be combined with clinical observations, patient history, and epidemiological information. The expected result is Negative.  Fact Sheet for Patients:  PinkCheek.be  Fact Sheet for Healthcare Providers:  GravelBags.it  This test is no t yet approved or cleared by the [place] FDA and  has been authorized for detection and/or diagnosis of SARS-CoV-2 by FDA under an Emergency Use Authorization (EUA). This EUA will remain  in effect (meaning this test can be used) for the duration of the COVID-19 declaration under Section 564(b)(1) of the Act, 21 U.S.C. section 360bbb-3(b)(1), unless the authorization is terminated or revoked sooner.     Influenza A by PCR NEGATIVE NEGATIVE Final   Influenza B by PCR NEGATIVE NEGATIVE Final    Comment: (NOTE) The Xpert Xpress SARS-CoV-2/FLU/RSV assay is intended as an aid in  the diagnosis of influenza from Nasopharyngeal swab specimens and  should not be used as a sole basis for treatment. Nasal washings and  aspirates are unacceptable for Xpert Xpress SARS-CoV-2/FLU/RSV  testing.  Fact Sheet for Patients: PinkCheek.be  Fact Sheet for Healthcare Providers: GravelBags.it  This test is not yet approved or cleared by the [place] FDA and  has been authorized for detection and/or diagnosis of SARS-CoV-2 by  FDA under an Emergency Use Authorization (EUA). This EUA will remain  in effect (meaning this test can  be used) for the duration of the  Covid-19 declaration under Section 564(b)(1) of the Act, 21  U.S.C. section 360bbb-3(b)(1), unless the authorization is  terminated or  revoked. Performed at [facility], [place] [address], [place], [place] [address]   Culture, blood (Routine X 2) w Reflex to ID Panel     Status: None (Preliminary result)   Collection Time: 09/30/20  5:30 PM   Specimen: BLOOD RIGHT ARM  Result Value Ref Range Status   Specimen Description BLOOD RIGHT ARM  Final   Special Requests   Final    BOTTLES DRAWN AEROBIC AND ANAEROBIC Blood Culture adequate volume   Culture   Final    NO GROWTH 2 DAYS Performed at [facility], [place] [address] [place], [place] [address]    Report Status PENDING  Incomplete  Culture, blood (Routine X 2) w Reflex to ID Panel     Status: None (Preliminary result)   Collection Time: 09/30/20  5:50 PM   Specimen: BLOOD RIGHT HAND  Result Value Ref Range Status   Specimen Description BLOOD RIGHT HAND  Final   Special Requests   Final    BOTTLES DRAWN AEROBIC AND ANAEROBIC Blood Culture adequate volume   Culture   Final    NO GROWTH 2 DAYS Performed at [facility], [place] [address] [place], [place] [address]    Report Status PENDING  Incomplete     Signed: Marlowe Aschoff Glynna Failla  [facility] 10/02/2020, 1:42 PM

## 2020-10-02 NOTE — Discharge Instructions (Signed)

## 2020-10-02 NOTE — Consult Note (Signed)
   [facility] CM Inpatient Consult   10/02/2020  Eddie Graves [birth year] [id number]   [facility] [[facility]]  Accountable Care Organization [ACO] Patient: [name]  Patient evaluated for community based chronic complex disease management services with [facility] Care Management Program as a benefit of patient's [facility] [employer].  Chart reviewed for admission and follow up.   Patient will receive post hospital discharge call and will be evaluated for complex disease management.    Plan: Assigned to COPD EMMI follow up and offer for additional support.   Of note, [facility] Care Management services does not replace or interfere with any services that are arranged by inpatient case management or social work.  For additional questions or referrals please contact:    Charlesetta Shanks, RN BSN CCM [facility]  [phone] business mobile phone Toll free office [phone]  Fax number: [phone] [email or web address]

## 2020-10-03 DIAGNOSIS — E1169 Type 2 diabetes mellitus with other specified complication: Secondary | ICD-10-CM | POA: Diagnosis not present

## 2020-10-03 DIAGNOSIS — E785 Hyperlipidemia, unspecified: Secondary | ICD-10-CM | POA: Diagnosis not present

## 2020-10-05 LAB — CULTURE, BLOOD (ROUTINE X 2)
Culture: NO GROWTH
Culture: NO GROWTH
Special Requests: ADEQUATE
Special Requests: ADEQUATE

## 2020-10-14 ENCOUNTER — Other Ambulatory Visit: Payer: Self-pay | Admitting: *Deleted

## 2020-10-14 NOTE — Patient Outreach (Signed)
[facility]) Care Management  10/14/2020  Eddie Graves 14-Apr-1931 [id number]   RED ON EMMI ALERT - COPD Day # 8 Date: 11/5 Red Alert Reason: Having more and thicker mucus, Mucus color changes, Using rescue inhaler 5 times a day   Outreach attempt #1, successful to son Maisie Fus.  Identity verified.  This care manager introduced self and stated purpose of call.  [facility] care management services explained.    Son report member is doing well, state he is at "95%" of his normal self.  He lives with member and is helping him when needed.  State member has seen PCP since his discharge, and is taking all medications per discharge instructions.  Denies that member is having any issues with mucus production or color change, stating member is blind and unable to see mucus color.  In regards to use of inhaler, denies member has needed touse his rescue inhaler.  He has 2 maintenance inhalers he uses twice a day, this may be what member was referring to.  He is unsure how the alerts were triggered, but denies any issues.  Denies any further needs from [facility] at this time but does agree to have program information mailed to the home.    Plan: RN CM will send successful outreach letter but will not open case at this time.  Kemper Durie, [place], MSN [facility] Care Management  [facility] Manager [phone]

## 2020-10-24 DIAGNOSIS — E162 Hypoglycemia, unspecified: Secondary | ICD-10-CM | POA: Diagnosis not present

## 2020-10-24 DIAGNOSIS — E161 Other hypoglycemia: Secondary | ICD-10-CM | POA: Diagnosis not present

## 2020-10-24 DIAGNOSIS — R404 Transient alteration of awareness: Secondary | ICD-10-CM | POA: Diagnosis not present

## 2020-11-05 DIAGNOSIS — I129 Hypertensive chronic kidney disease with stage 1 through stage 4 chronic kidney disease, or unspecified chronic kidney disease: Secondary | ICD-10-CM | POA: Diagnosis not present

## 2020-11-05 DIAGNOSIS — E1122 Type 2 diabetes mellitus with diabetic chronic kidney disease: Secondary | ICD-10-CM | POA: Diagnosis not present

## 2020-11-05 DIAGNOSIS — J449 Chronic obstructive pulmonary disease, unspecified: Secondary | ICD-10-CM | POA: Diagnosis not present

## 2020-11-05 DIAGNOSIS — E1169 Type 2 diabetes mellitus with other specified complication: Secondary | ICD-10-CM | POA: Diagnosis not present

## 2020-11-05 DIAGNOSIS — N1832 Chronic kidney disease, stage 3b: Secondary | ICD-10-CM | POA: Diagnosis not present

## 2020-11-05 DIAGNOSIS — M130 Polyarthritis, unspecified: Secondary | ICD-10-CM | POA: Diagnosis not present

## 2020-11-13 ENCOUNTER — Emergency Department (HOSPITAL_COMMUNITY): Payer: Medicare Other

## 2020-11-13 ENCOUNTER — Emergency Department (HOSPITAL_COMMUNITY)
Admission: EM | Admit: 2020-11-13 | Discharge: 2020-11-14 | Disposition: A | Discharge status: Home / Self Care | Payer: Medicare Other | Attending: Emergency Medicine | Admitting: Emergency Medicine

## 2020-11-13 DIAGNOSIS — Z7982 Long term (current) use of aspirin: Secondary | ICD-10-CM | POA: Diagnosis not present

## 2020-11-13 DIAGNOSIS — I130 Hypertensive heart and chronic kidney disease with heart failure and stage 1 through stage 4 chronic kidney disease, or unspecified chronic kidney disease: Secondary | ICD-10-CM | POA: Diagnosis not present

## 2020-11-13 DIAGNOSIS — I2510 Atherosclerotic heart disease of native coronary artery without angina pectoris: Secondary | ICD-10-CM | POA: Diagnosis not present

## 2020-11-13 DIAGNOSIS — J441 Chronic obstructive pulmonary disease with (acute) exacerbation: Secondary | ICD-10-CM | POA: Diagnosis not present

## 2020-11-13 DIAGNOSIS — Z794 Long term (current) use of insulin: Secondary | ICD-10-CM | POA: Insufficient documentation

## 2020-11-13 DIAGNOSIS — Z7952 Long term (current) use of systemic steroids: Secondary | ICD-10-CM | POA: Insufficient documentation

## 2020-11-13 DIAGNOSIS — I959 Hypotension, unspecified: Secondary | ICD-10-CM | POA: Diagnosis not present

## 2020-11-13 DIAGNOSIS — I5030 Unspecified diastolic (congestive) heart failure: Secondary | ICD-10-CM | POA: Insufficient documentation

## 2020-11-13 DIAGNOSIS — Z7984 Long term (current) use of oral hypoglycemic drugs: Secondary | ICD-10-CM | POA: Diagnosis not present

## 2020-11-13 DIAGNOSIS — R55 Syncope and collapse: Secondary | ICD-10-CM | POA: Insufficient documentation

## 2020-11-13 DIAGNOSIS — Z79899 Other long term (current) drug therapy: Secondary | ICD-10-CM | POA: Insufficient documentation

## 2020-11-13 DIAGNOSIS — Z7901 Long term (current) use of anticoagulants: Secondary | ICD-10-CM | POA: Diagnosis not present

## 2020-11-13 DIAGNOSIS — Z743 Need for continuous supervision: Secondary | ICD-10-CM | POA: Diagnosis not present

## 2020-11-13 DIAGNOSIS — N183 Chronic kidney disease, stage 3 unspecified: Secondary | ICD-10-CM | POA: Diagnosis not present

## 2020-11-13 DIAGNOSIS — E1122 Type 2 diabetes mellitus with diabetic chronic kidney disease: Secondary | ICD-10-CM | POA: Insufficient documentation

## 2020-11-13 DIAGNOSIS — J9811 Atelectasis: Secondary | ICD-10-CM | POA: Diagnosis not present

## 2020-11-13 DIAGNOSIS — E1142 Type 2 diabetes mellitus with diabetic polyneuropathy: Secondary | ICD-10-CM | POA: Diagnosis not present

## 2020-11-13 LAB — CBC WITH DIFFERENTIAL/PLATELET
Abs Immature Granulocytes: 0.02 10*3/uL (ref 0.00–0.07)
Basophils Absolute: 0 10*3/uL (ref 0.0–0.1)
Basophils Relative: 1 %
Eosinophils Absolute: 0.2 10*3/uL (ref 0.0–0.5)
Eosinophils Relative: 4 %
HCT: 40.2 % (ref 39.0–52.0)
Hemoglobin: 12.7 g/dL — ABNORMAL LOW (ref 13.0–17.0)
Immature Granulocytes: 0 %
Lymphocytes Relative: 23 %
Lymphs Abs: 1.3 10*3/uL (ref 0.7–4.0)
MCH: 29.5 pg (ref 26.0–34.0)
MCHC: 31.6 g/dL (ref 30.0–36.0)
MCV: 93.5 fL (ref 80.0–100.0)
Monocytes Absolute: 0.8 10*3/uL (ref 0.1–1.0)
Monocytes Relative: 14 %
Neutro Abs: 3.2 10*3/uL (ref 1.7–7.7)
Neutrophils Relative %: 58 %
Platelets: 218 10*3/uL (ref 150–400)
RBC: 4.3 MIL/uL (ref 4.22–5.81)
RDW: 14.3 % (ref 11.5–15.5)
WBC: 5.6 10*3/uL (ref 4.0–10.5)
nRBC: 0 % (ref 0.0–0.2)

## 2020-11-13 LAB — COMPREHENSIVE METABOLIC PANEL
ALT: 13 U/L (ref 0–44)
AST: 30 U/L (ref 15–41)
Albumin: 3.2 g/dL — ABNORMAL LOW (ref 3.5–5.0)
Alkaline Phosphatase: 103 U/L (ref 38–126)
Anion gap: 7 (ref 5–15)
BUN: 13 mg/dL (ref 8–23)
CO2: 22 mmol/L (ref 22–32)
Calcium: 9.1 mg/dL (ref 8.9–10.3)
Chloride: 104 mmol/L (ref 98–111)
Creatinine, Ser: 1.39 mg/dL — ABNORMAL HIGH (ref 0.61–1.24)
GFR, Estimated: 49 mL/min — ABNORMAL LOW (ref >60)
Glucose, Bld: 124 mg/dL — ABNORMAL HIGH (ref 70–99)
Potassium: 4.6 mmol/L (ref 3.5–5.1)
Sodium: 133 mmol/L — ABNORMAL LOW (ref 135–145)
Total Bilirubin: 0.5 mg/dL (ref 0.3–1.2)
Total Protein: 6.5 g/dL (ref 6.5–8.1)

## 2020-11-13 LAB — TROPONIN I (HIGH SENSITIVITY)
Troponin I (High Sensitivity): 9 ng/L (ref <18)
Troponin I (High Sensitivity): 9 ng/L (ref <18)
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
## 2020-11-13 NOTE — Discharge Instructions (Addendum)
Your heart enzyme tests are normal today.   See your heart doctor for follow up   Return to ER if you have dizziness, passing out, chest pain, trouble breathing

## 2020-11-13 NOTE — ED Provider Notes (Signed)
[facility] EMERGENCY DEPARTMENT Provider Note   CSN: [id number] Arrival date & time: 11/13/20  1946     History Chief Complaint  Patient presents with  . Loss of Consciousness    Eddie Graves is a 86 y.o. male hx of DM, depression, here with near syncope.  Patient states that he was at dinner table and had a verbal altercation with his son.  He states that they had an argument and he felt lightheaded and dizzy.  He insisted that he did not actually pass out.  He just felt very dizzy.  EMS was called and there was no seizure activity noticed.  He is back to baseline now.  Denies any chest pain or shortness of breath.  He states that he does have stents in his heart.  The history is provided by the patient.       Past Medical History:  Diagnosis Date  . Anxiety state, unspecified   . Blind   . Chronic airway obstruction, not elsewhere classified   . Coronary atherosclerosis of native coronary artery   . Cough   . Depressive disorder, not elsewhere classified   . DM (diabetes mellitus) type II controlled peripheral vascular disorder   . Essential hypertension, benign   . H/O hiatal hernia   . Headache(784.0)   . Legal blindness, as defined in [place]   . Memory loss   . Other and unspecified hyperlipidemia   . Pain in joint, site unspecified   . Palpitations   . Rash and other nonspecific skin eruption   . Reflux esophagitis   . Sebaceous cyst   . Spinal stenosis, lumbar region, with neurogenic claudication   . Type II diabetes mellitus with neurological manifestations, uncontrolled (HCC)   . Unspecified constipation   . Vitamin D deficiency     Patient Active Problem List   Diagnosis Date Noted  . Lobar pneumonia (HCC) 09/30/2020  . Acute non Q wave myocardial infarction (HCC) 12/12/2016  . Uncontrolled type 2 diabetes mellitus with diabetic polyneuropathy, with long-term current use of insulin (HCC) 03/05/2016  . Tobacco abuse counseling 03/05/2016   . Right sided abdominal pain 03/05/2016  . Constipation 03/05/2016  . Chest pain at rest 11/06/2015  . Diastolic heart failure (HCC) 11/06/2015  . Acute bronchitis with chronic obstructive pulmonary disease (COPD) (HCC) 11/06/2015  . Neurological deficit, transient 11/06/2015  . Left facial numbness   . AKI (acute kidney injury) (HCC)   . Chest pain 05/19/2015  . History of gonorrhea 05/19/2015  . Hypotension 05/19/2015  . CKD (chronic kidney disease), stage III (HCC) 05/19/2015  . Involuntary muscle contractions 10/12/2014  . Seizure disorder after [facility] 08/19/2014  . Diabetic peripheral neuropathy associated with type 2 diabetes mellitus (HCC) 07/26/2014  . Blind 07/26/2014  . Tobacco abuse 04/26/2014  . Obesity (BMI 30-39.9) 08/18/2013  . COPD exacerbation (HCC) 08/06/2013  . SAH (subarachnoid hemorrhage) (HCC) 05/30/2013  . Coronary atherosclerosis of native coronary artery   . Vitamin D deficiency   . Memory loss   . COPD (chronic obstructive pulmonary disease) (HCC) 03/16/2013  . Essential hypertension, benign 03/16/2013  . Depressive disorder, not elsewhere classified 03/16/2013  . Spinal stenosis, lumbar region, with neurogenic claudication 03/16/2013    Past Surgical History:  Procedure Laterality Date  . CARDIAC CATHETERIZATION N/A 12/15/2016   Procedure: Left Heart Cath and Coronary Angiography;  Surgeon: Rinaldo Cloud, MD;  Location: [facility] INVASIVE CV LAB;  Service: Cardiovascular;  Laterality: N/A;  . CORONARY ANGIOPLASTY  WITH STENT PLACEMENT    . EYE SURGERY     right prosthetic globe  . HERNIA REPAIR         Family History  Problem Relation Age of Onset  . Asthma Mother   . Diabetes Son   . Cancer Daughter   . Diabetes Son     Social History   Tobacco Use  . Smoking status: Light Tobacco Smoker    Years: 50.00    Types: Cigarettes    Last attempt to quit: 03/07/2013    Years since quitting: 7.6  . Smokeless tobacco: Never Used  . Tobacco comment:  Patient say he smokes 4 cigs. per day  Substance Use Topics  . Alcohol use: No    Alcohol/week: 0.0 standard drinks  . Drug use: No    Home Medications Prior to Admission medications   Medication Sig Start Date End Date Taking? Authorizing Provider  amLODipine (NORVASC) 10 MG tablet Take 5 mg by mouth daily.  01/09/19   Rinaldo Cloud, MD  aspirin EC 81 MG tablet 1/2 tablet by mouth daily per Cardiologist 11/05/16   Renato Gails, Tiffany L, DO  cefdinir (OMNICEF) 300 MG capsule Take 2 capsules (600 mg total) by mouth daily. Take until 10/05/20 10/02/20   Lorin Glass, MD  Cholecalciferol (VITAMIN D3) 2000 units capsule TAKE 1 CAPSULE (2,000 UNITS TOTAL) BY MOUTH DAILY. FOR VITAMIN DAY REPLETION Patient taking differently: Take 2,000 Units by mouth daily.  01/17/18   Reed, Tiffany L, DO  clopidogrel (PLAVIX) 75 MG tablet Take 1 tablet (75 mg total) by mouth daily with breakfast. 12/17/16   Rinaldo Cloud, MD  CRESTOR 20 MG tablet Take 20 mg by mouth at bedtime. For cholesterol 08/13/14   [provider]  diclofenac Sodium (VOLTAREN) 1 % GEL Apply 1 application topically 4 (four) times daily. 09/24/20   [provider]  doxycycline (VIBRAMYCIN) 100 MG capsule Take 1 capsule (100 mg total) by mouth 2 (two) times daily. Patient not taking: Reported on 09/30/2020 01/31/19   Gilda Crease, MD  esomeprazole (NEXIUM) 40 MG capsule TAKE ONE CAPSULE BY MOUTH ONCE DAILY BEFORE BREAKFAST FOR STOMACH Patient taking differently: Take 40 mg by mouth daily.  01/17/18   Reed, Tiffany L, DO  glucose blood (PRODIGY NO CODING BLOOD GLUC) test strip PRODIGY TEST STRIPS, CHECK BLOOD SUGAR TWICE DAILY DX: E11.65, H54.7 PATIENT WITH BLINDNESS AND NEEDS STRIPS WITH READ ALOUD MONITOR 03/08/18   Reed, Tiffany L, DO  Insulin Lispro Prot & Lispro ([name] 75/25 KWIKPEN) (75-25) 100 UNIT/ML Kwikpen Inject 35 Units into the skin 2 (two) times daily with a meal. 10/02/20   Dahal, Melina Schools, MD  levalbuterol  (XOPENEX) 1.25 MG/3ML nebulizer solution TAKE 1.25 MG (ONE AMPULE) BY NEBULIZATION EVERY 4 (FOUR) HOURS AS NEEDED FOR WHEEZING. Patient taking differently: Take 1.25 mg by nebulization every 4 (four) hours as needed for wheezing or shortness of breath.  01/17/18   Reed, Tiffany L, DO  lisinopril (PRINIVIL,ZESTRIL) 20 MG tablet TAKE 1 TABLET (20 MG TOTAL) BY MOUTH DAILY Patient taking differently: Take 20 mg by mouth daily.  01/17/18   Reed, Tiffany L, DO  metoprolol succinate (TOPROL-XL) 100 MG 24 hr tablet Take one tablet by mouth once daily for blood pressure 06/06/15   Reed, Tiffany L, DO  mometasone-formoterol (DULERA) 200-5 MCG/ACT AERO Inhale 2 puffs into the lungs 2 (two) times daily. 12/16/16   Rinaldo Cloud, MD  nitroGLYCERIN (NITRODUR - DOSED IN MG/24 HR) 0.4 mg/hr patch Apply  1 patch to the skin daily 01/30/19   [provider]  nitroGLYCERIN (NITROSTAT) 0.4 MG SL tablet Place 1 tablet (0.4 mg total) under the tongue every 5 (five) minutes as needed for chest pain. 12/16/16   Rinaldo Cloud, MD  Omega 3 1200 MG CAPS Take 2,400 mg by mouth daily. For cholesterol    [provider]  predniSONE (DELTASONE) 10 MG tablet Take 4 tablets daily X 2 days, then, Take 3 tablets daily X 2 days, then, Take 2 tablets daily X 2 days, then, Take 1 tablets daily X 1 day. 10/02/20   Lorin Glass, MD  PROAIR HFA 108 [id number] Base) MCG/ACT inhaler INHALE TWO PUFFS INTO THE LUNGS EVERY TWO HOURS IF NEEDED Patient not taking: Reported on 09/30/2020 01/17/18   Reed, Tiffany L, DO  sertraline (ZOLOFT) 50 MG tablet TAKE ONE TABLET BY MOUTH ONCE DAILY 01/17/18   Reed, Tiffany L, DO  tiotropium (SPIRIVA) 18 MCG inhalation capsule Place 1 capsule (18 mcg total) into inhaler and inhale daily. 12/17/16   Rinaldo Cloud, MD  TRADJENTA 5 MG TABS tablet TAKE ONE TABLET BY MOUTH ONCE DAILY TO CONTROL BLOOD SUGAR Patient not taking: Reported on 09/30/2020 01/17/18   Bufford Spikes L, DO    Allergies    Flexeril  [cyclobenzaprine] and Nsaids  Review of Systems   Review of Systems  Cardiovascular: Positive for syncope.  Neurological: Positive for syncope and light-headedness.  All other systems reviewed and are negative.   Physical Exam Updated Vital Signs BP 123/61   Pulse 64   Resp 18   SpO2 99%   Physical Exam Vitals and nursing note reviewed.  Constitutional:      Appearance: Normal appearance.     Comments: Chronically ill   HENT:     Head: Normocephalic.     Nose: Nose normal.     Mouth/Throat:     Mouth: Mucous membranes are moist.  Eyes:     Extraocular Movements: Extraocular movements intact.     Pupils: Pupils are equal, round, and reactive to light.  Cardiovascular:     Rate and Rhythm: Normal rate and regular rhythm.     Pulses: Normal pulses.     Heart sounds: Normal heart sounds.  Pulmonary:     Effort: Pulmonary effort is normal.     Breath sounds: Normal breath sounds.  Abdominal:     General: Abdomen is flat.     Palpations: Abdomen is soft.  Musculoskeletal:        General: Normal range of motion.     Cervical back: Normal range of motion and neck supple.  Skin:    General: Skin is warm.     Capillary Refill: Capillary refill takes less than 2 seconds.  Neurological:     General: No focal deficit present.     Mental Status: He is alert and oriented to person, place, and time.  Psychiatric:        Mood and Affect: Mood normal.        Behavior: Behavior normal.     ED Results / Procedures / Treatments   Labs (all labs ordered are listed, but only abnormal results are displayed) Labs Reviewed  CBC WITH DIFFERENTIAL/PLATELET - Abnormal; Notable for the following components:      Result Value   Hemoglobin 12.7 (*)    All other components within normal limits  COMPREHENSIVE METABOLIC PANEL - Abnormal; Notable for the following components:   Sodium 133 (*)    Glucose,  Bld 124 (*)    Creatinine, Ser 1.39 (*)    Albumin 3.2 (*)    GFR, Estimated 49  (*)    All other components within normal limits  TROPONIN I (HIGH SENSITIVITY)  TROPONIN I (HIGH SENSITIVITY)    EKG EKG Interpretation  Date/Time:  Wednesday November 13 2020 20:08:31 EST Ventricular Rate:  65 PR Interval:    QRS Duration: 99 QT Interval:  424 QTC Calculation: 441 R Axis:   -57 Text Interpretation: Sinus rhythm Prolonged PR interval LAD, consider left anterior fascicular block Nonspecific T abnormalities, inferior leads No significant change since last tracing Confirmed by Richardean Canal ([id number]) on 11/13/2020 8:18:56 PM   Radiology DG Chest Port 1 View  Result Date: 11/13/2020 CLINICAL DATA:  Syncope. EXAM: PORTABLE CHEST 1 VIEW COMPARISON:  September 30, 2020 FINDINGS: Mild, stable diffuse chronic appearing increased interstitial lung markings are seen. Mild areas of atelectasis and/or infiltrate are seen within the bilateral lung bases. There is no evidence of a pleural effusion or pneumothorax. The heart size and mediastinal contours are within normal limits. The visualized skeletal structures are unremarkable. IMPRESSION: Chronic appearing increased interstitial lung markings with mild bibasilar atelectasis and/or infiltrate. Electronically Signed   By: Aram Candela M.D.   On: 11/13/2020 20:40    Procedures Procedures (including critical care time)  Medications Ordered in ED Medications - No data to display  ED Course  I have reviewed the triage vital signs and the nursing notes.  Pertinent labs & imaging results that were available during my care of the patient were reviewed by me and considered in my medical decision making (see chart for details).    MDM Rules/Calculators/A&P                         Eddie Graves is a 86 y.o. male here with near syncope after an argument. Likely vasovagal syncope. Will get cbc, cmp, trop x 2, orthostatics. If troponins negative, likely can be discharged. Has no chest pain. He felt safe at home.   11:45 PM Trop neg  x 2. CXR showed mild atelectasis. Stable for discharge.    Final Clinical Impression(s) / ED Diagnoses Final diagnoses:  Near syncope    Rx / DC Orders ED Discharge Orders    None       Charlynne Pander, MD 11/13/20 [id number]

## 2021-01-31 ENCOUNTER — Other Ambulatory Visit: Payer: Self-pay | Admitting: Registered Nurse

## 2021-01-31 ENCOUNTER — Ambulatory Visit
Admission: RE | Admit: 2021-01-31 | Discharge: 2021-01-31 | Disposition: A | Discharge status: Home / Self Care | Payer: Medicare Other | Source: Ambulatory Visit | Attending: Registered Nurse | Admitting: Registered Nurse

## 2021-01-31 DIAGNOSIS — R0602 Shortness of breath: Secondary | ICD-10-CM

## 2021-09-17 ENCOUNTER — Other Ambulatory Visit (HOSPITAL_COMMUNITY): Payer: Self-pay

## 2021-09-17 ENCOUNTER — Telehealth (HOSPITAL_COMMUNITY): Payer: Self-pay

## 2021-09-17 DIAGNOSIS — R1310 Dysphagia, unspecified: Secondary | ICD-10-CM

## 2021-09-22 ENCOUNTER — Ambulatory Visit (HOSPITAL_COMMUNITY): Admission: RE | Admit: 2021-09-22 | Payer: Medicare Other | Source: Ambulatory Visit

## 2021-09-22 ENCOUNTER — Other Ambulatory Visit: Payer: Self-pay

## 2021-09-22 ENCOUNTER — Encounter (HOSPITAL_COMMUNITY): Payer: Self-pay

## 2021-09-22 ENCOUNTER — Ambulatory Visit (HOSPITAL_COMMUNITY): Payer: Medicare Other

## 2021-11-18 ENCOUNTER — Other Ambulatory Visit: Payer: Self-pay | Admitting: Family Medicine

## 2021-11-18 ENCOUNTER — Ambulatory Visit
Admission: RE | Admit: 2021-11-18 | Discharge: 2021-11-18 | Disposition: A | Discharge status: Home / Self Care | Payer: Medicare Other | Source: Ambulatory Visit | Attending: Family Medicine | Admitting: Family Medicine

## 2021-11-18 DIAGNOSIS — M79604 Pain in right leg: Secondary | ICD-10-CM

## 2022-01-01 ENCOUNTER — Other Ambulatory Visit: Payer: Self-pay

## 2022-01-01 ENCOUNTER — Emergency Department (HOSPITAL_COMMUNITY): Payer: Medicare Other

## 2022-01-01 ENCOUNTER — Emergency Department (HOSPITAL_COMMUNITY)
Admission: EM | Admit: 2022-01-01 | Discharge: 2022-01-01 | Disposition: A | Discharge status: Home / Self Care | Payer: Medicare Other | Attending: Emergency Medicine | Admitting: Emergency Medicine

## 2022-01-01 DIAGNOSIS — M79602 Pain in left arm: Secondary | ICD-10-CM | POA: Diagnosis present

## 2022-01-01 DIAGNOSIS — M79605 Pain in left leg: Secondary | ICD-10-CM | POA: Insufficient documentation

## 2022-01-01 DIAGNOSIS — R52 Pain, unspecified: Secondary | ICD-10-CM

## 2022-01-01 DIAGNOSIS — M79604 Pain in right leg: Secondary | ICD-10-CM | POA: Diagnosis not present

## 2022-01-01 DIAGNOSIS — Z7982 Long term (current) use of aspirin: Secondary | ICD-10-CM | POA: Diagnosis not present

## 2022-01-01 DIAGNOSIS — M25552 Pain in left hip: Secondary | ICD-10-CM | POA: Diagnosis not present

## 2022-01-01 DIAGNOSIS — M79601 Pain in right arm: Secondary | ICD-10-CM | POA: Insufficient documentation

## 2022-01-01 DIAGNOSIS — R519 Headache, unspecified: Secondary | ICD-10-CM | POA: Insufficient documentation

## 2022-01-01 DIAGNOSIS — M25551 Pain in right hip: Secondary | ICD-10-CM | POA: Insufficient documentation

## 2022-01-01 DIAGNOSIS — I609 Nontraumatic subarachnoid hemorrhage, unspecified: Secondary | ICD-10-CM | POA: Diagnosis not present

## 2022-01-01 DIAGNOSIS — Z20822 Contact with and (suspected) exposure to covid-19: Secondary | ICD-10-CM | POA: Insufficient documentation

## 2022-01-01 LAB — COMPREHENSIVE METABOLIC PANEL
ALT: 10 U/L (ref 0–44)
AST: 20 U/L (ref 15–41)
Albumin: 3.4 g/dL — ABNORMAL LOW (ref 3.5–5.0)
Alkaline Phosphatase: 99 U/L (ref 38–126)
Anion gap: 8 (ref 5–15)
BUN: 18 mg/dL (ref 8–23)
CO2: 23 mmol/L (ref 22–32)
Calcium: 9.1 mg/dL (ref 8.9–10.3)
Chloride: 106 mmol/L (ref 98–111)
Creatinine, Ser: 1.4 mg/dL — ABNORMAL HIGH (ref 0.61–1.24)
GFR, Estimated: 48 mL/min — ABNORMAL LOW (ref >60)
Glucose, Bld: 162 mg/dL — ABNORMAL HIGH (ref 70–99)
Potassium: 3.9 mmol/L (ref 3.5–5.1)
Sodium: 137 mmol/L (ref 135–145)
Total Bilirubin: 0.5 mg/dL (ref 0.3–1.2)
Total Protein: 6.7 g/dL (ref 6.5–8.1)

## 2022-01-01 LAB — CBC WITH DIFFERENTIAL/PLATELET
Abs Immature Granulocytes: 0.01 10*3/uL (ref 0.00–0.07)
Basophils Absolute: 0 10*3/uL (ref 0.0–0.1)
Basophils Relative: 1 %
Eosinophils Absolute: 0.4 10*3/uL (ref 0.0–0.5)
Eosinophils Relative: 8 %
HCT: 44.9 % (ref 39.0–52.0)
Hemoglobin: 13.8 g/dL (ref 13.0–17.0)
Immature Granulocytes: 0 %
Lymphocytes Relative: 29 %
Lymphs Abs: 1.6 10*3/uL (ref 0.7–4.0)
MCH: 28.8 pg (ref 26.0–34.0)
MCHC: 30.7 g/dL (ref 30.0–36.0)
MCV: 93.7 fL (ref 80.0–100.0)
Monocytes Absolute: 0.7 10*3/uL (ref 0.1–1.0)
Monocytes Relative: 13 %
Neutro Abs: 2.8 10*3/uL (ref 1.7–7.7)
Neutrophils Relative %: 49 %
Platelets: 200 10*3/uL (ref 150–400)
RBC: 4.79 MIL/uL (ref 4.22–5.81)
RDW: 13.3 % (ref 11.5–15.5)
WBC: 5.7 10*3/uL (ref 4.0–10.5)
nRBC: 0 % (ref 0.0–0.2)

## 2022-01-01 LAB — RESP PANEL BY RT-PCR (FLU A&B, COVID) ARPGX2
Influenza A by PCR: NEGATIVE
Influenza B by PCR: NEGATIVE
SARS Coronavirus 2 by RT PCR: NEGATIVE

## 2022-01-01 NOTE — ED Triage Notes (Signed)
Patient BIB [facility] from home after being called by family who reports slurred speech. Patient denies slurred speech but endorses left arm and leg tightness x3 days. EMS reports patient is legally blind, hx DMII, HTN, COPD. CBG 239. BP 157/83.

## 2022-01-01 NOTE — ED Notes (Signed)
Patient transported to X-ray 

## 2022-01-01 NOTE — Discharge Instructions (Signed)
There were no problems found.  You may have a small amount of subarachnoid bleeding on the right side of your head causing your discomfort.  It is not enough to require surgery or hospitalization at this time.  Use Tylenol every 4 hours if needed for pain.  If your symptoms worsen, return here for evaluation.  Watch out for weakness, dizziness, nausea, vomiting, confusion or other problems that are concerning.  Follow-up with your primary care doctor for checkup in 1 week.

## 2022-01-01 NOTE — ED Provider Notes (Signed)
[facility]   CSN: [id number] Arrival date & time: 01/01/22  1339     History  Chief Complaint  Patient presents with   Extremity Pain    Arm tightness    Eddie Graves is a 88 y.o. male.  HPI Presents for evaluation of possible stroke, family member was concerned about altered speech, very slurred.  He came by EMS.  EMS did not find him to have an acute stroke syndrome but stated that the patient had a sensation of tightness of his left arm and leg.  I saw the patient at 1:53 PM.  And at this time he states his heart is "dancing," and that he has pain in his head, hips, arms and legs.  He denies further symptoms.    Home Medications Prior to Admission medications   Medication Sig Start Date End Date Taking? Authorizing Provider  amLODipine (NORVASC) 10 MG tablet Take 5 mg by mouth daily.  01/09/19   Rinaldo CloudHarwani, Mohan, MD  aspirin EC 81 MG tablet 1/2 tablet by mouth daily per Cardiologist 11/05/16   Renato Gailseed, Tiffany L, DO  Cholecalciferol (VITAMIN D3) 2000 units capsule TAKE 1 CAPSULE (2,000 UNITS TOTAL) BY MOUTH DAILY. FOR VITAMIN DAY REPLETION Patient taking differently: Take 2,000 Units by mouth daily.  01/17/18   Reed, Tiffany L, DO  clopidogrel (PLAVIX) 75 MG tablet Take 1 tablet (75 mg total) by mouth daily with breakfast. 12/17/16   Rinaldo CloudHarwani, Mohan, MD  CRESTOR 20 MG tablet Take 20 mg by mouth at bedtime. For cholesterol 08/13/14   [provider]  diclofenac Sodium (VOLTAREN) 1 % GEL Apply 1 application topically 4 (four) times daily. 09/24/20   [provider]  doxycycline (VIBRAMYCIN) 100 MG capsule Take 1 capsule (100 mg total) by mouth 2 (two) times daily. Patient not taking: Reported on 09/30/2020 01/31/19   Gilda CreasePollina, Christopher J, MD  esomeprazole (NEXIUM) 40 MG capsule TAKE ONE CAPSULE BY MOUTH ONCE DAILY BEFORE BREAKFAST FOR STOMACH Patient taking differently: Take 40 mg by mouth daily.  01/17/18   Reed,  Tiffany L, DO  glucose blood (PRODIGY NO CODING BLOOD GLUC) test strip PRODIGY TEST STRIPS, CHECK BLOOD SUGAR TWICE DAILY DX: E11.65, H54.7 PATIENT WITH BLINDNESS AND NEEDS STRIPS WITH READ ALOUD MONITOR 03/08/18   Reed, Tiffany L, DO  Insulin Lispro Prot & Lispro ([name] 75/25 KWIKPEN) (75-25) 100 UNIT/ML Kwikpen Inject 35 Units into the skin 2 (two) times daily with a meal. 10/02/20   Dahal, Melina SchoolsBinaya, MD  levalbuterol (XOPENEX) 1.25 MG/3ML nebulizer solution TAKE 1.25 MG (ONE AMPULE) BY NEBULIZATION EVERY 4 (FOUR) HOURS AS NEEDED FOR WHEEZING. Patient taking differently: Take 1.25 mg by nebulization every 4 (four) hours as needed for wheezing or shortness of breath.  01/17/18   Reed, Tiffany L, DO  lisinopril (PRINIVIL,ZESTRIL) 20 MG tablet TAKE 1 TABLET (20 MG TOTAL) BY MOUTH DAILY Patient taking differently: Take 20 mg by mouth daily.  01/17/18   Reed, Tiffany L, DO  metoprolol succinate (TOPROL-XL) 100 MG 24 hr tablet Take one tablet by mouth once daily for blood pressure 06/06/15   Reed, Tiffany L, DO  mometasone-formoterol (DULERA) 200-5 MCG/ACT AERO Inhale 2 puffs into the lungs 2 (two) times daily. 12/16/16   Rinaldo CloudHarwani, Mohan, MD  nitroGLYCERIN (NITRODUR - DOSED IN MG/24 HR) 0.4 mg/hr patch Apply 1 patch to the skin daily 01/30/19   [provider]  nitroGLYCERIN (NITROSTAT) 0.4 MG SL tablet Place 1 tablet (0.4 mg  total) under the tongue every 5 (five) minutes as needed for chest pain. 12/16/16   Rinaldo Cloud, MD  Omega 3 1200 MG CAPS Take 2,400 mg by mouth daily. For cholesterol    [provider]  PROAIR HFA 108 (90 Base) MCG/ACT inhaler INHALE TWO PUFFS INTO THE LUNGS EVERY TWO HOURS IF NEEDED Patient not taking: Reported on 09/30/2020 01/17/18   Reed, Tiffany L, DO  sertraline (ZOLOFT) 50 MG tablet TAKE ONE TABLET BY MOUTH ONCE DAILY 01/17/18   Reed, Tiffany L, DO  tiotropium (SPIRIVA) 18 MCG inhalation capsule Place 1 capsule (18 mcg total) into inhaler and inhale daily.  12/17/16   Rinaldo Cloud, MD  TRADJENTA 5 MG TABS tablet TAKE ONE TABLET BY MOUTH ONCE DAILY TO CONTROL BLOOD SUGAR Patient not taking: Reported on 09/30/2020 01/17/18   Kermit Balo, DO      Allergies    Flexeril [cyclobenzaprine] and Nsaids    Review of Systems   Review of Systems  Physical Exam Updated Vital Signs BP 124/80 (BP Location: Left Arm)    Pulse 66    Temp 97.6 F (36.4 C) (Oral)    Resp 20    Ht 5' 6.5" (1.689 m)    Wt 81.6 kg    SpO2 99%    BMI 28.62 kg/m  Physical Exam Vitals and nursing note reviewed.  Constitutional:      General: He is not in acute distress.    Appearance: He is well-developed. He is not ill-appearing, toxic-appearing or diaphoretic.  HENT:     Head: Normocephalic and atraumatic.     Right Ear: External ear normal.     Left Ear: External ear normal.  Eyes:     Conjunctiva/sclera: Conjunctivae normal.     Pupils: Pupils are equal, round, and reactive to light.  Neck:     Trachea: Phonation normal.  Cardiovascular:     Rate and Rhythm: Normal rate and regular rhythm.     Heart sounds: Normal heart sounds.  Pulmonary:     Effort: Pulmonary effort is normal.     Breath sounds: Normal breath sounds.  Abdominal:     Palpations: Abdomen is soft.     Tenderness: There is no abdominal tenderness.  Musculoskeletal:        General: Normal range of motion.     Cervical back: Normal range of motion and neck supple.     Right lower leg: Edema present.     Left lower leg: Edema present.     Comments: 1+ lower leg edema, symmetric  Skin:    General: Skin is warm and dry.  Neurological:     Mental Status: He is alert and oriented to person, place, and time.     Cranial Nerves: No cranial nerve deficit.     Sensory: No sensory deficit.     Motor: No abnormal muscle tone.     Coordination: Coordination normal.     Comments: No dysarthria, aphasia or nystagmus.  No ataxia.  Normal strength arms and legs bilaterally.  Psychiatric:        Mood  and Affect: Mood normal.        Behavior: Behavior normal.    ED Results / Procedures / Treatments   Labs (all labs ordered are listed, but only abnormal results are displayed) Labs Reviewed  COMPREHENSIVE METABOLIC PANEL - Abnormal; Notable for the following components:      Result Value   Glucose, Bld 162 (*)    Creatinine,  Ser 1.40 (*)    Albumin 3.4 (*)    GFR, Estimated 48 (*)    All other components within normal limits  RESP PANEL BY RT-PCR (FLU A&B, COVID) [id number]  CBC WITH DIFFERENTIAL/PLATELET    EKG EKG Interpretation  Date/Time:  Thursday January 01 2022 13:55:32 EST Ventricular Rate:  71 PR Interval:  273 QRS Duration: 105 QT Interval:  403 QTC Calculation: 438 R Axis:   -36 Text Interpretation: Sinus rhythm Prolonged PR interval Incomplete RBBB and LAFB Consider anterior infarct since last tracing no significant change Reconfirmed by Mancel Bale [phone]) on 01/01/2022 2:00:19 PM  Radiology DG Chest 2 View  Result Date: 01/01/2022 CLINICAL DATA:  Altered mental status EXAM: CHEST - 2 VIEW COMPARISON:  01/31/2021 FINDINGS: Transverse diameter of heart is increased. There is slight prominence of interstitial markings in both lungs. There is no focal pulmonary consolidation. There is no pleural effusion or pneumothorax. IMPRESSION: Cardiomegaly. There are no signs of alveolar pulmonary edema. There is prominence of interstitial markings in the both lungs, more so in the lower lung fields suggesting scarring and possibly superimposed interstitial pneumonia. There is no focal pulmonary consolidation. Electronically Signed   By: Ernie Avena M.D.   On: 01/01/2022 17:08   CT Head Wo Contrast  Result Date: 01/01/2022 CLINICAL DATA:  Altered mental status EXAM: CT HEAD WITHOUT CONTRAST TECHNIQUE: Contiguous axial images were obtained from the base of the skull through the vertex without intravenous contrast. RADIATION DOSE REDUCTION: This exam was performed according  to the departmental dose-optimization program which includes automated exposure control, adjustment of the mA and/or kV according to patient size and/or use of iterative reconstruction technique. COMPARISON:  Brain MRI 08/19/2014, CT head 11/06/2015 FINDINGS: Brain: There is small volume subarachnoid hemorrhage overlying right frontal lobe (3-24, 3-21). No other intracranial hemorrhage is seen. There is no acute extra-axial fluid collection. There is a small remote infarct in the right postcentral gyrus, also present in 2016. There is mild global parenchymal volume loss. Patchy hypodensity in the subcortical and periventricular white matter likely reflects sequela of mild chronic white matter microangiopathy. There is no mass lesion.  There is no midline shift. Vascular: There is calcification of the bilateral cavernous ICAs. Skull: Normal. Negative for fracture or focal lesion. Sinuses/Orbits: The imaged paranasal sinuses are clear. A right ocular prosthesis is again seen. 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 Other: None. IMPRESSION: Small volume subarachnoid hemorrhage overlying the right frontal lobe. These results were called by telephone at the time of interpretation on 01/01/2022 at 4:18 pm to provider [facility] , who verbally acknowledged these results. Electronically Signed   By: Lesia Hausen M.D.   On: 01/01/2022 16:21    Procedures Procedures    Medications Ordered in ED Medications - No data to display  ED Course/ Medical Decision Making/ A&P                           Medical Decision Making Patient presenting by EMS for possible stroke with slurred speech versus left-sided weakness.  Clinically patient does not have findings of stroke on initial evaluation in the ED.  He is a somewhat poor historian.  Screening evaluation ordered to evaluate patient for acute CNS compromise or occult illness.  The patient's major complaint is generalized pain.  Amount and/or Complexity  of Data Reviewed Labs: ordered.    Details: Complaint blood count, metabolic panel, viral panel-findings normal except mild  elevation of glucose and creatinine, albumin and GFR are slightly low Radiology: ordered.    Details: CT head-shows minimal right-sided subarachnoid hemorrhage.  No other abnormality. ECG/medicine tests: ordered.    Details: Cardiac monitor normal sinus rhythm Discussion of management or test interpretation with external provider(s): Discussion with radiologist, regarding CT images which show right sided subarachnoid hemorrhage.  Case discussed with on-call neurosurgeon, Dr. Conchita Paris.  He states that the small amount of bleeding, seen, and the clinical scenario, does not require intervention or hospitalization at this time.  He recommends symptomatic care and expectant management.  Risk Decision regarding hospitalization. Risk Details: The patient remained stable during the period of observation in the ED.  His findings show possible subarachnoid hemorrhage but no signs of external trauma.  Patient continues to [facility] normally.  I discussed the case with neurosurgery who do not believe he needs surgical intervention, or hospitalization or even repeat imaging.  I explained this to the patient he is comfortable.  His son will be coming to pick him up.  There is no indication for ongoing management or hospitalization at this time.  Routine care at home with usual drugs and activities is recommended.           Final Clinical Impression(s) / ED Diagnoses Final diagnoses:  Generalized pain  Nonintractable headache, unspecified chronicity pattern, unspecified headache type  Subarachnoid bleed [facility] [place])    Rx / DC Orders ED Discharge Orders     None         Mancel Bale, MD 01/02/22 2014

## 2022-01-01 NOTE — ED Notes (Signed)
Patient transported to CT 

## 2022-01-06 ENCOUNTER — Emergency Department (HOSPITAL_COMMUNITY): Payer: Medicare Other

## 2022-01-06 ENCOUNTER — Emergency Department (HOSPITAL_COMMUNITY)
Admission: EM | Admit: 2022-01-06 | Discharge: 2022-01-06 | Disposition: A | Discharge status: Home / Self Care | Payer: Medicare Other | Attending: Emergency Medicine | Admitting: Emergency Medicine

## 2022-01-06 ENCOUNTER — Other Ambulatory Visit: Payer: Self-pay

## 2022-01-06 DIAGNOSIS — M79621 Pain in right upper arm: Secondary | ICD-10-CM | POA: Insufficient documentation

## 2022-01-06 DIAGNOSIS — M79622 Pain in left upper arm: Secondary | ICD-10-CM | POA: Diagnosis not present

## 2022-01-06 DIAGNOSIS — Z5321 Procedure and treatment not carried out due to patient leaving prior to being seen by health care provider: Secondary | ICD-10-CM | POA: Insufficient documentation

## 2022-01-06 DIAGNOSIS — R531 Weakness: Secondary | ICD-10-CM | POA: Diagnosis not present

## 2022-01-06 LAB — DIFFERENTIAL
Abs Immature Granulocytes: 0.02 10*3/uL (ref 0.00–0.07)
Basophils Absolute: 0.1 10*3/uL (ref 0.0–0.1)
Basophils Relative: 1 %
Eosinophils Absolute: 0.5 10*3/uL (ref 0.0–0.5)
Eosinophils Relative: 7 %
Immature Granulocytes: 0 %
Lymphocytes Relative: 26 %
Lymphs Abs: 1.8 10*3/uL (ref 0.7–4.0)
Monocytes Absolute: 0.9 10*3/uL (ref 0.1–1.0)
Monocytes Relative: 13 %
Neutro Abs: 3.9 10*3/uL (ref 1.7–7.7)
Neutrophils Relative %: 53 %

## 2022-01-06 LAB — PROTIME-INR
INR: 1.1 (ref 0.8–1.2)
Prothrombin Time: 13.8 seconds (ref 11.4–15.2)

## 2022-01-06 LAB — COMPREHENSIVE METABOLIC PANEL
ALT: 12 U/L (ref 0–44)
AST: 25 U/L (ref 15–41)
Albumin: 3.5 g/dL (ref 3.5–5.0)
Alkaline Phosphatase: 102 U/L (ref 38–126)
Anion gap: 6 (ref 5–15)
BUN: 14 mg/dL (ref 8–23)
CO2: 25 mmol/L (ref 22–32)
Calcium: 9.6 mg/dL (ref 8.9–10.3)
Chloride: 104 mmol/L (ref 98–111)
Creatinine, Ser: 1.27 mg/dL — ABNORMAL HIGH (ref 0.61–1.24)
GFR, Estimated: 54 mL/min — ABNORMAL LOW (ref >60)
Glucose, Bld: 153 mg/dL — ABNORMAL HIGH (ref 70–99)
Potassium: 4.5 mmol/L (ref 3.5–5.1)
Sodium: 135 mmol/L (ref 135–145)
Total Bilirubin: 0.6 mg/dL (ref 0.3–1.2)
Total Protein: 7 g/dL (ref 6.5–8.1)

## 2022-01-06 LAB — CBC
HCT: 43.4 % (ref 39.0–52.0)
Hemoglobin: 13.9 g/dL (ref 13.0–17.0)
MCH: 29.5 pg (ref 26.0–34.0)
MCHC: 32 g/dL (ref 30.0–36.0)
MCV: 92.1 fL (ref 80.0–100.0)
Platelets: 175 10*3/uL (ref 150–400)
RBC: 4.71 MIL/uL (ref 4.22–5.81)
RDW: 12.8 % (ref 11.5–15.5)
WBC: 7.1 10*3/uL (ref 4.0–10.5)
nRBC: 0 % (ref 0.0–0.2)

## 2022-01-06 LAB — I-STAT CHEM 8, ED
BUN: 17 mg/dL (ref 8–23)
Calcium, Ion: 1.29 mmol/L (ref 1.15–1.40)
Chloride: 101 mmol/L (ref 98–111)
Creatinine, Ser: 1.3 mg/dL — ABNORMAL HIGH (ref 0.61–1.24)
Glucose, Bld: 151 mg/dL — ABNORMAL HIGH (ref 70–99)
HCT: 45 % (ref 39.0–52.0)
Hemoglobin: 15.3 g/dL (ref 13.0–17.0)
Potassium: 4.6 mmol/L (ref 3.5–5.1)
Sodium: 136 mmol/L (ref 135–145)
TCO2: 27 mmol/L (ref 22–32)

## 2022-01-06 LAB — APTT: aPTT: 29 seconds (ref 24–36)

## 2022-01-06 NOTE — ED Notes (Signed)
Pt family member came to pick them up and they both left

## 2022-01-06 NOTE — ED Triage Notes (Signed)
Pt via EMS for eval of ongoing BUE pain and weakness. Sees PT for same and uses asper cream without relief. Hx stroke last week and has no deficits from same, nor new neurological deficits today. Concerned because he could not coordinate his arms to pour coffee into a cup this morning, although at time of triage feels as though all symptoms have resolved.

## 2022-01-06 NOTE — ED Provider Triage Note (Signed)
Emergency Medicine Provider Triage Evaluation Note  Eddie Graves , a 88 y.o. male  was evaluated in triage.  Pt states that this morning when he woke up he felt a little weaker than normal but was able to get out of bed without difficulty. When he went to pour his cup of coffee he states that he noticed his left arm felt weaker and he was unable to hold onto the coffee pot and subsequently dropped it on the floor. He states that his symptoms then spread to his right arm as well. Symptoms have now resolved, however he is unable to tell me when they resolved. Endorses that he had a headache this morning as well. Of note patient is blind  Was seen here on 1/26 and found to have a small [facility] which neurosurgery stated was not surgical and was small enough to resolve on its own.  Review of Systems  Positive:  Negative: See above  Physical Exam  BP 127/67 (BP Location: Left Arm)    Pulse 62    Temp 98.1 F (36.7 C) (Oral)    Resp 18    SpO2 97%  Gen:   Awake, no distress   Resp:  Normal effort  MSK:   Moves extremities without difficulty  Other:  Overall neurologically intact  Medical Decision Making  Medically screening exam initiated at 9:57 AM.  Appropriate orders placed.  Eddie Graves was informed that the remainder of the evaluation will be completed by another provider, this initial triage assessment does not replace that evaluation, and the importance of remaining in the ED until their evaluation is complete.     Bud Face, PA-C 01/06/22 1005

## 2022-09-30 IMAGING — DX DG CHEST 2V
2 series · 2 of 2 positions shown · non-contrast
Comparison: January 31, 2019.

CLINICAL DATA: Chest pain, shortness of breath.

EXAM:
CHEST - 2 VIEW

[chest ap]
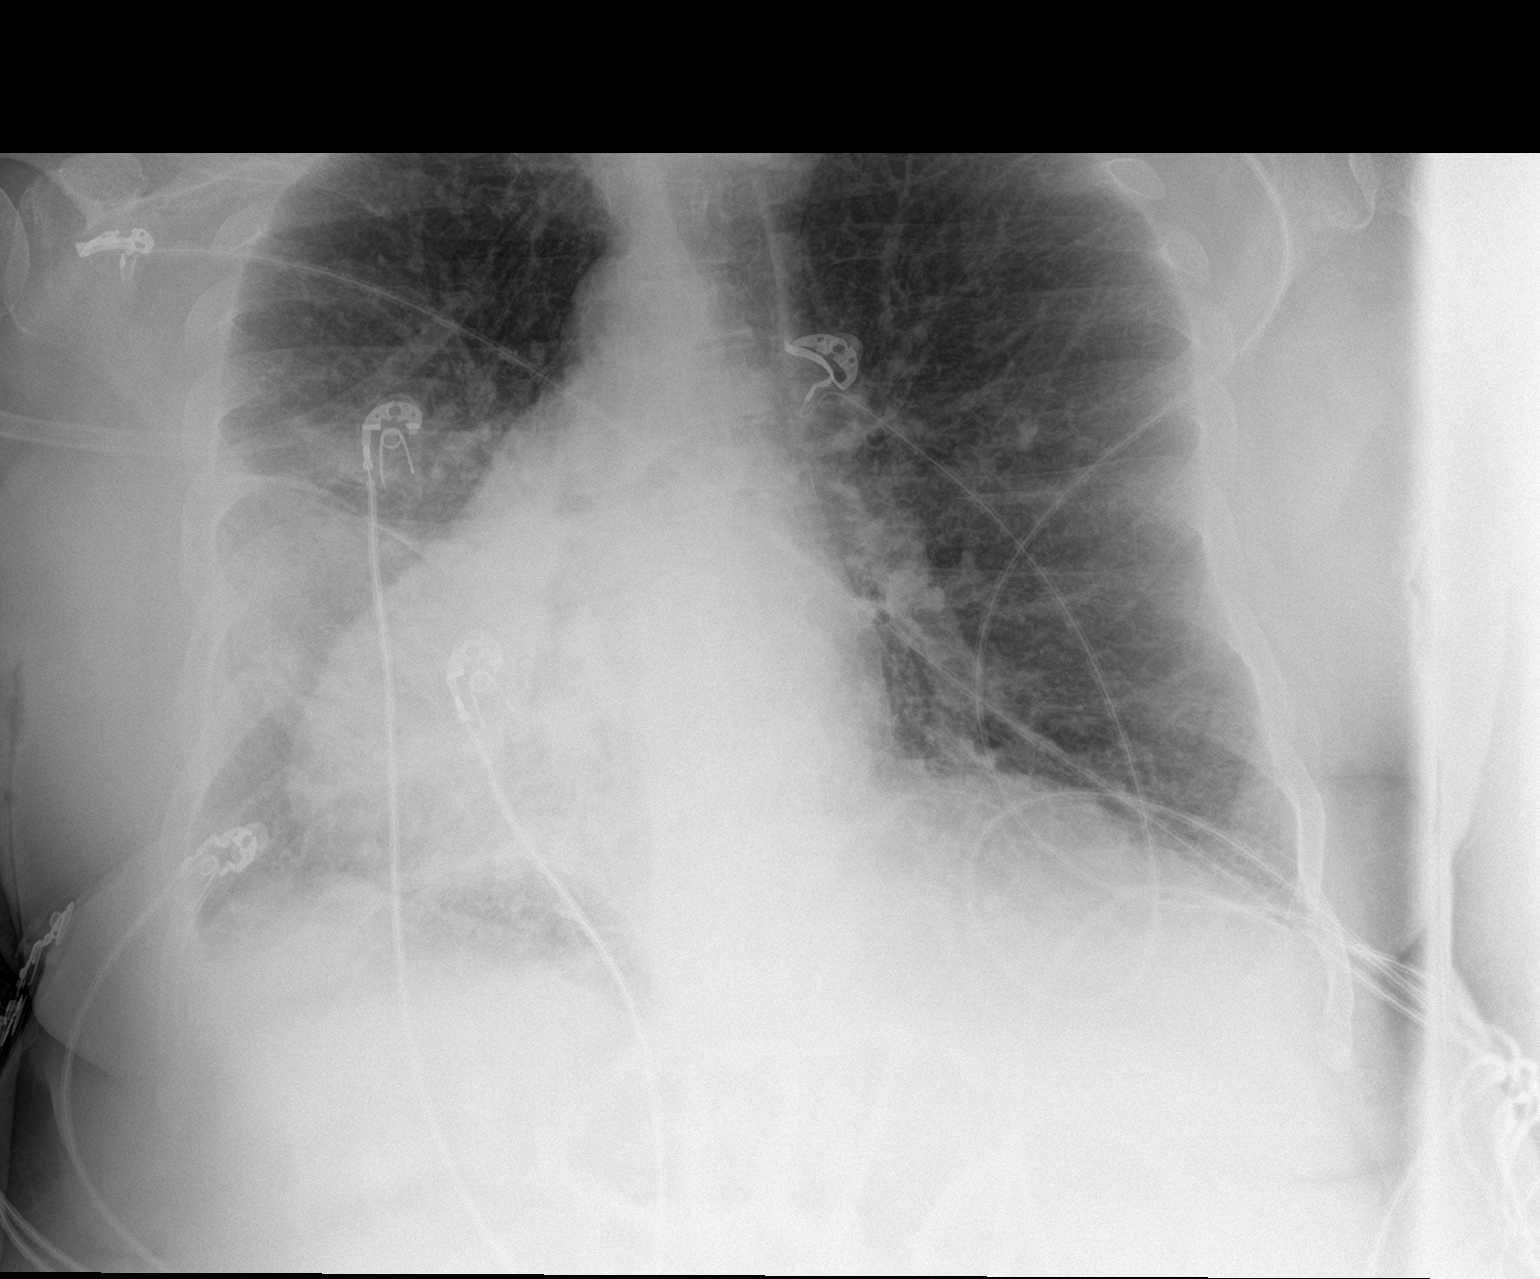

[chest lat]
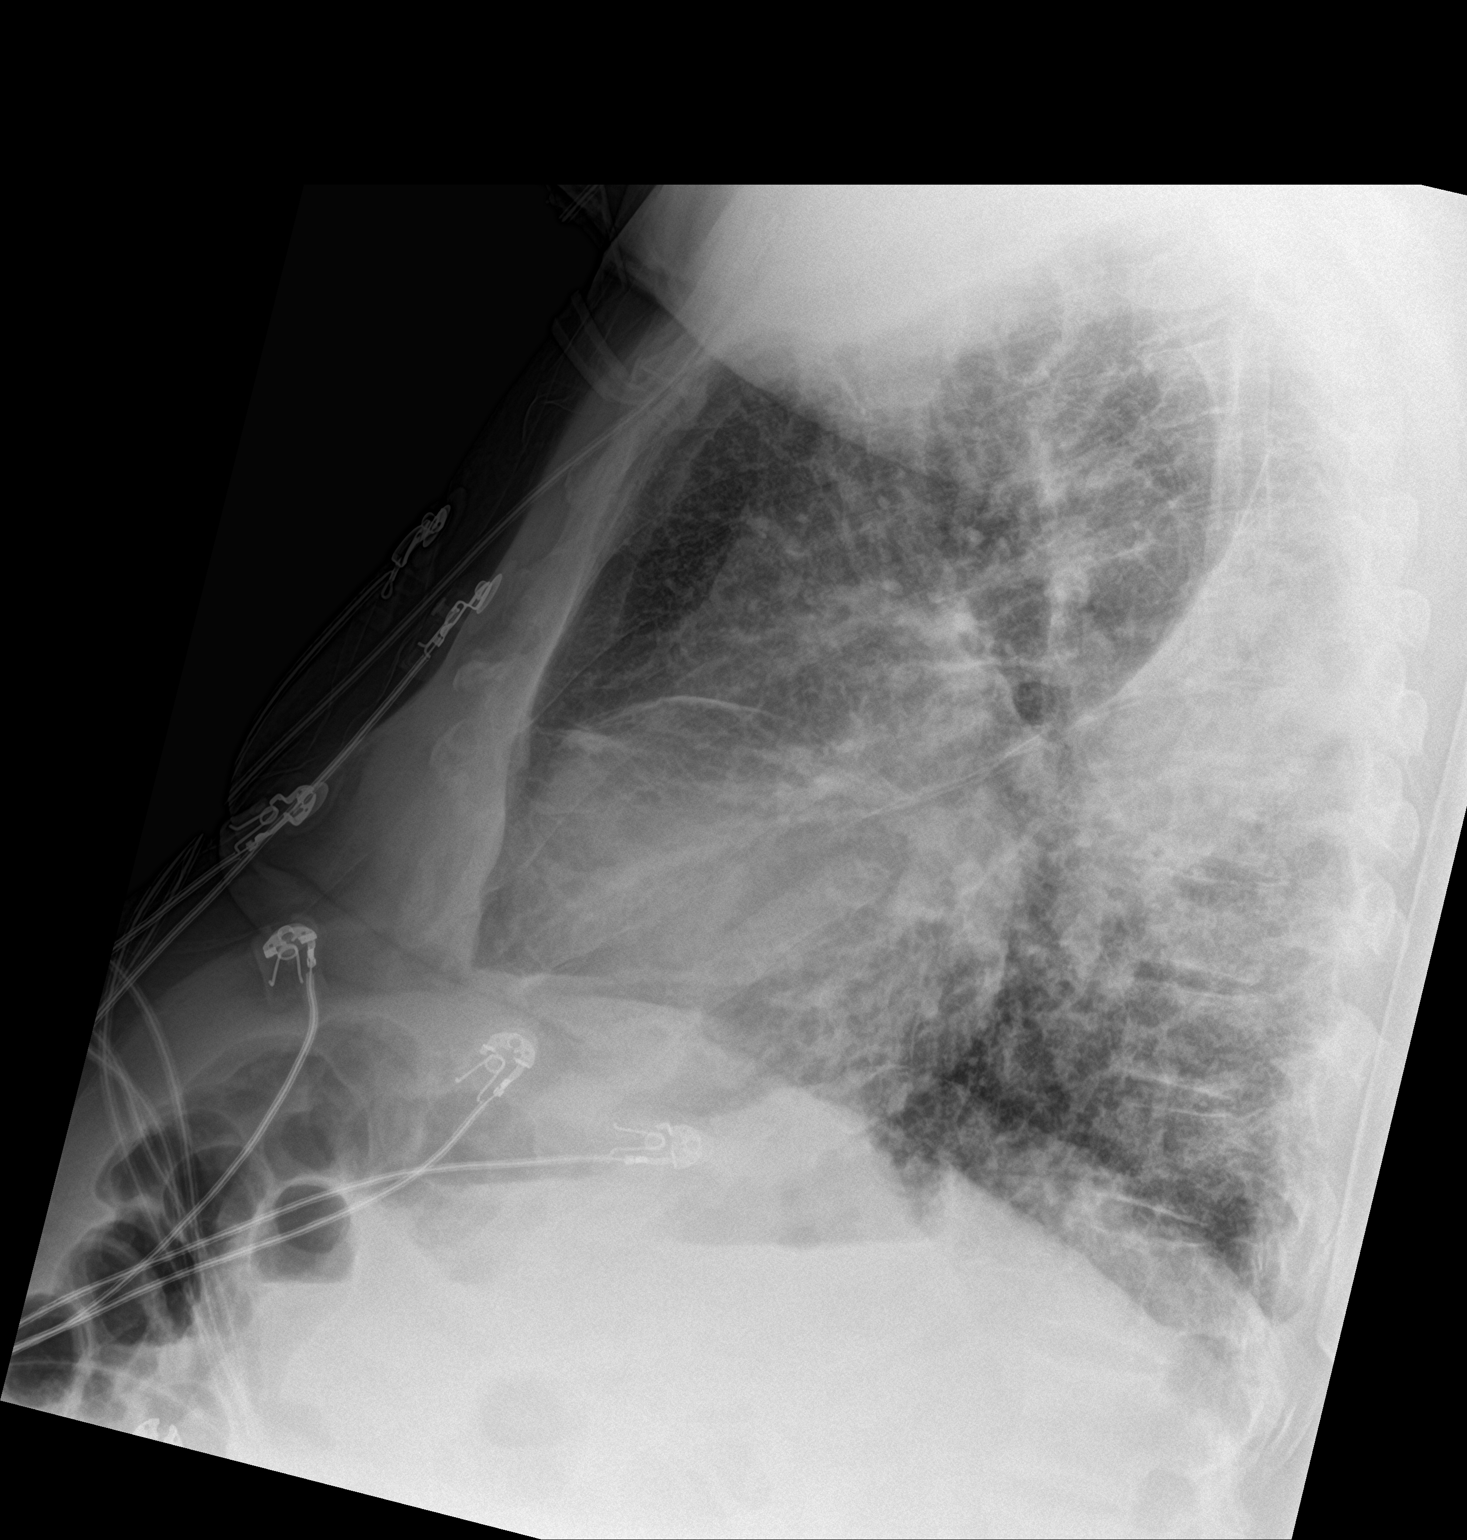

[2 of 2 positions shown; findings below may reference images not displayed]

FINDINGS: Stable cardiomediastinal silhouette. No pneumothorax is noted. Right
lung is clear. Interval development of airspace opacity involving
superior segment of left lower lobe consistent with pneumonia. Bony
thorax is unremarkable.
IMPRESSION: Interval development of left lower lobe pneumonia. Followup PA and
lateral chest X-ray is recommended in 3-4 weeks following trial of
antibiotic therapy to ensure resolution and exclude underlying
malignancy.

## 2023-01-31 IMAGING — DX DG CHEST 2V
2 series · 2 of 2 positions shown · non-contrast
Comparison: 11/13/2020

CLINICAL DATA: Cough and short of breath

EXAM:
CHEST - 2 VIEW

[dg chest 2 view (1 of 2)]
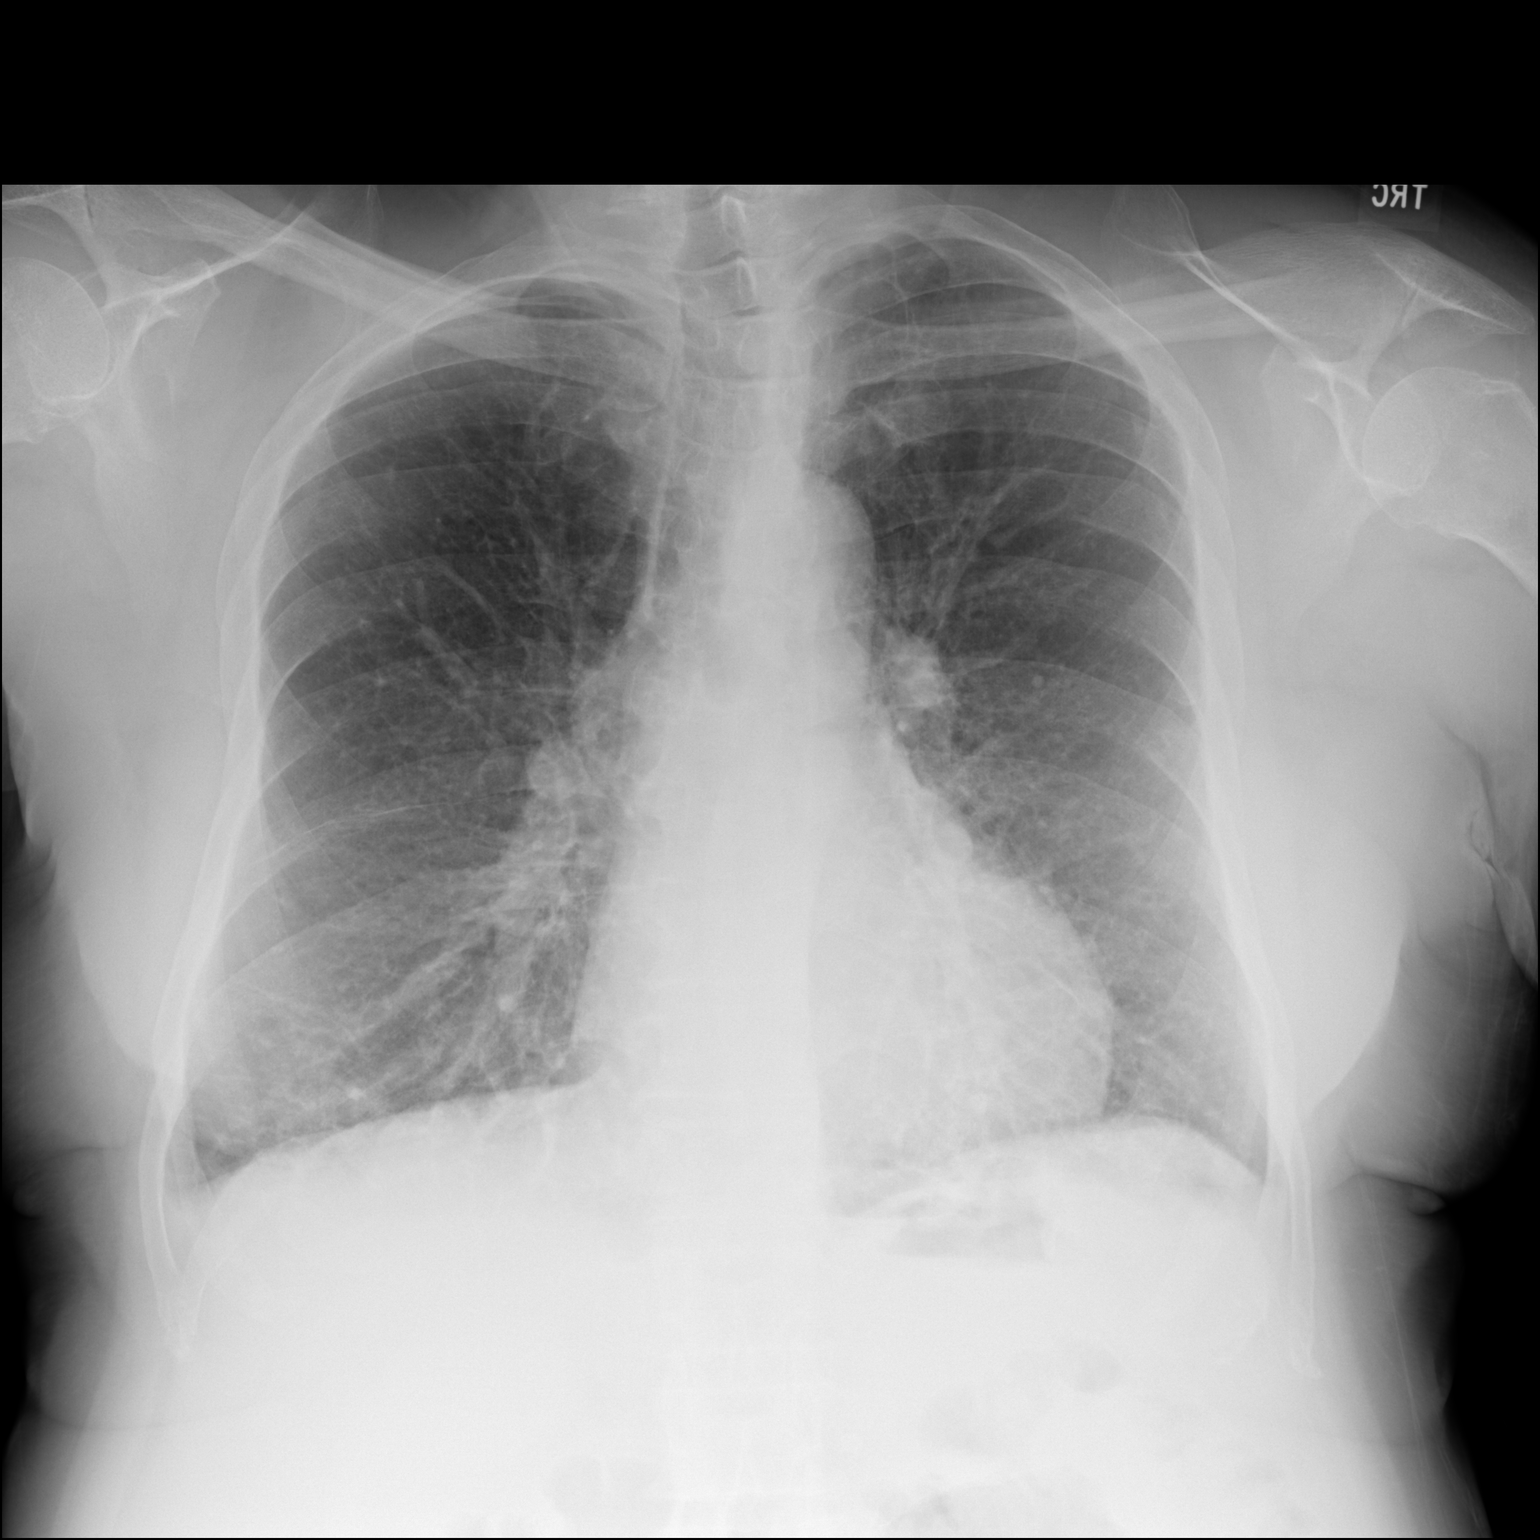

[dg chest 2 view (2 of 2)]
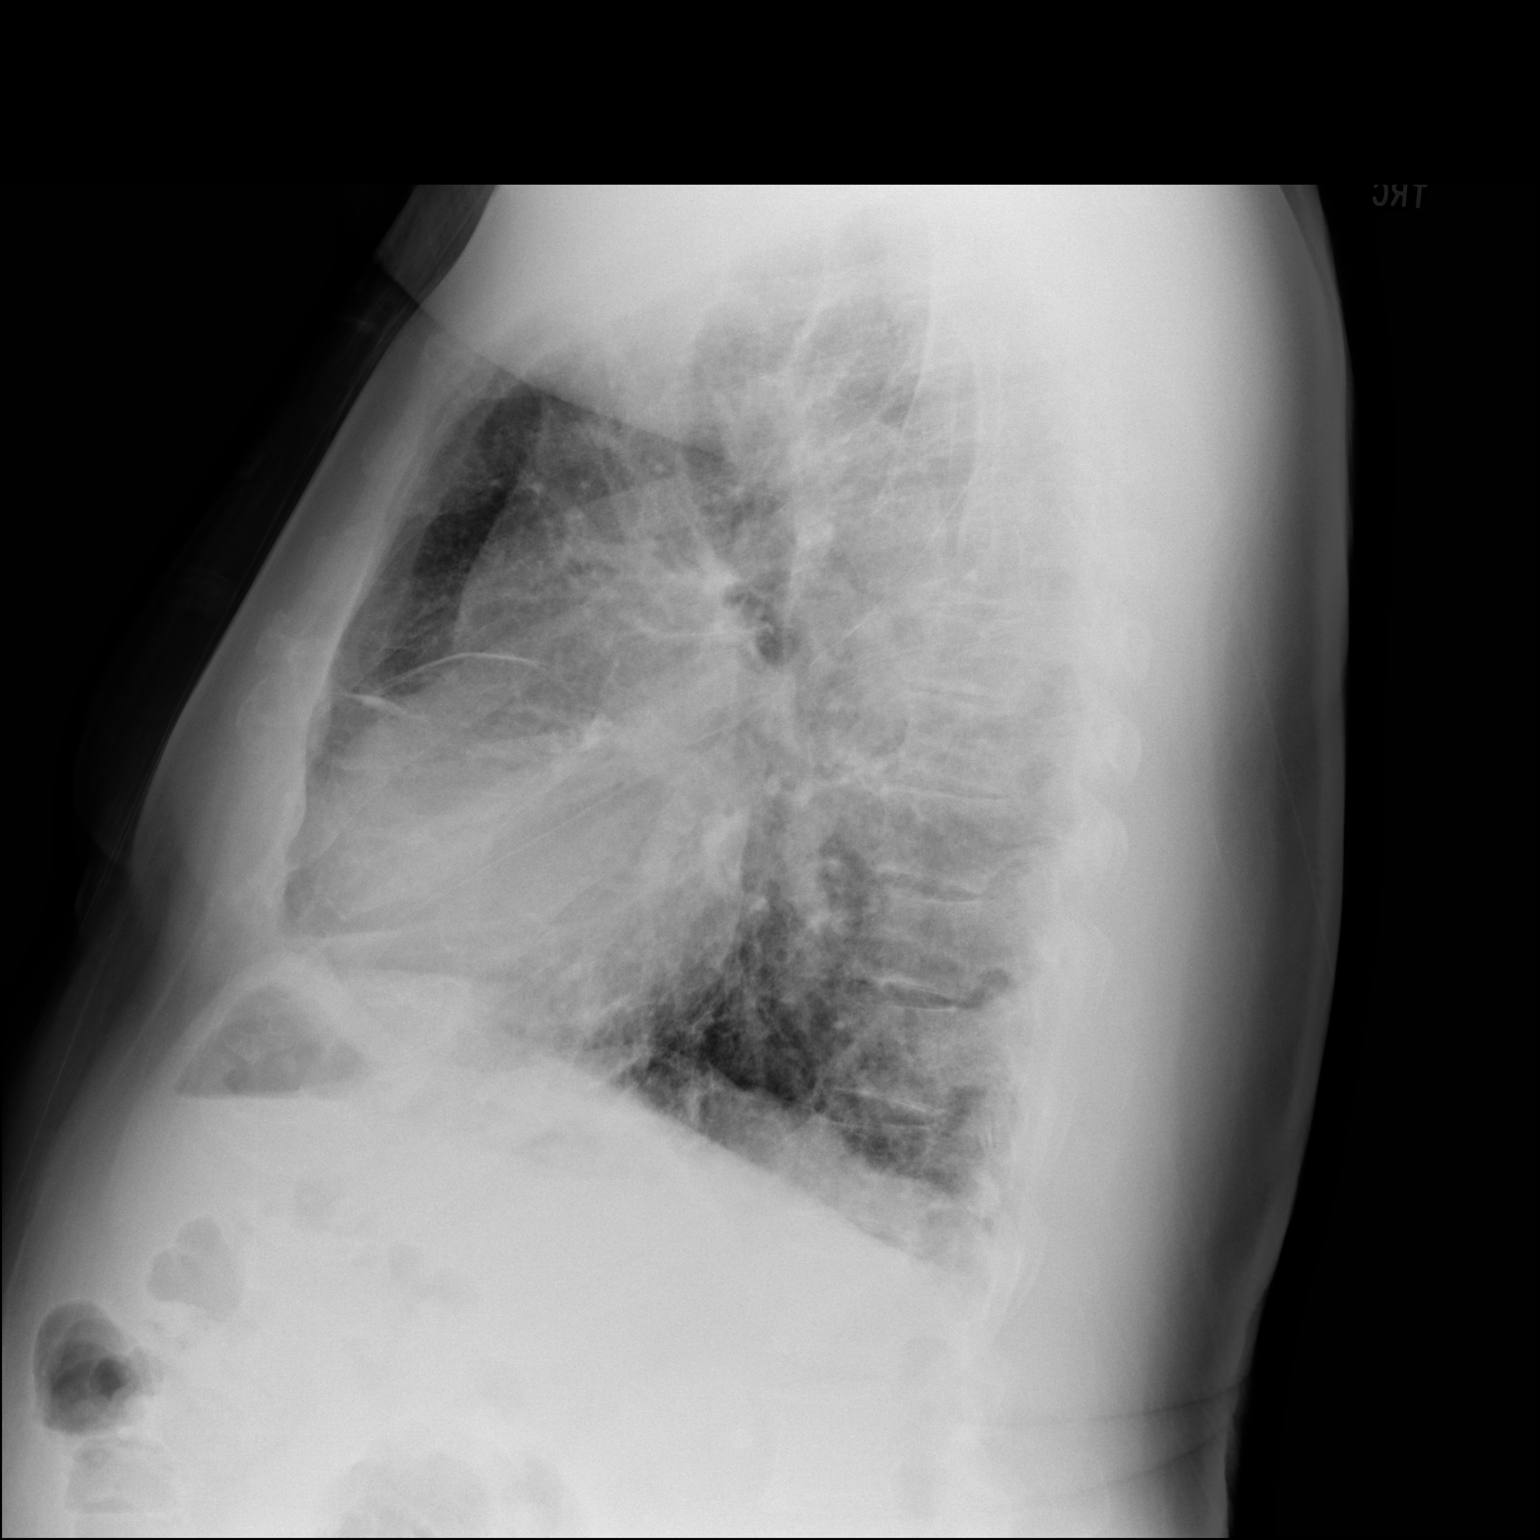

[2 of 2 positions shown; findings below may reference images not displayed]

FINDINGS: Heart size within normal limits.  Vascularity normal.

Prominent reticular lung markings are present in the bases
bilaterally, unchanged. No acute infiltrate or effusion.
IMPRESSION: Interstitial fibrosis.  No acute abnormality.

## 2023-08-06 DIAGNOSIS — K219 Gastro-esophageal reflux disease without esophagitis: Secondary | ICD-10-CM | POA: Diagnosis present

## 2023-08-18 ENCOUNTER — Emergency Department (HOSPITAL_COMMUNITY)

## 2023-08-18 ENCOUNTER — Emergency Department (HOSPITAL_COMMUNITY)
Admission: EM | Admit: 2023-08-18 | Discharge: 2023-08-19 | Disposition: A | Discharge status: Home / Self Care | Attending: Emergency Medicine | Admitting: Emergency Medicine

## 2023-08-18 DIAGNOSIS — R930 Abnormal findings on diagnostic imaging of skull and head, not elsewhere classified: Secondary | ICD-10-CM | POA: Diagnosis not present

## 2023-08-18 DIAGNOSIS — M25821 Other specified joint disorders, right elbow: Secondary | ICD-10-CM | POA: Diagnosis not present

## 2023-08-18 DIAGNOSIS — K5730 Diverticulosis of large intestine without perforation or abscess without bleeding: Secondary | ICD-10-CM | POA: Insufficient documentation

## 2023-08-18 DIAGNOSIS — Z7982 Long term (current) use of aspirin: Secondary | ICD-10-CM | POA: Insufficient documentation

## 2023-08-18 DIAGNOSIS — Z7902 Long term (current) use of antithrombotics/antiplatelets: Secondary | ICD-10-CM | POA: Diagnosis not present

## 2023-08-18 DIAGNOSIS — K4090 Unilateral inguinal hernia, without obstruction or gangrene, not specified as recurrent: Secondary | ICD-10-CM | POA: Insufficient documentation

## 2023-08-18 DIAGNOSIS — Z794 Long term (current) use of insulin: Secondary | ICD-10-CM | POA: Diagnosis not present

## 2023-08-18 DIAGNOSIS — E278 Other specified disorders of adrenal gland: Secondary | ICD-10-CM | POA: Insufficient documentation

## 2023-08-18 DIAGNOSIS — M19011 Primary osteoarthritis, right shoulder: Secondary | ICD-10-CM | POA: Diagnosis not present

## 2023-08-18 DIAGNOSIS — S0990XA Unspecified injury of head, initial encounter: Secondary | ICD-10-CM | POA: Diagnosis present

## 2023-08-18 DIAGNOSIS — N281 Cyst of kidney, acquired: Secondary | ICD-10-CM | POA: Insufficient documentation

## 2023-08-18 DIAGNOSIS — W19XXXA Unspecified fall, initial encounter: Secondary | ICD-10-CM | POA: Diagnosis not present

## 2023-08-18 DIAGNOSIS — R911 Solitary pulmonary nodule: Secondary | ICD-10-CM | POA: Diagnosis not present

## 2023-08-18 DIAGNOSIS — Z79899 Other long term (current) drug therapy: Secondary | ICD-10-CM | POA: Insufficient documentation

## 2023-08-18 DIAGNOSIS — Y9301 Activity, walking, marching and hiking: Secondary | ICD-10-CM | POA: Diagnosis not present

## 2023-08-18 DIAGNOSIS — Y92019 Unspecified place in single-family (private) house as the place of occurrence of the external cause: Secondary | ICD-10-CM | POA: Insufficient documentation

## 2023-08-18 DIAGNOSIS — S3991XA Unspecified injury of abdomen, initial encounter: Secondary | ICD-10-CM | POA: Diagnosis present

## 2023-08-18 DIAGNOSIS — M79601 Pain in right arm: Secondary | ICD-10-CM | POA: Diagnosis present

## 2023-08-18 LAB — COMPREHENSIVE METABOLIC PANEL
ALT: 14 U/L (ref 0–44)
AST: 27 U/L (ref 15–41)
Albumin: 3.2 g/dL — ABNORMAL LOW (ref 3.5–5.0)
Alkaline Phosphatase: 92 U/L (ref 38–126)
Anion gap: 12 (ref 5–15)
BUN: 23 mg/dL (ref 8–23)
CO2: 22 mmol/L (ref 22–32)
Calcium: 9.2 mg/dL (ref 8.9–10.3)
Chloride: 97 mmol/L — ABNORMAL LOW (ref 98–111)
Creatinine, Ser: 1.49 mg/dL — ABNORMAL HIGH (ref 0.61–1.24)
GFR, Estimated: 44 mL/min — ABNORMAL LOW (ref >60)
Glucose, Bld: 258 mg/dL — ABNORMAL HIGH (ref 70–99)
Potassium: 4.4 mmol/L (ref 3.5–5.1)
Sodium: 131 mmol/L — ABNORMAL LOW (ref 135–145)
Total Bilirubin: 0.5 mg/dL (ref 0.3–1.2)
Total Protein: 6.6 g/dL (ref 6.5–8.1)

## 2023-08-18 LAB — CBC
HCT: 36.5 % — ABNORMAL LOW (ref 39.0–52.0)
Hemoglobin: 11.7 g/dL — ABNORMAL LOW (ref 13.0–17.0)
MCH: 28.5 pg (ref 26.0–34.0)
MCHC: 32.1 g/dL (ref 30.0–36.0)
MCV: 88.8 fL (ref 80.0–100.0)
Platelets: 206 10*3/uL (ref 150–400)
RBC: 4.11 MIL/uL — ABNORMAL LOW (ref 4.22–5.81)
RDW: 12.9 % (ref 11.5–15.5)
WBC: 6.9 10*3/uL (ref 4.0–10.5)
nRBC: 0 % (ref 0.0–0.2)

## 2023-08-18 LAB — PROTIME-INR
INR: 1 (ref 0.8–1.2)
Prothrombin Time: 13.6 s (ref 11.4–15.2)

## 2023-08-18 LAB — SAMPLE TO BLOOD BANK

## 2023-08-18 LAB — ETHANOL: Alcohol, Ethyl (B): <10 mg/dL (ref <10)

## 2023-08-18 MED ORDER — IOHEXOL 350 MG/ML SOLN
75.0000 mL | Freq: Once | INTRAVENOUS | Status: AC | PRN
  Administered 2023-08-18: 75 mL via INTRAVENOUS

## 2023-08-18 MED ORDER — FENTANYL CITRATE PF 50 MCG/ML IJ SOSY
100.0000 ug | PREFILLED_SYRINGE | Freq: Once | INTRAMUSCULAR | Status: AC
  Administered 2023-08-18: 100 ug via INTRAVENOUS

## 2023-08-18 MED ORDER — HYDROMORPHONE HCL 1 MG/ML IJ SOLN
INTRAMUSCULAR | Status: AC
  Filled 2023-08-18: qty 1

## 2023-08-18 MED ORDER — HYDROMORPHONE HCL 1 MG/ML IJ SOLN
1.0000 mg | Freq: Once | INTRAMUSCULAR | Status: AC
  Administered 2023-08-18: 1 mg via INTRAVENOUS

## 2023-08-18 MED ORDER — FENTANYL CITRATE PF 50 MCG/ML IJ SOSY
PREFILLED_SYRINGE | INTRAMUSCULAR | Status: AC
  Filled 2023-08-18: qty 2

## 2023-08-18 NOTE — Discharge Instructions (Signed)
As discussed, your evaluation today has been largely reassuring.  But, it is important that you monitor your condition carefully, and do not hesitate to return to the ED if you develop new, or concerning changes in your condition. ? ?Otherwise, please follow-up with your physician for appropriate ongoing care. ? ?

## 2023-08-18 NOTE — ED Triage Notes (Signed)
Pt is coming in from home from an unwitnessed fall, allegedly he was walking to the bathroom with his walker and the wife found him an hour later face down in the floor. Pt comes in with reported deformities in the right hip and right shoulder as well as seom left wrist pain. Pt is blind as well. He is in C-collar per medic protocol and has been switched to a [place] collar upon arrival.

## 2023-08-18 NOTE — ED Notes (Addendum)
Trauma Response Nurse Documentation   Eddie Graves is a 89 y.o. male arriving to [facility] ED via EMS  On clopidogrel 75 mg daily. Trauma was activated as a Level 2 by ED charge RN based on the following trauma criteria Elderly patients > 65 with head trauma on anti-coagulation (excluding ASA).  Patient cleared for CT by Dr. Jeraldine Loots EDP. Pt transported to CT with trauma response nurse present to monitor. RN remained with the patient throughout their absence from the department for clinical observation.   GCS 14 confused with unclear baseline.  History   Past Medical History:  Diagnosis Date   Anxiety state, unspecified    Blind    Chronic airway obstruction, not elsewhere classified    Coronary atherosclerosis of native coronary artery    Cough    Depressive disorder, not elsewhere classified    DM (diabetes mellitus) type II controlled peripheral vascular disorder    Essential hypertension, benign    H/O hiatal hernia    Headache(784.0)    Legal blindness, as defined in [place]    Memory loss    Other and unspecified hyperlipidemia    Pain in joint, site unspecified    Palpitations    Rash and other nonspecific skin eruption    Reflux esophagitis    Sebaceous cyst    Spinal stenosis, lumbar region, with neurogenic claudication    Type II diabetes mellitus with neurological manifestations, uncontrolled (HCC)    Unspecified constipation    Vitamin D deficiency      Past Surgical History:  Procedure Laterality Date   CARDIAC CATHETERIZATION N/A 12/15/2016   Procedure: Left Heart Cath and Coronary Angiography;  Surgeon: Rinaldo Cloud, MD;  Location: [facility] INVASIVE CV LAB;  Service: Cardiovascular;  Laterality: N/A;   CORONARY ANGIOPLASTY WITH STENT PLACEMENT     EYE SURGERY     right prosthetic globe   HERNIA REPAIR         Initial Focused Assessment (If applicable, or please see trauma documentation): Alert/confused male presents via EMS from home after an unwitnessed fall,  found down by wife. Right shoulder and right hip pain per report  Airway patent unobstructed, BS diminished/coarse No obvious uncontrolled hemorrhage GCS 14, unable to access pupils, blind  CT's Completed:   CT Head, CT C-Spine, CT Chest w/ contrast, and CT abdomen/pelvis w/ contrast   Interventions:  IV start and trauma lab draw Portable chest, pelvis and right shoulder XRAY CT head, c-spine, chest/abd/pelvis Fentanyl, dilaudid for pain control [place] J   Plan for disposition:  D/c home  Consults completed:  None  Event Summary: Pt presents from home after an unwitnessed ground level fall, found down by wife. EMS reports deformities/pain to right hip and right shoulder. EMS collar changed to [facility] J on arrival. Pt confused, unclear baseline. Follows some commands. Portable xray with possible right femoral neck fx. Escorted to CT with no acute fxs noted. D/C home  MTP Summary (If applicable): NA  Bedside handoff with ED RN Pennie Rushing.    Camron Monday O Kayn Haymore  Trauma Response RN  Please call TRN at [phone] for further assistance.

## 2023-08-18 NOTE — ED Provider Notes (Signed)
[place] EMERGENCY DEPARTMENT AT [facility] Provider Note   CSN: [id number] Arrival date & time: 08/18/23  2051     History  Chief Complaint  Patient presents with   Eddie Graves is a 89 y.o. male.  HPI Patient presents via EMS after a fall.  Circumstances not clear as the patient is blind, was going from home and his wife was present, but not reportedly offering substantial contributions to the history. EMS reports the patient was found on the ground.  The patient self complains of pain seemingly on the right side, but possibly all over. EMS reports patient was hemodynamically unremarkable in transport.     Home Medications Prior to Admission medications   Medication Sig Start Date End Date Taking? Authorizing Provider  amLODipine (NORVASC) 10 MG tablet Take 5 mg by mouth daily.  01/09/19   Rinaldo Cloud, MD  aspirin EC 81 MG tablet 1/2 tablet by mouth daily per Cardiologist 11/05/16   Renato Gails, Tiffany L, DO  Cholecalciferol (VITAMIN D3) 2000 units capsule TAKE 1 CAPSULE (2,000 UNITS TOTAL) BY MOUTH DAILY. FOR VITAMIN DAY REPLETION Patient taking differently: Take 2,000 Units by mouth daily.  01/17/18   Reed, Tiffany L, DO  clopidogrel (PLAVIX) 75 MG tablet Take 1 tablet (75 mg total) by mouth daily with breakfast. 12/17/16   Rinaldo Cloud, MD  CRESTOR 20 MG tablet Take 20 mg by mouth at bedtime. For cholesterol 08/13/14   [provider]  diclofenac Sodium (VOLTAREN) 1 % GEL Apply 1 application topically 4 (four) times daily. 09/24/20   [provider]  doxycycline (VIBRAMYCIN) 100 MG capsule Take 1 capsule (100 mg total) by mouth 2 (two) times daily. Patient not taking: Reported on 09/30/2020 01/31/19   Gilda Crease, MD  esomeprazole (NEXIUM) 40 MG capsule TAKE ONE CAPSULE BY MOUTH ONCE DAILY BEFORE BREAKFAST FOR STOMACH Patient taking differently: Take 40 mg by mouth daily.  01/17/18   Reed, Tiffany L, DO  glucose blood (PRODIGY NO  CODING BLOOD GLUC) test strip PRODIGY TEST STRIPS, CHECK BLOOD SUGAR TWICE DAILY DX: E11.65, H54.7 PATIENT WITH BLINDNESS AND NEEDS STRIPS WITH READ ALOUD MONITOR 03/08/18   Reed, Tiffany L, DO  Insulin Lispro Prot & Lispro ([name] 75/25 KWIKPEN) (75-25) 100 UNIT/ML Kwikpen Inject 35 Units into the skin 2 (two) times daily with a meal. 10/02/20   Dahal, Melina Schools, MD  levalbuterol (XOPENEX) 1.25 MG/3ML nebulizer solution TAKE 1.25 MG (ONE AMPULE) BY NEBULIZATION EVERY 4 (FOUR) HOURS AS NEEDED FOR WHEEZING. Patient taking differently: Take 1.25 mg by nebulization every 4 (four) hours as needed for wheezing or shortness of breath.  01/17/18   Reed, Tiffany L, DO  lisinopril (PRINIVIL,ZESTRIL) 20 MG tablet TAKE 1 TABLET (20 MG TOTAL) BY MOUTH DAILY Patient taking differently: Take 20 mg by mouth daily.  01/17/18   Reed, Tiffany L, DO  metoprolol succinate (TOPROL-XL) 100 MG 24 hr tablet Take one tablet by mouth once daily for blood pressure 06/06/15   Reed, Tiffany L, DO  mometasone-formoterol (DULERA) 200-5 MCG/ACT AERO Inhale 2 puffs into the lungs 2 (two) times daily. 12/16/16   Rinaldo Cloud, MD  nitroGLYCERIN (NITRODUR - DOSED IN MG/24 HR) 0.4 mg/hr patch Apply 1 patch to the skin daily 01/30/19   [provider]  nitroGLYCERIN (NITROSTAT) 0.4 MG SL tablet Place 1 tablet (0.4 mg total) under the tongue every 5 (five) minutes as needed for chest pain. 12/16/16   Rinaldo Cloud, MD  Omega 3 1200 MG CAPS Take 2,400 mg by mouth daily. For cholesterol    [provider]  PROAIR HFA 108 (90 Base) MCG/ACT inhaler INHALE TWO PUFFS INTO THE LUNGS EVERY TWO HOURS IF NEEDED Patient not taking: Reported on 09/30/2020 01/17/18   Reed, Tiffany L, DO  sertraline (ZOLOFT) 50 MG tablet TAKE ONE TABLET BY MOUTH ONCE DAILY 01/17/18   Reed, Tiffany L, DO  tiotropium (SPIRIVA) 18 MCG inhalation capsule Place 1 capsule (18 mcg total) into inhaler and inhale daily. 12/17/16   Rinaldo Cloud, MD  TRADJENTA 5 MG  TABS tablet TAKE ONE TABLET BY MOUTH ONCE DAILY TO CONTROL BLOOD SUGAR Patient not taking: Reported on 09/30/2020 01/17/18   Bufford Spikes L, DO      Allergies    Flexeril [cyclobenzaprine] and Nsaids    Review of Systems   Review of Systems  Unable to perform ROS: Acuity of condition    Physical Exam Updated Vital Signs BP (!) 146/78   Pulse 71   Resp 12   SpO2 95%  Physical Exam Vitals and nursing note reviewed.  Constitutional:      General: He is not in acute distress.    Appearance: He is well-developed.     Comments: Uncomfortable appearing elderly male awake and alert  HENT:     Head: Normocephalic and atraumatic.  Eyes:     Conjunctiva/sclera: Conjunctivae normal.  Cardiovascular:     Rate and Rhythm: Normal rate and regular rhythm.  Pulmonary:     Effort: Pulmonary effort is normal. No respiratory distress.     Breath sounds: No stridor.  Abdominal:     General: There is no distension.  Musculoskeletal:     Comments: Pelvis stable, patient moves all extremity spontaneously, including both lower extremities. He does complain of pain in the right arm, no deformity.   Skin:    General: Skin is warm and dry.  Neurological:     Mental Status: He is alert and oriented to person, place, and time.     Comments: Patient is blind.     ED Results / Procedures / Treatments   Labs (all labs ordered are listed, but only abnormal results are displayed) Labs Reviewed  COMPREHENSIVE METABOLIC PANEL - Abnormal; Notable for the following components:      Result Value   Sodium 131 (*)    Chloride 97 (*)    Glucose, Bld 258 (*)    Creatinine, Ser 1.49 (*)    Albumin 3.2 (*)    GFR, Estimated 44 (*)    All other components within normal limits  CBC - Abnormal; Notable for the following components:   RBC 4.11 (*)    Hemoglobin 11.7 (*)    HCT 36.5 (*)    All other components within normal limits  ETHANOL  PROTIME-INR  URINALYSIS, ROUTINE W REFLEX MICROSCOPIC   I-STAT CHEM 8, ED  I-STAT CG4 LACTIC ACID, ED  SAMPLE TO BLOOD BANK    EKG None  Radiology DG Shoulder Right Port  Result Date: 08/18/2023 CLINICAL DATA:  Blunt right shoulder trauma EXAM: RIGHT SHOULDER - 1 VIEW COMPARISON:  None Available. FINDINGS: Normal alignment. No acute fracture or dislocation. Mild acromioclavicular degenerative arthritis. Glenohumeral joint space is preserved. Limited evaluation of the right hemithorax demonstrates interstitial thickening suggesting changes of underlying interstitial lung disease or pulmonary edema. IMPRESSION: 1. Mild acromioclavicular degenerative arthritis. 2. Interstitial thickening suggesting changes of underlying interstitial lung disease or pulmonary edema. Electronically Signed   By:  Helyn Numbers M.D.   On: 08/18/2023 22:50   CT HEAD WO CONTRAST  Result Date: 08/18/2023 CLINICAL DATA:  Trauma. EXAM: CT HEAD WITHOUT CONTRAST CT CERVICAL SPINE WITHOUT CONTRAST TECHNIQUE: Multidetector CT imaging of the head and cervical spine was performed following the standard protocol without intravenous contrast. Multiplanar CT image reconstructions of the cervical spine were also generated. RADIATION DOSE REDUCTION: This exam was performed according to the departmental dose-optimization program which includes automated exposure control, adjustment of the mA and/or kV according to patient size and/or use of iterative reconstruction technique. COMPARISON:  Head CT dated 01/06/2022. FINDINGS: CT HEAD FINDINGS Brain: Mild age-related atrophy and chronic microvascular ischemic changes. There is no acute intracranial hemorrhage. No mass effect or midline shift. No extra-axial fluid collection. Vascular: No hyperdense vessel or unexpected calcification. Skull: Normal. Negative for fracture or focal lesion. Sinuses/Orbits: The visualized paranasal sinuses and mastoid air cells are clear. Chronic changes of bilateral globes. Other: None CT CERVICAL SPINE FINDINGS  Alignment: No acute subluxation. There is straightening of normal cervical lordosis which may be positional or due to muscle spasm. Skull base and vertebrae: No acute fracture.  Osteopenia. Soft tissues and spinal canal: No prevertebral fluid or swelling. No visible canal hematoma. Disc levels: No acute findings. Degenerative changes. C5-C6 ankylosis. Upper chest: Biapical subpleural scarring. Other: None IMPRESSION: 1. No acute intracranial pathology. Mild age-related atrophy and chronic microvascular ischemic changes. 2. No acute/traumatic cervical spine pathology. Electronically Signed   By: Elgie Collard M.D.   On: 08/18/2023 22:32   CT Cervical Spine Wo Contrast  Result Date: 08/18/2023 CLINICAL DATA:  Trauma. EXAM: CT HEAD WITHOUT CONTRAST CT CERVICAL SPINE WITHOUT CONTRAST TECHNIQUE: Multidetector CT imaging of the head and cervical spine was performed following the standard protocol without intravenous contrast. Multiplanar CT image reconstructions of the cervical spine were also generated. RADIATION DOSE REDUCTION: This exam was performed according to the departmental dose-optimization program which includes automated exposure control, adjustment of the mA and/or kV according to patient size and/or use of iterative reconstruction technique. COMPARISON:  Head CT dated 01/06/2022. FINDINGS: CT HEAD FINDINGS Brain: Mild age-related atrophy and chronic microvascular ischemic changes. There is no acute intracranial hemorrhage. No mass effect or midline shift. No extra-axial fluid collection. Vascular: No hyperdense vessel or unexpected calcification. Skull: Normal. Negative for fracture or focal lesion. Sinuses/Orbits: The visualized paranasal sinuses and mastoid air cells are clear. Chronic changes of bilateral globes. Other: None CT CERVICAL SPINE FINDINGS Alignment: No acute subluxation. There is straightening of normal cervical lordosis which may be positional or due to muscle spasm. Skull base and  vertebrae: No acute fracture.  Osteopenia. Soft tissues and spinal canal: No prevertebral fluid or swelling. No visible canal hematoma. Disc levels: No acute findings. Degenerative changes. C5-C6 ankylosis. Upper chest: Biapical subpleural scarring. Other: None IMPRESSION: 1. No acute intracranial pathology. Mild age-related atrophy and chronic microvascular ischemic changes. 2. No acute/traumatic cervical spine pathology. Electronically Signed   By: Elgie Collard M.D.   On: 08/18/2023 22:32   CT CHEST ABDOMEN PELVIS W CONTRAST  Result Date: 08/18/2023 CLINICAL DATA:  Trauma EXAM: CT CHEST, ABDOMEN, AND PELVIS WITH CONTRAST TECHNIQUE: Multidetector CT imaging of the chest, abdomen and pelvis was performed following the standard protocol during bolus administration of intravenous contrast. RADIATION DOSE REDUCTION: This exam was performed according to the departmental dose-optimization program which includes automated exposure control, adjustment of the mA and/or kV according to patient size and/or use of iterative reconstruction technique. CONTRAST:  75mL OMNIPAQUE IOHEXOL 350 MG/ML SOLN COMPARISON:  CT chest 08/06/2013 FINDINGS: CT CHEST FINDINGS Cardiovascular: No significant vascular findings. Normal heart size. No pericardial effusion. There are atherosclerotic calcifications of the aorta and coronary arteries. Mediastinum/Nodes: There are calcified mediastinal and hilar lymph nodes compatible with old granulomatous disease. Esophagus and visualized thyroid gland are within normal limits. Lungs/Pleura: There are scattered areas of atelectasis or scarring in both lungs, linear. 2 mm right upper lobe nodule image 5/63 is not definitely seen on prior. Trachea and central airways are patent. No pleural effusion or pneumothorax. Musculoskeletal: No chest wall mass or suspicious bone lesions identified. CT ABDOMEN PELVIS FINDINGS Hepatobiliary: No focal liver abnormality is seen. No gallstones, gallbladder wall  thickening, or biliary dilatation. Pancreas: Unremarkable. No pancreatic ductal dilatation or surrounding inflammatory changes. Spleen: Normal in size without focal abnormality. Adrenals/Urinary Tract: There is a 5.6 cm cyst in the superior pole of the right kidney. Additionally, there are smaller cysts in the right kidney. There is no hydronephrosis or perinephric fat stranding. There is a subcentimeter hypodensity in the inferior pole the left kidney which is likely cysts. There is indeterminate left adrenal nodule measuring up to 1.7 cm which is unchanged from the prior examination in 2014. The right adrenal gland is within normal limits. Bladder is within normal limits. Stomach/Bowel: Stomach is within normal limits. Appendix appears normal. No evidence of bowel wall thickening, distention, or inflammatory changes. There is sigmoid colon diverticulosis. Vascular/Lymphatic: Aortic atherosclerosis. No enlarged abdominal or pelvic lymph nodes. Reproductive: Prostate gland is enlarged. Other: There is a right inguinal hernia small in size containing nondilated bowel. There is no free fluid. Musculoskeletal: Degenerative changes affect the spine. No acute fractures are identified. There is a sclerotic density in the right iliac bone measuring up to 15 mm. There severe degenerative changes of the right hip. There some irregularity of the right femoral head which can be seen in the setting of avascular necrosis. IMPRESSION: 1. No acute posttraumatic sequelae in the chest, abdomen or pelvis. 2. 2 mm right solid pulmonary nodule within the upper lobe. No follow-up needed if patient is low-risk.This recommendation follows the consensus statement: Guidelines for Management of Incidental Pulmonary Nodules Detected on CT Images: From the Fleischner Society 2017; Radiology 2017; 284:228-243. 3. Renal cysts measuring up to 5.6 cm.  No follow-up recommended. 4. Sigmoid colon diverticulosis. 5. Right inguinal hernia containing  nondilated bowel. 6. Sclerotic density in the right iliac wing is indeterminate. No other focal osseous lesions are identified. Please correlate clinically. 7. Severe degenerative changes of the right hip with possible avascular necrosis. 8. Stable indeterminate left adrenal nodule, unchanged from 2014. This can be further evaluated with adrenal CT MRI if clinically warranted. Aortic Atherosclerosis (ICD10-I70.0). Electronically Signed   By: Darliss Cheney M.D.   On: 08/18/2023 22:30   DG Pelvis Portable  Result Date: 08/18/2023 CLINICAL DATA:  Trauma. EXAM: PORTABLE PELVIS 1-2 VIEWS COMPARISON:  Abdominal radiograph dated 01/11/2019. FINDINGS: Evaluation is limited due to body habitus. Somewhat angulated appearance of the right femoral neck, suboptimally evaluated on this radiograph. A fracture is not excluded. CT recommended if there is clinical concern for right hip fracture. No other acute fracture. There is no dislocation. Severe arthritic changes of the right hip with flattening of the right femoral head and severe narrowing of the joint space and bone-on-bone contact. There is degenerative changes of the lower lumbar spine. The soft tissues are unremarkable. IMPRESSION: 1. Limited evaluation due to body habitus. 2. Possible  right femoral neck fracture. CT recommended if there is clinical concern for right hip fracture. 3. Severe arthritic changes of the right hip. Electronically Signed   By: Elgie Collard M.D.   On: 08/18/2023 22:07   DG Chest Port 1 View  Result Date: 08/18/2023 CLINICAL DATA:  Unwitnessed fall.  Found on the floor. EXAM: PORTABLE CHEST 1 VIEW COMPARISON:  Chest radiograph dated 01/01/2022. FINDINGS: There is diffuse chronic interstitial coarsening and bronchitic changes. There is however interval development of interstitial reticulonodular densities concerning for developing infiltrate. Clinical correlation is recommended. No consolidative changes. There is no pleural effusion  pneumothorax. The cardiac silhouette is within normal limits. No acute osseous pathology. IMPRESSION: Chronic interstitial changes with possible developing infiltrate. Electronically Signed   By: Elgie Collard M.D.   On: 08/18/2023 22:03    Procedures Procedures    Medications Ordered in ED Medications  fentaNYL (SUBLIMAZE) injection 100 mcg (100 mcg Intravenous Given 08/18/23 2058)  iohexol (OMNIPAQUE) 350 MG/ML injection 75 mL (75 mLs Intravenous Contrast Given 08/18/23 2123)  HYDROmorphone (DILAUDID) injection 1 mg (1 mg Intravenous Given 08/18/23 2141)    ED Course/ Medical Decision Making/ A&P                                 Medical Decision Making Elderly male, on Plavix presents after fall with possible head trauma, possible pain in multiple areas.  Patient is hemodynamically unremarkable, but given the his age, colitides, prior differential including intracranial, thoracic or intra-abdominal injury as well as fractures considered. Patient had multiple doses of pain medication, was monitored for hours and no decompensation, subsequently awake, alert, answering questions appropriately, agreeable with discharge after CT scans which were all reviewed at length, discussed with him more unremarkable for findings.   Amount and/or Complexity of Data Reviewed Independent Historian: EMS Labs: ordered. Decision-making details documented in ED Course. Radiology: ordered and independent interpretation performed. Decision-making details documented in ED Course.  Risk Prescription drug management. Decision regarding hospitalization. Diagnosis or treatment significantly limited by social determinants of health.   11:30 PM Patient sitting upright breathing easily room air 98%, vital signs unremarkable, no distress.        Final Clinical Impression(s) / ED Diagnoses Final diagnoses:  Fall, initial encounter    Rx / DC Orders ED Discharge Orders     None         Gerhard Munch, MD 08/18/23 2330

## 2023-08-18 NOTE — Progress Notes (Signed)
Orthopedic Tech Progress Note Patient Details:  Eddie Graves [birth year] [id number]  Level 2 trauma   Patient ID: Eddie Graves, male   DOB: 1931/08/16, 89 y.o.   MRN: [id number]  Eddie Graves 08/18/2023, 9:25 PM

## 2023-08-19 LAB — I-STAT CHEM 8, ED
BUN: 26 mg/dL — ABNORMAL HIGH (ref 8–23)
Calcium, Ion: 1.2 mmol/L (ref 1.15–1.40)
Chloride: 98 mmol/L (ref 98–111)
Creatinine, Ser: 1.6 mg/dL — ABNORMAL HIGH (ref 0.61–1.24)
Glucose, Bld: 254 mg/dL — ABNORMAL HIGH (ref 70–99)
HCT: 38 % — ABNORMAL LOW (ref 39.0–52.0)
Hemoglobin: 12.9 g/dL — ABNORMAL LOW (ref 13.0–17.0)
Potassium: 4.6 mmol/L (ref 3.5–5.1)
Sodium: 133 mmol/L — ABNORMAL LOW (ref 135–145)
TCO2: 24 mmol/L (ref 22–32)

## 2023-08-19 LAB — I-STAT CG4 LACTIC ACID, ED: Lactic Acid, Venous: 1.5 mmol/L (ref 0.5–1.9)

## 2023-08-19 NOTE — ED Notes (Signed)
Pt unwilling to stay in a gown, and is difficult to redirect, he will not follow directions appropriately and is continually trying to fet out of the bed during is care, he has now have 11 redirections of behaviors he is having that places him at risk for falling or harming himself in an accidental way.

## 2023-08-28 ENCOUNTER — Emergency Department (HOSPITAL_COMMUNITY)

## 2023-08-28 ENCOUNTER — Encounter (HOSPITAL_COMMUNITY): Payer: Self-pay

## 2023-08-28 ENCOUNTER — Other Ambulatory Visit: Payer: Self-pay

## 2023-08-28 ENCOUNTER — Emergency Department (HOSPITAL_COMMUNITY)
Admission: EM | Admit: 2023-08-28 | Discharge: 2023-08-28 | Disposition: A | Discharge status: Home / Self Care | Attending: Emergency Medicine | Admitting: Emergency Medicine

## 2023-08-28 DIAGNOSIS — Z7982 Long term (current) use of aspirin: Secondary | ICD-10-CM | POA: Insufficient documentation

## 2023-08-28 DIAGNOSIS — R519 Headache, unspecified: Secondary | ICD-10-CM | POA: Diagnosis not present

## 2023-08-28 DIAGNOSIS — Z794 Long term (current) use of insulin: Secondary | ICD-10-CM | POA: Insufficient documentation

## 2023-08-28 DIAGNOSIS — K5730 Diverticulosis of large intestine without perforation or abscess without bleeding: Secondary | ICD-10-CM | POA: Insufficient documentation

## 2023-08-28 DIAGNOSIS — Z7902 Long term (current) use of antithrombotics/antiplatelets: Secondary | ICD-10-CM | POA: Insufficient documentation

## 2023-08-28 DIAGNOSIS — M25551 Pain in right hip: Secondary | ICD-10-CM | POA: Insufficient documentation

## 2023-08-28 DIAGNOSIS — N281 Cyst of kidney, acquired: Secondary | ICD-10-CM | POA: Diagnosis not present

## 2023-08-28 DIAGNOSIS — Y92009 Unspecified place in unspecified non-institutional (private) residence as the place of occurrence of the external cause: Secondary | ICD-10-CM | POA: Insufficient documentation

## 2023-08-28 DIAGNOSIS — W19XXXA Unspecified fall, initial encounter: Secondary | ICD-10-CM | POA: Insufficient documentation

## 2023-08-28 DIAGNOSIS — E119 Type 2 diabetes mellitus without complications: Secondary | ICD-10-CM | POA: Diagnosis not present

## 2023-08-28 DIAGNOSIS — M542 Cervicalgia: Secondary | ICD-10-CM | POA: Insufficient documentation

## 2023-08-28 DIAGNOSIS — J439 Emphysema, unspecified: Secondary | ICD-10-CM | POA: Diagnosis not present

## 2023-08-28 DIAGNOSIS — I2510 Atherosclerotic heart disease of native coronary artery without angina pectoris: Secondary | ICD-10-CM | POA: Diagnosis not present

## 2023-08-28 DIAGNOSIS — I10 Essential (primary) hypertension: Secondary | ICD-10-CM | POA: Insufficient documentation

## 2023-08-28 DIAGNOSIS — M25511 Pain in right shoulder: Secondary | ICD-10-CM | POA: Insufficient documentation

## 2023-08-28 DIAGNOSIS — I700 Atherosclerosis of aorta: Secondary | ICD-10-CM | POA: Diagnosis not present

## 2023-08-28 DIAGNOSIS — Z79899 Other long term (current) drug therapy: Secondary | ICD-10-CM | POA: Insufficient documentation

## 2023-08-28 DIAGNOSIS — M160 Bilateral primary osteoarthritis of hip: Secondary | ICD-10-CM | POA: Insufficient documentation

## 2023-08-28 LAB — CBC
HCT: 39.2 % (ref 39.0–52.0)
Hemoglobin: 12.6 g/dL — ABNORMAL LOW (ref 13.0–17.0)
MCH: 29.2 pg (ref 26.0–34.0)
MCHC: 32.1 g/dL (ref 30.0–36.0)
MCV: 91 fL (ref 80.0–100.0)
Platelets: 212 10*3/uL (ref 150–400)
RBC: 4.31 MIL/uL (ref 4.22–5.81)
RDW: 12.8 % (ref 11.5–15.5)
WBC: 9.6 10*3/uL (ref 4.0–10.5)
nRBC: 0 % (ref 0.0–0.2)

## 2023-08-28 LAB — I-STAT CG4 LACTIC ACID, ED: Lactic Acid, Venous: 1.9 mmol/L (ref 0.5–1.9)

## 2023-08-28 LAB — URINALYSIS, ROUTINE W REFLEX MICROSCOPIC
Bacteria, UA: NONE SEEN
Bilirubin Urine: NEGATIVE
Glucose, UA: NEGATIVE mg/dL
Ketones, ur: NEGATIVE mg/dL
Leukocytes,Ua: NEGATIVE
Nitrite: NEGATIVE
Protein, ur: 100 mg/dL — AB
Specific Gravity, Urine: 1.009 (ref 1.005–1.030)
pH: 6 (ref 5.0–8.0)

## 2023-08-28 LAB — COMPREHENSIVE METABOLIC PANEL
ALT: 20 U/L (ref 0–44)
AST: 29 U/L (ref 15–41)
Albumin: 3.6 g/dL (ref 3.5–5.0)
Alkaline Phosphatase: 109 U/L (ref 38–126)
Anion gap: 12 (ref 5–15)
BUN: 16 mg/dL (ref 8–23)
CO2: 20 mmol/L — ABNORMAL LOW (ref 22–32)
Calcium: 9.5 mg/dL (ref 8.9–10.3)
Chloride: 100 mmol/L (ref 98–111)
Creatinine, Ser: 1.52 mg/dL — ABNORMAL HIGH (ref 0.61–1.24)
GFR, Estimated: 43 mL/min — ABNORMAL LOW (ref >60)
Glucose, Bld: 193 mg/dL — ABNORMAL HIGH (ref 70–99)
Potassium: 4.3 mmol/L (ref 3.5–5.1)
Sodium: 132 mmol/L — ABNORMAL LOW (ref 135–145)
Total Bilirubin: 0.6 mg/dL (ref 0.3–1.2)
Total Protein: 7.7 g/dL (ref 6.5–8.1)

## 2023-08-28 LAB — I-STAT CHEM 8, ED
BUN: 19 mg/dL (ref 8–23)
Calcium, Ion: 1.17 mmol/L (ref 1.15–1.40)
Chloride: 101 mmol/L (ref 98–111)
Creatinine, Ser: 1.5 mg/dL — ABNORMAL HIGH (ref 0.61–1.24)
Glucose, Bld: 193 mg/dL — ABNORMAL HIGH (ref 70–99)
HCT: 39 % (ref 39.0–52.0)
Hemoglobin: 13.3 g/dL (ref 13.0–17.0)
Potassium: 4.4 mmol/L (ref 3.5–5.1)
Sodium: 136 mmol/L (ref 135–145)
TCO2: 23 mmol/L (ref 22–32)

## 2023-08-28 MED ORDER — HYDROMORPHONE HCL 1 MG/ML IJ SOLN
0.2500 mg | Freq: Once | INTRAMUSCULAR | Status: AC
  Administered 2023-08-28: 0.25 mg via INTRAVENOUS
  Filled 2023-08-28: qty 1

## 2023-08-28 MED ORDER — OXYCODONE-ACETAMINOPHEN 5-325 MG PO TABS
1.0000 | ORAL_TABLET | Freq: Four times a day (QID) | ORAL | 0 refills | Status: DC | PRN
Start: 2023-08-28 — End: 2023-09-20

## 2023-08-28 MED ORDER — OXYCODONE-ACETAMINOPHEN 5-325 MG PO TABS
1.0000 | ORAL_TABLET | Freq: Once | ORAL | Status: AC
  Administered 2023-08-28: 1 via ORAL
  Filled 2023-08-28: qty 1

## 2023-08-28 MED ORDER — FENTANYL CITRATE PF 50 MCG/ML IJ SOSY
50.0000 ug | PREFILLED_SYRINGE | Freq: Once | INTRAMUSCULAR | Status: AC
  Administered 2023-08-28: 50 ug via INTRAVENOUS
  Filled 2023-08-28: qty 1

## 2023-08-28 MED ORDER — IOHEXOL 350 MG/ML SOLN
60.0000 mL | Freq: Once | INTRAVENOUS | Status: AC | PRN
  Administered 2023-08-28: 60 mL via INTRAVENOUS

## 2023-08-28 MED ORDER — IOHEXOL 350 MG/ML SOLN
50.0000 mL | Freq: Once | INTRAVENOUS | Status: AC | PRN
  Administered 2023-08-28: 50 mL via INTRAVENOUS

## 2023-08-28 NOTE — ED Provider Notes (Signed)
[place] EMERGENCY DEPARTMENT AT [facility] Provider Note   CSN: [id number] Arrival date & time: 08/28/23  0815     History  Chief Complaint  Patient presents with   Eddie Graves is a 89 y.o. male with a past medical history of diabetes, hypertension, blindness presents today for evaluation after fall.  Patient with a fall yesterday at home.  Per family patient's mental status is at baseline.  He complains of right shoulder, right hip and neck pain.  Unsure if patient hit his head.  She is taking Plavix.  He denies nausea, vomiting, chest pain, shortness of breath, abdominal pain, back pain, bowel changes, urinary symptoms, rash.   Fall    Past Medical History:  Diagnosis Date   Anxiety state, unspecified    Blind    Chronic airway obstruction, not elsewhere classified    Coronary atherosclerosis of native coronary artery    Cough    Depressive disorder, not elsewhere classified    DM (diabetes mellitus) type II controlled peripheral vascular disorder    Essential hypertension, benign    H/O hiatal hernia    Headache(784.0)    Legal blindness, as defined in [place]    Memory loss    Other and unspecified hyperlipidemia    Pain in joint, site unspecified    Palpitations    Rash and other nonspecific skin eruption    Reflux esophagitis    Sebaceous cyst    Spinal stenosis, lumbar region, with neurogenic claudication    Type II diabetes mellitus with neurological manifestations, uncontrolled    Unspecified constipation    Vitamin D deficiency    Past Surgical History:  Procedure Laterality Date   CARDIAC CATHETERIZATION N/A 12/15/2016   Procedure: Left Heart Cath and Coronary Angiography;  Surgeon: Rinaldo Cloud, MD;  Location: [facility] IN[facility]AS[facility] LAB;  Service: Cardiovascular;  Laterality: N/A;   CORONARY ANGIOPLASTY WITH STENT PLACEMENT     EYE SURGERY     right prosthetic globe   HERNIA REPAIR       Home Medications Prior to Admission medications    Medication Sig Start Date End Date Taking? Authorizing Provider  amLODipine (NORVASC) 10 MG tablet Take 5 mg by mouth daily.  01/09/19   Rinaldo Cloud, MD  aspirin EC 81 MG tablet 1/2 tablet by mouth daily per Cardiologist 11/05/16   Renato Gails, Tiffany L, DO  Cholecalciferol (VITAMIN D3) 2000 units capsule TAKE 1 CAPSULE (2,000 UNITS TOTAL) BY MOUTH DAILY. FOR VITAMIN DAY REPLETION Patient taking differently: Take 2,000 Units by mouth daily.  01/17/18   Reed, Tiffany L, DO  clopidogrel (PLAVIX) 75 MG tablet Take 1 tablet (75 mg total) by mouth daily with breakfast. 12/17/16   Rinaldo Cloud, MD  CRESTOR 20 MG tablet Take 20 mg by mouth at bedtime. For cholesterol 08/13/14   [provider]  diclofenac Sodium (VOLTAREN) 1 % GEL Apply 1 application topically 4 (four) times daily. 09/24/20   [provider]  doxycycline (VIBRAMYCIN) 100 MG capsule Take 1 capsule (100 mg total) by mouth 2 (two) times daily. Patient not taking: Reported on 09/30/2020 01/31/19   Gilda Crease, MD  esomeprazole (NEXIUM) 40 MG capsule TAKE ONE CAPSULE BY MOUTH ONCE DAILY BEFORE BREAKFAST FOR STOMACH Patient taking differently: Take 40 mg by mouth daily.  01/17/18   Reed, Tiffany L, DO  glucose blood (PRODIGY NO CODING BLOOD GLUC) test strip PRODIGY TEST STRIPS, CHECK BLOOD SUGAR TWICE DAILY DX: E11.65, H54.7  PATIENT WITH BLINDNESS AND NEEDS STRIPS WITH READ ALOUD MONITOR 03/08/18   Reed, Tiffany L, DO  Insulin Lispro Prot & Lispro ([name] 75/25 KWIKPEN) (75-25) 100 UNIT/ML Kwikpen Inject 35 Units into the skin 2 (two) times daily with a meal. 10/02/20   Dahal, Melina Schools, MD  levalbuterol (XOPENEX) 1.25 MG/3ML nebulizer solution TAKE 1.25 MG (ONE AMPULE) BY NEBULIZATION EVERY 4 (FOUR) HOURS AS NEEDED FOR WHEEZING. Patient taking differently: Take 1.25 mg by nebulization every 4 (four) hours as needed for wheezing or shortness of breath.  01/17/18   Reed, Tiffany L, DO  lisinopril (PRINIVIL,ZESTRIL) 20 MG  tablet TAKE 1 TABLET (20 MG TOTAL) BY MOUTH DAILY Patient taking differently: Take 20 mg by mouth daily.  01/17/18   Reed, Tiffany L, DO  metoprolol succinate (TOPROL-XL) 100 MG 24 hr tablet Take one tablet by mouth once daily for blood pressure 06/06/15   Reed, Tiffany L, DO  mometasone-formoterol (DULERA) 200-5 MCG/ACT AERO Inhale 2 puffs into the lungs 2 (two) times daily. 12/16/16   Rinaldo Cloud, MD  nitroGLYCERIN (NITRODUR - DOSED IN MG/24 HR) 0.4 mg/hr patch Apply 1 patch to the skin daily 01/30/19   [provider]  nitroGLYCERIN (NITROSTAT) 0.4 MG SL tablet Place 1 tablet (0.4 mg total) under the tongue every 5 (five) minutes as needed for chest pain. 12/16/16   Rinaldo Cloud, MD  Omega 3 1200 MG CAPS Take 2,400 mg by mouth daily. For cholesterol    [provider]  PROAIR HFA 108 (90 Base) MCG/ACT inhaler INHALE TWO PUFFS INTO THE LUNGS EVERY TWO HOURS IF NEEDED Patient not taking: Reported on 09/30/2020 01/17/18   Reed, Tiffany L, DO  sertraline (ZOLOFT) 50 MG tablet TAKE ONE TABLET BY MOUTH ONCE DAILY 01/17/18   Reed, Tiffany L, DO  tiotropium (SPIRIVA) 18 MCG inhalation capsule Place 1 capsule (18 mcg total) into inhaler and inhale daily. 12/17/16   Rinaldo Cloud, MD  TRADJENTA 5 MG TABS tablet TAKE ONE TABLET BY MOUTH ONCE DAILY TO CONTROL BLOOD SUGAR Patient not taking: Reported on 09/30/2020 01/17/18   Bufford Spikes L, DO      Allergies    Flexeril [cyclobenzaprine] and Nsaids    Review of Systems   Review of Systems Negative except as per HPI.  Physical Exam Updated Vital Signs BP (!) 164/76   Pulse 87   Temp 98 F (36.7 C) (Oral)   Resp 14   Ht 5\' 6"  (1.676 m)   Wt 72.6 kg   SpO2 95%   BMI 25.82 kg/m  Physical Exam Vitals and nursing note reviewed.  Constitutional:      Appearance: Normal appearance.  HENT:     Head: Normocephalic and atraumatic.     Mouth/Throat:     Mouth: Mucous membranes are moist.  Eyes:     General: No scleral  icterus. Cardiovascular:     Rate and Rhythm: Normal rate and regular rhythm.     Pulses: Normal pulses.     Heart sounds: Normal heart sounds.  Pulmonary:     Effort: Pulmonary effort is normal.     Breath sounds: Normal breath sounds.  Abdominal:     General: Abdomen is flat.     Palpations: Abdomen is soft.     Tenderness: There is no abdominal tenderness.  Musculoskeletal:        General: No deformity.     Comments: Tenderness to palpation to right shoulder, right hip, right thigh, right knee and paraspinal muscles of  the cervical spine.  Skin:    General: Skin is warm.     Findings: No rash.  Neurological:     General: No focal deficit present.     Mental Status: He is alert.  Psychiatric:        Mood and Affect: Mood normal.     ED Results / Procedures / Treatments   Labs (all labs ordered are listed, but only abnormal results are displayed) Labs Reviewed  I-STAT CHEM 8, ED - Abnormal; Notable for the following components:      Result Value   Creatinine, Ser 1.50 (*)    Glucose, Bld 193 (*)    All other components within normal limits  COMPREHENSIVE METABOLIC PANEL  CBC  ETHANOL  URINALYSIS, ROUTINE W REFLEX MICROSCOPIC  PROTIME-INR  I-STAT CG4 LACTIC ACID, ED    EKG None  Radiology No results found.  Procedures Procedures    Medications Ordered in ED Medications  fentaNYL (SUBLIMAZE) injection 50 mcg (has no administration in time range)    ED Course/ Medical Decision Making/ A&P                                 Medical Decision Making Amount and/or Complexity of Data Reviewed Labs: ordered. Radiology: ordered.  Risk Prescription drug management.   This patient presents to the ED for evaluation after fall, this involves an extensive number of treatment options, and is a complaint that carries with a high risk of complications and morbidity.  The differential diagnosis includes fracture, dislocation, head bleed.  This is not an exhaustive  list.  Lab tests: I ordered and personally interpreted labs.  The pertinent results include: WBC unremarkable. Hbg unremarkable. Platelets unremarkable. Electrolytes unremarkable. BUN unremarkable, creatinine 1.5 about baseline.  Lactic acid normal.  UA is unremarkable.  Imaging studies: I ordered imaging studies, personally reviewed, interpreted imaging and agree with the radiologist's interpretations. The results include: CT head, cervical spine, chest abdomen pelvis with no acute finding.  X-ray of chest, shoulder, knee, femur, pelvis with no acute osseous abnormalities.  Problem list/ ED course/ Critical interventions/ Medical management: HPI: See above Vital signs within normal range and stable throughout visit. Laboratory/imaging studies significant for: See above. On physical examination, patient is afebrile and appears in no acute distress.  Labs are nonrevealing.  Imaging including x-rays and CTs are unremarkable with no acute findings.  Given Percocet and Dilaudid and fentanyl for pain.  Reevaluation of patient after these medications showed that the patient improved.  Patient was monitored for hours and no decompensation.  He is awake, alert and answer questions appropriately.  Patient is stable for discharge.  Advised patient to follow-up with his primary care physician for further evaluation and management.  Strict ED return precaution discussed. I have reviewed the patient home medicines and have made adjustments as needed.  Cardiac monitoring/EKG: The patient was maintained on a cardiac monitor.  I personally reviewed and interpreted the cardiac monitor which showed an underlying rhythm of: sinus rhythm.  Additional history obtained: External records from outside source obtained and reviewed including: Chart review including previous notes, labs, imaging.  Consultations obtained:  Disposition Continued outpatient therapy. Follow-up with PCP recommended for reevaluation of  symptoms. Treatment plan discussed with patient.  Pt acknowledged understanding was agreeable to the plan. Worrisome signs and symptoms were discussed with patient, and patient acknowledged understanding to return to the ED if they noticed these signs  and symptoms. Patient was stable upon discharge.   This chart was dictated using voice recognition software.  Despite best efforts to proofread,  errors can occur which can change the documentation meaning.          Final Clinical Impression(s) / ED Diagnoses Final diagnoses:  Fall, initial encounter    Rx / DC Orders ED Discharge Orders     None         Jeanelle Malling, [place] 08/28/23 1757    Loetta Rough, MD 09/01/23 1134

## 2023-08-28 NOTE — ED Notes (Addendum)
While undressing pt for assessment, RN and NT found several bed bugs. Pt deconed at this time. Belongings double bagged and a sample of bug is at bedside. RN and NT cleaned pt.

## 2023-08-28 NOTE — Discharge Instructions (Addendum)
Your imaging including CTs and x-rays were reassuring today. Please take tylenol/ibuprofen/naproxen or Percocet as needed for pain. I recommend close follow-up with PCP for reevaluation.  Please do not hesitate to return to emergency department if worrisome signs symptoms we discussed become apparent.

## 2023-08-28 NOTE — ED Triage Notes (Addendum)
Pt BIB EMS due to a fall from home sometimes last night. Pt was unwitnessed. Pt has dementia. Per family, pt is at baseline. Pt C/O right hip. Pt has shortening to right leg. Pt has pulse, motor and sensory in right leg. Pt arrives in c-collar. Pt is on plavix,unsure if pt hit head.

## 2023-08-28 NOTE — ED Notes (Signed)
Pt saturated in urine, RN and NT cleaned pt. New chucks/sheets/ and diaper applied. New condom cath applied as well.

## 2023-09-02 ENCOUNTER — Ambulatory Visit: Payer: Medicare Other | Admitting: Podiatry

## 2023-09-02 DIAGNOSIS — M79674 Pain in right toe(s): Secondary | ICD-10-CM | POA: Diagnosis not present

## 2023-09-02 DIAGNOSIS — E1142 Type 2 diabetes mellitus with diabetic polyneuropathy: Secondary | ICD-10-CM

## 2023-09-02 DIAGNOSIS — B351 Tinea unguium: Secondary | ICD-10-CM | POA: Diagnosis not present

## 2023-09-02 DIAGNOSIS — M79675 Pain in left toe(s): Secondary | ICD-10-CM | POA: Diagnosis not present

## 2023-09-02 DIAGNOSIS — R600 Localized edema: Secondary | ICD-10-CM

## 2023-09-02 NOTE — Progress Notes (Signed)
Subjective:  Patient ID: Eddie Graves, male    DOB: 1931-02-01,  MRN: [id number]  Chief Complaint  Patient presents with   Nail Problem    Diabetic Foot Care-nail trim    Foot Swelling    Bilateral foot swelling. Complaining of throbbing pain from the right hip to the foot. Does not wear any compression socks at this time.     89 y.o. male presents with the above complaint. History confirmed with patient. Patient presenting with pain related to dystrophic thickened elongated nails. Patient is unable to trim own nails related to nail dystrophy and/or mobility issues. Patient does  have a history of T2DM.  Bilateral lower extremity edema noted  Objective:  Physical Exam: warm, good capillary refill nail exam onychomycosis of the toenails, onycholysis, and dystrophic nails DP pulses palpable, PT pulses palpable, and protective sensation absent Left Foot:  Pain with palpation of nails due to elongation and dystrophic growth.  Right Foot: Pain with palpation of nails due to elongation and dystrophic growth.   Assessment:   1. Pain due to onychomycosis of toenails of both feet   2. Edema of both lower extremities   3. DM type 2 with diabetic peripheral neuropathy (HCC)      Plan:  Patient was evaluated and treated and all questions answered.  # Bilateral lower extremity edema -Recommend compression therapy  # DM2 with neuropathy Patient educated on diabetes. Discussed proper diabetic foot care and discussed risks and complications of disease. Educated patient in depth on reasons to return to the office immediately should he/she discover anything concerning or new on the feet. All questions answered. Discussed proper shoes as well.    #Onychomycosis with pain  -Nails palliatively debrided as below. -Educated on self-care  Procedure: Nail Debridement Rationale: Pain Type of Debridement: manual, sharp debridement. Instrumentation: Nail nipper, rotary burr. Number of Nails:  10  Return in about 3 months (around 12/02/2023) for [facility].         Corinna Gab, DPM [facility]

## 2023-09-19 ENCOUNTER — Observation Stay (HOSPITAL_COMMUNITY)
Admission: EM | Admit: 2023-09-19 | Discharge: 2023-09-20 | Disposition: A | Discharge status: Home / Self Care | Payer: Medicare Other | Attending: Internal Medicine | Admitting: Internal Medicine

## 2023-09-19 ENCOUNTER — Encounter (HOSPITAL_COMMUNITY): Payer: Self-pay

## 2023-09-19 ENCOUNTER — Other Ambulatory Visit: Payer: Self-pay

## 2023-09-19 ENCOUNTER — Emergency Department (HOSPITAL_COMMUNITY): Payer: Medicare Other

## 2023-09-19 DIAGNOSIS — Z79899 Other long term (current) drug therapy: Secondary | ICD-10-CM | POA: Diagnosis not present

## 2023-09-19 DIAGNOSIS — R413 Other amnesia: Secondary | ICD-10-CM

## 2023-09-19 DIAGNOSIS — E1142 Type 2 diabetes mellitus with diabetic polyneuropathy: Secondary | ICD-10-CM | POA: Diagnosis present

## 2023-09-19 DIAGNOSIS — Z7902 Long term (current) use of antithrombotics/antiplatelets: Secondary | ICD-10-CM | POA: Insufficient documentation

## 2023-09-19 DIAGNOSIS — T59811A Toxic effect of smoke, accidental (unintentional), initial encounter: Principal | ICD-10-CM

## 2023-09-19 DIAGNOSIS — I2510 Atherosclerotic heart disease of native coronary artery without angina pectoris: Secondary | ICD-10-CM | POA: Diagnosis not present

## 2023-09-19 DIAGNOSIS — J410 Simple chronic bronchitis: Secondary | ICD-10-CM

## 2023-09-19 DIAGNOSIS — Z7982 Long term (current) use of aspirin: Secondary | ICD-10-CM | POA: Insufficient documentation

## 2023-09-19 DIAGNOSIS — J449 Chronic obstructive pulmonary disease, unspecified: Secondary | ICD-10-CM | POA: Diagnosis present

## 2023-09-19 DIAGNOSIS — E119 Type 2 diabetes mellitus without complications: Secondary | ICD-10-CM

## 2023-09-19 DIAGNOSIS — N1831 Chronic kidney disease, stage 3a: Secondary | ICD-10-CM | POA: Insufficient documentation

## 2023-09-19 DIAGNOSIS — F17210 Nicotine dependence, cigarettes, uncomplicated: Secondary | ICD-10-CM | POA: Insufficient documentation

## 2023-09-19 DIAGNOSIS — I5032 Chronic diastolic (congestive) heart failure: Secondary | ICD-10-CM | POA: Diagnosis not present

## 2023-09-19 DIAGNOSIS — E1169 Type 2 diabetes mellitus with other specified complication: Secondary | ICD-10-CM

## 2023-09-19 DIAGNOSIS — I5030 Unspecified diastolic (congestive) heart failure: Secondary | ICD-10-CM | POA: Diagnosis present

## 2023-09-19 DIAGNOSIS — J705 Respiratory conditions due to smoke inhalation: Secondary | ICD-10-CM | POA: Diagnosis present

## 2023-09-19 DIAGNOSIS — I10 Essential (primary) hypertension: Secondary | ICD-10-CM

## 2023-09-19 DIAGNOSIS — H543 Unqualified visual loss, both eyes: Secondary | ICD-10-CM | POA: Diagnosis not present

## 2023-09-19 DIAGNOSIS — E1122 Type 2 diabetes mellitus with diabetic chronic kidney disease: Secondary | ICD-10-CM | POA: Diagnosis not present

## 2023-09-19 DIAGNOSIS — Z794 Long term (current) use of insulin: Secondary | ICD-10-CM

## 2023-09-19 DIAGNOSIS — I130 Hypertensive heart and chronic kidney disease with heart failure and stage 1 through stage 4 chronic kidney disease, or unspecified chronic kidney disease: Secondary | ICD-10-CM | POA: Diagnosis not present

## 2023-09-19 DIAGNOSIS — N183 Chronic kidney disease, stage 3 unspecified: Secondary | ICD-10-CM

## 2023-09-19 DIAGNOSIS — H547 Unspecified visual loss: Secondary | ICD-10-CM

## 2023-09-19 DIAGNOSIS — F32A Depression, unspecified: Secondary | ICD-10-CM

## 2023-09-19 DIAGNOSIS — K219 Gastro-esophageal reflux disease without esophagitis: Secondary | ICD-10-CM

## 2023-09-19 DIAGNOSIS — I25110 Atherosclerotic heart disease of native coronary artery with unstable angina pectoris: Secondary | ICD-10-CM

## 2023-09-19 LAB — BASIC METABOLIC PANEL
Anion gap: 15 (ref 5–15)
BUN: 11 mg/dL (ref 8–23)
CO2: 25 mmol/L (ref 22–32)
Calcium: 9.7 mg/dL (ref 8.9–10.3)
Chloride: 95 mmol/L — ABNORMAL LOW (ref 98–111)
Creatinine, Ser: 1.36 mg/dL — ABNORMAL HIGH (ref 0.61–1.24)
GFR, Estimated: 49 mL/min — ABNORMAL LOW (ref >60)
Glucose, Bld: 205 mg/dL — ABNORMAL HIGH (ref 70–99)
Potassium: 3.9 mmol/L (ref 3.5–5.1)
Sodium: 135 mmol/L (ref 135–145)

## 2023-09-19 LAB — CBC
HCT: 38.3 % — ABNORMAL LOW (ref 39.0–52.0)
Hemoglobin: 12.1 g/dL — ABNORMAL LOW (ref 13.0–17.0)
MCH: 27.5 pg (ref 26.0–34.0)
MCHC: 31.6 g/dL (ref 30.0–36.0)
MCV: 87 fL (ref 80.0–100.0)
Platelets: 288 10*3/uL (ref 150–400)
RBC: 4.4 MIL/uL (ref 4.22–5.81)
RDW: 12.4 % (ref 11.5–15.5)
WBC: 8.5 10*3/uL (ref 4.0–10.5)
nRBC: 0 % (ref 0.0–0.2)

## 2023-09-19 LAB — GLUCOSE, CAPILLARY: Glucose-Capillary: 252 mg/dL — ABNORMAL HIGH (ref 70–99)

## 2023-09-19 LAB — BRAIN NATRIURETIC PEPTIDE: B Natriuretic Peptide: 39.8 pg/mL (ref 0.0–100.0)

## 2023-09-19 MED ORDER — IPRATROPIUM-ALBUTEROL 0.5-2.5 (3) MG/3ML IN SOLN
3.0000 mL | Freq: Once | RESPIRATORY_TRACT | Status: AC
  Administered 2023-09-19: 3 mL via RESPIRATORY_TRACT
  Filled 2023-09-19: qty 3

## 2023-09-19 MED ORDER — SODIUM CHLORIDE 0.9 % IN NEBU
3.0000 mL | INHALATION_SOLUTION | RESPIRATORY_TRACT | Status: DC
  Administered 2023-09-19 – 2023-09-20 (×3): 3 mL via RESPIRATORY_TRACT
  Filled 2023-09-19 (×9): qty 3

## 2023-09-19 MED ORDER — ROSUVASTATIN CALCIUM 20 MG PO TABS: 20.0000 mg | ORAL_TABLET | Freq: Every day | ORAL | Status: DC

## 2023-09-19 MED ORDER — ALBUTEROL SULFATE (2.5 MG/3ML) 0.083% IN NEBU: 2.5000 mg | INHALATION_SOLUTION | RESPIRATORY_TRACT | Status: DC | PRN

## 2023-09-19 MED ORDER — MOMETASONE FURO-FORMOTEROL FUM 200-5 MCG/ACT IN AERO: 2.0000 | INHALATION_SPRAY | Freq: Two times a day (BID) | RESPIRATORY_TRACT | Status: DC

## 2023-09-19 MED ORDER — MOMETASONE FURO-FORMOTEROL FUM 200-5 MCG/ACT IN AERO
2.0000 | INHALATION_SPRAY | Freq: Two times a day (BID) | RESPIRATORY_TRACT | Status: DC
  Administered 2023-09-19 – 2023-09-20 (×2): 2 via RESPIRATORY_TRACT
  Filled 2023-09-19 (×2): qty 8.8

## 2023-09-19 MED ORDER — CLOPIDOGREL BISULFATE 75 MG PO TABS
75.0000 mg | ORAL_TABLET | Freq: Every day | ORAL | Status: DC
  Administered 2023-09-20: 75 mg via ORAL
  Filled 2023-09-19: qty 1

## 2023-09-19 MED ORDER — ACETAMINOPHEN 325 MG PO TABS: 650.0000 mg | ORAL_TABLET | Freq: Four times a day (QID) | ORAL | Status: DC | PRN

## 2023-09-19 MED ORDER — PREDNISONE 5 MG PO TABS
50.0000 mg | ORAL_TABLET | Freq: Once | ORAL | Status: AC
  Administered 2023-09-19: 50 mg via ORAL
  Filled 2023-09-19: qty 2

## 2023-09-19 MED ORDER — POLYETHYLENE GLYCOL 3350 17 G PO PACK: 17.0000 g | PACK | Freq: Every day | ORAL | Status: DC | PRN

## 2023-09-19 MED ORDER — IPRATROPIUM-ALBUTEROL 0.5-2.5 (3) MG/3ML IN SOLN
3.0000 mL | RESPIRATORY_TRACT | Status: DC
  Administered 2023-09-19 – 2023-09-20 (×4): 3 mL via RESPIRATORY_TRACT
  Filled 2023-09-19 (×6): qty 3

## 2023-09-19 MED ORDER — LISINOPRIL 20 MG PO TABS
20.0000 mg | ORAL_TABLET | Freq: Every day | ORAL | Status: DC
  Administered 2023-09-20: 20 mg via ORAL
  Filled 2023-09-19: qty 1

## 2023-09-19 MED ORDER — AMLODIPINE BESYLATE 5 MG PO TABS
5.0000 mg | ORAL_TABLET | Freq: Every day | ORAL | Status: DC
  Administered 2023-09-20: 5 mg via ORAL
  Filled 2023-09-19: qty 1

## 2023-09-19 MED ORDER — INSULIN ASPART 100 UNIT/ML IJ SOLN
0.0000 [IU] | Freq: Three times a day (TID) | INTRAMUSCULAR | Status: DC
  Administered 2023-09-20: 3 [IU] via SUBCUTANEOUS

## 2023-09-19 MED ORDER — SODIUM CHLORIDE 0.9 % IN NEBU
3.0000 mL | INHALATION_SOLUTION | RESPIRATORY_TRACT | Status: DC
  Administered 2023-09-19: 3 mL via RESPIRATORY_TRACT
  Filled 2023-09-19 (×3): qty 3

## 2023-09-19 MED ORDER — ENOXAPARIN SODIUM 40 MG/0.4ML IJ SOSY
40.0000 mg | PREFILLED_SYRINGE | INTRAMUSCULAR | Status: DC
  Administered 2023-09-19: 40 mg via SUBCUTANEOUS
  Filled 2023-09-19: qty 0.4

## 2023-09-19 MED ORDER — PANTOPRAZOLE SODIUM 40 MG PO TBEC
40.0000 mg | DELAYED_RELEASE_TABLET | Freq: Every day | ORAL | Status: DC
  Administered 2023-09-20: 40 mg via ORAL
  Filled 2023-09-19: qty 1

## 2023-09-19 MED ORDER — INSULIN ASPART 100 UNIT/ML IJ SOLN
0.0000 [IU] | Freq: Every day | INTRAMUSCULAR | Status: DC
  Administered 2023-09-19: 3 [IU] via SUBCUTANEOUS

## 2023-09-19 MED ORDER — METOPROLOL SUCCINATE ER 25 MG PO TB24
100.0000 mg | ORAL_TABLET | Freq: Every day | ORAL | Status: DC
  Administered 2023-09-20: 100 mg via ORAL
  Filled 2023-09-19: qty 4

## 2023-09-19 MED ORDER — ACETAMINOPHEN 650 MG RE SUPP: 650.0000 mg | Freq: Four times a day (QID) | RECTAL | Status: DC | PRN

## 2023-09-19 MED ORDER — SODIUM CHLORIDE 0.9% FLUSH
3.0000 mL | Freq: Two times a day (BID) | INTRAVENOUS | Status: DC
  Administered 2023-09-19 – 2023-09-20 (×2): 3 mL via INTRAVENOUS

## 2023-09-19 MED ORDER — DONEPEZIL HCL 5 MG PO TABS
5.0000 mg | ORAL_TABLET | Freq: Every day | ORAL | Status: DC
  Administered 2023-09-20: 5 mg via ORAL
  Filled 2023-09-19: qty 1

## 2023-09-19 MED ORDER — ASPIRIN 81 MG PO TBEC
81.0000 mg | DELAYED_RELEASE_TABLET | Freq: Every day | ORAL | Status: DC
  Administered 2023-09-20: 81 mg via ORAL
  Filled 2023-09-19: qty 1

## 2023-09-19 MED ORDER — SERTRALINE HCL 50 MG PO TABS
50.0000 mg | ORAL_TABLET | Freq: Every day | ORAL | Status: DC
  Administered 2023-09-20: 50 mg via ORAL
  Filled 2023-09-19: qty 1

## 2023-09-19 MED ORDER — TIOTROPIUM BROMIDE MONOHYDRATE 18 MCG IN CAPS
18.0000 ug | ORAL_CAPSULE | Freq: Every day | RESPIRATORY_TRACT | Status: DC
  Filled 2023-09-19: qty 5

## 2023-09-19 MED ORDER — ACETAMINOPHEN-CODEINE 300-30 MG PO TABS: 1.0000 | ORAL_TABLET | ORAL | Status: DC | PRN

## 2023-09-19 MED ORDER — UMECLIDINIUM BROMIDE 62.5 MCG/ACT IN AEPB
1.0000 | INHALATION_SPRAY | Freq: Every day | RESPIRATORY_TRACT | Status: DC
  Administered 2023-09-20: 1 via RESPIRATORY_TRACT
  Filled 2023-09-19: qty 7

## 2023-09-19 NOTE — ED Notes (Signed)
Pt removed his IV. Refuses to let this RN place another
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
## 2023-09-20 DIAGNOSIS — N1831 Chronic kidney disease, stage 3a: Secondary | ICD-10-CM

## 2023-09-20 DIAGNOSIS — K219 Gastro-esophageal reflux disease without esophagitis: Secondary | ICD-10-CM | POA: Diagnosis not present

## 2023-09-20 DIAGNOSIS — T59811A Toxic effect of smoke, accidental (unintentional), initial encounter: Secondary | ICD-10-CM | POA: Diagnosis not present

## 2023-09-20 DIAGNOSIS — R413 Other amnesia: Secondary | ICD-10-CM | POA: Diagnosis not present

## 2023-09-20 LAB — COMPREHENSIVE METABOLIC PANEL
ALT: 20 U/L (ref 0–44)
AST: 23 U/L (ref 15–41)
Albumin: 3.2 g/dL — ABNORMAL LOW (ref 3.5–5.0)
Alkaline Phosphatase: 134 U/L — ABNORMAL HIGH (ref 38–126)
Anion gap: 12 (ref 5–15)
BUN: 11 mg/dL (ref 8–23)
CO2: 25 mmol/L (ref 22–32)
Calcium: 9.8 mg/dL (ref 8.9–10.3)
Chloride: 99 mmol/L (ref 98–111)
Creatinine, Ser: 1.26 mg/dL — ABNORMAL HIGH (ref 0.61–1.24)
GFR, Estimated: 54 mL/min — ABNORMAL LOW (ref >60)
Glucose, Bld: 187 mg/dL — ABNORMAL HIGH (ref 70–99)
Potassium: 4.1 mmol/L (ref 3.5–5.1)
Sodium: 136 mmol/L (ref 135–145)
Total Bilirubin: 0.2 mg/dL — ABNORMAL LOW (ref 0.3–1.2)
Total Protein: 7.7 g/dL (ref 6.5–8.1)

## 2023-09-20 LAB — CBC
HCT: 38.1 % — ABNORMAL LOW (ref 39.0–52.0)
Hemoglobin: 12.6 g/dL — ABNORMAL LOW (ref 13.0–17.0)
MCH: 28.5 pg (ref 26.0–34.0)
MCHC: 33.1 g/dL (ref 30.0–36.0)
MCV: 86.2 fL (ref 80.0–100.0)
Platelets: 305 10*3/uL (ref 150–400)
RBC: 4.42 MIL/uL (ref 4.22–5.81)
RDW: 12.3 % (ref 11.5–15.5)
WBC: 10.1 10*3/uL (ref 4.0–10.5)
nRBC: 0 % (ref 0.0–0.2)

## 2023-09-20 LAB — GLUCOSE, CAPILLARY: Glucose-Capillary: 161 mg/dL — ABNORMAL HIGH (ref 70–99)

## 2023-09-20 NOTE — TOC Transition Note (Signed)
Transition of Care [facility]) - CM/SW Discharge Note   Patient Details  Name: Eddie Graves MRN: [id number] Date of Birth: Apr 19, 1931  [facility] CM/SW Contact:  Gordy Clement, RN Phone Number: 09/20/2023, 9:57 AM   Clinical Narrative:     Patient to dc to home with Family- No TOC needs              Patient Goals and CMS Choice      Discharge Placement                         Discharge Plan and Services Additional resources added to the After Visit Summary for                                       Social Determinants of Health (SDOH) Interventions SDOH Screenings   Depression (PHQ2-9): Low Risk  (02/21/2019)  Tobacco Use: High Risk (09/19/2023)     Readmission Risk Interventions     No data to display
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
## 2023-09-20 NOTE — TOC CAGE-AID Note (Signed)
Transition of Care [facility]) - CAGE-AID Screening   Patient Details  Name: Eddie Graves MRN: [id number] Date of Birth: October 09, 1931  Transition of [facility]) CM/SW Contact:    Katha Hamming, RN Phone Number: 09/20/2023, 8:57 AM   Clinical Narrative: Chart review with no hx alcohol or drug use   CAGE-AID Screening:    Have You Ever Felt You Ought to Cut Down on Your Drinking or Drug Use?: No Have People Annoyed You By Critizing Your Drinking Or Drug Use?: No Have You Felt Bad Or Guilty About Your Drinking Or Drug Use?: No Have You Ever Had a Drink or Used Drugs First Thing In The Morning to Steady Your Nerves or to Get Rid of a Hangover?: No CAGE-AID Score: 0  Substance Abuse Education Offered: No

## 2023-09-20 NOTE — Discharge Summary (Addendum)
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 Creatinine Clearance: 35.2 mL/min (A) (by C-G formula based on SCr of 1.26 mg/dL (H)). No results for input(s): "LIPASE", "AMYLASE" in the last 72 hours. No results for input(s): "CKTOTAL", "CKMB", "CKMBINDEX", "TROPONINI" in the last 72 hours. Invalid input(s): "POCBNP" No results for input(s): "DDIMER" in the last 72 hours. No results for input(s): "HGBA1C" in the last 72 hours. No results for input(s): "CHOL", "HDL", "LDLCALC", "TRIG", "CHOLHDL", "LDLDIRECT" in the last 72 hours. No results for input(s): "TSH", "T4TOTAL", "T3FREE", "THYROIDAB" in the last 72 hours.  Invalid input(s): "FREET3" No results for input(s): "VITAMINB12", "FOLATE", "FERRITIN", "TIBC", "IRON", "RETICCTPCT" in the last 72 hours. Coags: No results for input(s): "INR" in the last 72 hours.  Invalid input(s): "PT" Microbiology: No results found for this or any previous visit (from the past 240 hour(s)).  FURTHER DISCHARGE  INSTRUCTIONS:  Get Medicines reviewed and adjusted: Please take all your medications with you for your next visit with your Primary MD  Laboratory/radiological data: Please request your Primary MD to go over all hospital tests and procedure/radiological results at the follow up, please ask your Primary MD to get all Hospital records sent to his/her office.  In some cases, they will be blood work, cultures and biopsy results pending at the time of your discharge. Please request that your primary care M.D. goes through all the records of your hospital data and follows up on these results.  Also Note the following: If you experience worsening of your admission symptoms, develop  shortness of breath, life threatening emergency, suicidal or homicidal thoughts you must seek medical attention immediately by calling 911 or calling your MD immediately  if symptoms less severe.  You must read complete instructions/literature along with all the possible adverse reactions/side effects for all the Medicines you take and that have been prescribed to you. Take any new Medicines after you have completely understood and accpet all the possible adverse reactions/side effects.   Do not drive when taking Pain medications or sleeping medications (Benzodaizepines)  Do not take more than prescribed Pain, Sleep and Anxiety Medications. It is not advisable to combine anxiety,sleep and pain medications without talking with your primary care practitioner  Special Instructions: If you have smoked or chewed Tobacco  in the last 2 yrs please stop smoking, stop any regular Alcohol  and or any Recreational drug use.  Wear Seat belts while driving.  Please note: You were cared for by a hospitalist during your hospital stay. Once you are discharged, your primary care physician will handle any further medical issues. Please note that NO REFILLS for any discharge medications will be authorized once you are discharged, as it is  imperative that you return to your primary care physician (or establish a relationship with a primary care physician if you do not have one) for your post hospital discharge needs so that they can reassess your need for medications and monitor your lab values.  Total Time spent coordinating discharge including counseling, education and face to face time equals greater than 30 minutes.  SignedJeoffrey Massed 09/20/2023 9:44 AM

## 2023-09-20 NOTE — Plan of Care (Signed)
Problem: Education: Goal: Knowledge of General Education information will improve Description: Including pain rating scale, medication(s)/side effects and non-pharmacologic comfort measures Outcome: Progressing   Problem: Clinical Measurements: Goal: Ability to maintain clinical measurements within normal limits will improve Outcome: Progressing Goal: Will remain free from infection Outcome: Progressing Goal: Respiratory complications will improve Outcome: Progressing   Problem: Safety: Goal: Ability to remain free from injury will improve Outcome: Progressing

## 2023-09-25 ENCOUNTER — Emergency Department (HOSPITAL_COMMUNITY)

## 2023-09-25 ENCOUNTER — Inpatient Hospital Stay (HOSPITAL_COMMUNITY)
Admission: EM | Admit: 2023-09-25 | Discharge: 2023-09-30 | DRG: 193 | Disposition: A | Discharge status: Skilled Nursing Facility | Attending: Family Medicine | Admitting: Family Medicine

## 2023-09-25 ENCOUNTER — Other Ambulatory Visit: Payer: Self-pay

## 2023-09-25 DIAGNOSIS — I130 Hypertensive heart and chronic kidney disease with heart failure and stage 1 through stage 4 chronic kidney disease, or unspecified chronic kidney disease: Secondary | ICD-10-CM | POA: Diagnosis present

## 2023-09-25 DIAGNOSIS — R531 Weakness: Secondary | ICD-10-CM

## 2023-09-25 DIAGNOSIS — R4189 Other symptoms and signs involving cognitive functions and awareness: Secondary | ICD-10-CM

## 2023-09-25 DIAGNOSIS — H548 Legal blindness, as defined in USA: Secondary | ICD-10-CM | POA: Diagnosis present

## 2023-09-25 DIAGNOSIS — E871 Hypo-osmolality and hyponatremia: Secondary | ICD-10-CM | POA: Diagnosis present

## 2023-09-25 DIAGNOSIS — E1149 Type 2 diabetes mellitus with other diabetic neurological complication: Secondary | ICD-10-CM | POA: Diagnosis present

## 2023-09-25 DIAGNOSIS — I5032 Chronic diastolic (congestive) heart failure: Secondary | ICD-10-CM | POA: Diagnosis present

## 2023-09-25 DIAGNOSIS — F17210 Nicotine dependence, cigarettes, uncomplicated: Secondary | ICD-10-CM | POA: Diagnosis present

## 2023-09-25 DIAGNOSIS — R262 Difficulty in walking, not elsewhere classified: Secondary | ICD-10-CM | POA: Diagnosis present

## 2023-09-25 DIAGNOSIS — E1122 Type 2 diabetes mellitus with diabetic chronic kidney disease: Secondary | ICD-10-CM | POA: Diagnosis present

## 2023-09-25 DIAGNOSIS — Z1152 Encounter for screening for COVID-19: Secondary | ICD-10-CM

## 2023-09-25 DIAGNOSIS — E1165 Type 2 diabetes mellitus with hyperglycemia: Secondary | ICD-10-CM | POA: Diagnosis not present

## 2023-09-25 DIAGNOSIS — N1831 Chronic kidney disease, stage 3a: Secondary | ICD-10-CM | POA: Diagnosis present

## 2023-09-25 DIAGNOSIS — N179 Acute kidney failure, unspecified: Secondary | ICD-10-CM | POA: Diagnosis not present

## 2023-09-25 DIAGNOSIS — I10 Essential (primary) hypertension: Secondary | ICD-10-CM | POA: Diagnosis not present

## 2023-09-25 DIAGNOSIS — Z825 Family history of asthma and other chronic lower respiratory diseases: Secondary | ICD-10-CM

## 2023-09-25 DIAGNOSIS — J189 Pneumonia, unspecified organism: Principal | ICD-10-CM | POA: Diagnosis present

## 2023-09-25 DIAGNOSIS — E1151 Type 2 diabetes mellitus with diabetic peripheral angiopathy without gangrene: Secondary | ICD-10-CM | POA: Diagnosis present

## 2023-09-25 DIAGNOSIS — E861 Hypovolemia: Secondary | ICD-10-CM | POA: Diagnosis present

## 2023-09-25 DIAGNOSIS — Z888 Allergy status to other drugs, medicaments and biological substances status: Secondary | ICD-10-CM

## 2023-09-25 DIAGNOSIS — J440 Chronic obstructive pulmonary disease with acute lower respiratory infection: Secondary | ICD-10-CM | POA: Diagnosis present

## 2023-09-25 DIAGNOSIS — Z66 Do not resuscitate: Secondary | ICD-10-CM | POA: Diagnosis present

## 2023-09-25 DIAGNOSIS — Z833 Family history of diabetes mellitus: Secondary | ICD-10-CM

## 2023-09-25 DIAGNOSIS — Z955 Presence of coronary angioplasty implant and graft: Secondary | ICD-10-CM

## 2023-09-25 DIAGNOSIS — Z7951 Long term (current) use of inhaled steroids: Secondary | ICD-10-CM

## 2023-09-25 DIAGNOSIS — E785 Hyperlipidemia, unspecified: Secondary | ICD-10-CM | POA: Diagnosis present

## 2023-09-25 DIAGNOSIS — F0392 Unspecified dementia, unspecified severity, with psychotic disturbance: Secondary | ICD-10-CM | POA: Diagnosis present

## 2023-09-25 DIAGNOSIS — I452 Bifascicular block: Secondary | ICD-10-CM | POA: Diagnosis present

## 2023-09-25 DIAGNOSIS — R197 Diarrhea, unspecified: Secondary | ICD-10-CM | POA: Diagnosis present

## 2023-09-25 DIAGNOSIS — F03911 Unspecified dementia, unspecified severity, with agitation: Secondary | ICD-10-CM | POA: Diagnosis present

## 2023-09-25 DIAGNOSIS — Z7401 Bed confinement status: Secondary | ICD-10-CM

## 2023-09-25 DIAGNOSIS — H547 Unspecified visual loss: Secondary | ICD-10-CM

## 2023-09-25 DIAGNOSIS — J441 Chronic obstructive pulmonary disease with (acute) exacerbation: Principal | ICD-10-CM | POA: Diagnosis present

## 2023-09-25 DIAGNOSIS — J9601 Acute respiratory failure with hypoxia: Secondary | ICD-10-CM | POA: Diagnosis present

## 2023-09-25 DIAGNOSIS — H543 Unqualified visual loss, both eyes: Secondary | ICD-10-CM

## 2023-09-25 DIAGNOSIS — Z794 Long term (current) use of insulin: Secondary | ICD-10-CM | POA: Diagnosis not present

## 2023-09-25 DIAGNOSIS — N189 Chronic kidney disease, unspecified: Secondary | ICD-10-CM

## 2023-09-25 DIAGNOSIS — Z7984 Long term (current) use of oral hypoglycemic drugs: Secondary | ICD-10-CM

## 2023-09-25 DIAGNOSIS — R0609 Other forms of dyspnea: Secondary | ICD-10-CM | POA: Diagnosis not present

## 2023-09-25 DIAGNOSIS — Z7902 Long term (current) use of antithrombotics/antiplatelets: Secondary | ICD-10-CM

## 2023-09-25 DIAGNOSIS — I2510 Atherosclerotic heart disease of native coronary artery without angina pectoris: Secondary | ICD-10-CM | POA: Diagnosis present

## 2023-09-25 DIAGNOSIS — Z886 Allergy status to analgesic agent status: Secondary | ICD-10-CM

## 2023-09-25 DIAGNOSIS — M7989 Other specified soft tissue disorders: Secondary | ICD-10-CM | POA: Diagnosis not present

## 2023-09-25 LAB — COMPREHENSIVE METABOLIC PANEL
ALT: 12 U/L (ref 0–44)
AST: 30 U/L (ref 15–41)
Albumin: 2.7 g/dL — ABNORMAL LOW (ref 3.5–5.0)
Alkaline Phosphatase: 117 U/L (ref 38–126)
Anion gap: 13 (ref 5–15)
BUN: 46 mg/dL — ABNORMAL HIGH (ref 8–23)
CO2: 21 mmol/L — ABNORMAL LOW (ref 22–32)
Calcium: 9.1 mg/dL (ref 8.9–10.3)
Chloride: 96 mmol/L — ABNORMAL LOW (ref 98–111)
Creatinine, Ser: 2.26 mg/dL — ABNORMAL HIGH (ref 0.61–1.24)
GFR, Estimated: 27 mL/min — ABNORMAL LOW (ref >60)
Glucose, Bld: 260 mg/dL — ABNORMAL HIGH (ref 70–99)
Potassium: 3.6 mmol/L (ref 3.5–5.1)
Sodium: 130 mmol/L — ABNORMAL LOW (ref 135–145)
Total Bilirubin: 0.6 mg/dL (ref 0.3–1.2)
Total Protein: 7.1 g/dL (ref 6.5–8.1)

## 2023-09-25 LAB — SARS CORONAVIRUS 2 BY RT PCR: SARS Coronavirus 2 by RT PCR: NEGATIVE

## 2023-09-25 LAB — I-STAT VENOUS BLOOD GAS, ED
Acid-base deficit: 3 mmol/L — ABNORMAL HIGH (ref 0.0–2.0)
Bicarbonate: 23.8 mmol/L (ref 20.0–28.0)
Calcium, Ion: 1.21 mmol/L (ref 1.15–1.40)
HCT: 34 % — ABNORMAL LOW (ref 39.0–52.0)
Hemoglobin: 11.6 g/dL — ABNORMAL LOW (ref 13.0–17.0)
O2 Saturation: 49 %
Potassium: 3.6 mmol/L (ref 3.5–5.1)
Sodium: 133 mmol/L — ABNORMAL LOW (ref 135–145)
TCO2: 25 mmol/L (ref 22–32)
pCO2, Ven: 48.1 mm[Hg] (ref 44–60)
pH, Ven: 7.303 (ref 7.25–7.43)
pO2, Ven: 29 mm[Hg] — CL (ref 32–45)

## 2023-09-25 LAB — BRAIN NATRIURETIC PEPTIDE: B Natriuretic Peptide: 93.7 pg/mL (ref 0.0–100.0)

## 2023-09-25 LAB — CBC WITH DIFFERENTIAL/PLATELET
Abs Immature Granulocytes: 0.08 10*3/uL — ABNORMAL HIGH (ref 0.00–0.07)
Basophils Absolute: 0 10*3/uL (ref 0.0–0.1)
Basophils Relative: 0 %
Eosinophils Absolute: 0.2 10*3/uL (ref 0.0–0.5)
Eosinophils Relative: 2 %
HCT: 32.9 % — ABNORMAL LOW (ref 39.0–52.0)
Hemoglobin: 10.5 g/dL — ABNORMAL LOW (ref 13.0–17.0)
Immature Granulocytes: 1 %
Lymphocytes Relative: 6 %
Lymphs Abs: 0.8 10*3/uL (ref 0.7–4.0)
MCH: 27.8 pg (ref 26.0–34.0)
MCHC: 31.9 g/dL (ref 30.0–36.0)
MCV: 87 fL (ref 80.0–100.0)
Monocytes Absolute: 1.5 10*3/uL — ABNORMAL HIGH (ref 0.1–1.0)
Monocytes Relative: 12 %
Neutro Abs: 9.5 10*3/uL — ABNORMAL HIGH (ref 1.7–7.7)
Neutrophils Relative %: 79 %
Platelets: 247 10*3/uL (ref 150–400)
RBC: 3.78 MIL/uL — ABNORMAL LOW (ref 4.22–5.81)
RDW: 13 % (ref 11.5–15.5)
WBC: 12.1 10*3/uL — ABNORMAL HIGH (ref 4.0–10.5)
nRBC: 0 % (ref 0.0–0.2)

## 2023-09-25 LAB — HEMOGLOBIN A1C
Hgb A1c MFr Bld: 8.7 % — ABNORMAL HIGH (ref 4.8–5.6)
Mean Plasma Glucose: 202.99 mg/dL

## 2023-09-25 LAB — TROPONIN I (HIGH SENSITIVITY)
Troponin I (High Sensitivity): 28 ng/L — ABNORMAL HIGH (ref <18)
Troponin I (High Sensitivity): 35 ng/L — ABNORMAL HIGH (ref <18)

## 2023-09-25 LAB — D-DIMER, QUANTITATIVE: D-Dimer, Quant: 7.72 ug{FEU}/mL — ABNORMAL HIGH (ref 0.00–0.50)

## 2023-09-25 LAB — CBG MONITORING, ED: Glucose-Capillary: 384 mg/dL — ABNORMAL HIGH (ref 70–99)

## 2023-09-25 MED ORDER — SODIUM CHLORIDE 0.9 % IV SOLN
1.0000 g | INTRAVENOUS | Status: DC
  Administered 2023-09-26 – 2023-09-27 (×2): 1 g via INTRAVENOUS
  Filled 2023-09-25 (×2): qty 10

## 2023-09-25 MED ORDER — LACTATED RINGERS IV BOLUS
500.0000 mL | Freq: Once | INTRAVENOUS | Status: AC
  Administered 2023-09-25: 500 mL via INTRAVENOUS

## 2023-09-25 MED ORDER — ENOXAPARIN SODIUM 30 MG/0.3ML IJ SOSY
30.0000 mg | PREFILLED_SYRINGE | INTRAMUSCULAR | Status: DC
  Administered 2023-09-25 – 2023-09-29 (×5): 30 mg via SUBCUTANEOUS
  Filled 2023-09-25 (×5): qty 0.3

## 2023-09-25 MED ORDER — PANTOPRAZOLE SODIUM 40 MG PO TBEC
40.0000 mg | DELAYED_RELEASE_TABLET | Freq: Every day | ORAL | Status: DC
Start: 2023-09-26 — End: 2023-10-01
  Administered 2023-09-26 – 2023-09-30 (×5): 40 mg via ORAL
  Filled 2023-09-25 (×5): qty 1

## 2023-09-25 MED ORDER — ACETAMINOPHEN 325 MG PO TABS
650.0000 mg | ORAL_TABLET | Freq: Four times a day (QID) | ORAL | Status: DC | PRN
  Administered 2023-09-26: 650 mg via ORAL
  Filled 2023-09-25: qty 2

## 2023-09-25 MED ORDER — METHYLPREDNISOLONE SODIUM SUCC 125 MG IJ SOLR
125.0000 mg | Freq: Once | INTRAMUSCULAR | Status: AC
  Administered 2023-09-25: 125 mg via INTRAVENOUS
  Filled 2023-09-25: qty 2

## 2023-09-25 MED ORDER — DONEPEZIL HCL 5 MG PO TABS
5.0000 mg | ORAL_TABLET | Freq: Every day | ORAL | Status: DC
Start: 2023-09-26 — End: 2023-10-01
  Administered 2023-09-26 – 2023-09-30 (×5): 5 mg via ORAL
  Filled 2023-09-25 (×5): qty 1

## 2023-09-25 MED ORDER — AZITHROMYCIN 500 MG IV SOLR
500.0000 mg | Freq: Once | INTRAVENOUS | Status: AC
  Administered 2023-09-25: 500 mg via INTRAVENOUS
  Filled 2023-09-25: qty 5

## 2023-09-25 MED ORDER — IPRATROPIUM-ALBUTEROL 0.5-2.5 (3) MG/3ML IN SOLN
3.0000 mL | Freq: Once | RESPIRATORY_TRACT | Status: AC
  Administered 2023-09-25: 3 mL via RESPIRATORY_TRACT
  Filled 2023-09-25: qty 3

## 2023-09-25 MED ORDER — CLOPIDOGREL BISULFATE 75 MG PO TABS
75.0000 mg | ORAL_TABLET | Freq: Every day | ORAL | Status: DC
  Administered 2023-09-26 – 2023-09-30 (×5): 75 mg via ORAL
  Filled 2023-09-25 (×5): qty 1

## 2023-09-25 MED ORDER — CEFTRIAXONE SODIUM 1 G IJ SOLR
1.0000 g | Freq: Once | INTRAMUSCULAR | Status: AC
  Administered 2023-09-25: 1 g via INTRAVENOUS
  Filled 2023-09-25: qty 10

## 2023-09-25 MED ORDER — IPRATROPIUM-ALBUTEROL 0.5-2.5 (3) MG/3ML IN SOLN: 3.0000 mL | RESPIRATORY_TRACT | Status: DC | PRN

## 2023-09-25 MED ORDER — VITAMIN D 25 MCG (1000 UNIT) PO TABS
2000.0000 [IU] | ORAL_TABLET | Freq: Every day | ORAL | Status: DC
  Administered 2023-09-26 – 2023-09-30 (×5): 2000 [IU] via ORAL
  Filled 2023-09-25 (×5): qty 2

## 2023-09-25 MED ORDER — ROSUVASTATIN CALCIUM 20 MG PO TABS
20.0000 mg | ORAL_TABLET | Freq: Every day | ORAL | Status: DC
  Administered 2023-09-26 – 2023-09-30 (×5): 20 mg via ORAL
  Filled 2023-09-25 (×5): qty 1

## 2023-09-25 MED ORDER — AMITRIPTYLINE HCL 25 MG PO TABS: 50.0000 mg | ORAL_TABLET | Freq: Every day | ORAL | Status: DC

## 2023-09-25 MED ORDER — HALOPERIDOL LACTATE 5 MG/ML IJ SOLN
2.0000 mg | Freq: Once | INTRAMUSCULAR | Status: AC
  Administered 2023-09-25: 2 mg via INTRAVENOUS
  Filled 2023-09-25: qty 1

## 2023-09-25 MED ORDER — MOMETASONE FURO-FORMOTEROL FUM 200-5 MCG/ACT IN AERO
2.0000 | INHALATION_SPRAY | Freq: Two times a day (BID) | RESPIRATORY_TRACT | Status: DC
  Administered 2023-09-26 – 2023-09-30 (×4): 2 via RESPIRATORY_TRACT
  Filled 2023-09-25 (×3): qty 8.8

## 2023-09-25 MED ORDER — AMLODIPINE BESYLATE 5 MG PO TABS
5.0000 mg | ORAL_TABLET | Freq: Every day | ORAL | Status: DC
  Administered 2023-09-26 – 2023-09-30 (×5): 5 mg via ORAL
  Filled 2023-09-25 (×5): qty 1

## 2023-09-25 MED ORDER — TRAMADOL HCL 50 MG PO TABS
50.0000 mg | ORAL_TABLET | Freq: Two times a day (BID) | ORAL | Status: DC | PRN
  Administered 2023-09-25 – 2023-09-28 (×3): 50 mg via ORAL
  Filled 2023-09-25 (×3): qty 1

## 2023-09-25 MED ORDER — SODIUM CHLORIDE 0.9 % IV SOLN
500.0000 mg | INTRAVENOUS | Status: DC
  Administered 2023-09-26: 500 mg via INTRAVENOUS
  Filled 2023-09-25 (×3): qty 5

## 2023-09-25 MED ORDER — METOPROLOL SUCCINATE ER 100 MG PO TB24
100.0000 mg | ORAL_TABLET | Freq: Every day | ORAL | Status: DC
  Administered 2023-09-26 – 2023-09-30 (×5): 100 mg via ORAL
  Filled 2023-09-25 (×5): qty 1

## 2023-09-25 MED ORDER — INSULIN ASPART 100 UNIT/ML IJ SOLN
0.0000 [IU] | Freq: Three times a day (TID) | INTRAMUSCULAR | Status: DC
  Administered 2023-09-26: 2 [IU] via SUBCUTANEOUS
  Administered 2023-09-26: 9 [IU] via SUBCUTANEOUS
  Administered 2023-09-26: 3 [IU] via SUBCUTANEOUS
  Administered 2023-09-26: 5 [IU] via SUBCUTANEOUS
  Administered 2023-09-27: 1 [IU] via SUBCUTANEOUS
  Administered 2023-09-27: 5 [IU] via SUBCUTANEOUS
  Administered 2023-09-27: 3 [IU] via SUBCUTANEOUS
  Administered 2023-09-27 – 2023-09-28 (×2): 1 [IU] via SUBCUTANEOUS
  Administered 2023-09-28 (×2): 2 [IU] via SUBCUTANEOUS
  Administered 2023-09-28: 5 [IU] via SUBCUTANEOUS
  Administered 2023-09-29 (×2): 2 [IU] via SUBCUTANEOUS
  Administered 2023-09-29 (×2): 3 [IU] via SUBCUTANEOUS
  Administered 2023-09-30: 7 [IU] via SUBCUTANEOUS
  Administered 2023-09-30: 2 [IU] via SUBCUTANEOUS
  Administered 2023-09-30: 9 [IU] via SUBCUTANEOUS
  Administered 2023-09-30: 1 [IU] via SUBCUTANEOUS

## 2023-09-25 MED ORDER — SERTRALINE HCL 50 MG PO TABS
50.0000 mg | ORAL_TABLET | Freq: Every day | ORAL | Status: DC
  Administered 2023-09-26 – 2023-09-30 (×5): 50 mg via ORAL
  Filled 2023-09-25 (×5): qty 1
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
## 2023-09-25 NOTE — Assessment & Plan Note (Signed)
-  baseline is bed-bound and can only ambulate short distances with assistance. Unable to lift LE against gravity.  -PT evaluation

## 2023-09-25 NOTE — ED Notes (Signed)
Patient combative with staff, refusing care, and pulling cardiac monitor cords off. MD is aware.
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
## 2023-09-25 NOTE — Assessment & Plan Note (Signed)
Continue donepezil 

## 2023-09-25 NOTE — Assessment & Plan Note (Signed)
noted 

## 2023-09-25 NOTE — Assessment & Plan Note (Signed)
-  continue amlodipine and metoprolol
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
## 2023-09-25 NOTE — Assessment & Plan Note (Signed)
Place on SSI. °

## 2023-09-25 NOTE — Assessment & Plan Note (Signed)
-  daughter reports diarrhea for the past few days. Creatinine up at 2.26 with baseline around 1.5 -will give 500cc LR bolus and follow creatinine trend tomorrow. He does have LE edema on exam and is also getting new echocardiogram to work up cardiac cause so will be judicious with fluids
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
## 2023-09-26 ENCOUNTER — Inpatient Hospital Stay (HOSPITAL_COMMUNITY)

## 2023-09-26 ENCOUNTER — Encounter (HOSPITAL_COMMUNITY): Payer: Self-pay | Admitting: Family Medicine

## 2023-09-26 DIAGNOSIS — J9601 Acute respiratory failure with hypoxia: Secondary | ICD-10-CM | POA: Diagnosis not present

## 2023-09-26 DIAGNOSIS — R0609 Other forms of dyspnea: Secondary | ICD-10-CM | POA: Diagnosis not present

## 2023-09-26 LAB — ECHOCARDIOGRAM COMPLETE
AR max vel: 2.18 cm2
AV Area VTI: 2.31 cm2
AV Area mean vel: 2.37 cm2
AV Mean grad: 4 mm[Hg]
AV Peak grad: 7.6 mm[Hg]
Ao pk vel: 1.38 m/s
Height: 66 in
S' Lateral: 2.1 cm
Weight: 2768.98 [oz_av]

## 2023-09-26 LAB — CBC
HCT: 31 % — ABNORMAL LOW (ref 39.0–52.0)
Hemoglobin: 10.2 g/dL — ABNORMAL LOW (ref 13.0–17.0)
MCH: 28 pg (ref 26.0–34.0)
MCHC: 32.9 g/dL (ref 30.0–36.0)
MCV: 85.2 fL (ref 80.0–100.0)
Platelets: 225 10*3/uL (ref 150–400)
RBC: 3.64 MIL/uL — ABNORMAL LOW (ref 4.22–5.81)
RDW: 12.9 % (ref 11.5–15.5)
WBC: 7.9 10*3/uL (ref 4.0–10.5)
nRBC: 0 % (ref 0.0–0.2)

## 2023-09-26 LAB — BASIC METABOLIC PANEL
Anion gap: 11 (ref 5–15)
BUN: 41 mg/dL — ABNORMAL HIGH (ref 8–23)
CO2: 22 mmol/L (ref 22–32)
Calcium: 9.2 mg/dL (ref 8.9–10.3)
Chloride: 98 mmol/L (ref 98–111)
Creatinine, Ser: 1.76 mg/dL — ABNORMAL HIGH (ref 0.61–1.24)
GFR, Estimated: 36 mL/min — ABNORMAL LOW (ref >60)
Glucose, Bld: 312 mg/dL — ABNORMAL HIGH (ref 70–99)
Potassium: 4.4 mmol/L (ref 3.5–5.1)
Sodium: 131 mmol/L — ABNORMAL LOW (ref 135–145)

## 2023-09-26 LAB — CBG MONITORING, ED: Glucose-Capillary: 260 mg/dL — ABNORMAL HIGH (ref 70–99)

## 2023-09-26 LAB — GLUCOSE, CAPILLARY
Glucose-Capillary: 211 mg/dL — ABNORMAL HIGH (ref 70–99)
Glucose-Capillary: 169 mg/dL — ABNORMAL HIGH (ref 70–99)
Glucose-Capillary: 94 mg/dL (ref 70–99)

## 2023-09-26 MED ORDER — HALOPERIDOL LACTATE 5 MG/ML IJ SOLN
3.0000 mg | Freq: Once | INTRAMUSCULAR | Status: AC | PRN
  Administered 2023-09-27: 3 mg via INTRAVENOUS
  Filled 2023-09-26: qty 1

## 2023-09-26 MED ORDER — ENSURE ENLIVE PO LIQD
237.0000 mL | Freq: Two times a day (BID) | ORAL | Status: DC
  Administered 2023-09-26 – 2023-09-30 (×10): 237 mL via ORAL

## 2023-09-26 NOTE — ED Notes (Signed)
Patient crying loudly.  States his "knees are hurting."  Requesting something for pain

## 2023-09-26 NOTE — Plan of Care (Signed)

## 2023-09-26 NOTE — ED Notes (Signed)
Patient heard screaming and crying from room.  RN to bedside.  Patient states "where am I?"  Patient reoriented and informed he is in the hospital.  Patient began crying and stated "I don't want to be in the hospital.  Why am I here?"  Patient updated on plan of care.  Repositioned and warm blankets applied.

## 2023-09-26 NOTE — ED Notes (Signed)
Patient resting with eyes closed. Respirations even and unlabored.
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
## 2023-09-27 ENCOUNTER — Inpatient Hospital Stay (HOSPITAL_COMMUNITY)

## 2023-09-27 DIAGNOSIS — J9601 Acute respiratory failure with hypoxia: Secondary | ICD-10-CM | POA: Diagnosis not present

## 2023-09-27 DIAGNOSIS — M7989 Other specified soft tissue disorders: Secondary | ICD-10-CM

## 2023-09-27 LAB — CBC
HCT: 33.4 % — ABNORMAL LOW (ref 39.0–52.0)
Hemoglobin: 10.8 g/dL — ABNORMAL LOW (ref 13.0–17.0)
MCH: 27.7 pg (ref 26.0–34.0)
MCHC: 32.3 g/dL (ref 30.0–36.0)
MCV: 85.6 fL (ref 80.0–100.0)
Platelets: 288 10*3/uL (ref 150–400)
RBC: 3.9 MIL/uL — ABNORMAL LOW (ref 4.22–5.81)
RDW: 13 % (ref 11.5–15.5)
WBC: 12.8 10*3/uL — ABNORMAL HIGH (ref 4.0–10.5)
nRBC: 0 % (ref 0.0–0.2)

## 2023-09-27 LAB — COMPREHENSIVE METABOLIC PANEL
ALT: 20 U/L (ref 0–44)
AST: 65 U/L — ABNORMAL HIGH (ref 15–41)
Albumin: 2.6 g/dL — ABNORMAL LOW (ref 3.5–5.0)
Alkaline Phosphatase: 100 U/L (ref 38–126)
Anion gap: 9 (ref 5–15)
BUN: 39 mg/dL — ABNORMAL HIGH (ref 8–23)
CO2: 22 mmol/L (ref 22–32)
Calcium: 9.1 mg/dL (ref 8.9–10.3)
Chloride: 104 mmol/L (ref 98–111)
Creatinine, Ser: 1.61 mg/dL — ABNORMAL HIGH (ref 0.61–1.24)
GFR, Estimated: 40 mL/min — ABNORMAL LOW (ref >60)
Glucose, Bld: 121 mg/dL — ABNORMAL HIGH (ref 70–99)
Potassium: 4.3 mmol/L (ref 3.5–5.1)
Sodium: 135 mmol/L (ref 135–145)
Total Bilirubin: 0.8 mg/dL (ref 0.3–1.2)
Total Protein: 6.7 g/dL (ref 6.5–8.1)

## 2023-09-27 LAB — GLUCOSE, CAPILLARY
Glucose-Capillary: 122 mg/dL — ABNORMAL HIGH (ref 70–99)
Glucose-Capillary: 216 mg/dL — ABNORMAL HIGH (ref 70–99)
Glucose-Capillary: 132 mg/dL — ABNORMAL HIGH (ref 70–99)
Glucose-Capillary: 259 mg/dL — ABNORMAL HIGH (ref 70–99)

## 2023-09-27 MED ORDER — HALOPERIDOL LACTATE 5 MG/ML IJ SOLN
2.0000 mg | Freq: Once | INTRAMUSCULAR | Status: AC | PRN
  Administered 2023-09-27: 2 mg via INTRAVENOUS
  Filled 2023-09-27: qty 1

## 2023-09-27 MED ORDER — AZITHROMYCIN 250 MG PO TABS
500.0000 mg | ORAL_TABLET | Freq: Every day | ORAL | Status: DC
  Administered 2023-09-27 – 2023-09-28 (×2): 500 mg via ORAL
  Filled 2023-09-27 (×2): qty 2

## 2023-09-27 MED ORDER — TECHNETIUM TO 99M ALBUMIN AGGREGATED
3.9000 | Freq: Once | INTRAVENOUS | Status: AC | PRN
Start: 2023-09-27 — End: 2023-09-27
  Administered 2023-09-27: 3.9 via INTRAVENOUS

## 2023-09-27 MED ORDER — QUETIAPINE FUMARATE 50 MG PO TABS
50.0000 mg | ORAL_TABLET | Freq: Every day | ORAL | Status: DC
  Administered 2023-09-27 – 2023-09-30 (×4): 50 mg via ORAL
  Filled 2023-09-27 (×4): qty 1

## 2023-09-27 MED ORDER — HALOPERIDOL LACTATE 5 MG/ML IJ SOLN
2.0000 mg | Freq: Four times a day (QID) | INTRAMUSCULAR | Status: DC | PRN
  Administered 2023-09-28 (×2): 2 mg via INTRAMUSCULAR
  Filled 2023-09-27 (×3): qty 1

## 2023-09-27 MED ORDER — HALOPERIDOL 1 MG PO TABS
2.0000 mg | ORAL_TABLET | Freq: Four times a day (QID) | ORAL | Status: DC | PRN
  Administered 2023-09-29 (×2): 2 mg via ORAL
  Filled 2023-09-27 (×4): qty 2
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
## 2023-09-27 NOTE — Progress Notes (Signed)
Pt remains agitated, confused, combative at times. Continuous attempts to get OOB without assist-unable to re-orient. Provider on call informed via amion. Requested bedside sitter for safety and something PRN for agitation.

## 2023-09-27 NOTE — Progress Notes (Signed)
[facility] [phone] [facility] hospital liaison note  Referral received for inpatient unit. Attempted to reach daughter in law to discuss referral without success.   At this time patient does not meet criteria for hospice inpatient unit.   Thank you for the opportunity to participate in this patient's care.  Thea Gist, BSN, [employer] hospital liaison [phone]

## 2023-09-27 NOTE — Progress Notes (Signed)
PT Cancellation Note  Patient Details Name: Eddie Graves MRN: [id number] DOB: 01-15-1931   Cancelled Treatment:    Reason Eval/Treat Not Completed: Other (comment) (Pt currently very agitated and security in pt room. Will continue to follow up as able and appropriate.)  Harrel Carina, DPT, CLT  Acute Rehabilitation Services Office: [phone] (Secure chat preferred)   Claudia Desanctis 09/27/2023, 1:21 PM

## 2023-09-27 NOTE — Plan of Care (Signed)
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
## 2023-09-27 NOTE — Progress Notes (Signed)
VASCULAR LAB    Bilateral lower extremity venous duplex has been performed.  See CV proc for preliminary results.   Icela Glymph, RVT 09/27/2023, 2:03 PM

## 2023-09-28 DIAGNOSIS — J9601 Acute respiratory failure with hypoxia: Secondary | ICD-10-CM

## 2023-09-28 LAB — GLUCOSE, CAPILLARY
Glucose-Capillary: 125 mg/dL — ABNORMAL HIGH (ref 70–99)
Glucose-Capillary: 286 mg/dL — ABNORMAL HIGH (ref 70–99)
Glucose-Capillary: 194 mg/dL — ABNORMAL HIGH (ref 70–99)
Glucose-Capillary: 211 mg/dL — ABNORMAL HIGH (ref 70–99)

## 2023-09-28 MED ORDER — DOXYCYCLINE HYCLATE 100 MG PO TABS
100.0000 mg | ORAL_TABLET | Freq: Two times a day (BID) | ORAL | Status: AC
  Administered 2023-09-28 (×2): 100 mg via ORAL
  Filled 2023-09-28 (×2): qty 1

## 2023-09-28 NOTE — Plan of Care (Signed)

## 2023-09-28 NOTE — Progress Notes (Signed)
Eddie Graves with [facility] called to get an update on patient. Dorene Sorrow stated he will come to the hospital and visit with patient.

## 2023-09-28 NOTE — TOC Progression Note (Addendum)
Transition of Care [facility]) - Progression Note    Patient Details  Name: Eddie Graves MRN: [id number] Date of Birth: Sep 11, 1931  Transition of Care [facility]) CM/SW Contact  Delilah Shan, LCSWA Phone Number: 09/28/2023, 11:22 AM  Clinical Narrative:     CSW received consult for possible SNF placement at time of discharge. Due to patients current orientation CSW spoke with patients daughter-n-law Eddie Graves [phone] regarding PT recommendation of SNF placement at time of discharge. Patients daughter-n-law informed CSW patient comes from home with her. Patients daughter-n-law expressed understanding of PT recommendation and is agreeable to SNF placement for patient at time of discharge.Patients daughter-n-law gave permission to revoke hospice services with [facility] for patient to go to rehab at [facility]. Patients daughter-n-law gave CSW permission to fax out initial referral near the [facility] area for possible SNF placement.. CSW discussed insurance authorization process and will provide Medicare SNF ratings list with accepted SNF bed offers when available.  No further questions reported at this time. CSW LVM for Vangie Bicker with [facility]. CSW awaiting call back.CSW to continue to follow and assist with discharge planning needs.   Update- CSW spoke with [facility] and informed them that daughter-n-law request to revoke hospice services for patient. [facility] confirmed they will discharge patient from hospice services today. Passr pending. CSW submitted requested clinicals to  must for review. Patients daughter-n-law requested palliative services to follow patient at SNF.MD informed.MD in agreement with patients daughter-n-law request. Patients daughter-n-law gave CSW permission to maker referral to [facility]. CSW made referral to [facility] with [facility]. CSW will continue to follow.  Patients passr approved [id number].  Update- CSW Lvm for daughter-n-law. CSW awaiting call back to provide SNF bed  offers.  Update- 5:00pm- CSW received call back from patients daughter-n-law. CSW provided snf bed offers. Patients daughter-n-law accepted [facility]. CSW spoke [place] with [facility] who confirmed SNF bed for patient. CSW to follow up with Nelma Rothman tomorrow in regards to patients insurance authorization.  Expected Discharge Plan: Skilled Nursing Facility Barriers to Discharge: Continued Medical Work up  Expected Discharge Plan and Services In-house Referral: Clinical Social Work Discharge Planning Services: CM Consult Post Acute Care Choice: Residential Hospice Bed Living arrangements for the past 2 months: Single Family Home                                       Social Determinants of Health (SDOH) Interventions SDOH Screenings   Food Insecurity: No Food Insecurity (09/26/2023)  Housing: Medium Risk (09/26/2023)  Transportation Needs: No Transportation Needs (09/26/2023)  Utilities: Not At Risk (09/26/2023)  Depression (PHQ2-9): Low Risk  (02/21/2019)  Tobacco Use: High Risk (09/26/2023)    Readmission Risk Interventions    09/27/2023    2:55 PM  Readmission Risk Prevention Plan  Transportation Screening Complete  HRI or Home Care Consult Complete  Social Work Consult for Recovery Care Planning/Counseling Complete  Palliative Care Screening Complete  Medication Review Oceanographer) Referral to Pharmacy
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
## 2023-09-28 NOTE — Progress Notes (Addendum)
RE: Eddie Graves.   Date of Birth: 1931/04/30  Date: 09/28/2023  To Whom It May Concern:  Please be advised that the above-named patient will require a short-term nursing home stay - anticipated 30 days or less for rehabilitation and strengthening. The plan is for return home.
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
## 2023-09-29 DIAGNOSIS — J9601 Acute respiratory failure with hypoxia: Secondary | ICD-10-CM

## 2023-09-29 LAB — GLUCOSE, CAPILLARY
Glucose-Capillary: 172 mg/dL — ABNORMAL HIGH (ref 70–99)
Glucose-Capillary: 181 mg/dL — ABNORMAL HIGH (ref 70–99)
Glucose-Capillary: 205 mg/dL — ABNORMAL HIGH (ref 70–99)
Glucose-Capillary: 229 mg/dL — ABNORMAL HIGH (ref 70–99)

## 2023-09-29 NOTE — Care Management Important Message (Signed)
Important Message  Patient Details  Name: Eddie Graves MRN: [id number] Date of Birth: 1931-10-28   Important Message Given:  Yes - Medicare IM     Sherilyn Banker 09/29/2023, 9:59 AM

## 2023-09-29 NOTE — TOC Progression Note (Addendum)
Transition of Care [facility]) - Progression Note    Patient Details  Name: Eddie Graves MRN: [id number] Date of Birth: [birth year]  [facility] CM/SW Contact  Delilah Shan, LCSWA Phone Number: 09/29/2023, 11:06 AM  Clinical Narrative:     CSW spoke with [place] with [facility] who informed CSW that they need something in writing showing patient was discharged from hospice services yesterday. CSW informed Jen L. With [facility] who plans to email Nelma Rothman with [facility] with information requested. Nelma Rothman then will follow up with CSW with next steps for insurance auth.  Update- 1:20pm-Quandra with [facility] confirmed she received information needed from [facility]. Nelma Rothman informed CSW that CSW can start insurance authorization for patient. Insurance authorization has been started. Navi ID# [id number]. Insurance authorization currently pending.  Expected Discharge Plan: Skilled Nursing Facility Barriers to Discharge: Continued Medical Work up  Expected Discharge Plan and Services In-house Referral: Clinical Social Work Discharge Planning Services: CM Consult Post Acute Care Choice: Residential Hospice Bed Living arrangements for the past 2 months: Single Family Home                                       Social Determinants of Health (SDOH) Interventions SDOH Screenings   Food Insecurity: No Food Insecurity (09/26/2023)  Housing: Medium Risk (09/26/2023)  Transportation Needs: No Transportation Needs (09/26/2023)  Utilities: Not At Risk (09/26/2023)  Depression (PHQ2-9): Low Risk  (02/21/2019)  Tobacco Use: High Risk (09/26/2023)    Readmission Risk Interventions    09/27/2023    2:55 PM  Readmission Risk Prevention Plan  Transportation Screening Complete  HRI or Home Care Consult Complete  Social Work Consult for Recovery Care Planning/Counseling Complete  Palliative Care Screening Complete  Medication Review Oceanographer) Referral to Pharmacy

## 2023-09-29 NOTE — Progress Notes (Signed)
Mobility Specialist Progress Note:   09/29/23 1118  Mobility  Activity Transferred from chair to bed;Ambulated with assistance in room  Level of Assistance +2 (takes two people)  Press photographer wheel walker  Distance Ambulated (ft) 4 ft  Activity Response Tolerated well  Mobility Referral Yes  $Mobility charge 1 Mobility  Mobility Specialist Start Time (ACUTE ONLY) 1107  Mobility Specialist Stop Time (ACUTE ONLY) 1113  Mobility Specialist Time Calculation (min) (ACUTE ONLY) 6 min    Pt received in chair, agreeable to mobility. RN assisted as +2. Asymptomatic throughout w/ no complaints. Pt left in bed with RN present.  Eddie Graves Mobility Specialist Please contact via Special educational needs teacher or Rehab office at [phone]
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
## 2023-09-29 NOTE — Progress Notes (Signed)
PROGRESS NOTE    Eddie Graves  EAV:[id number] DOB: 1931/06/09 DOA: 09/25/2023 PCP: Renaye Rakers, MD   Brief Narrative: Delvecchio Wolbers is a 89 y.o. male with a history of CAD s/p stents, subarachnoid hemorrhage, hypertension, diabetes mellitus, COPD, CKD, chronic diastolic heart failure, hyperlipidemia, blindness.  Patient presented secondary to shortness of breath and was found to have hypoxia secondary mild pneumonia in addition to an AKI from hypovolemia. Patient received antibiotics and IV fluids. Patient is medically stable for discharge.   Assessment and Plan:  Acute respiratory failure with hypoxia Presumed secondary to pneumonia. Initial concern for heart failure which was ruled out. Patient weaned from 2 L/min of supplemental oxygen to room air.  Presumed community acquired pneumonia Concern for possible atypical infection on chest x-ray. Patient treated with Ceftriaxone and Azithromycin.  AKI on CKD stage IIIa Baseline creatinine is around 1.2-1.3. Creatinine of 2.26 on admission with improvement to 1.61 with IV fluids.  Hypovolemic hyponatremia Resolved with IV fluids.  Ambulatory dysfunction Plan to discharge to SNF.  Cognitive impairment -Continue donepezil  Diabetes mellitus type 2 Uncontrolled with hyperglycemia. -Continue SSI  Blindness Noted.  Primary hypertension -Continue amlodipine and metoprolol   DVT prophylaxis: Lovenox Code Status:   Code Status: Limited: Do not attempt resuscitation (DNR) -DNR-LIMITED -Do Not Intubate/DNI  Family Communication: None at bedside Disposition Plan: Medically stable for discharge. Discharge to SNF   Consultants:  None  Procedures:  None  Antimicrobials: Ceftriaxone Azithromycin Doxycycline    Subjective: Patient reports no concerns except needing to have a bowel movement.  Objective: BP 138/80 (BP Location: Right Arm)   Pulse 84   Temp 98.1 F (36.7 C) (Oral)   Resp 19   Ht 5\' 6"  (1.676 m)    Wt 78.5 kg   SpO2 93%   BMI 27.93 kg/m   Examination:  General exam: Appears calm and comfortable Respiratory system: Clear to auscultation. Respiratory effort normal. Cardiovascular system: S1 & S2 heard, RRR. Gastrointestinal system: Abdomen is nondistended, soft and nontender. Normal bowel sounds heard. Central nervous system: Alert and oriented. No focal neurological deficits. Musculoskeletal: LE edema. No calf tenderness Skin: No cyanosis. No rashes Psychiatry: Judgement and insight appear normal. Mood & affect appropriate.    Data Reviewed: I have personally reviewed following labs and imaging studies  CBC Lab Results  Component Value Date   WBC 12.8 (H) 09/27/2023   RBC 3.90 (L) 09/27/2023   HGB 10.8 (L) 09/27/2023   HCT 33.4 (L) 09/27/2023   MCV 85.6 09/27/2023   MCH 27.7 09/27/2023   PLT 288 09/27/2023   MCHC 32.3 09/27/2023   RDW 13.0 09/27/2023   LYMPHSABS 0.8 09/25/2023   MONOABS 1.5 (H) 09/25/2023   EOSABS 0.2 09/25/2023   BASOSABS 0.0 09/25/2023     Last metabolic panel Lab Results  Component Value Date   NA 135 09/27/2023   K 4.3 09/27/2023   CL 104 09/27/2023   CO2 22 09/27/2023   BUN 39 (H) 09/27/2023   CREATININE 1.61 (H) 09/27/2023   GLUCOSE 121 (H) 09/27/2023   GFRNONAA 40 (L) 09/27/2023   GFRAA 33 (L) 07/22/2020   CALCIUM 9.1 09/27/2023   PHOS 3.2 10/01/2020   PROT 6.7 09/27/2023   ALBUMIN 2.6 (L) 09/27/2023   LABGLOB 2.9 03/05/2016   AGRATIO 1.4 03/05/2016   BILITOT 0.8 09/27/2023   ALKPHOS 100 09/27/2023   AST 65 (H) 09/27/2023   ALT 20 09/27/2023   ANIONGAP 9 09/27/2023    GFR: Estimated  Creatinine Clearance: 30.1 mL/min (A) (by C-G formula based on SCr of 1.61 mg/dL (H)).  Recent Results (from the past 240 hour(s))  SARS Coronavirus 2 by RT PCR (hospital order, performed in [facility] hospital lab) *cepheid single result test* Anterior Nasal Swab     Status: None   Collection Time: 09/25/23  9:02 PM   Specimen: Anterior  Nasal Swab  Result Value Ref Range Status   SARS Coronavirus 2 by RT PCR NEGATIVE NEGATIVE Final    Comment: Performed at [facility], [address] [place], [place] [address]      Radiology Studies: No results found.    LOS: 4 days    Jacquelin Hawking, MD [facility] 09/29/2023, 5:15 PM   If 7PM-7AM, please contact night-coverage [email or web address]
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
## 2023-09-29 NOTE — Hospital Course (Signed)
Eddie Graves is a 89 y.o. male with a history of CAD s/p stents, subarachnoid hemorrhage, hypertension, diabetes mellitus, COPD, CKD, chronic diastolic heart failure, hyperlipidemia, blindness.  Patient presented secondary to shortness of breath and was found to have hypoxia secondary mild pneumonia in addition to an AKI from hypovolemia. Patient received antibiotics and IV fluids. Patient is medically stable for discharge.

## 2023-09-30 DIAGNOSIS — J9601 Acute respiratory failure with hypoxia: Secondary | ICD-10-CM | POA: Diagnosis not present

## 2023-09-30 LAB — GLUCOSE, CAPILLARY
Glucose-Capillary: 143 mg/dL — ABNORMAL HIGH (ref 70–99)
Glucose-Capillary: 196 mg/dL — ABNORMAL HIGH (ref 70–99)
Glucose-Capillary: 302 mg/dL — ABNORMAL HIGH (ref 70–99)
Glucose-Capillary: 191 mg/dL — ABNORMAL HIGH (ref 70–99)
Glucose-Capillary: 364 mg/dL — ABNORMAL HIGH (ref 70–99)

## 2023-09-30 MED ORDER — ENSURE ENLIVE PO LIQD: 237.0000 mL | Freq: Two times a day (BID) | ORAL | Status: AC
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
## 2023-09-30 NOTE — Discharge Summary (Signed)
Physician Discharge Summary   Patient: Eddie Graves MRN: [id number] DOB: Oct 30, 1931  Admit date:     09/25/2023  Discharge date: 09/30/23  Discharge Physician: Jacquelin Hawking, MD   PCP: Renaye Rakers, MD   Recommendations at discharge:  PCP follow-up Palliative care referral  Discharge Diagnoses: Principal Problem:   Acute respiratory failure with hypoxia [facility]) Active Problems:   Acute kidney injury superimposed on chronic kidney disease (HCC)   Essential hypertension, benign   Blindness   Uncontrolled type 2 diabetes mellitus with hyperglycemia, with long-term current use of insulin (HCC)   Cognitive impairment   Weakness  Resolved Problems:   * No resolved hospital problems. *  Hospital Course: Eddie Graves is a 89 y.o. male with a history of CAD s/p stents, subarachnoid hemorrhage, hypertension, diabetes mellitus, COPD, CKD, chronic diastolic heart failure, hyperlipidemia, blindness.  Patient presented secondary to shortness of breath and was found to have hypoxia secondary mild pneumonia in addition to an AKI from hypovolemia. Patient received antibiotics and IV fluids. Patient is medically stable for discharge.  Assessment and Plan:  Acute respiratory failure with hypoxia Presumed secondary to pneumonia. Initial concern for heart failure which was ruled out. Patient weaned from 2 L/min of supplemental oxygen to room air.   Presumed community acquired pneumonia Concern for possible atypical infection on chest x-ray. Patient treated with Ceftriaxone and Azithromycin.   AKI on CKD stage IIIa Baseline creatinine is around 1.2-1.3. Creatinine of 2.26 on admission with improvement to 1.61 with IV fluids.   Hypovolemic hyponatremia Resolved with IV fluids.   Ambulatory dysfunction Plan to discharge to SNF.   Cognitive impairment Continue donepezil   Diabetes mellitus type 2 Uncontrolled with hyperglycemia and a hemoglobin A1C of 8.7%. Patient managed with insulin  while inpatient. Recommend to resume home regimen, but may benefit from alternate treatment versus transition to diet focused management secondary to age.   Blindness Noted.   Primary hypertension Continue amlodipine and metoprolol   Consultants: None Procedures performed: None  Disposition: Skilled nursing facility Diet recommendation: Carb modified diet   DISCHARGE MEDICATION: Allergies as of 09/30/2023       Reactions   Flexeril [cyclobenzaprine] Other (See Comments)   delirium   Nsaids Other (See Comments)   Renal failure        Medication List     STOP taking these medications    acetaminophen-codeine 300-30 MG tablet Commonly known as: TYLENOL #3   amitriptyline 50 MG tablet Commonly known as: ELAVIL       TAKE these medications    acetaminophen 500 MG tablet Commonly known as: TYLENOL Take 1,000 mg by mouth as needed for moderate pain.   amLODipine 5 MG tablet Commonly known as: NORVASC Take 5 mg by mouth daily.   clopidogrel 75 MG tablet Commonly known as: PLAVIX Take 1 tablet (75 mg total) by mouth daily with breakfast.   Crestor 20 MG tablet Generic drug: rosuvastatin Take 20 mg by mouth daily. For cholesterol   diclofenac Sodium 1 % Gel Commonly known as: VOLTAREN Apply 1 application  topically 2 (two) times daily as needed (pain).   donepezil 5 MG tablet Commonly known as: ARICEPT Take 5 mg by mouth daily.   feeding supplement Liqd Take 237 mLs by mouth 2 (two) times daily between meals.   glipiZIDE 10 MG 24 hr tablet Commonly known as: GLUCOTROL XL Take 10 mg by mouth daily.   glucose blood test strip Commonly known as: Prodigy No Coding Blood  Physician Discharge Summary   Patient: Eddie Graves MRN: [id number] DOB: Oct 30, 1931  Admit date:     09/25/2023  Discharge date: 09/30/23  Discharge Physician: Jacquelin Hawking, MD   PCP: Renaye Rakers, MD   Recommendations at discharge:  PCP follow-up Palliative care referral  Discharge Diagnoses: Principal Problem:   Acute respiratory failure with hypoxia [facility]) Active Problems:   Acute kidney injury superimposed on chronic kidney disease (HCC)   Essential hypertension, benign   Blindness   Uncontrolled type 2 diabetes mellitus with hyperglycemia, with long-term current use of insulin (HCC)   Cognitive impairment   Weakness  Resolved Problems:   * No resolved hospital problems. *  Hospital Course: Eddie Graves is a 89 y.o. male with a history of CAD s/p stents, subarachnoid hemorrhage, hypertension, diabetes mellitus, COPD, CKD, chronic diastolic heart failure, hyperlipidemia, blindness.  Patient presented secondary to shortness of breath and was found to have hypoxia secondary mild pneumonia in addition to an AKI from hypovolemia. Patient received antibiotics and IV fluids. Patient is medically stable for discharge.  Assessment and Plan:  Acute respiratory failure with hypoxia Presumed secondary to pneumonia. Initial concern for heart failure which was ruled out. Patient weaned from 2 L/min of supplemental oxygen to room air.   Presumed community acquired pneumonia Concern for possible atypical infection on chest x-ray. Patient treated with Ceftriaxone and Azithromycin.   AKI on CKD stage IIIa Baseline creatinine is around 1.2-1.3. Creatinine of 2.26 on admission with improvement to 1.61 with IV fluids.   Hypovolemic hyponatremia Resolved with IV fluids.   Ambulatory dysfunction Plan to discharge to SNF.   Cognitive impairment Continue donepezil   Diabetes mellitus type 2 Uncontrolled with hyperglycemia and a hemoglobin A1C of 8.7%. Patient managed with insulin  while inpatient. Recommend to resume home regimen, but may benefit from alternate treatment versus transition to diet focused management secondary to age.   Blindness Noted.   Primary hypertension Continue amlodipine and metoprolol   Consultants: None Procedures performed: None  Disposition: Skilled nursing facility Diet recommendation: Carb modified diet   DISCHARGE MEDICATION: Allergies as of 09/30/2023       Reactions   Flexeril [cyclobenzaprine] Other (See Comments)   delirium   Nsaids Other (See Comments)   Renal failure        Medication List     STOP taking these medications    acetaminophen-codeine 300-30 MG tablet Commonly known as: TYLENOL #3   amitriptyline 50 MG tablet Commonly known as: ELAVIL       TAKE these medications    acetaminophen 500 MG tablet Commonly known as: TYLENOL Take 1,000 mg by mouth as needed for moderate pain.   amLODipine 5 MG tablet Commonly known as: NORVASC Take 5 mg by mouth daily.   clopidogrel 75 MG tablet Commonly known as: PLAVIX Take 1 tablet (75 mg total) by mouth daily with breakfast.   Crestor 20 MG tablet Generic drug: rosuvastatin Take 20 mg by mouth daily. For cholesterol   diclofenac Sodium 1 % Gel Commonly known as: VOLTAREN Apply 1 application  topically 2 (two) times daily as needed (pain).   donepezil 5 MG tablet Commonly known as: ARICEPT Take 5 mg by mouth daily.   feeding supplement Liqd Take 237 mLs by mouth 2 (two) times daily between meals.   glipiZIDE 10 MG 24 hr tablet Commonly known as: GLUCOTROL XL Take 10 mg by mouth daily.   glucose blood test strip Commonly known as: Prodigy No Coding Blood  Gluc PRODIGY TEST STRIPS, CHECK BLOOD SUGAR TWICE DAILY DX: E11.65, H54.7 PATIENT WITH BLINDNESS AND NEEDS STRIPS WITH READ ALOUD MONITOR   levalbuterol 1.25 MG/3ML nebulizer solution Commonly known as: XOPENEX TAKE 1.25 MG (ONE AMPULE) BY NEBULIZATION EVERY 4 (FOUR) HOURS AS NEEDED  FOR WHEEZING. What changed: See the new instructions.   metoprolol succinate 100 MG 24 hr tablet Commonly known as: TOPROL-XL Take one tablet by mouth once daily for blood pressure   mometasone-formoterol 200-5 MCG/ACT Aero Commonly known as: DULERA Inhale 2 puffs into the lungs 2 (two) times daily. What changed: when to take this   Omega 3 1200 MG Caps Take 1,200 mg by mouth daily. For cholesterol   pantoprazole 40 MG tablet Commonly known as: PROTONIX Take 40 mg by mouth daily.   ProAir HFA 108 (90 Base) MCG/ACT inhaler Generic drug: albuterol INHALE TWO PUFFS INTO THE LUNGS EVERY TWO HOURS IF NEEDED What changed: See the new instructions.   sertraline 50 MG tablet Commonly known as: ZOLOFT TAKE ONE TABLET BY MOUTH ONCE DAILY   Vitamin D3 50 MCG (2000 UT) capsule TAKE 1 CAPSULE (2,000 UNITS TOTAL) BY MOUTH DAILY. FOR VITAMIN DAY REPLETION What changed: See the new instructions.        Contact information for after-discharge care     Destination     [facility] Preferred SNF .   Service: Skilled Nursing Contact information: [address] [place] [address] [phone]                    Discharge Exam: BP 125/64 (BP Location: Left Arm)   Pulse 79   Temp 98.5 F (36.9 C) (Oral)   Resp 19   Ht 5\' 6"  (1.676 m)   Wt 78.5 kg   SpO2 100%   BMI 27.93 kg/m   General exam: Appears calm and comfortable Respiratory system: Mild wheezing. Respiratory effort normal. Cardiovascular system: S1 & S2 heard, RRR. Gastrointestinal system: Abdomen is nondistended, soft and nontender. Normal bowel sounds heard. Central nervous system: Alert and oriented. Psychiatry: Judgement and insight appear normal. Mood & affect appropriate.   Condition at discharge: stable  The results of significant diagnostics from this hospitalization (including imaging, microbiology, ancillary and laboratory) are listed below for reference.    Imaging Studies: VAS [place] LOWER EXTREMITY VENOUS (DVT)  Result Date: 09/27/2023  Lower Venous DVT Study Patient Name:  Eddie Graves  Date of Exam:   09/27/2023 Medical Rec #: [id number]       Accession #:    [id number] Date of Birth: 1931/02/22       Patient Gender: M Patient Age:   89 years Exam Location:  [facility] Procedure:      VAS [place] LOWER EXTREMITY VENOUS (DVT) Referring Phys: Carma Leaven --------------------------------------------------------------------------------  Indications: Edema, and SOB.  Comparison Study: No prior study on file Performing Technologist: Sherren Kerns RVS  Examination Guidelines: A complete evaluation includes B-mode imaging, spectral Doppler, color Doppler, and power Doppler as needed of all accessible portions of each vessel. Bilateral testing is considered an integral part of a complete examination. Limited examinations for reoccurring indications may be performed as noted. The reflux portion of the exam is performed with the patient in reverse Trendelenburg.  +---------+---------------+---------+-----------+----------+--------------+ RIGHT    CompressibilityPhasicitySpontaneityPropertiesThrombus Aging +---------+---------------+---------+-----------+----------+--------------+ CFV      Full           Yes      Yes                                 +---------+---------------+---------+-----------+----------+--------------+  effusion. Improving bibasilar aeration with mild residual ill-defined opacities. No pneumothorax. IMPRESSION: 1. Improving bibasilar aeration with mild residual ill-defined opacities, likely atelectasis. 2. Increased interstitial thickening, may be pulmonary edema or atypical infection. Electronically Signed   By:  Narda Rutherford M.D.   On: 09/27/2023 16:57   ECHOCARDIOGRAM COMPLETE  Result Date: 09/26/2023    ECHOCARDIOGRAM REPORT   Patient Name:   Eddie Graves Date of Exam: 09/26/2023 Medical Rec #:  [id number]      Height:       66.0 in Accession #:    [id number]     Weight:       167.5 lb Date of Birth:  01/21/1931      BSA:          1.855 m Patient Age:    89 years       BP:           142/83 mmHg Patient Gender: M              HR:           78 bpm. Exam Location:  Inpatient Procedure: 2D Echo, Cardiac Doppler and Color Doppler Indications:    Dyspnea  History:        Patient has no prior history of Echocardiogram examinations.  Sonographer:    Darlys Gales Referring Phys: [id number] CHING T TU IMPRESSIONS  1. Left ventricular ejection fraction, by estimation, is 70 to 75%. The left ventricle has hyperdynamic function. The left ventricle has no regional wall motion abnormalities. There is mild left ventricular hypertrophy of the basal and septal segments. Left ventricular diastolic parameters are consistent with Grade I diastolic dysfunction (impaired relaxation).  2. Right ventricular systolic function was not well visualized. The right ventricular size is not well visualized. Tricuspid regurgitation signal is inadequate for assessing PA pressure.  3. The mitral valve is grossly normal. No evidence of mitral valve regurgitation. No evidence of mitral stenosis.  4. The aortic valve was not well visualized. Aortic valve regurgitation is not visualized. No aortic stenosis is present.  5. The inferior vena cava is normal in size with greater than 50% respiratory variability, suggesting right atrial pressure of 3 mmHg. Comparison(s): No prior Echocardiogram. FINDINGS  Left Ventricle: Left ventricular ejection fraction, by estimation, is 70 to 75%. The left ventricle has hyperdynamic function. The left ventricle has no regional wall motion abnormalities. The left ventricular internal cavity size was normal in size. There  is mild left ventricular hypertrophy of the basal and septal segments. Left ventricular diastolic parameters are consistent with Grade I diastolic dysfunction (impaired relaxation). Right Ventricle: The right ventricular size is not well visualized. Right vetricular wall thickness was not well visualized. Right ventricular systolic function was not well visualized. Tricuspid regurgitation signal is inadequate for assessing PA pressure. Left Atrium: Left atrial size was normal in size. Right Atrium: Right atrial size was normal in size. Pericardium: There is no evidence of pericardial effusion. Mitral Valve: The mitral valve is grossly normal. No evidence of mitral valve regurgitation. No evidence of mitral valve stenosis. Tricuspid Valve: The tricuspid valve is not well visualized. Tricuspid valve regurgitation is trivial. No evidence of tricuspid stenosis. Aortic Valve: The aortic valve was not well visualized. Aortic valve regurgitation is not visualized. No aortic stenosis is present. Aortic valve mean gradient measures 4.0 mmHg. Aortic valve peak gradient measures 7.6 mmHg. Aortic valve area, by VTI measures 2.31 cm. Pulmonic Valve: The pulmonic valve was not well visualized. Pulmonic  effusion. Improving bibasilar aeration with mild residual ill-defined opacities. No pneumothorax. IMPRESSION: 1. Improving bibasilar aeration with mild residual ill-defined opacities, likely atelectasis. 2. Increased interstitial thickening, may be pulmonary edema or atypical infection. Electronically Signed   By:  Narda Rutherford M.D.   On: 09/27/2023 16:57   ECHOCARDIOGRAM COMPLETE  Result Date: 09/26/2023    ECHOCARDIOGRAM REPORT   Patient Name:   Eddie Graves Date of Exam: 09/26/2023 Medical Rec #:  [id number]      Height:       66.0 in Accession #:    [id number]     Weight:       167.5 lb Date of Birth:  01/21/1931      BSA:          1.855 m Patient Age:    89 years       BP:           142/83 mmHg Patient Gender: M              HR:           78 bpm. Exam Location:  Inpatient Procedure: 2D Echo, Cardiac Doppler and Color Doppler Indications:    Dyspnea  History:        Patient has no prior history of Echocardiogram examinations.  Sonographer:    Darlys Gales Referring Phys: [id number] CHING T TU IMPRESSIONS  1. Left ventricular ejection fraction, by estimation, is 70 to 75%. The left ventricle has hyperdynamic function. The left ventricle has no regional wall motion abnormalities. There is mild left ventricular hypertrophy of the basal and septal segments. Left ventricular diastolic parameters are consistent with Grade I diastolic dysfunction (impaired relaxation).  2. Right ventricular systolic function was not well visualized. The right ventricular size is not well visualized. Tricuspid regurgitation signal is inadequate for assessing PA pressure.  3. The mitral valve is grossly normal. No evidence of mitral valve regurgitation. No evidence of mitral stenosis.  4. The aortic valve was not well visualized. Aortic valve regurgitation is not visualized. No aortic stenosis is present.  5. The inferior vena cava is normal in size with greater than 50% respiratory variability, suggesting right atrial pressure of 3 mmHg. Comparison(s): No prior Echocardiogram. FINDINGS  Left Ventricle: Left ventricular ejection fraction, by estimation, is 70 to 75%. The left ventricle has hyperdynamic function. The left ventricle has no regional wall motion abnormalities. The left ventricular internal cavity size was normal in size. There  is mild left ventricular hypertrophy of the basal and septal segments. Left ventricular diastolic parameters are consistent with Grade I diastolic dysfunction (impaired relaxation). Right Ventricle: The right ventricular size is not well visualized. Right vetricular wall thickness was not well visualized. Right ventricular systolic function was not well visualized. Tricuspid regurgitation signal is inadequate for assessing PA pressure. Left Atrium: Left atrial size was normal in size. Right Atrium: Right atrial size was normal in size. Pericardium: There is no evidence of pericardial effusion. Mitral Valve: The mitral valve is grossly normal. No evidence of mitral valve regurgitation. No evidence of mitral valve stenosis. Tricuspid Valve: The tricuspid valve is not well visualized. Tricuspid valve regurgitation is trivial. No evidence of tricuspid stenosis. Aortic Valve: The aortic valve was not well visualized. Aortic valve regurgitation is not visualized. No aortic stenosis is present. Aortic valve mean gradient measures 4.0 mmHg. Aortic valve peak gradient measures 7.6 mmHg. Aortic valve area, by VTI measures 2.31 cm. Pulmonic Valve: The pulmonic valve was not well visualized. Pulmonic  Gluc PRODIGY TEST STRIPS, CHECK BLOOD SUGAR TWICE DAILY DX: E11.65, H54.7 PATIENT WITH BLINDNESS AND NEEDS STRIPS WITH READ ALOUD MONITOR   levalbuterol 1.25 MG/3ML nebulizer solution Commonly known as: XOPENEX TAKE 1.25 MG (ONE AMPULE) BY NEBULIZATION EVERY 4 (FOUR) HOURS AS NEEDED  FOR WHEEZING. What changed: See the new instructions.   metoprolol succinate 100 MG 24 hr tablet Commonly known as: TOPROL-XL Take one tablet by mouth once daily for blood pressure   mometasone-formoterol 200-5 MCG/ACT Aero Commonly known as: DULERA Inhale 2 puffs into the lungs 2 (two) times daily. What changed: when to take this   Omega 3 1200 MG Caps Take 1,200 mg by mouth daily. For cholesterol   pantoprazole 40 MG tablet Commonly known as: PROTONIX Take 40 mg by mouth daily.   ProAir HFA 108 (90 Base) MCG/ACT inhaler Generic drug: albuterol INHALE TWO PUFFS INTO THE LUNGS EVERY TWO HOURS IF NEEDED What changed: See the new instructions.   sertraline 50 MG tablet Commonly known as: ZOLOFT TAKE ONE TABLET BY MOUTH ONCE DAILY   Vitamin D3 50 MCG (2000 UT) capsule TAKE 1 CAPSULE (2,000 UNITS TOTAL) BY MOUTH DAILY. FOR VITAMIN DAY REPLETION What changed: See the new instructions.        Contact information for after-discharge care     Destination     [facility] Preferred SNF .   Service: Skilled Nursing Contact information: [address] [place] [address] [phone]                    Discharge Exam: BP 125/64 (BP Location: Left Arm)   Pulse 79   Temp 98.5 F (36.9 C) (Oral)   Resp 19   Ht 5\' 6"  (1.676 m)   Wt 78.5 kg   SpO2 100%   BMI 27.93 kg/m   General exam: Appears calm and comfortable Respiratory system: Mild wheezing. Respiratory effort normal. Cardiovascular system: S1 & S2 heard, RRR. Gastrointestinal system: Abdomen is nondistended, soft and nontender. Normal bowel sounds heard. Central nervous system: Alert and oriented. Psychiatry: Judgement and insight appear normal. Mood & affect appropriate.   Condition at discharge: stable  The results of significant diagnostics from this hospitalization (including imaging, microbiology, ancillary and laboratory) are listed below for reference.    Imaging Studies: VAS [place] LOWER EXTREMITY VENOUS (DVT)  Result Date: 09/27/2023  Lower Venous DVT Study Patient Name:  Eddie Graves  Date of Exam:   09/27/2023 Medical Rec #: [id number]       Accession #:    [id number] Date of Birth: 1931/02/22       Patient Gender: M Patient Age:   89 years Exam Location:  [facility] Procedure:      VAS [place] LOWER EXTREMITY VENOUS (DVT) Referring Phys: Carma Leaven --------------------------------------------------------------------------------  Indications: Edema, and SOB.  Comparison Study: No prior study on file Performing Technologist: Sherren Kerns RVS  Examination Guidelines: A complete evaluation includes B-mode imaging, spectral Doppler, color Doppler, and power Doppler as needed of all accessible portions of each vessel. Bilateral testing is considered an integral part of a complete examination. Limited examinations for reoccurring indications may be performed as noted. The reflux portion of the exam is performed with the patient in reverse Trendelenburg.  +---------+---------------+---------+-----------+----------+--------------+ RIGHT    CompressibilityPhasicitySpontaneityPropertiesThrombus Aging +---------+---------------+---------+-----------+----------+--------------+ CFV      Full           Yes      Yes                                 +---------+---------------+---------+-----------+----------+--------------+  Gluc PRODIGY TEST STRIPS, CHECK BLOOD SUGAR TWICE DAILY DX: E11.65, H54.7 PATIENT WITH BLINDNESS AND NEEDS STRIPS WITH READ ALOUD MONITOR   levalbuterol 1.25 MG/3ML nebulizer solution Commonly known as: XOPENEX TAKE 1.25 MG (ONE AMPULE) BY NEBULIZATION EVERY 4 (FOUR) HOURS AS NEEDED  FOR WHEEZING. What changed: See the new instructions.   metoprolol succinate 100 MG 24 hr tablet Commonly known as: TOPROL-XL Take one tablet by mouth once daily for blood pressure   mometasone-formoterol 200-5 MCG/ACT Aero Commonly known as: DULERA Inhale 2 puffs into the lungs 2 (two) times daily. What changed: when to take this   Omega 3 1200 MG Caps Take 1,200 mg by mouth daily. For cholesterol   pantoprazole 40 MG tablet Commonly known as: PROTONIX Take 40 mg by mouth daily.   ProAir HFA 108 (90 Base) MCG/ACT inhaler Generic drug: albuterol INHALE TWO PUFFS INTO THE LUNGS EVERY TWO HOURS IF NEEDED What changed: See the new instructions.   sertraline 50 MG tablet Commonly known as: ZOLOFT TAKE ONE TABLET BY MOUTH ONCE DAILY   Vitamin D3 50 MCG (2000 UT) capsule TAKE 1 CAPSULE (2,000 UNITS TOTAL) BY MOUTH DAILY. FOR VITAMIN DAY REPLETION What changed: See the new instructions.        Contact information for after-discharge care     Destination     [facility] Preferred SNF .   Service: Skilled Nursing Contact information: [address] [place] [address] [phone]                    Discharge Exam: BP 125/64 (BP Location: Left Arm)   Pulse 79   Temp 98.5 F (36.9 C) (Oral)   Resp 19   Ht 5\' 6"  (1.676 m)   Wt 78.5 kg   SpO2 100%   BMI 27.93 kg/m   General exam: Appears calm and comfortable Respiratory system: Mild wheezing. Respiratory effort normal. Cardiovascular system: S1 & S2 heard, RRR. Gastrointestinal system: Abdomen is nondistended, soft and nontender. Normal bowel sounds heard. Central nervous system: Alert and oriented. Psychiatry: Judgement and insight appear normal. Mood & affect appropriate.   Condition at discharge: stable  The results of significant diagnostics from this hospitalization (including imaging, microbiology, ancillary and laboratory) are listed below for reference.    Imaging Studies: VAS [place] LOWER EXTREMITY VENOUS (DVT)  Result Date: 09/27/2023  Lower Venous DVT Study Patient Name:  Eddie Graves  Date of Exam:   09/27/2023 Medical Rec #: [id number]       Accession #:    [id number] Date of Birth: 1931/02/22       Patient Gender: M Patient Age:   89 years Exam Location:  [facility] Procedure:      VAS [place] LOWER EXTREMITY VENOUS (DVT) Referring Phys: Carma Leaven --------------------------------------------------------------------------------  Indications: Edema, and SOB.  Comparison Study: No prior study on file Performing Technologist: Sherren Kerns RVS  Examination Guidelines: A complete evaluation includes B-mode imaging, spectral Doppler, color Doppler, and power Doppler as needed of all accessible portions of each vessel. Bilateral testing is considered an integral part of a complete examination. Limited examinations for reoccurring indications may be performed as noted. The reflux portion of the exam is performed with the patient in reverse Trendelenburg.  +---------+---------------+---------+-----------+----------+--------------+ RIGHT    CompressibilityPhasicitySpontaneityPropertiesThrombus Aging +---------+---------------+---------+-----------+----------+--------------+ CFV      Full           Yes      Yes                                 +---------+---------------+---------+-----------+----------+--------------+

## 2023-09-30 NOTE — TOC Transition Note (Addendum)
Transition of Care [facility]) - CM/SW Discharge Note   Patient Details  Name: Eddie Graves MRN: [id number] Date of Birth: [birth year]  Transition of Care [facility]) CM/SW Contact:  Delilah Shan, LCSWA Phone Number: 09/30/2023, 2:54 PM   Clinical Narrative:     Patient will DC to: [facility]  Anticipated DC date: 09/30/2023  Family notified: Tejuana  Transport by: Sharin Mons  ?  Per MD patient ready for DC to [facility] with palliative services to follow. RN, patient, patient's family, Misty with [facility],Walter with APS,and facility notified of DC. Discharge Summary sent to facility. RN given number for report [phone] RM#[id number]. DC packet on chart. DNR signed by MD attached to patients DC packet.Ambulance transport requested for patient.  CSW signing off.   Final next level of care: Skilled Nursing Facility Barriers to Discharge: No Barriers Identified   Patient Goals and CMS Choice CMS Medicare.gov Compare Post Acute Care list provided to:: Patient Represenative (must comment) (Patients daughter-n-law) Choice offered to / list presented to :  (Patients daughter-n-law)  Discharge Placement                Patient chooses bed at: [facility] Patient to be transferred to facility by: [facility] Name of family member notified: Tejuana Patient and family notified of of transfer: 09/30/23  Discharge Plan and Services Additional resources added to the After Visit Summary for   In-house Referral: Clinical Social Work Discharge Planning Services: CM Consult Post Acute Care Choice: Residential Hospice Bed                               Social Determinants of Health (SDOH) Interventions SDOH Screenings   Food Insecurity: No Food Insecurity (09/26/2023)  Housing: Medium Risk (09/26/2023)  Transportation Needs: No Transportation Needs (09/26/2023)  Utilities: Not At Risk (09/26/2023)  Depression (PHQ2-9): Low Risk  (02/21/2019)   Tobacco Use: High Risk (09/26/2023)     Readmission Risk Interventions    09/27/2023    2:55 PM  Readmission Risk Prevention Plan  Transportation Screening Complete  HRI or Home Care Consult Complete  Social Work Consult for Recovery Care Planning/Counseling Complete  Palliative Care Screening Complete  Medication Review Oceanographer) Referral to Pharmacy

## 2023-09-30 NOTE — TOC Progression Note (Addendum)
Transition of Care [facility]) - Progression Note    Patient Details  Name: Eddie Graves MRN: [id number] Date of Birth: 1931/08/12  Transition of Care [facility]) CM/SW Contact  Delilah Shan, LCSWA Phone Number: 09/30/2023, 12:55 PM  Clinical Narrative:     CSW notified by patients insurance that patient unable to use navi benefit until Nov. 1st due to patient revoking hospice. Medicare is still financially liable for paying for the month. Patient would have to submit under traditional MCR. CSW informed Nelma Rothman with [facility]. Nelma Rothman is going to notify DON and call CSW with next steps on proceeding with insurance for SNF. CSW informed MD.  Update-2:20pm- CSW [place] who informed CSW that facility can accept patient today for dc under traditional medicare benefit. CSW informed MD.  Expected Discharge Plan: Skilled Nursing Facility Barriers to Discharge: Continued Medical Work up  Expected Discharge Plan and Services In-house Referral: Clinical Social Work Discharge Planning Services: CM Consult Post Acute Care Choice: Residential Hospice Bed Living arrangements for the past 2 months: Single Family Home                                       Social Determinants of Health (SDOH) Interventions SDOH Screenings   Food Insecurity: No Food Insecurity (09/26/2023)  Housing: Medium Risk (09/26/2023)  Transportation Needs: No Transportation Needs (09/26/2023)  Utilities: Not At Risk (09/26/2023)  Depression (PHQ2-9): Low Risk  (02/21/2019)  Tobacco Use: High Risk (09/26/2023)    Readmission Risk Interventions    09/27/2023    2:55 PM  Readmission Risk Prevention Plan  Transportation Screening Complete  HRI or Home Care Consult Complete  Social Work Consult for Recovery Care Planning/Counseling Complete  Palliative Care Screening Complete  Medication Review Oceanographer) Referral to Pharmacy

## 2023-09-30 NOTE — Plan of Care (Signed)

## 2023-09-30 NOTE — Progress Notes (Signed)
PROGRESS NOTE    Eddie Graves  PPI:[id number] DOB: May 22, 1931 DOA: 09/25/2023 PCP: Renaye Rakers, MD   Brief Narrative: Eddie Graves is a 89 y.o. male with a history of CAD s/p stents, subarachnoid hemorrhage, hypertension, diabetes mellitus, COPD, CKD, chronic diastolic heart failure, hyperlipidemia, blindness.  Patient presented secondary to shortness of breath and was found to have hypoxia secondary mild pneumonia in addition to an AKI from hypovolemia. Patient received antibiotics and IV fluids. Patient is medically stable for discharge.   Assessment and Plan:  Acute respiratory failure with hypoxia Presumed secondary to pneumonia. Initial concern for heart failure which was ruled out. Patient weaned from 2 L/min of supplemental oxygen to room air.  Presumed community acquired pneumonia Concern for possible atypical infection on chest x-ray. Patient treated with Ceftriaxone and Azithromycin.  AKI on CKD stage IIIa Baseline creatinine is around 1.2-1.3. Creatinine of 2.26 on admission with improvement to 1.61 with IV fluids.  Hypovolemic hyponatremia Resolved with IV fluids.  Ambulatory dysfunction Plan to discharge to SNF.  Cognitive impairment -Continue donepezil  Diabetes mellitus type 2 Uncontrolled with hyperglycemia. -Continue SSI  Blindness Noted.  Primary hypertension -Continue amlodipine and metoprolol   DVT prophylaxis: Lovenox Code Status:   Code Status: Limited: Do not attempt resuscitation (DNR) -DNR-LIMITED -Do Not Intubate/DNI  Family Communication: None at bedside Disposition Plan: Medically stable for discharge. Discharge to SNF   Consultants:  None  Procedures:  None  Antimicrobials: Ceftriaxone Azithromycin Doxycycline    Subjective: No issues this morning.  Objective: BP 125/64 (BP Location: Left Arm)   Pulse 79   Temp 98.5 F (36.9 C) (Oral)   Resp 19   Ht 5\' 6"  (1.676 m)   Wt 78.5 kg   SpO2 100%   BMI 27.93 kg/m    Examination:  General exam: Appears calm and comfortable   Data Reviewed: I have personally reviewed following labs and imaging studies  CBC Lab Results  Component Value Date   WBC 12.8 (H) 09/27/2023   RBC 3.90 (L) 09/27/2023   HGB 10.8 (L) 09/27/2023   HCT 33.4 (L) 09/27/2023   MCV 85.6 09/27/2023   MCH 27.7 09/27/2023   PLT 288 09/27/2023   MCHC 32.3 09/27/2023   RDW 13.0 09/27/2023   LYMPHSABS 0.8 09/25/2023   MONOABS 1.5 (H) 09/25/2023   EOSABS 0.2 09/25/2023   BASOSABS 0.0 09/25/2023     Last metabolic panel Lab Results  Component Value Date   NA 135 09/27/2023   K 4.3 09/27/2023   CL 104 09/27/2023   CO2 22 09/27/2023   BUN 39 (H) 09/27/2023   CREATININE 1.61 (H) 09/27/2023   GLUCOSE 121 (H) 09/27/2023   GFRNONAA 40 (L) 09/27/2023   GFRAA 33 (L) 07/22/2020   CALCIUM 9.1 09/27/2023   PHOS 3.2 10/01/2020   PROT 6.7 09/27/2023   ALBUMIN 2.6 (L) 09/27/2023   LABGLOB 2.9 03/05/2016   AGRATIO 1.4 03/05/2016   BILITOT 0.8 09/27/2023   ALKPHOS 100 09/27/2023   AST 65 (H) 09/27/2023   ALT 20 09/27/2023   ANIONGAP 9 09/27/2023    GFR: Estimated Creatinine Clearance: 30.1 mL/min (A) (by C-G formula based on SCr of 1.61 mg/dL (H)).  Recent Results (from the past 240 hour(s))  SARS Coronavirus 2 by RT PCR (hospital order, performed in [facility] hospital lab) *cepheid single result test* Anterior Nasal Swab     Status: None   Collection Time: 09/25/23  9:02 PM   Specimen: Anterior Nasal Swab  Result Value Ref Range Status   SARS Coronavirus 2 by RT PCR NEGATIVE NEGATIVE Final    Comment: Performed at [facility], [address] [place], [place] [address]      Radiology Studies: No results found.    LOS: 5 days    Jacquelin Hawking, MD [facility] 09/30/2023, 12:20 PM   If 7PM-7AM, please contact night-coverage [email or web address]

## 2024-08-07 DEATH — deceased
